# Patient Record
Sex: Female | Born: 1950
Health system: Southern US, Community
[De-identification: ages and names within clinical notes are randomized; demographics above are authoritative.]

## PROBLEM LIST (undated history)

## (undated) DIAGNOSIS — N760 Acute vaginitis: Secondary | ICD-10-CM

## (undated) DIAGNOSIS — J209 Acute bronchitis, unspecified: Secondary | ICD-10-CM

## (undated) DIAGNOSIS — K5900 Constipation, unspecified: Secondary | ICD-10-CM

## (undated) DIAGNOSIS — E785 Hyperlipidemia, unspecified: Secondary | ICD-10-CM

## (undated) DIAGNOSIS — R031 Nonspecific low blood-pressure reading: Secondary | ICD-10-CM

## (undated) DIAGNOSIS — G904 Autonomic dysreflexia: Secondary | ICD-10-CM

## (undated) DIAGNOSIS — D649 Anemia, unspecified: Secondary | ICD-10-CM

## (undated) DIAGNOSIS — E669 Obesity, unspecified: Secondary | ICD-10-CM

## (undated) DIAGNOSIS — K599 Functional intestinal disorder, unspecified: Secondary | ICD-10-CM

## (undated) DIAGNOSIS — R51 Headache: Secondary | ICD-10-CM

## (undated) DIAGNOSIS — G8250 Quadriplegia, unspecified: Secondary | ICD-10-CM

## (undated) DIAGNOSIS — Z973 Presence of spectacles and contact lenses: Secondary | ICD-10-CM

## (undated) DIAGNOSIS — H6120 Impacted cerumen, unspecified ear: Secondary | ICD-10-CM

## (undated) DIAGNOSIS — Z87442 Personal history of urinary calculi: Secondary | ICD-10-CM

## (undated) DIAGNOSIS — T8859XA Other complications of anesthesia, initial encounter: Secondary | ICD-10-CM

## (undated) DIAGNOSIS — G4733 Obstructive sleep apnea (adult) (pediatric): Secondary | ICD-10-CM

## (undated) DIAGNOSIS — N319 Neuromuscular dysfunction of bladder, unspecified: Secondary | ICD-10-CM

## (undated) DIAGNOSIS — N950 Postmenopausal bleeding: Secondary | ICD-10-CM

## (undated) DIAGNOSIS — L89159 Pressure ulcer of sacral region, unspecified stage: Secondary | ICD-10-CM

## (undated) DIAGNOSIS — M25519 Pain in unspecified shoulder: Secondary | ICD-10-CM

## (undated) DIAGNOSIS — Z8719 Personal history of other diseases of the digestive system: Secondary | ICD-10-CM

## (undated) DIAGNOSIS — I10 Essential (primary) hypertension: Secondary | ICD-10-CM

## (undated) DIAGNOSIS — M1990 Unspecified osteoarthritis, unspecified site: Secondary | ICD-10-CM

## (undated) DIAGNOSIS — Z0000 Encounter for general adult medical examination without abnormal findings: Secondary | ICD-10-CM

## (undated) DIAGNOSIS — Z8739 Personal history of other diseases of the musculoskeletal system and connective tissue: Secondary | ICD-10-CM

## (undated) DIAGNOSIS — Q288 Other specified congenital malformations of circulatory system: Secondary | ICD-10-CM

## (undated) DIAGNOSIS — T148XXA Other injury of unspecified body region, initial encounter: Secondary | ICD-10-CM

## (undated) DIAGNOSIS — C4490 Unspecified malignant neoplasm of skin, unspecified: Secondary | ICD-10-CM

## (undated) DIAGNOSIS — G8254 Quadriplegia, C5-C7 incomplete: Secondary | ICD-10-CM

## (undated) DIAGNOSIS — I341 Nonrheumatic mitral (valve) prolapse: Secondary | ICD-10-CM

## (undated) DIAGNOSIS — Z9989 Dependence on other enabling machines and devices: Secondary | ICD-10-CM

## (undated) DIAGNOSIS — F329 Major depressive disorder, single episode, unspecified: Secondary | ICD-10-CM

## (undated) DIAGNOSIS — R739 Hyperglycemia, unspecified: Secondary | ICD-10-CM

## (undated) DIAGNOSIS — T4145XA Adverse effect of unspecified anesthetic, initial encounter: Secondary | ICD-10-CM

## (undated) HISTORY — DX: Other injury of unspecified body region, initial encounter: T14.8XXA

## (undated) HISTORY — DX: Pressure ulcer of sacral region, unspecified stage: L89.159

## (undated) HISTORY — DX: Personal history of urinary calculi: Z87.442

## (undated) HISTORY — DX: Impacted cerumen, unspecified ear: H61.20

## (undated) HISTORY — DX: Functional intestinal disorder, unspecified: K59.9

## (undated) HISTORY — DX: Hyperlipidemia, unspecified: E78.5

## (undated) HISTORY — PX: TRANSFER TENDON HAND: SUR1379

## (undated) HISTORY — DX: Essential (primary) hypertension: I10

## (undated) HISTORY — DX: Pain in unspecified shoulder: M25.519

## (undated) HISTORY — PX: BLADDER STONE REMOVAL: SHX568

## (undated) HISTORY — DX: Neuromuscular dysfunction of bladder, unspecified: N31.9

## (undated) HISTORY — DX: Obesity, unspecified: E66.9

## (undated) HISTORY — DX: Encounter for general adult medical examination without abnormal findings: Z00.00

## (undated) HISTORY — DX: Major depressive disorder, single episode, unspecified: F32.9

## (undated) HISTORY — DX: Quadriplegia, C5-C7 incomplete: G82.54

## (undated) HISTORY — DX: Constipation, unspecified: K59.00

## (undated) HISTORY — DX: Acute bronchitis, unspecified: J20.9

## (undated) HISTORY — DX: Quadriplegia, unspecified: G82.50

## (undated) HISTORY — PX: ROTATOR CUFF REPAIR: SHX139

## (undated) HISTORY — DX: Acute vaginitis: N76.0

## (undated) HISTORY — DX: Headache: R51

## (undated) HISTORY — DX: Postmenopausal bleeding: N95.0

## (undated) HISTORY — DX: Unspecified malignant neoplasm of skin, unspecified: C44.90

## (undated) HISTORY — DX: Hyperglycemia, unspecified: R73.9

---

## 1979-06-14 DIAGNOSIS — Q288 Other specified congenital malformations of circulatory system: Secondary | ICD-10-CM

## 1979-06-14 DIAGNOSIS — G8250 Quadriplegia, unspecified: Secondary | ICD-10-CM

## 1979-06-14 HISTORY — DX: Quadriplegia, unspecified: G82.50

## 1979-06-14 HISTORY — PX: OTHER SURGICAL HISTORY: SHX169

## 1979-06-14 HISTORY — DX: Other specified congenital malformations of circulatory system: Q28.8

## 1979-10-23 HISTORY — PX: LAMINECTOMY: SHX219

## 1998-04-07 ENCOUNTER — Encounter: Admission: RE | Admit: 1998-04-07 | Discharge: 1998-07-06 | Payer: Self-pay

## 1998-12-22 ENCOUNTER — Encounter: Admission: RE | Admit: 1998-12-22 | Discharge: 1999-01-11 | Payer: Self-pay

## 1999-01-04 ENCOUNTER — Other Ambulatory Visit: Admission: RE | Admit: 1999-01-04 | Discharge: 1999-01-04 | Payer: Self-pay | Admitting: Urology

## 1999-01-12 ENCOUNTER — Encounter: Admission: RE | Admit: 1999-01-12 | Discharge: 1999-03-18 | Payer: Self-pay

## 1999-06-02 ENCOUNTER — Other Ambulatory Visit: Admission: RE | Admit: 1999-06-02 | Discharge: 1999-06-02 | Payer: Self-pay | Admitting: Urology

## 1999-06-14 HISTORY — PX: ROTATOR CUFF REPAIR: SHX139

## 1999-06-14 HISTORY — PX: TOTAL SHOULDER REPLACEMENT: SUR1217

## 1999-09-14 ENCOUNTER — Encounter: Admission: RE | Admit: 1999-09-14 | Discharge: 1999-10-14 | Payer: Self-pay | Admitting: Orthopedic Surgery

## 1999-12-01 ENCOUNTER — Other Ambulatory Visit: Admission: RE | Admit: 1999-12-01 | Discharge: 1999-12-01 | Payer: Self-pay | Admitting: Urology

## 2000-11-13 ENCOUNTER — Other Ambulatory Visit: Admission: RE | Admit: 2000-11-13 | Discharge: 2000-11-13 | Payer: Self-pay | Admitting: Urology

## 2001-11-16 ENCOUNTER — Other Ambulatory Visit: Admission: RE | Admit: 2001-11-16 | Discharge: 2001-11-16 | Payer: Self-pay | Admitting: Urology

## 2002-11-07 ENCOUNTER — Other Ambulatory Visit: Admission: RE | Admit: 2002-11-07 | Discharge: 2002-11-07 | Payer: Self-pay | Admitting: Urology

## 2003-10-29 ENCOUNTER — Ambulatory Visit (HOSPITAL_COMMUNITY): Admission: RE | Admit: 2003-10-29 | Discharge: 2003-10-29 | Payer: Self-pay | Admitting: Family Medicine

## 2003-11-17 ENCOUNTER — Other Ambulatory Visit: Admission: RE | Admit: 2003-11-17 | Discharge: 2003-11-17 | Payer: Self-pay | Admitting: Urology

## 2004-03-17 ENCOUNTER — Encounter: Admission: RE | Admit: 2004-03-17 | Discharge: 2004-03-17 | Payer: Self-pay | Admitting: Family Medicine

## 2004-12-15 ENCOUNTER — Other Ambulatory Visit: Admission: RE | Admit: 2004-12-15 | Discharge: 2004-12-15 | Payer: Self-pay | Admitting: Urology

## 2005-06-13 HISTORY — PX: SHOULDER ARTHROSCOPY: SHX128

## 2006-01-09 ENCOUNTER — Other Ambulatory Visit: Admission: RE | Admit: 2006-01-09 | Discharge: 2006-01-09 | Payer: Self-pay | Admitting: Urology

## 2007-01-11 ENCOUNTER — Other Ambulatory Visit: Admission: RE | Admit: 2007-01-11 | Discharge: 2007-01-11 | Payer: Self-pay | Admitting: Urology

## 2007-11-27 LAB — CONVERTED CEMR LAB: Pap Smear: NORMAL

## 2008-05-26 ENCOUNTER — Encounter: Payer: Self-pay | Admitting: Internal Medicine

## 2008-07-24 ENCOUNTER — Ambulatory Visit: Payer: Self-pay | Admitting: Internal Medicine

## 2008-07-24 DIAGNOSIS — G8254 Quadriplegia, C5-C7 incomplete: Secondary | ICD-10-CM

## 2008-07-24 HISTORY — DX: Quadriplegia, C5-C7 incomplete: G82.54

## 2008-07-27 DIAGNOSIS — E785 Hyperlipidemia, unspecified: Secondary | ICD-10-CM | POA: Insufficient documentation

## 2008-07-27 DIAGNOSIS — I10 Essential (primary) hypertension: Secondary | ICD-10-CM | POA: Insufficient documentation

## 2008-07-30 ENCOUNTER — Encounter (INDEPENDENT_AMBULATORY_CARE_PROVIDER_SITE_OTHER): Payer: Self-pay | Admitting: *Deleted

## 2008-08-14 ENCOUNTER — Ambulatory Visit: Payer: Self-pay | Admitting: Internal Medicine

## 2008-08-14 LAB — CONVERTED CEMR LAB
ALT: 38 units/L — ABNORMAL HIGH (ref 0–35)
AST: 23 units/L (ref 0–37)
Albumin: 3.6 g/dL (ref 3.5–5.2)
Alkaline Phosphatase: 94 units/L (ref 39–117)
BUN: 10 mg/dL (ref 6–23)
Basophils Absolute: 0 10*3/uL (ref 0.0–0.1)
Basophils Relative: 0.2 % (ref 0.0–3.0)
Bilirubin, Direct: 0.1 mg/dL (ref 0.0–0.3)
CO2: 31 meq/L (ref 19–32)
CRP, High Sensitivity: 17 — ABNORMAL HIGH (ref 0.00–5.00)
Calcium: 8.8 mg/dL (ref 8.4–10.5)
Chloride: 102 meq/L (ref 96–112)
Cholesterol: 186 mg/dL (ref 0–200)
Creatinine, Ser: 0.5 mg/dL (ref 0.4–1.2)
Eosinophils Absolute: 0.1 10*3/uL (ref 0.0–0.7)
Eosinophils Relative: 1.9 % (ref 0.0–5.0)
GFR calc Af Amer: 165 mL/min
GFR calc non Af Amer: 136 mL/min
Glucose, Bld: 100 mg/dL — ABNORMAL HIGH (ref 70–99)
HCT: 41.1 % (ref 36.0–46.0)
HDL: 27.4 mg/dL — ABNORMAL LOW (ref >39.0)
Hemoglobin: 14.2 g/dL (ref 12.0–15.0)
Hgb A1c MFr Bld: 5.8 % (ref 4.6–6.0)
LDL Cholesterol: 137 mg/dL — ABNORMAL HIGH (ref 0–99)
Lymphocytes Relative: 31.1 % (ref 12.0–46.0)
MCHC: 34.6 g/dL (ref 30.0–36.0)
MCV: 81.1 fL (ref 78.0–100.0)
Monocytes Absolute: 0.4 10*3/uL (ref 0.1–1.0)
Monocytes Relative: 5.8 % (ref 3.0–12.0)
Neutro Abs: 3.8 10*3/uL (ref 1.4–7.7)
Neutrophils Relative %: 61 % (ref 43.0–77.0)
Platelets: 211 10*3/uL (ref 150–400)
Potassium: 3.5 meq/L (ref 3.5–5.1)
RBC: 5.07 M/uL (ref 3.87–5.11)
RDW: 13.3 % (ref 11.5–14.6)
Sodium: 139 meq/L (ref 135–145)
TSH: 3.37 microintl units/mL (ref 0.35–5.50)
Total Bilirubin: 0.7 mg/dL (ref 0.3–1.2)
Total CHOL/HDL Ratio: 6.8
Total Protein: 7.5 g/dL (ref 6.0–8.3)
Triglycerides: 106 mg/dL (ref 0–149)
VLDL: 21 mg/dL (ref 0–40)
WBC: 6.2 10*3/uL (ref 4.5–10.5)

## 2008-08-28 ENCOUNTER — Ambulatory Visit: Payer: Self-pay | Admitting: Internal Medicine

## 2008-08-28 DIAGNOSIS — D649 Anemia, unspecified: Secondary | ICD-10-CM | POA: Insufficient documentation

## 2008-09-22 ENCOUNTER — Telehealth: Payer: Self-pay | Admitting: Internal Medicine

## 2008-09-29 ENCOUNTER — Encounter: Payer: Self-pay | Admitting: Internal Medicine

## 2008-10-13 ENCOUNTER — Telehealth: Payer: Self-pay | Admitting: Internal Medicine

## 2008-10-27 ENCOUNTER — Encounter: Admission: RE | Admit: 2008-10-27 | Discharge: 2008-12-09 | Payer: Self-pay | Admitting: Internal Medicine

## 2008-11-17 ENCOUNTER — Encounter: Payer: Self-pay | Admitting: Internal Medicine

## 2008-11-18 ENCOUNTER — Ambulatory Visit: Payer: Self-pay | Admitting: Internal Medicine

## 2008-11-18 LAB — CONVERTED CEMR LAB
ALT: 15 units/L (ref 0–35)
AST: 13 units/L (ref 0–37)
Cholesterol: 115 mg/dL (ref 0–200)
HDL: 32.6 mg/dL — ABNORMAL LOW (ref >39.00)
LDL Cholesterol: 66 mg/dL (ref 0–99)
Total CHOL/HDL Ratio: 4
Triglycerides: 81 mg/dL (ref 0.0–149.0)
VLDL: 16.2 mg/dL (ref 0.0–40.0)

## 2008-11-27 ENCOUNTER — Ambulatory Visit: Payer: Self-pay | Admitting: Internal Medicine

## 2008-12-08 ENCOUNTER — Encounter: Payer: Self-pay | Admitting: Internal Medicine

## 2008-12-08 ENCOUNTER — Telehealth: Payer: Self-pay | Admitting: Internal Medicine

## 2009-01-05 ENCOUNTER — Encounter: Payer: Self-pay | Admitting: Internal Medicine

## 2009-01-06 ENCOUNTER — Encounter: Payer: Self-pay | Admitting: Internal Medicine

## 2009-01-09 ENCOUNTER — Encounter: Payer: Self-pay | Admitting: Internal Medicine

## 2009-01-22 ENCOUNTER — Ambulatory Visit: Payer: Self-pay | Admitting: Internal Medicine

## 2009-01-29 ENCOUNTER — Telehealth: Payer: Self-pay | Admitting: Internal Medicine

## 2009-02-09 ENCOUNTER — Encounter: Payer: Self-pay | Admitting: Internal Medicine

## 2009-04-27 ENCOUNTER — Telehealth: Payer: Self-pay | Admitting: Internal Medicine

## 2009-05-22 ENCOUNTER — Ambulatory Visit: Payer: Self-pay | Admitting: Internal Medicine

## 2009-05-22 LAB — CONVERTED CEMR LAB
BUN: 8 mg/dL (ref 6–23)
CO2: 31 meq/L (ref 19–32)
Calcium: 9.3 mg/dL (ref 8.4–10.5)
Chloride: 103 meq/L (ref 96–112)
Creatinine, Ser: 0.5 mg/dL (ref 0.4–1.2)
GFR calc non Af Amer: 135.38 mL/min (ref >60)
Glucose, Bld: 98 mg/dL (ref 70–99)
Potassium: 4.1 meq/L (ref 3.5–5.1)
Sodium: 141 meq/L (ref 135–145)

## 2009-05-29 ENCOUNTER — Ambulatory Visit: Payer: Self-pay | Admitting: Diagnostic Radiology

## 2009-05-29 ENCOUNTER — Ambulatory Visit: Payer: Self-pay | Admitting: Internal Medicine

## 2009-05-29 ENCOUNTER — Ambulatory Visit (HOSPITAL_BASED_OUTPATIENT_CLINIC_OR_DEPARTMENT_OTHER): Admission: RE | Admit: 2009-05-29 | Discharge: 2009-05-29 | Payer: Self-pay | Admitting: Internal Medicine

## 2009-05-29 DIAGNOSIS — M25559 Pain in unspecified hip: Secondary | ICD-10-CM | POA: Insufficient documentation

## 2009-06-05 ENCOUNTER — Telehealth: Payer: Self-pay | Admitting: Internal Medicine

## 2009-06-10 ENCOUNTER — Telehealth: Payer: Self-pay | Admitting: *Deleted

## 2009-06-13 HISTORY — PX: OTHER SURGICAL HISTORY: SHX169

## 2009-06-17 ENCOUNTER — Telehealth: Payer: Self-pay | Admitting: Internal Medicine

## 2009-06-22 ENCOUNTER — Telehealth: Payer: Self-pay | Admitting: Internal Medicine

## 2009-06-25 ENCOUNTER — Telehealth: Payer: Self-pay | Admitting: Internal Medicine

## 2009-06-25 ENCOUNTER — Ambulatory Visit: Payer: Self-pay | Admitting: Internal Medicine

## 2009-06-25 LAB — CONVERTED CEMR LAB: Total CK: 28 units/L (ref 7–177)

## 2009-08-04 ENCOUNTER — Ambulatory Visit: Payer: Self-pay | Admitting: Internal Medicine

## 2009-08-04 ENCOUNTER — Telehealth: Payer: Self-pay | Admitting: Internal Medicine

## 2009-08-04 DIAGNOSIS — G4700 Insomnia, unspecified: Secondary | ICD-10-CM | POA: Insufficient documentation

## 2009-10-06 ENCOUNTER — Ambulatory Visit: Payer: Self-pay | Admitting: Internal Medicine

## 2009-10-06 DIAGNOSIS — L659 Nonscarring hair loss, unspecified: Secondary | ICD-10-CM | POA: Insufficient documentation

## 2009-10-07 ENCOUNTER — Telehealth (INDEPENDENT_AMBULATORY_CARE_PROVIDER_SITE_OTHER): Payer: Self-pay | Admitting: *Deleted

## 2009-10-13 ENCOUNTER — Telehealth: Payer: Self-pay | Admitting: Internal Medicine

## 2009-10-30 ENCOUNTER — Ambulatory Visit: Payer: Self-pay | Admitting: Internal Medicine

## 2009-10-30 DIAGNOSIS — L259 Unspecified contact dermatitis, unspecified cause: Secondary | ICD-10-CM | POA: Insufficient documentation

## 2009-10-30 DIAGNOSIS — L84 Corns and callosities: Secondary | ICD-10-CM | POA: Insufficient documentation

## 2009-11-12 ENCOUNTER — Encounter: Payer: Self-pay | Admitting: Internal Medicine

## 2009-11-16 ENCOUNTER — Telehealth: Payer: Self-pay | Admitting: Internal Medicine

## 2009-11-25 ENCOUNTER — Telehealth: Payer: Self-pay | Admitting: Internal Medicine

## 2009-11-27 ENCOUNTER — Encounter: Payer: Self-pay | Admitting: Internal Medicine

## 2010-01-11 ENCOUNTER — Encounter: Payer: Self-pay | Admitting: Internal Medicine

## 2010-01-11 LAB — HM MAMMOGRAPHY: HM Mammogram: NORMAL

## 2010-01-12 ENCOUNTER — Encounter: Payer: Self-pay | Admitting: Internal Medicine

## 2010-03-19 ENCOUNTER — Encounter: Payer: Self-pay | Admitting: Internal Medicine

## 2010-04-01 ENCOUNTER — Ambulatory Visit: Payer: Self-pay | Admitting: Internal Medicine

## 2010-04-29 ENCOUNTER — Telehealth: Payer: Self-pay | Admitting: Internal Medicine

## 2010-06-13 HISTORY — PX: HERNIA REPAIR: SHX51

## 2010-06-29 ENCOUNTER — Telehealth: Payer: Self-pay | Admitting: Internal Medicine

## 2010-07-09 ENCOUNTER — Encounter: Payer: Self-pay | Admitting: Internal Medicine

## 2010-07-13 NOTE — Assessment & Plan Note (Signed)
Summary: 2 month follow up/mhf   Vital Signs:  Patient profile:   60 year old [name] Temp:     Alexandra.1 degrees F oral Pulse rate:   70 / minute Resp:     18 per minute BP sitting:   139 / 87  (right arm) Cuff size:   large  Vitals Entered By: Glendell Docker CMA (October 06, 2009 2:23 PM) CC: Rm 3- 2 Month Follow up disease management Is Patient Diabetic? No   Primary Care Provider:  Dondra Spry DO  CC:  Rm 3- 2 Month Follow up disease management.  History of Present Illness: 60 y/o [name] with hx of quadriplegia for follow up. overall doing well. hip pain has improved some she occ uses higher dose of baclofen  int hx: seen by derm.  Dr. Yetta Barre  biopsy of scalp for alopecia done last week 09/22/2009 and she was diagnosed with lichen planopilaris.  follow up was 09/29/2009-treament is clobetasol.   she has been able to maintain wt loss  Allergies: 1)  ! 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  [employer] Sister is primary caregiver Single  Never Smoked Alcohol use-no         Physical Exam  General:  alert, well-developed, and well-nourished.   Lungs:  normal respiratory effort and normal breath sounds.   Heart:  normal rate, regular rhythm, and no gallop.   Extremities:  trace left pedal edema and trace right pedal edema.   Skin:  large patch of focal hair loss.  posterior scalp   Impression & Recommendations:  Problem # 1:  HIP PAIN, LEFT  (ICD-719.45) Assessment Improved still gets intermittent hip pain.  I suggested sister help with leg stretches.  take higher dose of baclofen as needed Her updated medication list for this problem includes:    Baclofen 20 Mg Tabs (Baclofen) .Marland Kitchen... 1/2 to 1  tablet by mouth three times a day    Hydrocodone-acetaminophen 5-500 Mg Tabs (Hydrocodone-acetaminophen) .Marland Kitchen... 1-2 every once daily as needed  Problem # 2:  ALOPECIA (ICD-704.00) Pt diagnosed with lichen planopilaris.  hair loss isolated to left side when her head contacts pillow.  I suggested switching pillows (hypoallergenic).  she has had same foam pillow x 20 yrs  Medications Added to Medication List This Visit: 1)  Clobetasol Propionate 0.05 % Soln (Clobetasol propionate) .... Apply to affected area  once daily  Complete Medication List: 1)  Peg 3350/electrolytes 240 Gm Solr (Peg 3350-kcl-nabcb-nacl-nasulf) .... 2/3 cup  every morning mix with 5 ounces of water take by mouth  once daily 2)  Furosemide 20 Mg Tabs (Furosemide) .... Take 1 tablet by mouth two times a day 3)  Baclofen 20 Mg Tabs (Baclofen) .... 1/2 to 1  tablet by mouth three times a day 4)  Potassium Chloride Crys Cr 20 Meq Cr-tabs (Potassium chloride crys cr) .... 2 tablets by mouth two times a day 5)  Oxybutynin Chloride  5 Mg Tabs (Oxybutynin chloride) .... Take 1 tablet by mouth every morning 6)  One-a-day Womens Formula Tabs (Multiple vitamins-calcium) .... Take 1 tablet by mouth once a day 7)  Fergon 240 (27 Fe) Mg Tabs (Ferrous gluconate) .... Take 1 tablet by mouth every morning 8)  Diazepam 5 Mg Tabs (Diazepam) .... 1/2 tablet by mouth at bedtime as needed 9)  Hydrocodone-acetaminophen 5-500 Mg Tabs (Hydrocodone-acetaminophen) .Marland Kitchen.. 1-2 every once daily as needed 10)  Pravastatin Sodium 40 Mg Tabs (Pravastatin sodium) .... One tablet by mouth every evening 11)  Clobetasol Propionate 0.05 % Soln (Clobetasol propionate) .... Apply to affected area  once  daily  Patient Instructions: 1)  Please schedule a follow-up appointment in 1 year. 2)  BMP prior to visit, ICD-9:  401.9 3)  Hepatic Panel prior to visit, ICD-9:  272.4 4)  Lipid Panel prior to visit, ICD-9:  272.4 5)  TSH prior to visit, ICD-9:  272.4 6)  Please return for lab work one (1) week before your next appointment.  Prescriptions: PRAVASTATIN SODIUM 40 MG TABS (PRAVASTATIN SODIUM) one tablet by mouth every evening  #90 x 3   Entered and Authorized by:   D. Thomos Lemons DO   Signed by:   D. Thomos Lemons DO on 10/06/2009   Method used:   Print then Give to Patient   RxID:   [id number] OXYBUTYNIN CHLORIDE 5 MG TABS (OXYBUTYNIN CHLORIDE) Take 1 tablet by mouth every morning  #90 x 3   Entered and Authorized by:   D. Thomos Lemons DO   Signed by:   D. Thomos Lemons DO on 10/06/2009   Method used:   Print then Give to Patient   RxID:   [id number] POTASSIUM CHLORIDE CRYS CR 20 MEQ CR-TABS (POTASSIUM CHLORIDE CRYS CR) 2 tablets by mouth two times a day  #120 x 3   Entered and Authorized by:   D. Thomos Lemons DO   Signed by:   D. Thomos Lemons DO on 10/06/2009   Method used:   Print then Give to Patient   RxID:   [id number] BACLOFEN 20 MG TABS (BACLOFEN) 1/2 to 1  tablet by mouth three times a day  #90 x 3   Entered and Authorized by:   D. Thomos Lemons DO   Signed by:   D. Thomos Lemons DO on 10/06/2009   Method used:   Print then Give to Patient   RxID:   [id number] FUROSEMIDE 20 MG TABS (FUROSEMIDE) Take 1 tablet by mouth two times a day  #180 x 3   Entered and Authorized by:   D. Thomos Lemons DO   Signed by:   D. Thomos Lemons DO on 10/06/2009   Method used:   Print then Give to Patient   RxID:   [id number]   Current Allergies (reviewed today): ! * BANDAGE ADHESIVE

## 2010-07-13 NOTE — Progress Notes (Signed)
Summary: Medication Refill  Phone Note Refill Request Message from:  Fax from Pharmacy on August 04, 2009 4:46 PM  Refills Requested: Medication #1:  FUROSEMIDE 20 MG TABS Take 1 tablet by mouth two times a day  Medication #2:  POTASSIUM CHLORIDE CRYS CR 20 MEQ CR-TABS 2 tablets by mouth two times a day  Medication #3:  DIAZEPAM 5 MG TABS 1/2 tablet by mouth at bedtime as needed please refill to [facility]   Initial call taken by: Michaelle Copas,  August 04, 2009 4:46 PM  Follow-up for Phone Call        ok to refill x 5 Follow-up by: D. Thomos Lemons DO,  August 04, 2009 4:50 PM  Additional Follow-up for Phone Call Additional follow up Details #1::        Rx called to pharmacy Additional Follow-up by: Glendell Docker CMA,  August 04, 2009 5:05 PM    Prescriptions: DIAZEPAM 5 MG TABS (DIAZEPAM) 1/2 tablet by mouth at bedtime as needed  #15 x 5   Entered by:   Glendell Docker CMA   Authorized by:   D. Thomos Lemons DO   Signed by:   Glendell Docker CMA on 08/04/2009   Method used:   Telephoned to ...       [facility]* (retail)       [address] [place], [place]  [address]       Ph: [phone] or [phone]       Fax: [phone]   RxID:   [id number] POTASSIUM CHLORIDE CRYS CR 20 MEQ CR-TABS (POTASSIUM CHLORIDE CRYS CR) 2 tablets by mouth two times a day  #120 x 5   Entered by:   Glendell Docker CMA   Authorized by:   D. Thomos Lemons DO   Signed by:   Glendell Docker CMA on 08/04/2009   Method used:   Telephoned to ...       [facility]* (retail)       [address] [place], [place]  [address]       Ph: [phone] or [phone]       Fax: [phone]   RxID:   [id number] FUROSEMIDE 20 MG TABS (FUROSEMIDE) Take 1 tablet by mouth two times a day  #180 x 3   Entered by:   Glendell Docker CMA   Authorized by:   D. Thomos Lemons DO   Signed by:   Glendell Docker CMA on 08/04/2009   Method used:   Telephoned to ...       [facility]* (retail)       [address] [place], [place]  [address]       Ph: [phone] or [phone]       Fax: [phone]   RxID:   [id number]

## 2010-07-13 NOTE — Assessment & Plan Note (Signed)
Summary: new to be est.   Vital Signs:  Patient Profile:   60 Years Old Female Temp:     97.8 degrees F oral Pulse rate:   64 / minute Pulse rhythm:   regular Resp:     16 per minute BP sitting:   140 / 100  (right arm) Cuff size:   large  Vitals Entered By: Glendell Docker CMA (July 24, 2008 3:02 PM)             Is Patient Diabetic? No     PCP:  Dondra Spry DO  Chief Complaint:  New Patient .  History of Present Illness: 60 y/o white female with hx of quadriplegia secondary to ruptured AVM in C spine (1981) to establish.   Pt previously followed by Dr. Duaine Dredge.   She has assoc neurogenic bladder and bowel dysfunction.   She was initially cared for by her mother but her sister took over care in 1985-1986.  She has done remarkably well.    She is concerned about stress her sister is under.   Pt's weight is making day to day more difficult.   She denies pressure sores.   She is concerned about progressive weight gain.   She does not follow specific diet.  She is at higher risk for CAD due to quadriplegia.   She was previously prescribed simvastatin but had to stop due to loose stools.   She denies hx of CAD or DM.  We reviewed her current diet.  She does not limit her calories.    Hypertension       This is a 60 year old woman who presents for Hypertensionmgt.  The patient denies lightheadedness and headaches.  The patient denies the following associated symptoms: chest pain.  Compliance with medications (by patient report) has been near 100%.  She does not monitor BP at home.  Medical records reviewed.    Current Allergies (reviewed today): ! 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    Neurogenic bladder    Bowel dysfuntion - constipation and fecal retention    Hx of bladder stones    Mulitple shoulder surgeries - limited ROM of both shoulders    Hyperlipidemia    Hypertension    Obesity  Past Surgical  History:    Laminectomy - AVM 1981    Tendon transfer to right hand 1982-1983    Left shoulder rotator cuff surgery 1996, 1998    Left shoulder replacement 2001    Right shoulder arthroscopy 2008   Family History:    Family History of CAD Female 1st degree relative  (Father had first MI in his early 58's)  He was smoker    Breast cancer - mother (radiation, no chemo)    Colon ca - no  Social History:    Occupation:- Disabled Editor, commissioning.  [employer]    Single    Never Smoked    Alcohol use-no       Risk Factors:  Tobacco use:  never Drug use:  no Caffeine use:  0 drinks per day Alcohol use:  no Exercise:  no  Mammogram History:     Date of Last Mammogram:  11/27/2007    Results:  normal   PAP Smear History:     Date of Last PAP Smear:  11/27/2007    Results:  normal    Review of Systems  The patient complains of weight gain.  The patient denies fever, chest pain, prolonged cough, headaches, abdominal pain, severe indigestion/heartburn, and depression.         All other systems negative.    Physical Exam  General:     alert and overweight-appearing.   Head:     normocephalic and no abnormalities observed.   Eyes:     vision grossly intact, pupils equal, pupils round, and pupils reactive to light.   Ears:     R ear normal and L ear normal.   Mouth:     Oral mucosa and oropharynx without lesions or exudates.  Teeth in good repair. Neck:     supple, no masses, and no carotid bruits.   Lungs:     normal respiratory effort and normal breath sounds.   Heart:     normal rate, regular rhythm, no murmur, and no gallop.   Abdomen:     soft, non-tender, no hepatomegaly, and no splenomegaly.   Msk:     limited ROM of bilateral shoulders Extremities:     trace left pedal edema and trace right pedal edema.   Neurologic:     cranial nerves II-XII intact.   quadriplegia -  she is able operate powered wheel chair    Impression &  Recommendations:  Problem # 1:  QUADRIPLEGIA (ICD-344.00) Hx of c spine ruptured AVM in 1981.   She has neurogenic bladder and bowel dysfunction.  Her sister is having increasing difficulty caring for pt due to increasing weight.   She has difficult daily bowel regimen.    Consider referral to tertiary center to see if sacral nerve stimulator can improve fecal retention.  Arrange home care eval.  Her sister having difficulty managing day to day needs.  Sister also appears to have care giver burn out.    Orders: Misc. Referral (Misc. Ref)   Problem # 2:  HYPERTENSION (ICD-401.9) BP suboptimal.  I advised pt monitor BP at home.   Consider add additional agent (ACE ). Her updated medication list for this problem includes:    Furosemide 40 Mg Tabs (Furosemide) .Marland Kitchen... Take 1 tablet by mouth two times a day  BP today: 140/100   Problem # 3:  HYPERLIPIDEMIA (ICD-272.4) Pt previously prescribed simvastatin but could not tolerate due to loose stools.    Pt is higher risk due to quadriplegia.   We discussed change to pravastatin.   Problem # 4:  OBESITY (ICD-278.00) We discussed dietary strategies.  Complete Medication List: 1)  Colyte 240 Gm Solr (Peg 3350-kcl-nabcb-nacl-nasulf) .... 2/3 cup for bowel program 2)  Furosemide 40 Mg Tabs (Furosemide) .... Take 1 tablet by mouth two times a day 3)  Baclofen 20 Mg Tabs (Baclofen) .... 1/2 tablet by mouth three times a day 4)  Potassium Chloride Crys Cr 20 Meq Cr-tabs (Potassium chloride crys cr) .... 2 tablets by mouth two times a day 5)  Oxybutynin Chloride 5 Mg Tabs (Oxybutynin chloride) .... Take 1 tablet by mouth every morning 6)  One-a-day Womens Formula Tabs (Multiple vitamins-calcium) .... Take 1 tablet by mouth once a day 7)  Fergon 240 (27 Fe) Mg Tabs (Ferrous gluconate) .... Take 1 tablet by mouth every morning 8)  Diazepam 5 Mg Tabs (Diazepam) .... 1/2 tablet by mouth at bedtime as needed 9)  Hydrocodone-acetaminophen 5-500 Mg Tabs  (Hydrocodone-acetaminophen) .Marland Kitchen.. 1-2 every 4-6 hours as needed pain  Other Orders: Influenza A (H1N1)  (U0454) Influenza virus vaccine- pandemic formulation (H1N1) (09811)  Patient Instructions: 1)  Please schedule a follow-up appointment in 1 month. 2)  BMP prior to visit, ICD-9:  272.4 3)  Hepatic Panel prior to visit, ICD-9: 272.4 4)  Lipid Panel prior to visit, ICD-9: 272.4 5)  TSH prior to visit, ICD-9: 272.4 6)  CBC w/ Diff prior to visit, ICD-9: 272.4 7)  HbgA1C prior to visit, ICD-9: 272.4 8)  Please return for lab work one (1) week before your next appointment.  9)  Limit your carbohydrate intake to 20-25 gm per meal.   Increase lean protein intake in AM. 10)  Avoid concentrated sweets. 11)  Lunch - use low sodium meats and use one slice of bread. 12)  Dinner - lean protein and 2 vegetables.    Current Allergies (reviewed today): ! * BANDAGE ADHESIVE   Preventive Care Screening  Last Flu Shot:    Date:  06/03/2008    Results:  given   Last Tetanus Booster:    Date:  06/03/2008    Results:  given   Mammogram:    Date:  11/27/2007    Results:  normal   Pap Smear:    Date:  11/27/2007    Results:  normal   Last Pneumovax:    Date:  12/07/2005    Results:  given   Bone Density:    Date:  10/17/2003    Results:  normal std dev    H1N1 # 1    Vaccine Type: Influenza virus vaccine- pandemic formulation (H1N1)    Site: right deltoid    Mfr: NOVARTIS    Dose: 0.5 ml    Route: IM    Given by: Glendell Docker CMA    Exp. Date: 10/10/2008    Lot #: [id number]    VIS given: 03/13/2009 given July 24, 2008.  Vaccine Consent Questions    Do you have a history of severe allergic reactions to this vaccine? no    Any prior history of allergic reactions to egg and/or gelatin? no    Do you currently have an acute febrile illness? no    Have you ever had a severe reaction to latex? no    Patient is moderately or severely ill? no    Vaccine information given  and explained to patient? yes    Are you currently pregnant? no

## 2010-07-13 NOTE — Progress Notes (Signed)
Summary: Iron Supplement  Phone Note Call from Patient Call back at Home Phone [phone]   Caller: Patient Call For: D. Thomos Lemons DO Summary of Call: patient called and left voice message requesting a return call regarding her Iron, she states her pharmacy informed her the manufacturer will no longer be carrying what she takes and would like to know what Dr Artist Pais advises. Initial call taken by: Glendell Docker CMA,  April 29, 2010 5:01 PM  Follow-up for Phone Call        stop taking iron supplement Follow-up by: D. Thomos Lemons DO,  April 30, 2010 4:30 PM  Additional Follow-up for Phone Call Additional follow up Details #1::        Pt notified and voices understanding. Nicki Guadalajara Fergerson CMA Duncan Dull)  April 30, 2010 4:56 PM

## 2010-07-13 NOTE — Assessment & Plan Note (Signed)
Summary: rash on top of foot/mhf   Vital Signs:  Patient profile:   60 year old female O2 Sat:      97 % on Room air Pulse rate:   67 / minute Pulse rhythm:   regular BP sitting:   130 / 80  (right arm) Cuff size:   large  Vitals Entered By: Glendell Docker CMA (Oct 30, 2009 2:06 PM)  O2 Flow:  Room air CC: Rm  3- Evaluation of Rash on top of feet  Does patient need assistance? Functional Status Social activities Ambulation Wheelchair   Primary Care Provider:  D. Thomos Lemons DO  CC:  Rm  3- Evaluation of Rash on top of feet.  History of Present Illness:  60 y/o white female with hx of quadriplegia c/o rash top of foot.  no fever or chills. similar rash in the past. Dr. Duaine Dredge thought it may be cellulitus.  she wears shoes that do not breathe well  Allergies: 1)  ! 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  [employer] Sister is primary caregiver Single  Never Smoked Alcohol use-no          Physical Exam  General:  alert, well-developed, and well-nourished.   Lungs:  normal respiratory effort and normal breath sounds.   Heart:  normal rate, regular rhythm, and no gallop.   Msk:  small callus right medial foot.  hyperesthesia Skin:  macular rash top of foot   Impression & Recommendations:  Problem # 1:  DERMATITIS (ICD-692.9) I suspect contact dermatitis.  trial of topical lotrisone.  we discussed signs of cellulitis.  rx for keflex  provided.  Problem # 2:  CALLUS, RIGHT FOOT (ICD-700) hx of quadriplegia.  she reports hyperesthesia over right foot.  right callus vs wart has caused irritation for years but too afraid of having surgery.  she would like at least consultation to review tx options.  Orders: Orthopedic Referral (Ortho)  Complete Medication List: 1)  Peg 3350/electrolytes 240 Gm Solr (Peg 3350-kcl-nabcb-nacl-nasulf) .... 5 ounces by mouth once daily as directed 2)  Furosemide 20 Mg Tabs (Furosemide) .... Take 1 tablet by mouth two times a day 3)  Baclofen 20 Mg Tabs (Baclofen) .... 1/2 to 1  tablet by mouth three times a day 4)  Potassium Chloride Crys Cr 20 Meq Cr-tabs (Potassium chloride crys cr) .... 2 tablets by mouth two times a day 5)  Oxybutynin Chloride 5 Mg Tabs (Oxybutynin chloride) .... Take 1 tablet by mouth every morning 6)  One-a-day Womens Formula Tabs (Multiple vitamins-calcium) .... Take 1 tablet by mouth once a day 7)  Fergon 240 (27 Fe) Mg Tabs (Ferrous gluconate) .... Take 1 tablet by mouth every morning 8)  Diazepam 5 Mg Tabs (Diazepam) .... 1/2 tablet by mouth at bedtime as needed 9)  Hydrocodone-acetaminophen 5-500 Mg Tabs (Hydrocodone-acetaminophen) .Marland Kitchen.. 1-2 every once daily as needed 10)  Pravastatin Sodium 40 Mg Tabs (Pravastatin  sodium) .... One tablet by mouth every evening 11)  Clotrimazole-betamethasone 1-0.05 % Crea (Clotrimazole-betamethasone) .... Apply two times a day x 2 weeks 12)  Cephalexin 500 Mg Caps (Cephalexin) .... One by mouth two times a day  Patient Instructions: 1)  Call our office if your symptoms do not  improve or gets worse. 2)  Our office will contact you re:  referral to Dr. Victorino Dike Prescriptions: CEPHALEXIN 500 MG CAPS (CEPHALEXIN) one by mouth two times a day  #14 x 0   Entered and Authorized by:   D. Thomos Lemons DO   Signed by:   D. Thomos Lemons DO on 10/30/2009   Method used:   Print then Give to Patient   RxID:    [id number] CLOTRIMAZOLE-BETAMETHASONE 1-0.05 % CREA (CLOTRIMAZOLE-BETAMETHASONE) apply two times a day x 2 weeks  #30 grams x 1   Entered and Authorized by:   D. Thomos Lemons DO   Signed by:   D. Thomos Lemons DO on 10/30/2009   Method used:   Electronically to        [facility] (retail)       [address]       [place], [place]  [address]       Ph: [phone]       Fax: [phone]   RxID:   [id number]   Current Allergies (reviewed today): ! * BANDAGE ADHESIVE

## 2010-07-13 NOTE — Progress Notes (Signed)
Summary: Leg pain  Phone Note Call from Patient Call back at Home Phone [phone]   Caller: Patient Summary of Call: patient called and left voice message stating she is still having unresolved leg pain. Her muscles in her left leg continues to hurt. She would like to know if is it could be related to her cholesterol medication. Initial call taken by: Glendell Docker CMA,  June 22, 2009 3:42 PM  Follow-up for Phone Call        unlikely but possible.  have pt come in for CPK - use hip pain.  she can hold cholesterol medication x 2 weeks to see if pain gets better Follow-up by: D. Thomos Lemons DO,  June 22, 2009 4:27 PM  Additional Follow-up for Phone Call Additional follow up Details #1::        patient advised per Dr Artist Pais instructions.She states she will go to the [place] location for the blood work Additional Follow-up by: Glendell Docker CMA,  June 22, 2009 4:47 PM

## 2010-07-13 NOTE — Letter (Signed)
Summary: Wheelchair Order/[facility]  Wheelchair Order/[facility]   Imported By: Lanelle Bal 01/14/2009 13:51:43  _____________________________________________________________________  External Attachment:    Type:   Image     Comment:   External Document

## 2010-07-13 NOTE — Progress Notes (Signed)
Summary: refill req  Phone Note Refill Request Message from:  Fax from Pharmacy on June 10, 2009 3:14 PM  Refills Requested: Medication #1:  colyte with flavor p sol ml 8000   Dosage confirmed as above?Dosage Confirmed   Brand Name Necessary? No   Supply Requested: 1 month  Method Requested: Electronic Next Appointment Scheduled: 08-04-09 215 Dr Artist Pais Initial call taken by: Roselle Locus,  June 10, 2009 3:15 PM    Prescriptions: COLYTE 240 GM SOLR (PEG 3350-KCL-NABCB-NACL-NASULF) 2/3 cup for bowel program  #8000 x 1   Entered by:   Romualdo Bolk, CMA (AAMA)   Authorized by:   D. Thomos Lemons DO   Signed by:   Romualdo Bolk, CMA (AAMA) on 06/11/2009   Method used:   Electronically to        [facility]* (retail)       [address] [place], [place]  [address]       Ph: [phone] or [phone]       Fax: [phone]   RxID:   [id number]

## 2010-07-13 NOTE — Miscellaneous (Signed)
Summary: Mammogram  Clinical Lists Changes  Observations: Added new observation of MAMMOGRAM: normal (01/05/2009 10:57)      Preventive Care Screening  Mammogram:    Date:  01/05/2009    Results:  normal

## 2010-07-13 NOTE — Assessment & Plan Note (Signed)
Summary: 2 MONTH FOLLOW UP/MHF   Vital Signs:  Patient profile:   60 year old female O2 Sat:      99 % on Room air Temp:     98.2 degrees F oral Pulse rate:   59 / minute Pulse rhythm:   regular Resp:     16 per minute BP sitting:   140 / 80  (left arm) Cuff size:   large  Vitals Entered By: Glendell Docker CMA (August 04, 2009 2:17 PM)  O2 Flow:  Room air CC: 2 Month Follow up  Is Patient Diabetic? No Comments 2 Month follow up , c/ o unresolvedleft leg cramping, Refill on all medications   Primary Care Provider:  Dondra Spry DO  CC:  2 Month Follow up .  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  she c/o less of hip pain but reports tight sensation of left leg.  symptoms still worse as night.  she also c/o insomnia over last few months.  no change in habits  denies mood changes  Preventive Screening-Counseling & Management  Alcohol-Tobacco     Smoking Status: never  Allergies: 1)  ! * Bandage Adhesive  Past History:  Past Surgical History: Laminectomy - AVM 1981 Tendon transfer to right hand 1982-1983 Left shoulder rotator cuff surgery 1996, 1998 Left shoulder replacement 2001   Right shoulder arthroscopy 2008   Family History: Family History of CAD Female 1st degree relative  (Father had first MI in his early 63's)  He was smoker Breast cancer - mother (radiation, no chemo) Colon ca - no         Social History: Occupation:- Disabled Editor, commissioning.  [employer] Sister is primary caregiver Single Never Smoked Alcohol use-no         Physical Exam  General:  alert, well-developed, and well-nourished.   Lungs:  normal respiratory effort and normal breath sounds.   Heart:  normal rate, regular rhythm, and no gallop.   Msk:  no leg or hip tenderness.  mild increase in muscle tone of left leg compared to right.   Impression &  Recommendations:  Problem # 1:  HIP PAIN, LEFT (ICD-719.45) Pt feels like left leg is tight.  prev x ray is normal.  she has some spacticity.  increase baclofen dose at bedtime. If no improvement, consider MRI of L Spine   Her updated medication list for this problem includes:    Baclofen 20 Mg Tabs (Baclofen) .Marland Kitchen... 1/2 to 1  tablet by mouth three times a day    Hydrocodone-acetaminophen 5-500 Mg Tabs (Hydrocodone-acetaminophen) .Marland Kitchen... 1-2 every once daily as needed  Problem # 2:  INSOMNIA (ICD-780.52) Pt with intermittent insomnia.  we discussed sleep hygiene changes.  I encourage exposure to direct sun light for 15-20 mins. higher dose of baclofen may also help with help with sleep  Complete Medication List: 1)  Peg 3350/electrolytes 240 Gm Solr (Peg 3350-kcl-nabcb-nacl-nasulf) .... 2/3 cup  every morning mix with 5 ounces of water take by mouth  once daily 2)  Furosemide 20 Mg Tabs (Furosemide) .... Take 1 tablet by mouth two times a day 3)  Baclofen 20 Mg Tabs (Baclofen) .... 1/2 to 1  tablet by mouth three times a day 4)  Potassium Chloride Crys Cr 20 Meq Cr-tabs (Potassium chloride crys cr) .... 2 tablets by mouth two times a day 5)  Oxybutynin Chloride 5 Mg Tabs (Oxybutynin chloride) .... Take 1 tablet by mouth every morning 6)  One-a-day Womens Formula Tabs (Multiple vitamins-calcium) .... Take 1 tablet by mouth once a day 7)  Fergon 240 (27 Fe) Mg Tabs (Ferrous gluconate) .... Take 1 tablet by mouth every morning 8)  Diazepam 5 Mg Tabs (Diazepam) .... 1/2 tablet by mouth at bedtime as needed 9)  Hydrocodone-acetaminophen 5-500 Mg Tabs (Hydrocodone-acetaminophen) .Marland Kitchen.. 1-2 every once daily as needed 10)  Pravastatin Sodium 40 Mg Tabs (Pravastatin sodium) .... One by mouth qpm 11)  Clobetasol Propionate 0.05 % Soln (Clobetasol propionate) .... Apply to affected area two times a day as needed  Patient Instructions: 1)  Please schedule a follow-up appointment in 2  months. Prescriptions: HYDROCODONE-ACETAMINOPHEN 5-500 MG TABS (HYDROCODONE-ACETAMINOPHEN) 1-2 every once daily as needed  #60 x 0   Entered and Authorized by:   D. Thomos Lemons DO   Signed by:   D. Thomos Lemons DO on 08/04/2009   Method used:   Print then Give to Patient   RxID:   [id number] BACLOFEN 20 MG TABS (BACLOFEN) 1/2 to 1  tablet by mouth three times a day  #90 x 3   Entered and Authorized by:   D. Thomos Lemons DO   Signed by:   D. Thomos Lemons DO on 08/04/2009   Method used:   Electronically to        [facility]* (retail)       [address] [place], [place]  [address]       Ph: [phone] or [phone]       Fax: [phone]   RxID:   [id number]

## 2010-07-13 NOTE — Progress Notes (Signed)
  Phone Note Other Incoming   Summary of Call: Received letter from pt re: need for new powered wheel chair.   Please set up PT referral Initial call taken by: D. Thomos Lemons DO,  September 22, 2008 12:55 PM  Follow-up for Phone Call        Request fax  to [facility]     Follow up    fax rec'd   Follow-up by: Darral Dash,  September 25, 2008 9:36 AM

## 2010-07-13 NOTE — Progress Notes (Signed)
Summary: 3350 PEG FYI  Phone Note Outgoing Call   Call placed by: Mervin Kung, CMA Call placed to: Patient Summary of Call: I called pt to clarify which bowel prep she has used in the past. Pt stated she has used Colyte but her insurance no longer covers it.  She states she picked up the 3350 PEG that we sent in. The pharmacy said this is a generic equivalent for colyte.  Pt. wanted to know why it costed more this time and it was due to the quantity we sent in. Pharmacy states they gave her 4 jugs this time and have been giving her 2.  Mervin Kung CMA  Oct 13, 2009 2:59 PM   Follow-up for Phone Call        ok to change to 2 jugs if pt wishes Follow-up by: D. Thomos Lemons DO,  Oct 13, 2009 5:35 PM  Additional Follow-up for Phone Call Additional follow up Details #1::        Called pharmacist to change quantity to 2 jugs per pt. as I was told this would be a cheaper copay for the pt.  The pharmacist today told me that it is actually cheaper for the pt. to get 4 jugs for $30 copay instead 2 jugs @ $40.  She states they had been undercharging the pt. in the past.  Advised pt. per pharmacy instructions and asked pt. to contact pharmacy if she has any questions.  Mervin Kung CMA  Oct 14, 2009 9:47 AM

## 2010-07-13 NOTE — Progress Notes (Signed)
Summary: Pre-Op  Phone Note Call from Patient Call back at [phone]   Caller: Patient Call For: D. Thomos Lemons DO Complaint: Urinary/GYN Problems Summary of Call: patient called and left voice message stating she was seen by Dr Artist Pais and at her last office visit she discussed a problem she was having with her foot. She states she has since seen Dr Victorino Dike about the bone on her foot and he advises surgery. Her message states she will need surgical clearance from Dr Artist Pais and would like to know if Dr Artist Pais could send clearance , or if  she will need to return for the surgical clearance Initial call taken by: Glendell Docker CMA,  November 16, 2009 11:22 AM  Follow-up for Phone Call        call was returned to patient, and she states that she has mitral valve prolapse. She mentioned that she does not remember discussing this with Dr Artist Pais, but she has not had any problems with it in the past. She also states that has been given antibiotics in the past for dental procedures, but has no longer had the need to have that done Follow-up by: Glendell Docker CMA,  November 16, 2009 11:27 AM  Additional Follow-up for Phone Call Additional follow up Details #1::        she can proceed with surgery. does dr. Victorino Dike want pre op form completed?  Additional Follow-up by: D. Thomos Lemons DO,  November 16, 2009 1:19 PM    Additional Follow-up for Phone Call Additional follow up Details #2::    patient advised per Dr Artist Pais, she state the does need completion and she will drop the form off to the office  Form was received and forwarded to Dr. Artist Pais for completion.  Mervin Kung CMA  November 16, 2009 4:25 PM  Follow-up by: Glendell Docker CMA,  November 16, 2009 2:07 PM

## 2010-07-13 NOTE — Progress Notes (Signed)
  Phone Note Outgoing Call   Summary of Call: call pt - muscle enzyme blood test is normal Initial call taken by: D. Thomos Lemons DO,  June 25, 2009 6:15 PM  Follow-up for Phone Call        informed pt. that her muscke enzyme test is normal Follow-up by: Michaelle Copas,  June 26, 2009 8:57 AM

## 2010-07-13 NOTE — Assessment & Plan Note (Signed)
Summary: 6 MONTHS ROV-CH   Vital Signs:  Patient profile:   60 year old female O2 Sat:      100 % on Room air Temp:     97.7 degrees F oral Pulse rate:   66 / minute Pulse rhythm:   regular Resp:     18 per minute BP sitting:   158 / 90  (right arm) Cuff size:   large  Vitals Entered By: Glendell Docker CMA (May 29, 2009 2:11 PM)  O2 Flow:  Room air  Primary Care Provider:  D. Thomos Lemons DO  CC:  6 Month Follow up.  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  she doesn't have normal sensation but feels like something is "not right" in her left hip.  she reports remote "injury" when her legs were positioned in stirrups and something went wrong.  no f/u because of quadriplegia.  it feels like a bone is sticking further out on left side.     Preventive Screening-Counseling & Management  Alcohol-Tobacco     Smoking Status: never  Allergies: 1)  ! 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   she has new motorized wheel chair  Physical Exam  General:  alert, well-developed, and well-nourished.   Lungs:  normal respiratory effort and normal breath  sounds.   Heart:  normal rate, regular rhythm, and no gallop.   Msk:  no obvious palpable abnormality Neurologic:  cranial nerves II-XII intact and gait normal.     Impression & Recommendations:  Problem # 1:  HIP PAIN, LEFT (ICD-719.45) Pt with left hip discomfort.  she describes bone sticking out.  She may have torn her intracapsular ligament in her remote past.  check x ray.   I suggested slight repositioning her seat cushion.  if persistent symptoms, refer to ortho. Her updated medication list for this problem includes:    Baclofen 20 Mg Tabs (Baclofen) .Marland Kitchen... 1/2 tablet by mouth three times a day    Hydrocodone-acetaminophen 5-500 Mg Tabs (Hydrocodone-acetaminophen) .Marland Kitchen... 1-2 every 4-6 hours as needed pain  Orders: T-Hip Comp Left Min 2-views (73510TC)  Complete Medication List: 1)  Colyte 240 Gm Solr (Peg 3350-kcl-nabcb-nacl-nasulf) .... 2/3 cup for bowel program 2)  Furosemide 40 Mg Tabs (Furosemide) .... Take 1/2  tablet by mouth two times a day 3)  Baclofen 20 Mg Tabs (Baclofen) .... 1/2 tablet by mouth three times a day 4)  Potassium Chloride Crys Cr 20 Meq Cr-tabs (Potassium chloride crys cr) .... 2 tablets by mouth two times a day 5)  Oxybutynin Chloride 5 Mg Tabs (Oxybutynin  chloride) .... Take 1 tablet by mouth every morning 6)  One-a-day Womens Formula Tabs (Multiple vitamins-calcium) .... Take 1 tablet by mouth once a day 7)  Fergon 240 (27 Fe) Mg Tabs (Ferrous gluconate) .... Take 1 tablet by mouth every morning 8)  Diazepam 5 Mg Tabs (Diazepam) .... 1/2 tablet by mouth at bedtime as needed 9)  Hydrocodone-acetaminophen 5-500 Mg Tabs (Hydrocodone-acetaminophen) .Marland Kitchen.. 1-2 every 4-6 hours as needed pain 10)  Pravastatin Sodium 40 Mg Tabs (Pravastatin sodium) .... One by mouth qpm  Patient Instructions: 1)  Please schedule a follow-up appointment in 2 months. 2)  Call our office if your symptoms do not  improve or gets worse. Prescriptions: HYDROCODONE-ACETAMINOPHEN 5-500  MG TABS (HYDROCODONE-ACETAMINOPHEN) 1-2 every 4-6 hours as needed pain  #60 x 0   Entered and Authorized by:   D. Thomos Lemons DO   Signed by:   D. Thomos Lemons DO on 05/29/2009   Method used:   Print then Give to Patient   RxID:   [id number]    Immunization History:  Influenza Immunization History:    Influenza:  historical (03/31/2009)   Current Allergies (reviewed today): ! * BANDAGE ADHESIVE

## 2010-07-13 NOTE — Progress Notes (Signed)
Summary: Colyte Update  Phone Note Call from Patient Call back at Home Phone [phone]   Caller: Patient Reason for Call: Talk to Nurse Summary of Call: patient called and left voice message requesting a return phone call regarding her Colyte.  Call was returned to patient. She states that her insurance will not cover the full rx of Colyte. They will cover one jar, but not 2. She states the Colyte is a big part of her bowel program and feels the need to continue with it. She wanted to give Dr Artist Pais a heads up because the insurance company will be faxing information over for the Dr to review. Patient was informed if the information is not received a call would be returned to her informing her. She verbalized understanding and is okay to await outcome Initial call taken by: Glendell Docker CMA,  June 17, 2009 6:21 PM  Follow-up for Phone Call        no information received from insurance company regarding patient rx for colyte Follow-up by: Glendell Docker CMA,  June 22, 2009 3:49 PM

## 2010-07-13 NOTE — Consult Note (Signed)
Summary: [facility]  [facility]   Imported By: Lanelle Bal 11/24/2009 11:03:33  _____________________________________________________________________  External Attachment:    Type:   Image     Comment:   External Document

## 2010-07-13 NOTE — Assessment & Plan Note (Signed)
Summary: 3 MONTH ROV-CH   wheel chair  eval.   Paperwork from [facility] ser...   Vital Signs:  Patient profile:   60 year old female Temp:     98.1 degrees F oral Pulse rate:   80 / minute Pulse rhythm:   regular Resp:     16 per minute BP sitting:   124 / 80  Vitals Entered By: Glendell Docker CMA (November 27, 2008 2:53 PM)  Primary Care Provider:  Dondra Spry DO  CC:  3 Month Follow up disease management.  History of Present Illness: 60 y/o white female with history of quadriplegia for power wheel chair evaluation.   She has been quadriplegic since 1981.   She suffered ruptured AVM of C spine.   She requires significant assistance to perform ADLs including dressing, toileting, and bathing.   She has trunk weakness and upper extremity weakness.   She not able to operate manual wheelchair.    She has hoist at home for pt transfers.     As a result of her ruptured c spine AVM,  she also has neurogenic bladder and bowel dysfuntion - constipation and fecal retention.  She also has history of mulitple shoulder surgeries which has limited ROM of both shoulders.  She has enough hand strength to operate joystick on wheel chair.  Neck strength is limited.  She need head rest for intermittent head support.  Allergies: 1)  ! 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   Head:  normocephalic and atraumatic.   Eyes:  vision grossly intact, pupils equal, pupils round, and pupils reactive to light.   Ears:  R ear normal and L ear normal.   Neck:  able to hold her head up w/o assistance Lungs:  normal respiratory effort and normal breath sounds.   Heart:  normal rate, regular rhythm, and no gallop.   Abdomen:  soft, non-tender, no hepatomegaly, and no splenomegaly.   Msk:  gross ROM shoulder 90 degrees Hip strength 0/5 Knee strength 0/5 Ankle strength /05 Shoulder and elbow strenght 3/5 Hand grip - weak L greater than right Extremities:  trace left pedal edema and trace right pedal edema.   Neurologic:  alert & oriented X3 and cranial nerves II-XII intact.  limited upper ext strenght.   She is able to adequately operated control on powered wheelchair.   LE paralyzed. Skin:  warm, dry Psych:  normally interactive, good eye contact, not anxious appearing, and not depressed appearing.     Impression & Recommendations:  Problem # 1:  QUADRIPLEGIA (ICD-344.00) 60 y/o white female with hx of quadriplegia from C spine AVM 1981.   She does not have enough upper extremity strength or trunk stability to operate manual wheelchair or scooter.   Physical therapist has also completed assessment.   These results were reviewed.  I agree with assessment.   Pt needs Power wheelchair with elevated leg rest and head support.    Complete Medication List: 1)  Colyte 240 Gm Solr (Peg 3350-kcl-nabcb-nacl-nasulf) .... 2/3 cup for bowel program 2)  Furosemide 40 Mg Tabs (Furosemide) .... Take 1/2  tablet by mouth two times a day 3)  Baclofen 20 Mg Tabs (Baclofen) .... 1/2 tablet by mouth three times a day 4)  Potassium Chloride Crys Cr 20 Meq Cr-tabs (Potassium chloride crys cr) .... 2 tablets by mouth two times a day 5)  Oxybutynin Chloride 5 Mg Tabs (Oxybutynin chloride) .... Take 1 tablet by mouth every morning 6)  One-a-day Womens Formula Tabs (Multiple  vitamins-calcium) .... Take 1 tablet by mouth once a day 7)  Fergon 240 (27 Fe) Mg Tabs (Ferrous gluconate) .... Take 1 tablet by mouth every morning 8)  Diazepam 5 Mg Tabs (Diazepam) .... 1/2 tablet by mouth at bedtime as needed 9)  Hydrocodone-acetaminophen 5-500 Mg Tabs (Hydrocodone-acetaminophen) .Marland Kitchen.. 1-2 every 4-6 hours as needed pain 10)  Pravastatin Sodium 40 Mg Tabs (Pravastatin sodium) .... One by mouth qpm  Patient Instructions: 1)  Please schedule a follow-up appointment in 6 months.    Current Allergies (reviewed today): ! * BANDAGE ADHESIVE

## 2010-07-13 NOTE — Progress Notes (Signed)
Summary: Peg Electrolyte Refill  Phone Note Call from Patient Call back at Home Phone [phone]   Caller: Patient Reason for Call: Refill Medication Summary of Call: patient called and left voice message stating she has received all of rxs except for te Peg electrolytes. She would like to know if that could be refilled and sent to [facility] Initial call taken by: Glendell Docker CMA,  October 07, 2009 2:32 PM  Follow-up for Phone Call        Pt aware Follow-up by: Payton Spark CMA,  October 08, 2009 8:53 AM    Prescriptions: PEG 3350/ELECTROLYTES 240 GM SOLR (PEG 3350-KCL-NABCB-NACL-NASULF) 2/3 cup  every morning mix with 5 ounces of water take by mouth  once daily  #3 month x 3   Entered and Authorized by:   D. Thomos Lemons DO   Signed by:   D. Thomos Lemons DO on 10/07/2009   Method used:   Electronically to        [facility]* (retail)       [address] [place], [place]  [address]       Ph: [phone] or [phone]       Fax: [phone]   RxID:   [id number] PEG 3350/ELECTROLYTES 240 GM SOLR (PEG 3350-KCL-NABCB-NACL-NASULF) 2/3 cup  every morning mix with 5 ounces of water take by mouth  once daily  #3 month x 3   Entered and Authorized by:   D. Thomos Lemons DO   Signed by:   D. Thomos Lemons DO on 10/07/2009   Method used:   [facility] to        [facility]       [address]       [place], [place]  [address]       Ph: [phone]       Fax: [phone]   RxID:   [id number]

## 2010-07-13 NOTE — Assessment & Plan Note (Signed)
Summary: flu injection  Nurse Visit   Allergies: 1)  ! * Bandage Adhesive  Immunizations Administered:  Influenza Vaccine # 1:    Vaccine Type: Fluvax MCR    Site: right deltoid    Mfr: [employer]oSmith[employer]    Dose: 0.5 ml    Route: IM    Given by: Glendell Docker CMA    Exp. Date: 12/11/2010    Lot #: [id number]    VIS given: 01/05/10 version given April 01, 2010.  Flu Vaccine Consent Questions:    Do you have a history of severe allergic reactions to this vaccine? no    Any prior history of allergic reactions to egg and/or gelatin? no    Do you have a sensitivity to the preservative Thimersol? no    Do you have a past history of Guillan-Barre Syndrome? no    Do you currently have an acute febrile illness? no    Have you ever had a severe reaction to latex? no    Vaccine information given and explained to patient? yes    Are you currently pregnant? no  Orders Added: 1)  Administration Flu vaccine - MCR [G0008] 2)  Influenza Vaccine MCR [00025]

## 2010-07-13 NOTE — Assessment & Plan Note (Signed)
Summary: INSUR PAPERS/CH   Vital Signs:  Patient profile:   60 year old female Temp:     98.1 degrees F oral Pulse rate:   64 / minute Pulse rhythm:   regular Resp:     18 per minute BP sitting:   130 / 80  (right arm) Cuff size:   large  Vitals Entered By: Glendell Docker CMA (January 22, 2009 3:54 PM)  Primary Care Provider:  Dondra Spry DO  CC:  Paperwork.  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 Patient has been doing well. Patient is in the process of getting recertified for disability.  Disability forms need to be completed.  Doing well with diet.  She is maintaining her weight loss.      Allergies: 1)  ! 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 Neurogenic bladder  Bowel dysfuntion - constipation and fecal retention  Hx of bladder stones Mulitple shoulder surgeries - limited ROM of both shoulders Hyperlipidemia  Hypertension  Obesity   Past Surgical History: Laminectomy - AVM 1981 Tendon transfer to right hand 1982-1983 Left shoulder rotator cuff surgery 1996, 1998 Left shoulder replacement 2001  Right shoulder arthroscopy 2008   Family History: Family History of CAD Female 1st degree relative  (Father had first MI in his early 71's)  He was smoker Breast cancer - mother (radiation, no chemo) Colon ca - no       Social History: Occupation:- Disabled Editor, 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   Lungs:  normal respiratory effort and normal breath sounds.   Heart:  normal rate, regular rhythm, and no gallop.   Extremities:  trace left pedal edema and trace right pedal edema.     Impression & Recommendations:  Problem # 1:  QUADRIPLEGIA (ICD-344.00)  Disability forms completed.  Complete Medication List: 1)  Colyte 240 Gm Solr (Peg 3350-kcl-nabcb-nacl-nasulf) .... 2/3 cup for bowel program 2)  Furosemide 40 Mg Tabs (Furosemide) .... Take 1/2  tablet by mouth two times a day 3)  Baclofen 20 Mg Tabs (Baclofen) .... 1/2 tablet by mouth three times a day 4)  Potassium Chloride Crys Cr 20 Meq Cr-tabs (Potassium chloride crys cr) .... 2 tablets by mouth two times a day 5)  Oxybutynin Chloride 5 Mg Tabs (Oxybutynin chloride) .... Take 1 tablet by mouth every morning 6)  One-a-day Womens Formula Tabs (Multiple vitamins-calcium) .... Take 1 tablet by mouth once a day 7)  Fergon 240 (27 Fe) Mg Tabs (Ferrous gluconate) .... Take 1 tablet by mouth every morning 8)  Diazepam 5 Mg Tabs (Diazepam) .... 1/2 tablet by mouth at bedtime as needed 9)  Hydrocodone-acetaminophen 5-500 Mg Tabs (Hydrocodone-acetaminophen) .Marland Kitchen.. 1-2 every 4-6 hours as needed pain 10)  Pravastatin Sodium 40 Mg Tabs (Pravastatin sodium) .... One by mouth qpm

## 2010-07-13 NOTE — Miscellaneous (Signed)
Summary: mammogram  Clinical Lists Changes  Observations: Added new observation of MAMMOGRAM: normal (01/11/2010 16:41)      Preventive Care Screening  Mammogram:    Date:  01/11/2010    Results:  normal

## 2010-07-13 NOTE — Progress Notes (Signed)
  Phone Note Outgoing Call   Summary of Call: call pt - hip x rays is  negative Initial call taken by: D. Thomos Lemons DO,  June 05, 2009 8:44 PM  Follow-up for Phone Call        informed pt.hip x-ray is negative. Follow-up by: Michaelle Copas,  June 08, 2009 4:59 PM

## 2010-07-13 NOTE — Letter (Signed)
Summary: [facility]  [facility]   Imported By: Maryln Gottron 03/29/2010 14:15:05  _____________________________________________________________________  External Attachment:    Type:   Image     Comment:   External Document

## 2010-07-13 NOTE — Progress Notes (Signed)
Summary: refill request  Phone Note Refill Request Message from:  Fax from Pharmacy on January 29, 2009 10:29 AM  Refills Requested: Medication #1:  DIAZEPAM 5 MG TABS 1/2 tablet by mouth at bedtime as needed  Method Requested: Fax to Local Pharmacy Initial call taken by: Eliezer Lofts,  January 29, 2009 10:30 AM  Follow-up for Phone Call        Can this be refilled Follow-up by: Ami Bullins CMA,  January 29, 2009 12:18 PM  Additional Follow-up for Phone Call Additional follow up Details #1::        ok to refill x3  rx was faxed 01/29/2009 Glendell Docker CMA  January 30, 2009 3:49 PM  Additional Follow-up by: D. Thomos Lemons DO,  January 29, 2009 2:42 PM    Additional Follow-up for Phone Call Additional follow up Details #2::    pharmacy called requesting refill on Furosemide, pharmacist, states patient is out and is in need of a refill.   Rx faxed to pharmacy Follow-up by: Glendell Docker CMA,  January 30, 2009 3:51 PM  Prescriptions: FUROSEMIDE 40 MG TABS (FUROSEMIDE) Take 1/2  tablet by mouth two times a day  #30 x 5   Entered by:   Glendell Docker CMA   Authorized by:   D. Thomos Lemons DO   Signed by:   Glendell Docker CMA on 01/30/2009   Method used:   Faxed to ...       [facility]* (retail)       [address] [place], [place]  [address]       Ph: [phone] or [phone]       Fax: [phone]   RxID:   [id number] DIAZEPAM 5 MG TABS (DIAZEPAM) 1/2 tablet by mouth at bedtime as needed  #15 x 3   Entered by:   Ami Bullins CMA   Authorized by:   D. Thomos Lemons DO   Signed by:   Bill Salinas CMA on 01/29/2009   Method used:   Printed then faxed to ...       [facility]* (retail)       [address] [place], [place]  [address]       Ph: [phone] or [phone]       Fax: [phone]   RxID:   [id number]

## 2010-07-13 NOTE — Letter (Signed)
Summary: Surgical Clearance/[facility]  Surgical Clearance/[facility]   Imported By: Lanelle Bal 12/02/2009 10:23:46  _____________________________________________________________________  External Attachment:    Type:   Image     Comment:   External Document

## 2010-07-13 NOTE — Progress Notes (Signed)
Summary: Pre-Op Status  Phone Note Call from Patient Call back at Home Phone [phone] Call back at [phone]   Caller: Patient Call For: D. Thomos Lemons DO Summary of Call: patient called and left voice message stating she was checking on the status of the surgical clearance form for Dr [facility] office, and if she could be called back regarding the status.  Call was returned to patient, she was advised the form has been recieved, and that I have checked with Dr Angelica Pou office and they have not recieved the information to date. Patient was informed Dr Artist Pais is out of the office today and I would check with the him to see if the information has been faxed to Dr Victorino Dike and return the call to her on Thursday. Patient verbalized understanding and agrees to wait for a return call Initial call taken by: Glendell Docker CMA,  November 25, 2009 4:25 PM  Follow-up for Phone Call        she is cleared for surgery.  I am not sure if form completed or not.  please have Dr. Laverta Baltimore office re fax form Follow-up by: D. Thomos Lemons DO,  November 25, 2009 7:31 PM  Additional Follow-up for Phone Call Additional follow up Details #1::        call placed to patient, she has been advised of surgical clearnace for Dr Victorino Dike Additional Follow-up by: Glendell Docker CMA,  November 26, 2009 9:05 AM

## 2010-07-15 NOTE — Progress Notes (Signed)
Summary: Potassium Refill  Phone Note Refill Request Message from:  Fax from Pharmacy on June 29, 2010 10:29 AM  Refills Requested: Medication #1:  POTASSIUM CHLORIDE CRYS CR 20 MEQ CR-TABS 2 tablets by mouth two times a day   Dosage confirmed as above?Dosage Confirmed   Brand Name Necessary? No   Supply Requested: 3 months   Last Refilled: 03/06/2010  Method Requested: Electronic Next Appointment Scheduled: 4/23/201@ 2:15p Dr Artist Pais Initial call taken by: Glendell Docker CMA,  June 29, 2010 10:30 AM  Follow-up for Phone Call        Rx completed in Dr. Tiajuana Amass Follow-up by: Glendell Docker CMA,  June 29, 2010 10:31 AM    Prescriptions: POTASSIUM CHLORIDE CRYS CR 20 MEQ CR-TABS (POTASSIUM CHLORIDE CRYS CR) 2 tablets by mouth two times a day  #120 x 3   Entered by:   Glendell Docker CMA   Authorized by:   D. Thomos Lemons DO   Signed by:   Glendell Docker CMA on 06/29/2010   Method used:   Electronically to        [facility]* (retail)       [address] [place], [place]  [address]       Ph: [phone] or [phone]       Fax: [phone]   RxID:   [id number]

## 2010-08-04 ENCOUNTER — Telehealth: Payer: Self-pay | Admitting: Internal Medicine

## 2010-08-09 ENCOUNTER — Telehealth: Payer: Self-pay | Admitting: Internal Medicine

## 2010-08-10 ENCOUNTER — Encounter: Payer: Self-pay | Admitting: Internal Medicine

## 2010-08-10 NOTE — Progress Notes (Signed)
Summary: refill-baclofen  Phone Note Refill Request Message from:  Fax from Pharmacy on August 04, 2010 11:16 AM  Refills Requested: Medication #1:  BACLOFEN 20 MG TABS 1/2 to 1  tablet by mouth three times a day   Brand Name Necessary? No   Supply Requested: 1 month   Last Refilled: 06/01/2010 [facility] [address] [address] FAX [phone]   Method Requested: Electronic Next Appointment Scheduled: 4.23.12 YOO Initial call taken by: Elba Barman,  August 04, 2010 11:19 AM  Follow-up for Phone Call        ok to refil x 3 Follow-up by: D. Thomos Lemons DO,  August 04, 2010 6:50 PM  Additional Follow-up for Phone Call Additional follow up Details #1::        Rx sent electronically to pharmacy Additional Follow-up by: Glendell Docker CMA,  August 05, 2010 8:36 AM    Prescriptions: BACLOFEN 20 MG TABS (BACLOFEN) 1/2 to 1  tablet by mouth three times a day  #90 x 3   Entered by:   Glendell Docker CMA   Authorized by:   D. Thomos Lemons DO   Signed by:   Glendell Docker CMA on 08/05/2010   Method used:   Electronically to        [facility] (retail)       [address]       [place], [place]  [address]       Ph: [phone]       Fax: [phone]   RxID:   [id number]

## 2010-08-10 NOTE — Letter (Signed)
Summary: [facility]   Imported By: Maryln Gottron 08/05/2010 16:00:36  _____________________________________________________________________  External Attachment:    Type:   Image     Comment:   External Document

## 2010-08-17 ENCOUNTER — Ambulatory Visit (HOSPITAL_COMMUNITY)
Admission: RE | Admit: 2010-08-17 | Discharge: 2010-08-19 | Disposition: A | Discharge status: Home / Self Care | Payer: Medicare Other | Source: Ambulatory Visit | Attending: General Surgery | Admitting: General Surgery

## 2010-08-17 DIAGNOSIS — G8250 Quadriplegia, unspecified: Secondary | ICD-10-CM | POA: Insufficient documentation

## 2010-08-17 DIAGNOSIS — Z79899 Other long term (current) drug therapy: Secondary | ICD-10-CM | POA: Insufficient documentation

## 2010-08-17 DIAGNOSIS — K439 Ventral hernia without obstruction or gangrene: Secondary | ICD-10-CM | POA: Insufficient documentation

## 2010-08-17 DIAGNOSIS — Z01812 Encounter for preprocedural laboratory examination: Secondary | ICD-10-CM | POA: Insufficient documentation

## 2010-08-17 DIAGNOSIS — Z01810 Encounter for preprocedural cardiovascular examination: Secondary | ICD-10-CM | POA: Insufficient documentation

## 2010-08-17 DIAGNOSIS — N319 Neuromuscular dysfunction of bladder, unspecified: Secondary | ICD-10-CM | POA: Insufficient documentation

## 2010-08-17 HISTORY — PX: VENTRAL HERNIA REPAIR: SHX424

## 2010-08-17 LAB — DIFFERENTIAL
Basophils Absolute: 0 10*3/uL (ref 0.0–0.1)
Basophils Relative: 1 % (ref 0–1)
Eosinophils Absolute: 0.1 10*3/uL (ref 0.0–0.7)
Eosinophils Relative: 2 % (ref 0–5)
Lymphocytes Relative: 30 % (ref 12–46)
Lymphs Abs: 2 10*3/uL (ref 0.7–4.0)
Monocytes Absolute: 0.5 10*3/uL (ref 0.1–1.0)
Monocytes Relative: 7 % (ref 3–12)
Neutro Abs: 4 10*3/uL (ref 1.7–7.7)
Neutrophils Relative %: 60 % (ref 43–77)

## 2010-08-17 LAB — CBC
HCT: 41.4 % (ref 36.0–46.0)
Hemoglobin: 13.8 g/dL (ref 12.0–15.0)
MCH: 27.9 pg (ref 26.0–34.0)
MCHC: 33.3 g/dL (ref 30.0–36.0)
MCV: 83.6 fL (ref 78.0–100.0)
Platelets: 184 10*3/uL (ref 150–400)
RBC: 4.95 MIL/uL (ref 3.87–5.11)
RDW: 13.1 % (ref 11.5–15.5)
WBC: 6.5 10*3/uL (ref 4.0–10.5)

## 2010-08-17 LAB — URINE MICROSCOPIC-ADD ON

## 2010-08-17 LAB — URINALYSIS, ROUTINE W REFLEX MICROSCOPIC
Bilirubin Urine: NEGATIVE
Glucose, UA: NEGATIVE mg/dL
Hgb urine dipstick: NEGATIVE
Ketones, ur: NEGATIVE mg/dL
Nitrite: POSITIVE — AB
Protein, ur: NEGATIVE mg/dL
Specific Gravity, Urine: 1.016 (ref 1.005–1.030)
Urobilinogen, UA: 0.2 mg/dL (ref 0.0–1.0)
pH: 8 (ref 5.0–8.0)

## 2010-08-17 LAB — SURGICAL PCR SCREEN
MRSA, PCR: NEGATIVE
Staphylococcus aureus: NEGATIVE

## 2010-08-17 LAB — BASIC METABOLIC PANEL
BUN: 10 mg/dL (ref 6–23)
CO2: 27 mEq/L (ref 19–32)
Calcium: 9.2 mg/dL (ref 8.4–10.5)
Chloride: 106 mEq/L (ref 96–112)
Creatinine, Ser: 0.44 mg/dL (ref 0.4–1.2)
GFR calc Af Amer: >60 mL/min (ref >60)
GFR calc non Af Amer: >60 mL/min (ref >60)
Glucose, Bld: 110 mg/dL — ABNORMAL HIGH (ref 70–99)
Potassium: 3.7 mEq/L (ref 3.5–5.1)
Sodium: 139 mEq/L (ref 135–145)

## 2010-08-19 LAB — URINE CULTURE
Colony Count: >=100000
Culture  Setup Time: 201203061110
Special Requests: NEGATIVE

## 2010-08-19 NOTE — Progress Notes (Signed)
Summary: Baclofen Refill  Phone Note Call from Patient Call back at Home Phone [phone]   Caller: Patient Call For: D. Thomos Lemons DO Summary of Call: patient call stating she was having trouble getting her Baclofen refill. Initial call taken by: Glendell Docker CMA,  August 09, 2010 4:42 PM  Follow-up for Phone Call        call was returned to patient at [phone], she was informed rx was sent to the wrong pharmacy and that it would be sent to [facility].  Call was placed to [facility] [phone], spoke with pharmacist, rx on file there for Baclofen has been cancelled . Follow-up by: Glendell Docker CMA,  August 09, 2010 4:46 PM    Prescriptions: BACLOFEN 20 MG TABS (BACLOFEN) 1/2 to 1  tablet by mouth three times a day  #90 x 3   Entered by:   Glendell Docker CMA   Authorized by:   D. Thomos Lemons DO   Signed by:   Glendell Docker CMA on 08/09/2010   Method used:   Electronically to        [facility]* (retail)       [address] [place], [place]  [address]       Ph: [phone] or [phone]       Fax: [phone]   RxID:   [id number]

## 2010-08-22 NOTE — Op Note (Signed)
[name], [name]                   ACCOUNT NO.:  [id number]  MEDICAL RECORD NO.:  [id number]           PATIENT TYPE:  O  LOCATION:  DAYL                         FACILITY:  [facility]  PHYSICIAN:  Caitlin [name]. Andrey Campanile, MD     DATE OF BIRTH:  27-Dec-1950  DATE OF PROCEDURE:  08/17/2010 DATE OF DISCHARGE:                              OPERATIVE REPORT   PREOPERATIVE DIAGNOSIS:  Ventral hernia.  POSTOPERATIVE DIAGNOSIS:  Ventral hernia.  PROCEDURE:  Laparoscopic repair of ventral hernia with mesh.  SURGEON:  Olyvia [name]. Andrey Campanile, MD  ASSISTANT SURGEON:  None.  ANESTHESIA:  General plus 30 cc of 0.25% Marcaine plain.  FINDINGS:  The patient had a linear fascial defect extending from her umbilicus up her midline.  The length of the defect was 8 cm by about nearly 4 cm wide.  I obtained a piece of Ethicon Physiomesh 15 x 20 cm and trimmed it to approximately 16 x 12 cm.  By that way, there was a 4- cm overlap circumferentially around the defect.  ESTIMATED BLOOD LOSS:  Minimal.  INDICATIONS FOR PROCEDURE:  The patient is a 60 year old very pleasant quadriplegic female who is essentially chair ridden.  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  She noticed some abdominal discomfort intermittently especially when she was leaning over or when she was trying to lift herself.  She underwent a CT scan at an outside facility which demonstrated a supraumbilical ventral hernia that was about 2 cm in length.  We discussed the risks and benefits of surgery including bleeding, infection, injury to surrounding structures, mesh complications, mesh infection, DVT occurrence, wound complications, general anesthesia complications given her underlying medical condition as well as postoperative pain as well as ileus.  She elected to proceed with surgery.  DESCRIPTION OF PROCEDURE:  The patient was given 5000 units of heparin within 2 hours surgery.  She was then taken to the operating room after informed consent was  obtained.  General endotracheal anesthesia was established.  She has an indwelling Foley catheter.  Her abdomen was prepped and draped in usual standard surgical fashion.  She received antibiotics prior to skin incision.  A time-out was performed.  I elected to gain entry to her abdomen using Optiview technique.  A 1-cm incision was made 2 fingerbreadths below the left subcostal margin out laterally.  Then using a 0-degree scope through a 5-mm trocar, I passed the trocar through all layers of the abdominal wall under direct visualization and gently entered her abdominal cavity.  Pneumoperitoneum was smoothly established up to a patient pressure of 15 mmHg.  The abdominal cavity was surveilled.  She did have what appeared to be some dilated gas filled colon.  Her fascial defect was above her umbilicus. It was about 4 cm wide x about 8 cm long.  I ended up taking down the falciform ligament with a harmonic scalpel.  Prior to doing this, I placed an 11-mm trocar in the left lower abdominal wall under direct visualization after local had been infiltrated.  Then using a spinal needle, I marked out the edges of the fascial  defect and marked that.  This area measured approximately 8 cm long x about 4 cm wide.  I then measured 4cm out from the defect circumferentially  This now measured 16 x 12.  I obtained a piece of Ethicon Physiomesh, trimmed it to 16cm x 12cm.  I then placed 8 sutures consisting of #1 Novafil through the mesh along the periphery of the mesh in a circumferential pattern around the mesh.  I then rolled the mesh up like a cigar, placed it in the abdominal cavity and rolled it with the proper orientation.  I then circumferentially brought up the transfascial sutures after making a small incision in the skin with a #11 blade and bringing up each of the sutures and placed a hemostat on the tails. This was done in a circumferential pattern to all 8 transfascial sutures brought out  through the abdominal wall.  I lifted up on the hemostats, thus bringing the mesh flushed against the abdominal wall.  There was literally no redundancy in the mesh.  I then circumferentially tied down the transfascial sutures.  Then using an Ethicon secure strap tacker, I placed 25 tacks around the periphery of the mesh so that there were no gaps.  The mesh was well secured and flushed against the abdominal wall. There was one area where there was a hematoma.  I injected some additional local.  Pneumoperitoneum was released and the trocars were removed.  The 2 skin incisions were closed with 4-0 Monocryl in subcuticular fashion.  All skin incisions were then closed with Dermabond on top.  We then placed an abdominal binder on the patient. She was extubated and taken to the recovery room in stable condition. All needle, instrument, and sponge counts were correct x2.  There were no immediate complications.  The patient tolerated the procedure well.     [name]. Andrey Campanile, MD     EMW/MEDQ  D:  08/17/2010  T:  08/17/2010  Job:  [id number]  cc:   Excell Seltzer. Annabell Howells, M.D. Fax: [phone]  Electronically Signed by Gaynelle Adu M.D. on 08/22/2010 09:48:10 AM

## 2010-08-25 ENCOUNTER — Emergency Department (HOSPITAL_COMMUNITY): Payer: Medicare Other

## 2010-08-25 ENCOUNTER — Inpatient Hospital Stay (HOSPITAL_COMMUNITY)
Admission: EM | Admit: 2010-08-25 | Discharge: 2010-09-03 | DRG: 388 | Disposition: A | Discharge status: Home / Self Care | Payer: Medicare Other | Attending: General Surgery | Admitting: General Surgery

## 2010-08-25 DIAGNOSIS — Z79899 Other long term (current) drug therapy: Secondary | ICD-10-CM

## 2010-08-25 DIAGNOSIS — R532 Functional quadriplegia: Secondary | ICD-10-CM | POA: Diagnosis present

## 2010-08-25 DIAGNOSIS — M545 Low back pain, unspecified: Secondary | ICD-10-CM | POA: Diagnosis present

## 2010-08-25 DIAGNOSIS — B964 Proteus (mirabilis) (morganii) as the cause of diseases classified elsewhere: Secondary | ICD-10-CM | POA: Diagnosis present

## 2010-08-25 DIAGNOSIS — M4120 Other idiopathic scoliosis, site unspecified: Secondary | ICD-10-CM | POA: Diagnosis present

## 2010-08-25 DIAGNOSIS — N319 Neuromuscular dysfunction of bladder, unspecified: Secondary | ICD-10-CM | POA: Diagnosis present

## 2010-08-25 DIAGNOSIS — G8918 Other acute postprocedural pain: Secondary | ICD-10-CM | POA: Diagnosis present

## 2010-08-25 DIAGNOSIS — N390 Urinary tract infection, site not specified: Secondary | ICD-10-CM | POA: Diagnosis present

## 2010-08-25 DIAGNOSIS — K560 Paralytic ileus: Principal | ICD-10-CM | POA: Diagnosis present

## 2010-08-25 LAB — CBC
HCT: 50.4 % — ABNORMAL HIGH (ref 36.0–46.0)
Hemoglobin: 16.9 g/dL — ABNORMAL HIGH (ref 12.0–15.0)
MCH: 28 pg (ref 26.0–34.0)
MCHC: 33.5 g/dL (ref 30.0–36.0)
MCV: 83.6 fL (ref 78.0–100.0)
Platelets: 267 10*3/uL (ref 150–400)
RBC: 6.03 MIL/uL — ABNORMAL HIGH (ref 3.87–5.11)
RDW: 13.2 % (ref 11.5–15.5)
WBC: 11.8 10*3/uL — ABNORMAL HIGH (ref 4.0–10.5)

## 2010-08-25 LAB — LACTIC ACID, PLASMA: Lactic Acid, Venous: 2 mmol/L (ref 0.5–2.2)

## 2010-08-25 LAB — COMPREHENSIVE METABOLIC PANEL
ALT: 15 U/L (ref 0–35)
AST: 16 U/L (ref 0–37)
Albumin: 3.1 g/dL — ABNORMAL LOW (ref 3.5–5.2)
Alkaline Phosphatase: 80 U/L (ref 39–117)
BUN: 10 mg/dL (ref 6–23)
CO2: 27 mEq/L (ref 19–32)
Calcium: 9 mg/dL (ref 8.4–10.5)
Chloride: 102 mEq/L (ref 96–112)
Creatinine, Ser: 0.58 mg/dL (ref 0.4–1.2)
GFR calc Af Amer: >60 mL/min (ref >60)
GFR calc non Af Amer: >60 mL/min (ref >60)
Glucose, Bld: 139 mg/dL — ABNORMAL HIGH (ref 70–99)
Potassium: 3.8 mEq/L (ref 3.5–5.1)
Sodium: 139 mEq/L (ref 135–145)
Total Bilirubin: 0.8 mg/dL (ref 0.3–1.2)
Total Protein: 6.9 g/dL (ref 6.0–8.3)

## 2010-08-25 LAB — URINALYSIS, ROUTINE W REFLEX MICROSCOPIC
Bilirubin Urine: NEGATIVE
Glucose, UA: NEGATIVE mg/dL
Hgb urine dipstick: NEGATIVE
Ketones, ur: >80 mg/dL — AB
Nitrite: NEGATIVE
Protein, ur: 30 mg/dL — AB
Specific Gravity, Urine: 1.024 (ref 1.005–1.030)
Urobilinogen, UA: 1 mg/dL (ref 0.0–1.0)
pH: 8.5 — ABNORMAL HIGH (ref 5.0–8.0)

## 2010-08-25 LAB — DIFFERENTIAL
Basophils Absolute: 0 10*3/uL (ref 0.0–0.1)
Basophils Relative: 0 % (ref 0–1)
Eosinophils Absolute: 0.1 10*3/uL (ref 0.0–0.7)
Eosinophils Relative: 1 % (ref 0–5)
Lymphocytes Relative: 10 % — ABNORMAL LOW (ref 12–46)
Lymphs Abs: 1.2 10*3/uL (ref 0.7–4.0)
Monocytes Absolute: 0.7 10*3/uL (ref 0.1–1.0)
Monocytes Relative: 6 % (ref 3–12)
Neutro Abs: 9.8 10*3/uL — ABNORMAL HIGH (ref 1.7–7.7)
Neutrophils Relative %: 83 % — ABNORMAL HIGH (ref 43–77)

## 2010-08-25 LAB — URINE MICROSCOPIC-ADD ON

## 2010-08-25 LAB — LIPASE, BLOOD: Lipase: 21 U/L (ref 11–59)

## 2010-08-25 MED ORDER — IOHEXOL 300 MG/ML  SOLN
100.0000 mL | Freq: Once | INTRAMUSCULAR | Status: AC | PRN
  Administered 2010-08-25: 100 mL via INTRAVENOUS

## 2010-08-26 LAB — BASIC METABOLIC PANEL
BUN: 9 mg/dL (ref 6–23)
CO2: 27 mEq/L (ref 19–32)
Calcium: 8.1 mg/dL — ABNORMAL LOW (ref 8.4–10.5)
Chloride: 104 mEq/L (ref 96–112)
Creatinine, Ser: 0.51 mg/dL (ref 0.4–1.2)
GFR calc Af Amer: >60 mL/min (ref >60)
GFR calc non Af Amer: >60 mL/min (ref >60)
Glucose, Bld: 161 mg/dL — ABNORMAL HIGH (ref 70–99)
Potassium: 4.3 mEq/L (ref 3.5–5.1)
Sodium: 135 mEq/L (ref 135–145)

## 2010-08-26 LAB — CBC
HCT: 41 % (ref 36.0–46.0)
Hemoglobin: 13.3 g/dL (ref 12.0–15.0)
MCH: 27.7 pg (ref 26.0–34.0)
MCHC: 32.4 g/dL (ref 30.0–36.0)
MCV: 85.4 fL (ref 78.0–100.0)
Platelets: 222 10*3/uL (ref 150–400)
RBC: 4.8 MIL/uL (ref 3.87–5.11)
RDW: 13.4 % (ref 11.5–15.5)
WBC: 10.9 10*3/uL — ABNORMAL HIGH (ref 4.0–10.5)

## 2010-08-27 ENCOUNTER — Inpatient Hospital Stay (HOSPITAL_COMMUNITY): Payer: Medicare Other

## 2010-08-27 LAB — BASIC METABOLIC PANEL
BUN: 6 mg/dL (ref 6–23)
CO2: 26 mEq/L (ref 19–32)
Calcium: 8 mg/dL — ABNORMAL LOW (ref 8.4–10.5)
Chloride: 106 mEq/L (ref 96–112)
Creatinine, Ser: 0.45 mg/dL (ref 0.4–1.2)
GFR calc Af Amer: >60 mL/min (ref >60)
GFR calc non Af Amer: >60 mL/min (ref >60)
Glucose, Bld: 129 mg/dL — ABNORMAL HIGH (ref 70–99)
Potassium: 3.8 mEq/L (ref 3.5–5.1)
Sodium: 137 mEq/L (ref 135–145)

## 2010-08-27 LAB — CBC
HCT: 38.2 % (ref 36.0–46.0)
Hemoglobin: 12.3 g/dL (ref 12.0–15.0)
MCH: 27.3 pg (ref 26.0–34.0)
MCHC: 32.2 g/dL (ref 30.0–36.0)
MCV: 84.9 fL (ref 78.0–100.0)
Platelets: 223 10*3/uL (ref 150–400)
RBC: 4.5 MIL/uL (ref 3.87–5.11)
RDW: 13.1 % (ref 11.5–15.5)
WBC: 7.8 10*3/uL (ref 4.0–10.5)

## 2010-08-28 ENCOUNTER — Inpatient Hospital Stay (HOSPITAL_COMMUNITY): Payer: Medicare Other

## 2010-08-29 LAB — CBC
HCT: 38.7 % (ref 36.0–46.0)
Hemoglobin: 12.4 g/dL (ref 12.0–15.0)
MCH: 27 pg (ref 26.0–34.0)
MCHC: 32 g/dL (ref 30.0–36.0)
MCV: 84.1 fL (ref 78.0–100.0)
Platelets: 235 10*3/uL (ref 150–400)
RBC: 4.6 MIL/uL (ref 3.87–5.11)
RDW: 13.2 % (ref 11.5–15.5)
WBC: 7.9 10*3/uL (ref 4.0–10.5)

## 2010-08-29 LAB — BASIC METABOLIC PANEL
BUN: 2 mg/dL — ABNORMAL LOW (ref 6–23)
CO2: 24 mEq/L (ref 19–32)
Calcium: 8 mg/dL — ABNORMAL LOW (ref 8.4–10.5)
Chloride: 110 mEq/L (ref 96–112)
Creatinine, Ser: 0.42 mg/dL (ref 0.4–1.2)
GFR calc Af Amer: >60 mL/min (ref >60)
GFR calc non Af Amer: >60 mL/min (ref >60)
Glucose, Bld: 118 mg/dL — ABNORMAL HIGH (ref 70–99)
Potassium: 3.5 mEq/L (ref 3.5–5.1)
Sodium: 138 mEq/L (ref 135–145)

## 2010-08-30 ENCOUNTER — Inpatient Hospital Stay (HOSPITAL_COMMUNITY): Payer: Medicare Other

## 2010-08-31 ENCOUNTER — Inpatient Hospital Stay (HOSPITAL_COMMUNITY): Payer: Medicare Other

## 2010-08-31 NOTE — H&P (Signed)
[name], [name]                   ACCOUNT NO.:  [id number]  MEDICAL RECORD NO.:  [id number]           PATIENT TYPE:  I  LOCATION:  1607                         FACILITY:  [facility]  PHYSICIAN:  Lennie Muckle, MD      DATE OF BIRTH:  Dec 12, 1950  DATE OF ADMISSION:  08/25/2010 DATE OF DISCHARGE:                             HISTORY & PHYSICAL   CHIEF COMPLAINT:  Abdominal pain and nausea.  [name] is a 60 year old female who apparently underwent laparoscopic ventral hernia repair by Dr. Gaynelle Adu on August 17, 2010.  She had a 2- day hospital stay and was doing fine at home until earlier today. She states that she had increasing abdominal discomfort.  She attempted to wean off her Vicodin.  She has also denied .  She has noticed some abdominal distention.  She had mild nausea today.  She has no evidence of fever, but has complained of sweating.  She also has some complaints of low back pain and diminished urine output.  CT scan obtained revealed dilated loops of small intestine.  She had some fluid within the abdomen.  There was a mild thickening of the small bowel .  She said she has had normal bowel movements.  She has a previous history of urinary infection with proteus.  She said she had a urine culture done prior to her surgery and was told that  antibiotics.  PAST MEDICAL HISTORY: 1. Scoliosis. 2. Quadriplegic.  SURGICAL HISTORY:  Laparoscopic hernia repair.  FAMILY HISTORY:  Noncontributory.  SOCIAL HISTORY:  She lives with her caregiver.  No tobacco or alcohol use.  REVIEW OF SYSTEMS:  Negative as in the HPI, previous urinary tract infections.  MEDICATIONS:  Multiple including; 1. Lasix, baclofen, potassium, oxybutynin, multivitamin, pravastatin,     diazepam, Vicodin.  ALLERGIES:  None.  PHYSICAL EXAMINATION:  GENERAL:  She is lying in a stretcher, with no acute distress, but appears uncomfortable. VITAL SIGNS:  Temperature is 98.7, heart rate 71, blood pressure  132/81, oxygen saturation is 96% on room air. HEENT:  Head is normocephalic.  Extraocular muscles are intact.  Pupils equal and round.  Sclerae conjunctivae clear.  Nares are clear.  She does use nasal cannula.  Oral mucosa has no lesions.  Slightly moist, good coloration. NECK:  No tenderness.  Trachea is midline.  Tongue without abnormality. CHEST:  Clear to auscultation bilaterally.  Normal expansion to percussion. CARDIOVASCULAR:  Regular rate and rhythm.  No murmurs, gallops, or rubs. No edema in lower extremities.  Pulses palpated in upper extremities. ABDOMEN:  Tympanic and distended.  Bowel sounds are present but somewhat sluggish.  No hernias.  There are multiple incisions on her abdomen, but none appear with drainage or erythema.  Appropriately tender to palpation.  No masses  are appreciated.  No peritoneal signs elicited.  . MUSCULOSKELETAL:  She has no deformities, but plantar  flexors bilateral feet. EXTREMITIES:  Cold to the touch, but color is slightly pale without lesions or edema signs. NEUROLOGIC:  Diminished sensation in the hips  with neurologic deficit in lower extremity.  White  count 11.8, hemoglobin 16, hematocrit 50, BUN and creatinine 10 and 0.5.  CT scan is reviewed, there is some fluid within the pelvis, there is a distended small bowel.  Urine culture from last week did grew out Morganella and proteus.  Those were sensitive to Cipro.  IMPRESSION: 1. Postoperative ileus as well as postoperative pain. 2. Urinary tract infection.  I have reassured with [name] that this is more than likely a postoperative ileus.  I think given enough time  she could follow up without problems.  laparoscopic repair.  urinary tract infection.  We will make Dr. Andrey Campanile aware of admission.     Lennie Muckle, MD     ALA/MEDQ  D:  08/25/2010  T:  08/26/2010  Job:  [id number]  Electronically Signed by Bertram Savin MD on 08/31/2010 12:09:07 PM

## 2010-09-01 LAB — BASIC METABOLIC PANEL
BUN: 2 mg/dL — ABNORMAL LOW (ref 6–23)
CO2: 24 mEq/L (ref 19–32)
Calcium: 8.4 mg/dL (ref 8.4–10.5)
Chloride: 107 mEq/L (ref 96–112)
Creatinine, Ser: 0.46 mg/dL (ref 0.4–1.2)
GFR calc Af Amer: >60 mL/min (ref >60)
GFR calc non Af Amer: >60 mL/min (ref >60)
Glucose, Bld: 117 mg/dL — ABNORMAL HIGH (ref 70–99)
Potassium: 3.9 mEq/L (ref 3.5–5.1)
Sodium: 138 mEq/L (ref 135–145)

## 2010-09-02 LAB — BASIC METABOLIC PANEL
BUN: 1 mg/dL — ABNORMAL LOW (ref 6–23)
CO2: 26 mEq/L (ref 19–32)
Calcium: 8.5 mg/dL (ref 8.4–10.5)
Chloride: 107 mEq/L (ref 96–112)
Creatinine, Ser: 0.32 mg/dL — ABNORMAL LOW (ref 0.4–1.2)
GFR calc Af Amer: >60 mL/min (ref >60)
GFR calc non Af Amer: >60 mL/min (ref >60)
Glucose, Bld: 98 mg/dL (ref 70–99)
Potassium: 4.1 mEq/L (ref 3.5–5.1)
Sodium: 139 mEq/L (ref 135–145)

## 2010-09-02 LAB — MAGNESIUM: Magnesium: 1.9 mg/dL (ref 1.5–2.5)

## 2010-09-03 LAB — BASIC METABOLIC PANEL
BUN: 5 mg/dL — ABNORMAL LOW (ref 6–23)
CO2: 26 mEq/L (ref 19–32)
Calcium: 8.5 mg/dL (ref 8.4–10.5)
Chloride: 100 mEq/L (ref 96–112)
Creatinine, Ser: 0.41 mg/dL (ref 0.4–1.2)
GFR calc Af Amer: >60 mL/min (ref >60)
GFR calc non Af Amer: >60 mL/min (ref >60)
Glucose, Bld: 112 mg/dL — ABNORMAL HIGH (ref 70–99)
Potassium: 4.3 mEq/L (ref 3.5–5.1)
Sodium: 133 mEq/L — ABNORMAL LOW (ref 135–145)

## 2010-09-03 LAB — MAGNESIUM: Magnesium: 2 mg/dL (ref 1.5–2.5)

## 2010-09-06 NOTE — Consult Note (Signed)
[name], [name]                   ACCOUNT NO.:  [id number]  MEDICAL RECORD NO.:  [id number]           PATIENT TYPE:  I  LOCATION:  1607                         FACILITY:  [facility]  PHYSICIAN:  Mazi Brailsford L. Malon [name]., M.D.DATE OF BIRTH:  [birth year]  DATE OF CONSULTATION:  09/01/2010 DATE OF DISCHARGE:                                CONSULTATION   REQUESTING PHYSICIAN:  Leroy Sella. Andrey Campanile, M.D.  PRIMARY CARE PHYSICIAN:  Barbette Hair. Artist Pais, D.O.  GASTROENTEROLOGIST:  Bernette Redbird, M.D.  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  She underwent recent ventral hernia repair by Dr. Gaynelle Adu, this was done laparoscopically and went home and was doing reasonably well but came back in because of abdominal discomfort.  She had been on some Vicodin. She has had problems with constipation, loose stools, and bowel control for a number of years and has most recently approximately 10-15 years ago seen Dr. Matthias Hughs and has been doing reasonably well with daily MiraLax and Dulcolax suppository and digital stimulation by her caregiver which precipitates a bowel movement.  Since being in the hospital, she has had a CT scan that showed dilated bowel with no gross obstruction and abdominal film showing an ileus.  With some MiraLax and stimulation, she has had bowel movements that were liquid or pasty but has continued to have an ileus demonstrated by a KUB and with a distended abdomen.  She has only been able to tolerate clear liquids. Her labs in early March revealed a potassium of 3.7, normal liver test, and a normal calcium.  Her potassium has remained pretty much from 3.5- 3.9.  She has not really had any abdominal pain but has had some slight nausea.  She has not had any fever.  She is currently only on clear liquids.  CURRENT MEDICATIONS: 1. Baclofen. 2. Dulcolax suppository. 3. Colace. 4. Subcutaneous  heparin. 5. Clear liquids. 6. Ditropan. 7. Protonix. 8. MiraLax. 9. Pravachol. 10.Valium. 11.Zofran. 12.Morphine. 13.Afrin. 14.Senokot.  ALLERGIES:  She is allergic to ADHESIVE TAPE which causes rashes.  PAST MEDICAL HISTORY:  High-level quadriplegia as mentioned.  Surgeries include recent laparoscopic ventral hernia repair.  She has no other significant medical history.  PHYSICAL EXAMINATION:  VITAL SIGNS:  Temperature 98.5, blood pressure 137/78, and O2 sats 96%. GENERAL:  Very pleasant woman who is in a wheelchair.  She is able to move her upper extremities. EYES:  Sclerae nonicteric. HEART:  Regular rate and rhythm without murmurs or gallops. LUNGS:  Clear. ABDOMEN:  Distended, nontender with few bowel sounds.  ASSESSMENT AND PLAN:  Prolonged postoperative ileus of unclear etiology. The patient is currently having bowel movements after MiraLax and Dulcolax suppositories, so I do not feel that she is obstructed.  Her potassium has been running less than 4 for some time and I think that her small bowel with likely function much better with a potassium of 4.5- 5 range.  We will give her additional potassium and follow her labs along in hope that this will improve  her symptoms.          ______________________________ Llana Aliment Malon [name]., M.D.     Waldron Session  D:  09/01/2010  T:  09/01/2010  Job:  [id number]  cc:   Zaliyah Sella. Andrey Campanile, MD [address] [place] [address]  Barbette Hair. [place], DO [address] [place], [place] [address]  Electronically Signed by Carman Ching M.D. on 09/06/2010 07:12:02 AM

## 2010-09-16 NOTE — Discharge Summary (Signed)
[name], [name]                   ACCOUNT NO.:  [id number]  MEDICAL RECORD NO.:  [id number]           PATIENT TYPE:  I  LOCATION:  1607                         FACILITY:  [facility]  PHYSICIAN:  Bren Sella. Andrey Campanile, MD     DATE OF BIRTH:  Feb 15, 1951  DATE OF ADMISSION:  08/25/2010 DATE OF DISCHARGE:  09/03/2010                              DISCHARGE SUMMARY   ADMITTING PHYSICIAN:  Lennie Muckle, MD  DISCHARGING PHYSICIAN:  Saharah Sella. Andrey Campanile, MD.  PRIMARY CARE PHYSICIAN:  Barbette Hair. Artist Pais, DO  GASTROENTEROLOGIST:  Bernette Redbird, M.D.  CONSULTING GASTROENTEROLOGIST:  Llana Aliment. Randa Evens, M.D.  ADMITTING DIAGNOSES: 1. Ileus. 2. Urinary tract infection. 3. 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 4. Neurogenic bladder. 5. Scoliosis.  DISCHARGE DIAGNOSES: 1. Ileus - improved. 2. Urinary tract infection - resolved. 3. Quadriplegia secondary to spinal cord arteriovenous malformation 20     years ago. 4. Neurogenic bladder. 5. Scoliosis.  BRIEF HOSPITAL COURSE:  [name] is a very pleasant 60 year old Caucasian female who has quadriplegia secondary to AVM from 20 years ago who underwent a laparoscopic ventral hernia repair with mesh by me on August 17, 2010.  She was discharged to home on postoperative day #2.  On August 27, 2010, she started having issues with increasing abdominal discomfort.  She also noticed that her abdomen was distended.  She also had decreased urine output.  She came to the emergency room for evaluation.  A CT scan demonstrated dilated loops of small intestine as well as some fluid within the abdomen.  She was also found to have a persistent urinary tract infection.  She had had a urinalysis on the day of her surgery and was found to have urinary tract infection with more than 100,000 colonies of Proteus and Morganella.  She was started on ciprofloxacin and sent home on that.  She was admitted to the  hospital, initially made n.p.o. with bowel rest.  Because her small bowel was grossly distended, a nasogastric tube was placed the following morning with 2 L of bilious output.  Her NG tube really did not drain anything after that.  We clamped it on the hospital day #3 and she tolerated clamping for over 48 hours.  She started on clear liquid diet on Sunday. The NG tube was removed on Sunday.  She was maintained on chemical DVT prophylaxis.  She received IV antibiotics consisting of ciprofloxacin for her urinary tract infection for 5 days.  She was maintained on essentially her home medications with the exception of her Lasix.  We resumed her bowel regimen.  The patient has a neurogenic bladder and has an indwelling Foley.  She also uses an aggressive bowel regimen consisting of drinking 10 g of PEG solution mixed in water every morning followed by suppository along with digital stimulation of her rectum. On Monday this past week, we resumed low dose of MiraLax followed by Dulcolax suppository and she started having bowel movements.  She also started having flatus.  Throughout this past week, she has been tolerating a  full liquid diet and eating approximately 75% to 100% of her diet as well as having large daily bowel movements as well as large amounts of flatus.  She occasionally had some burping at night mainly in the early week.  Because she still continued to have abdominal distention, GI Medicine was consulted.  They recommended running her potassium closer to 5 to help with her ileus.  Followup serial x-rays demonstrated decrease stool burden in her colon but she still had dilated loops of small bowel consistent with an ileus.  There was no air fluid levels.  On Friday on September 03, 2010, she was deemed stable for discharge.  She did still have some abdominal distention that she was tolerating full liquid as well as some small amounts of soft mechanical diet.  She was drinking 2 to 3  Boost shakes a day.  She was having daily large bowel movement with the aid of drinking MiraLax followed by suppository.  She was also having decreased belching episodes.  Her abdomen was soft and nontender.  She was deemed stable for discharge.  We did discuss the possibility that her ileus could worsen at home prompting her to have to be readmitted.  However, I felt that she was taking enough liquids as well as food to meet her daily caloric intake.  DISCHARGE MEDICATIONS:  She is going to continue her home medications of: 1. Tylenol 500 mg p.r.n. 2. Afrin. 3. Alaway eyedrops as needed. 4. Baclofen 10 mg t.i.d. 5. Clobetasol propionate topical solution to her scalp 3 times a week. 6. Valium 5 mg p.r.n. as needed. 7. Multivitamin 1 tablet p.o. daily 8. Oxybutynin 5 mg q.a.m. 9. Polyethylene glycol 3350 10 g every morning. 10.Dulcolax 10 mg suppository daily 11.Pravastatin 40 mg q.h.s. 12.Potassium chloride 10 mg b.i.d.  We are going to have her stop the following medications for now which include Lasix, potassium chloride 40 mEq b.i.d., hydrocodone, Cipro, and oral bisacodyl.  DISCHARGE/PLAN:  She is going to go home on a full liquid to soft mechanical diet.  I have encouraged to eat small meals throughout the day as well as supplemented with Boost shakes as tolerated.  She is going to follow with me in my office late next week either Thursday or Friday.  We are going to check labs on the day prior to monitor her potassium level as well as to monitor for any signs of dehydration.  She is going to continue her bowel regimen at home.  DISCHARGE INSTRUCTIONS:  She is going to call us should she have worsening abdominal pain, decrease urine output, fever, persistent nausea and vomiting or any questions or concerns.  I have made it explicitly clear that it is possible her ileus may worsen at home finding her that she might never warrant readmission to the hospital.  She is  requested that if she should need readmission that she be admitted to the sixth floor at [facility] since she is able to get into a shower a lot easier due to her quadriplegia.     Glen Sella. Andrey Campanile, MD     EMW/MEDQ  D:  09/03/2010  T:  09/03/2010  Job:  [id number]  cc:   Fayrene Fearing L. Malon Kindle., M.D. Fax: [phone]  Bernette Redbird, M.D. Fax: [phone]  Barbette Hair. [place], DO [address] [place], [place] [address]  Electronically Signed by Gaynelle Adu M.D. on 09/16/2010 07:40:55 AM

## 2010-09-27 ENCOUNTER — Other Ambulatory Visit: Payer: Self-pay | Admitting: Internal Medicine

## 2010-09-27 ENCOUNTER — Other Ambulatory Visit (INDEPENDENT_AMBULATORY_CARE_PROVIDER_SITE_OTHER): Payer: Medicare Other

## 2010-09-27 DIAGNOSIS — E785 Hyperlipidemia, unspecified: Secondary | ICD-10-CM

## 2010-09-27 DIAGNOSIS — I10 Essential (primary) hypertension: Secondary | ICD-10-CM

## 2010-09-27 LAB — HEPATIC FUNCTION PANEL
ALT: 12 U/L (ref 0–35)
AST: 13 U/L (ref 0–37)
Albumin: 3.6 g/dL (ref 3.5–5.2)
Alkaline Phosphatase: 85 U/L (ref 39–117)
Bilirubin, Direct: 0.1 mg/dL (ref 0.0–0.3)
Total Bilirubin: 0.7 mg/dL (ref 0.3–1.2)
Total Protein: 7.1 g/dL (ref 6.0–8.3)

## 2010-09-27 LAB — LIPID PANEL
Cholesterol: 152 mg/dL (ref 0–200)
HDL: 41.4 mg/dL (ref >39.00)
LDL Cholesterol: 90 mg/dL (ref 0–99)
Total CHOL/HDL Ratio: 4
Triglycerides: 105 mg/dL (ref 0.0–149.0)
VLDL: 21 mg/dL (ref 0.0–40.0)

## 2010-09-27 LAB — BASIC METABOLIC PANEL
BUN: 9 mg/dL (ref 6–23)
CO2: 29 mEq/L (ref 19–32)
Calcium: 9.1 mg/dL (ref 8.4–10.5)
Chloride: 104 mEq/L (ref 96–112)
Creatinine, Ser: 0.3 mg/dL — ABNORMAL LOW (ref 0.4–1.2)
GFR: 233.91 mL/min (ref >60.00)
Glucose, Bld: 85 mg/dL (ref 70–99)
Potassium: 4.3 mEq/L (ref 3.5–5.1)
Sodium: 140 mEq/L (ref 135–145)

## 2010-09-27 LAB — TSH: TSH: 2.32 u[IU]/mL (ref 0.35–5.50)

## 2010-10-04 ENCOUNTER — Ambulatory Visit (INDEPENDENT_AMBULATORY_CARE_PROVIDER_SITE_OTHER): Payer: Medicare Other | Admitting: Internal Medicine

## 2010-10-04 ENCOUNTER — Encounter: Payer: Self-pay | Admitting: Internal Medicine

## 2010-10-04 DIAGNOSIS — I10 Essential (primary) hypertension: Secondary | ICD-10-CM

## 2010-10-04 DIAGNOSIS — E785 Hyperlipidemia, unspecified: Secondary | ICD-10-CM

## 2010-10-04 MED ORDER — BACLOFEN 20 MG PO TABS: ORAL_TABLET | ORAL | Status: DC

## 2010-10-04 MED ORDER — POTASSIUM CHLORIDE CRYS ER 20 MEQ PO TBCR: EXTENDED_RELEASE_TABLET | ORAL | Status: DC

## 2010-10-04 MED ORDER — PRAVASTATIN SODIUM 40 MG PO TABS: 40.0000 mg | ORAL_TABLET | Freq: Every evening | ORAL | Status: DC

## 2010-10-04 MED ORDER — DIAZEPAM 5 MG PO TABS: 5.0000 mg | ORAL_TABLET | Freq: Every day | ORAL | Status: DC | PRN

## 2010-10-04 MED ORDER — FUROSEMIDE 20 MG PO TABS: 20.0000 mg | ORAL_TABLET | Freq: Two times a day (BID) | ORAL | Status: DC

## 2010-10-04 MED ORDER — OXYBUTYNIN CHLORIDE 5 MG PO TABS: 5.0000 mg | ORAL_TABLET | ORAL | Status: DC

## 2010-10-04 NOTE — Patient Instructions (Signed)
Please complete the following lab tests before your next follow up appointment: BMET - 401.9 

## 2010-10-04 NOTE — Assessment & Plan Note (Addendum)
Stable.  Continue current medication regimen. Lab Results  Component Value Date   CREATININE 0.3* 09/27/2010     Chemistry      Component Value Date/Time   NA 140 09/27/2010 1014   K 4.3 09/27/2010 1014   CL 104 09/27/2010 1014   CO2 29 09/27/2010 1014   BUN 9 09/27/2010 1014   CREATININE 0.3* 09/27/2010 1014      Component Value Date/Time   CALCIUM 9.1 09/27/2010 1014   ALKPHOS 85 09/27/2010 1014   AST 13 09/27/2010 1014   ALT 12 09/27/2010 1014   BILITOT 0.7 09/27/2010 1014       BP: 138/88 mmHg

## 2010-10-04 NOTE — Progress Notes (Signed)
Subjective:    Patient ID: [name], female    DOB: 02-Jan-1951, 60 y.o.   MRN: [id number]  Hyperlipidemia This is a chronic problem. The current episode started more than 1 year ago. The problem is controlled. Recent lipid tests were reviewed and are normal. Factors aggravating her hyperlipidemia include no known factors. Pertinent negatives include no chest pain. Current antihyperlipidemic treatment includes statins. The current treatment provides moderate improvement of lipids. There are no compliance problems.    Interval history-patient admitted for ventral hernia repair. Her hospital course was complicated by postop ileus.  Her symptoms gradually improved/resolved. Patient has resumed her normal diet. There were some issues with skin breakdown during hospitalization however her sister has resumed her normal routine.  She receives excellent care at home.   Review of Systems  Cardiovascular: Negative for chest pain.  no weight change  Past Medical History  Diagnosis Date  . Hyperlipidemia   . Hypertension   . 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 Neurogenic bladder   . Bowel dysfunction     constipation and fecal retention  . History of kidney stones   . Obesity     History   Social History  . Marital Status: Single    Spouse Name: N/A    Number of Children: N/A  . Years of Education: N/A   Occupational History  . Disabled     math teacher   Social History Main Topics  . Smoking status: Never Smoker   . Smokeless tobacco: Not on file  . Alcohol Use: No  . Drug Use: Not on file  . Sexually Active: Not on file   Other Topics Concern  . Not on file   Social History Narrative   Sister is primary caregiver    Past Surgical History  Procedure Date  . Laminectomy 1981    AVM  . Transfer tendon hand 1982, 1983    to right hand  . Rotator cuff repair 1996, 1998    left shoulder  . Total shoulder replacement 2001    left  . Shoulder  arthroscopy 2008    right    Family History  Problem Relation Age of Onset  . Cancer Mother     breast  . Heart disease Father     CAD, MI    No Known Allergies  Current Outpatient Prescriptions on File Prior to Visit  Medication Sig Dispense Refill  . clotrimazole-betamethasone (LOTRISONE) cream Apply topically. Apply twice a day for 2 weeks as needed.       Marland Kitchen HYDROcodone-acetaminophen (VICODIN) 5-500 MG per tablet Take by mouth. Take 1-2 tablets daily as needed.       . Multiple Vitamins-Calcium (ONE-A-DAY WOMENS FORMULA PO) Take 1 tablet by mouth daily.        Marland Kitchen PEG 3350-KCl-NaBcb-NaCl-NaSulf (PEG 3350/ELECTROLYTES) 240 G SOLR Take 5 oz by mouth daily.        Marland Kitchen DISCONTD: baclofen (LIORESAL) 20 MG tablet Take by mouth. Take 1/2 to 1 tablet by mouth three times a day.       Marland Kitchen DISCONTD: diazepam (VALIUM) 5 MG tablet Take by mouth. 1/2 tablet by mouth at bedtime as needed.       Marland Kitchen DISCONTD: furosemide (LASIX) 20 MG tablet Take 20 mg by mouth 2 (two) times daily.        Marland Kitchen DISCONTD: oxybutynin (DITROPAN) 5 MG tablet Take 5 mg by mouth every morning.        Marland Kitchen  DISCONTD: potassium chloride SA (K-DUR,KLOR-CON) 20 MEQ tablet Take by mouth. Take 2 tablets by mouth two times a day.       Marland Kitchen DISCONTD: pravastatin (PRAVACHOL) 40 MG tablet Take 40 mg by mouth every evening.        . ferrous gluconate (FERGON) 240 (27 FE) MG tablet Take 240 mg by mouth every morning.          BP 138/88  Pulse 62  Temp(Src) 98.2 F (36.8 C) (Oral)  Resp 18  SpO2 99%       Objective:   Physical Exam  Constitutional: She is oriented to person, place, and time. She appears well-developed and well-nourished.  Cardiovascular: Normal rate and normal heart sounds.   Pulmonary/Chest: Effort normal and breath sounds normal. She has no wheezes. She has no rales.  Abdominal: Soft. Bowel sounds are normal. She exhibits no mass. There is no tenderness. There is no rebound and no guarding.  Neurological: She is  oriented to person, place, and time.  Skin: Skin is warm and dry.  Psychiatric: She has a normal mood and affect. Her behavior is normal.          Assessment & Plan:

## 2010-10-04 NOTE — Assessment & Plan Note (Signed)
Stable.  No change in medication  Lab Results  Component Value Date   CHOL 152 09/27/2010   CHOL 115 11/18/2008   CHOL 186 08/14/2008   Lab Results  Component Value Date   HDL 41.40 09/27/2010   HDL 04.54* 11/18/2008   HDL 09.8* 08/14/2008   Lab Results  Component Value Date   LDLCALC 90 09/27/2010   LDLCALC 66 11/18/2008   LDLCALC 137* 08/14/2008   Lab Results  Component Value Date   TRIG 105.0 09/27/2010   TRIG 81.0 11/18/2008   TRIG 106 08/14/2008   Lab Results  Component Value Date   CHOLHDL 4 09/27/2010   CHOLHDL 4 11/18/2008   CHOLHDL 6.8 CALC 08/14/2008

## 2010-10-07 ENCOUNTER — Other Ambulatory Visit: Payer: Self-pay | Admitting: *Deleted

## 2010-10-07 MED ORDER — PEG 3350/ELECTROLYTES 240 G PO SOLR: 5.0000 [oz_av] | Freq: Every day | ORAL | Status: DC

## 2010-10-07 NOTE — Telephone Encounter (Signed)
Ok to refill x 5 

## 2010-10-07 NOTE — Telephone Encounter (Signed)
Peg refill sent pharmacy

## 2010-10-07 NOTE — Telephone Encounter (Signed)
Patient called and left voice message stating she was seen in the office , and thought that her peg medication was refilled. She is wanting to know if Dr Artist Pais would refill medication to [facility]

## 2010-11-04 ENCOUNTER — Encounter: Payer: Self-pay | Admitting: Internal Medicine

## 2011-03-22 ENCOUNTER — Other Ambulatory Visit: Payer: Self-pay | Admitting: Dermatology

## 2011-03-23 ENCOUNTER — Other Ambulatory Visit: Payer: Self-pay | Admitting: Internal Medicine

## 2011-03-23 DIAGNOSIS — I10 Essential (primary) hypertension: Secondary | ICD-10-CM

## 2011-03-29 ENCOUNTER — Other Ambulatory Visit: Payer: Medicare Other

## 2011-03-29 LAB — BASIC METABOLIC PANEL
BUN: 10 mg/dL (ref 6–23)
CO2: 23 mEq/L (ref 19–32)
Calcium: 9.1 mg/dL (ref 8.4–10.5)
Chloride: 104 mEq/L (ref 96–112)
Creat: 0.45 mg/dL — ABNORMAL LOW (ref 0.50–1.10)
Glucose, Bld: 99 mg/dL (ref 70–99)
Potassium: 4.2 mEq/L (ref 3.5–5.3)
Sodium: 140 mEq/L (ref 135–145)

## 2011-04-05 ENCOUNTER — Telehealth: Payer: Self-pay | Admitting: Internal Medicine

## 2011-04-05 ENCOUNTER — Ambulatory Visit: Payer: Medicare Other | Admitting: Internal Medicine

## 2011-04-05 ENCOUNTER — Encounter: Payer: Self-pay | Admitting: Internal Medicine

## 2011-04-05 ENCOUNTER — Ambulatory Visit (INDEPENDENT_AMBULATORY_CARE_PROVIDER_SITE_OTHER): Payer: Medicare Other | Admitting: Internal Medicine

## 2011-04-05 DIAGNOSIS — E785 Hyperlipidemia, unspecified: Secondary | ICD-10-CM

## 2011-04-05 DIAGNOSIS — Z1211 Encounter for screening for malignant neoplasm of colon: Secondary | ICD-10-CM

## 2011-04-05 DIAGNOSIS — I10 Essential (primary) hypertension: Secondary | ICD-10-CM

## 2011-04-05 DIAGNOSIS — Z23 Encounter for immunization: Secondary | ICD-10-CM

## 2011-04-05 DIAGNOSIS — Z79899 Other long term (current) drug therapy: Secondary | ICD-10-CM

## 2011-04-05 MED ORDER — BACLOFEN 20 MG PO TABS: ORAL_TABLET | ORAL | Status: DC

## 2011-04-05 MED ORDER — POTASSIUM CHLORIDE CRYS ER 20 MEQ PO TBCR: EXTENDED_RELEASE_TABLET | ORAL | Status: DC

## 2011-04-05 MED ORDER — FUROSEMIDE 20 MG PO TABS: 20.0000 mg | ORAL_TABLET | Freq: Two times a day (BID) | ORAL | Status: DC

## 2011-04-05 MED ORDER — PRAVASTATIN SODIUM 40 MG PO TABS: 40.0000 mg | ORAL_TABLET | Freq: Every evening | ORAL | Status: DC

## 2011-04-05 MED ORDER — DIAZEPAM 5 MG PO TABS: 5.0000 mg | ORAL_TABLET | Freq: Every day | ORAL | Status: DC | PRN

## 2011-04-05 MED ORDER — PEG 3350/ELECTROLYTES 240 G PO SOLR: 5.0000 [oz_av] | Freq: Every day | ORAL | Status: DC

## 2011-04-05 NOTE — Patient Instructions (Signed)
Please schedule cbc, chem7 (v58.69) and lipid(272.4) prior to next visit

## 2011-04-06 LAB — HEPATIC FUNCTION PANEL
ALT: 13 U/L (ref 0–35)
AST: 13 U/L (ref 0–37)
Albumin: 4.1 g/dL (ref 3.5–5.2)
Alkaline Phosphatase: 80 U/L (ref 39–117)
Bilirubin, Direct: 0.1 mg/dL (ref 0.0–0.3)
Indirect Bilirubin: 0.3 mg/dL (ref 0.0–0.9)
Total Bilirubin: 0.4 mg/dL (ref 0.3–1.2)
Total Protein: 7.2 g/dL (ref 6.0–8.3)

## 2011-04-06 NOTE — Telephone Encounter (Signed)
Lab orders entered for [place] for April 2013. 

## 2011-04-10 ENCOUNTER — Encounter: Payer: Self-pay | Admitting: Internal Medicine

## 2011-04-10 DIAGNOSIS — Z1211 Encounter for screening for malignant neoplasm of colon: Secondary | ICD-10-CM | POA: Insufficient documentation

## 2011-04-10 NOTE — Progress Notes (Signed)
  Subjective:    Patient ID: [name], female    DOB: May 21, 1951, 60 y.o.   MRN: [id number]  HPI Pt presents to clinic for followup of multiple medical problems. BP reviewed as normotensive. Has not undergone colonoscopy for screening but is quadriplegic and has questions about the prep. Mamm utd. Tolerates statin therapy without abn lfts. No new complaints.  Past Medical History  Diagnosis Date  . Hyperlipidemia   . Hypertension   . 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 Neurogenic bladder   . Bowel dysfunction     constipation and fecal retention  . History of kidney stones   . Obesity    Past Surgical History  Procedure Date  . Laminectomy 1981    AVM  . Transfer tendon hand 1982, 1983    to right hand  . Rotator cuff repair 1996, 1998    left shoulder  . Total shoulder replacement 2001    left  . Shoulder arthroscopy 2008    right  . Ventral hernia repair 08/17/2010    Postop ileus (Dr. Andrey Campanile)    reports that she has never smoked. She has never used smokeless tobacco. She reports that she does not drink alcohol. Her drug history not on file. family history includes Cancer in her mother and Heart disease in her father. No Known Allergies   Review of Systems see hpi     Objective:   Physical Exam  Physical Exam  Nursing note and vitals reviewed. Constitutional: Appears well-developed and well-nourished. No distress.  HENT:  Head: Normocephalic and atraumatic.  Right Ear: External ear normal.  Left Ear: External ear normal.  Eyes: Conjunctivae are normal. No scleral icterus.  Neck: Neck supple. Carotid bruit is not present.  Cardiovascular: Normal rate, regular rhythm and normal heart sounds.  Exam reveals no gallop and no friction rub.   No murmur heard. Pulmonary/Chest: Effort normal and breath sounds normal. No respiratory distress. He has no wheezes. no rales.  Lymphadenopathy:    He has no cervical adenopathy.  Neurological:Alert.    Skin: Skin is warm and dry. Not diaphoretic.  Psychiatric: Has a normal mood and affect.         Assessment & Plan:

## 2011-04-10 NOTE — Assessment & Plan Note (Signed)
Stable. Cont statin tx. Obtain lipid/lft prior to next visit.

## 2011-04-10 NOTE — Assessment & Plan Note (Signed)
GI consult

## 2011-09-27 ENCOUNTER — Other Ambulatory Visit: Payer: Self-pay | Admitting: *Deleted

## 2011-09-27 DIAGNOSIS — E785 Hyperlipidemia, unspecified: Secondary | ICD-10-CM

## 2011-09-27 DIAGNOSIS — Z79899 Other long term (current) drug therapy: Secondary | ICD-10-CM

## 2011-09-27 LAB — CBC
HCT: 42.8 % (ref 36.0–46.0)
Hemoglobin: 13.8 g/dL (ref 12.0–15.0)
MCH: 27.5 pg (ref 26.0–34.0)
MCHC: 32.2 g/dL (ref 30.0–36.0)
MCV: 85.3 fL (ref 78.0–100.0)
Platelets: 225 10*3/uL (ref 150–400)
RBC: 5.02 MIL/uL (ref 3.87–5.11)
RDW: 13.8 % (ref 11.5–15.5)
WBC: 5.6 10*3/uL (ref 4.0–10.5)

## 2011-09-27 LAB — LIPID PANEL
Cholesterol: 146 mg/dL (ref 0–200)
HDL: 38 mg/dL — ABNORMAL LOW (ref >39)
LDL Cholesterol: 83 mg/dL (ref 0–99)
Total CHOL/HDL Ratio: 3.8 Ratio
Triglycerides: 125 mg/dL (ref <150)
VLDL: 25 mg/dL (ref 0–40)

## 2011-09-27 LAB — BASIC METABOLIC PANEL
BUN: 9 mg/dL (ref 6–23)
CO2: 28 mEq/L (ref 19–32)
Calcium: 8.9 mg/dL (ref 8.4–10.5)
Chloride: 106 mEq/L (ref 96–112)
Creat: 0.43 mg/dL — ABNORMAL LOW (ref 0.50–1.10)
Glucose, Bld: 95 mg/dL (ref 70–99)
Potassium: 4.8 mEq/L (ref 3.5–5.3)
Sodium: 142 mEq/L (ref 135–145)

## 2011-10-04 ENCOUNTER — Ambulatory Visit (INDEPENDENT_AMBULATORY_CARE_PROVIDER_SITE_OTHER): Payer: Medicare Other | Admitting: Internal Medicine

## 2011-10-04 ENCOUNTER — Encounter: Payer: Self-pay | Admitting: Internal Medicine

## 2011-10-04 ENCOUNTER — Telehealth: Payer: Self-pay | Admitting: Internal Medicine

## 2011-10-04 VITALS — BP 118/78 | HR 70 | Temp 98.0°F

## 2011-10-04 DIAGNOSIS — E785 Hyperlipidemia, unspecified: Secondary | ICD-10-CM

## 2011-10-04 DIAGNOSIS — I10 Essential (primary) hypertension: Secondary | ICD-10-CM

## 2011-10-04 MED ORDER — BACLOFEN 20 MG PO TABS: ORAL_TABLET | ORAL | Status: DC

## 2011-10-04 MED ORDER — POTASSIUM CHLORIDE CRYS ER 20 MEQ PO TBCR: EXTENDED_RELEASE_TABLET | ORAL | Status: DC

## 2011-10-04 MED ORDER — PRAVASTATIN SODIUM 40 MG PO TABS: 40.0000 mg | ORAL_TABLET | Freq: Every evening | ORAL | Status: DC

## 2011-10-04 MED ORDER — FUROSEMIDE 20 MG PO TABS: 20.0000 mg | ORAL_TABLET | Freq: Two times a day (BID) | ORAL | Status: DC

## 2011-10-04 NOTE — Patient Instructions (Signed)
Please schedule fasting lipid 272.4 prior to next visit 

## 2011-10-04 NOTE — Telephone Encounter (Signed)
Lab order entered for July 2013. 

## 2011-10-04 NOTE — Progress Notes (Signed)
  Subjective:    Patient ID: [name], female    DOB: [birth year], 61 y.o.   MRN: [id number]  HPI Pt presents to clinic for followup of multiple medical problems. S/p gi evaluation without recommendation for colonoscopy. Had concerns over skin breakdown with prep and sedation. Is on bowel regimen and is stable. There was discussion over possible colostomy in future if necessary. Reviewed lipid panel under good control with exception of mildly low hdl. BP reviewed normotensive.   Past Medical History  Diagnosis Date  . Hyperlipidemia   . Hypertension   . 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 Neurogenic bladder   . Bowel dysfunction     constipation and fecal retention  . History of kidney stones   . Obesity    Past Surgical History  Procedure Date  . Laminectomy 1981    AVM  . Transfer tendon hand 1982, 1983    to right hand  . Rotator cuff repair 1996, 1998    left shoulder  . Total shoulder replacement 2001    left  . Shoulder arthroscopy 2008    right  . Ventral hernia repair 08/17/2010    Postop ileus (Dr. Andrey Campanile)    reports that she has never smoked. She has never used smokeless tobacco. She reports that she does not drink alcohol. Her drug history not on file. family history includes Cancer in her mother and Heart disease in her father. No Known Allergies    Review of Systems see hpi     Objective:   Physical Exam  Physical Exam  Nursing note and vitals reviewed. Constitutional: Appears well-developed and well-nourished. No distress. presents in [facility] HENT:  Head: Normocephalic and atraumatic.  Right Ear: External ear normal.  Left Ear: External ear normal.  Eyes: Conjunctivae are normal. No scleral icterus.  Neck: Neck supple. Carotid bruit is not present.  Cardiovascular: Normal rate, regular rhythm and normal heart sounds.  Exam reveals no gallop and no friction rub.   No murmur heard. Pulmonary/Chest: Effort normal and breath sounds  normal. No respiratory distress. He has no wheezes. no rales.  Lymphadenopathy:    He has no cervical adenopathy.  Neurological:Alert.  Skin: Skin is warm and dry. Not diaphoretic.  Psychiatric: Has a normal mood and affect.        Assessment & Plan:

## 2011-10-04 NOTE — Assessment & Plan Note (Signed)
Normotensive and stable. Continue current regimen. Monitor bp as outpt and followup in clinic as scheduled.  

## 2011-10-04 NOTE — Assessment & Plan Note (Signed)
Decrease pravachol 40mg  1/2 tablet daily. Obtain lipid prior to next visit

## 2011-10-31 ENCOUNTER — Other Ambulatory Visit: Payer: Self-pay | Admitting: Dermatology

## 2011-12-27 LAB — LIPID PANEL
Cholesterol: 163 mg/dL (ref 0–200)
HDL: 42 mg/dL (ref >39)
LDL Cholesterol: 99 mg/dL (ref 0–99)
Total CHOL/HDL Ratio: 3.9 Ratio
Triglycerides: 108 mg/dL (ref <150)
VLDL: 22 mg/dL (ref 0–40)

## 2011-12-27 NOTE — Telephone Encounter (Signed)
Pt presented to the lab, future order released. 

## 2011-12-27 NOTE — Addendum Note (Signed)
Addended by: Mervin Kung A on: 12/27/2011 12:00 PM   Modules accepted: Orders

## 2012-01-03 ENCOUNTER — Telehealth: Payer: Self-pay | Admitting: Internal Medicine

## 2012-01-03 ENCOUNTER — Encounter: Payer: Self-pay | Admitting: Internal Medicine

## 2012-01-03 ENCOUNTER — Ambulatory Visit (INDEPENDENT_AMBULATORY_CARE_PROVIDER_SITE_OTHER): Payer: Medicare Other | Admitting: Internal Medicine

## 2012-01-03 VITALS — BP 122/78 | HR 98 | Temp 98.3°F | Resp 14

## 2012-01-03 DIAGNOSIS — I10 Essential (primary) hypertension: Secondary | ICD-10-CM

## 2012-01-03 DIAGNOSIS — J310 Chronic rhinitis: Secondary | ICD-10-CM

## 2012-01-03 DIAGNOSIS — E785 Hyperlipidemia, unspecified: Secondary | ICD-10-CM

## 2012-01-03 DIAGNOSIS — Z79899 Other long term (current) drug therapy: Secondary | ICD-10-CM

## 2012-01-03 MED ORDER — FLUTICASONE PROPIONATE 50 MCG/ACT NA SUSP: 2.0000 | Freq: Every day | NASAL | Status: DC

## 2012-01-03 MED ORDER — PRAVASTATIN SODIUM 20 MG PO TABS: 20.0000 mg | ORAL_TABLET | Freq: Every evening | ORAL | Status: DC

## 2012-01-03 NOTE — Patient Instructions (Signed)
Please schedule fasting labs prior to next visit  Cbc, tsh-401.9, chem7-v58.69, lipid/lft-272.4

## 2012-01-03 NOTE — Assessment & Plan Note (Signed)
Good control. Obtain cbc, chem7 prior to next visit

## 2012-01-03 NOTE — Assessment & Plan Note (Signed)
Adequate control. Decrease statin dose to minimum and recheck lipid/lft prior to next visit

## 2012-01-03 NOTE — Assessment & Plan Note (Signed)
Attempt flonase. Followup if no improvement or worsening.  

## 2012-01-03 NOTE — Progress Notes (Signed)
  Subjective:    Patient ID: [name], female    DOB: [birth year], 61 y.o.   MRN: [id number]  HPI Pt presents to clinic for followup of multiple medical problems. Cholesterol reviewed nl despite statin dose reduction. Notes intermittent nighttime right nasal congestion without drainage. Taking no medication for the problem currently though has used otc nasal decongestant in the past.   Past Medical History  Diagnosis Date  . Hyperlipidemia   . Hypertension   . 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 Neurogenic bladder   . Bowel dysfunction     constipation and fecal retention  . History of kidney stones   . Obesity    Past Surgical History  Procedure Date  . Laminectomy 1981    AVM  . Transfer tendon hand 1982, 1983    to right hand  . Rotator cuff repair 1996, 1998    left shoulder  . Total shoulder replacement 2001    left  . Shoulder arthroscopy 2008    right  . Ventral hernia repair 08/17/2010    Postop ileus (Dr. Andrey Campanile)    reports that she has never smoked. She has never used smokeless tobacco. She reports that she does not drink alcohol. Her drug history not on file. family history includes Cancer in her mother and Heart disease in her father. Allergies  Allergen Reactions  . Adhesive (Tape) Other (See Comments)    Skin Irritant.      Review of Systems see hpi     Objective:   Physical Exam  Nursing note and vitals reviewed. Constitutional: She appears well-developed and well-nourished. No distress.  HENT:  Head: Normocephalic and atraumatic.  Nose: Mucosal edema present. No nasal deformity.  Neurological: She is alert.  Skin: Skin is warm and dry. She is not diaphoretic.  Psychiatric: She has a normal mood and affect.          Assessment & Plan:

## 2012-01-28 ENCOUNTER — Encounter: Payer: Self-pay | Admitting: Internal Medicine

## 2012-02-16 NOTE — Telephone Encounter (Signed)
Future lab orders entered per below and given to the lab:  Please schedule fasting labs prior to next visit  Cbc, tsh-401.9, chem7-v58.69, lipid/lft-272.4

## 2012-03-05 ENCOUNTER — Telehealth: Payer: Self-pay | Admitting: *Deleted

## 2012-03-05 NOTE — Telephone Encounter (Signed)
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

## 2012-03-07 NOTE — Telephone Encounter (Signed)
Physician Orders completed & faxed to requested facilities/[facility]

## 2012-04-18 ENCOUNTER — Ambulatory Visit (INDEPENDENT_AMBULATORY_CARE_PROVIDER_SITE_OTHER): Payer: Medicare Other

## 2012-04-18 DIAGNOSIS — Z23 Encounter for immunization: Secondary | ICD-10-CM

## 2012-05-21 ENCOUNTER — Telehealth: Payer: Self-pay | Admitting: *Deleted

## 2012-05-21 DIAGNOSIS — E785 Hyperlipidemia, unspecified: Secondary | ICD-10-CM

## 2012-05-21 DIAGNOSIS — Z79899 Other long term (current) drug therapy: Secondary | ICD-10-CM

## 2012-05-21 LAB — CBC WITH DIFFERENTIAL/PLATELET
Basophils Absolute: 0.1 10*3/uL (ref 0.0–0.1)
Basophils Relative: 1 % (ref 0–1)
Eosinophils Absolute: 0.1 10*3/uL (ref 0.0–0.7)
Eosinophils Relative: 2 % (ref 0–5)
HCT: 42.2 % (ref 36.0–46.0)
Hemoglobin: 13.7 g/dL (ref 12.0–15.0)
Lymphocytes Relative: 36 % (ref 12–46)
Lymphs Abs: 2.2 10*3/uL (ref 0.7–4.0)
MCH: 27.5 pg (ref 26.0–34.0)
MCHC: 32.5 g/dL (ref 30.0–36.0)
MCV: 84.6 fL (ref 78.0–100.0)
Monocytes Absolute: 0.4 10*3/uL (ref 0.1–1.0)
Monocytes Relative: 7 % (ref 3–12)
Neutro Abs: 3.5 10*3/uL (ref 1.7–7.7)
Neutrophils Relative %: 54 % (ref 43–77)
Platelets: 223 10*3/uL (ref 150–400)
RBC: 4.99 MIL/uL (ref 3.87–5.11)
RDW: 14.1 % (ref 11.5–15.5)
WBC: 6.3 10*3/uL (ref 4.0–10.5)

## 2012-05-21 LAB — BASIC METABOLIC PANEL
BUN: 7 mg/dL (ref 6–23)
CO2: 28 mEq/L (ref 19–32)
Calcium: 9.3 mg/dL (ref 8.4–10.5)
Chloride: 104 mEq/L (ref 96–112)
Creat: 0.39 mg/dL — ABNORMAL LOW (ref 0.50–1.10)
Glucose, Bld: 84 mg/dL (ref 70–99)
Potassium: 4.1 mEq/L (ref 3.5–5.3)
Sodium: 139 mEq/L (ref 135–145)

## 2012-05-21 LAB — HEPATIC FUNCTION PANEL
ALT: 10 U/L (ref 0–35)
AST: 10 U/L (ref 0–37)
Albumin: 3.8 g/dL (ref 3.5–5.2)
Alkaline Phosphatase: 76 U/L (ref 39–117)
Bilirubin, Direct: 0.1 mg/dL (ref 0.0–0.3)
Indirect Bilirubin: 0.2 mg/dL (ref 0.0–0.9)
Total Bilirubin: 0.3 mg/dL (ref 0.3–1.2)
Total Protein: 6.8 g/dL (ref 6.0–8.3)

## 2012-05-21 LAB — LIPID PANEL
Cholesterol: 168 mg/dL (ref 0–200)
HDL: 44 mg/dL (ref >39)
LDL Cholesterol: 91 mg/dL (ref 0–99)
Total CHOL/HDL Ratio: 3.8 Ratio
Triglycerides: 165 mg/dL — ABNORMAL HIGH (ref <150)
VLDL: 33 mg/dL (ref 0–40)

## 2012-05-21 LAB — TSH: TSH: 2.932 u[IU]/mL (ref 0.350–4.500)

## 2012-05-21 NOTE — Telephone Encounter (Signed)
Pt presented to the lab, future orders released. 

## 2012-05-28 ENCOUNTER — Ambulatory Visit (INDEPENDENT_AMBULATORY_CARE_PROVIDER_SITE_OTHER): Payer: Medicare Other | Admitting: Internal Medicine

## 2012-05-28 ENCOUNTER — Encounter: Payer: Self-pay | Admitting: Internal Medicine

## 2012-05-28 VITALS — BP 124/72 | HR 75 | Temp 97.5°F | Resp 16

## 2012-05-28 DIAGNOSIS — E785 Hyperlipidemia, unspecified: Secondary | ICD-10-CM

## 2012-05-28 NOTE — Patient Instructions (Signed)
Please return to lab fasting in approximately 8-10 weeks for lipid 272.4

## 2012-06-13 HISTORY — PX: OTHER SURGICAL HISTORY: SHX169

## 2012-06-13 NOTE — Progress Notes (Signed)
  Subjective:    Patient ID: [name], female    DOB: 03-26-1951, 62 y.o.   MRN: [id number]  HPI Pt presents to clinic for followup of multiple medical problems. Chol under good control with low dose statin. Tolerating without side effect.   Past Medical History  Diagnosis Date  . Hyperlipidemia   . Hypertension   . 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 Neurogenic bladder   . Bowel dysfunction     constipation and fecal retention  . History of kidney stones   . Obesity    Past Surgical History  Procedure Date  . Laminectomy 1981    AVM  . Transfer tendon hand 1982, 1983    to right hand  . Rotator cuff repair 1996, 1998    left shoulder  . Total shoulder replacement 2001    left  . Shoulder arthroscopy 2008    right  . Ventral hernia repair 08/17/2010    Postop ileus (Dr. Andrey Campanile)    reports that she has never smoked. She has never used smokeless tobacco. She reports that she does not drink alcohol. Her drug history not on file. family history includes Cancer in her mother and Heart disease in her father. Allergies  Allergen Reactions  . Adhesive (Tape) Other (See Comments)    Skin Irritant.      Review of Systems see hpi     Objective:   Physical Exam  Nursing note and vitals reviewed. Constitutional: She appears well-developed and well-nourished. No distress.  HENT:  Head: Normocephalic and atraumatic.  Neurological: She is alert.  Skin: She is not diaphoretic.  Psychiatric: She has a normal mood and affect.          Assessment & Plan:

## 2012-06-13 NOTE — Assessment & Plan Note (Signed)
Stop statin. Recheck lipid in 8-10 wks

## 2012-06-29 ENCOUNTER — Telehealth: Payer: Self-pay | Admitting: Internal Medicine

## 2012-06-29 MED ORDER — BACLOFEN 20 MG PO TABS: ORAL_TABLET | ORAL | Status: DC

## 2012-06-29 NOTE — Telephone Encounter (Signed)
ok 

## 2012-06-29 NOTE — Telephone Encounter (Signed)
Refill- baclofen 20mg  tab. Take 1/2-1 tablet by mouth three times daily. Qty 90 last fill 10.26.13

## 2012-06-29 NOTE — Telephone Encounter (Signed)
Rx to pharmacy/[facility] 

## 2012-07-24 ENCOUNTER — Telehealth: Payer: Self-pay | Admitting: Internal Medicine

## 2012-07-24 MED ORDER — FUROSEMIDE 20 MG PO TABS: 20.0000 mg | ORAL_TABLET | Freq: Two times a day (BID) | ORAL | Status: DC

## 2012-07-24 NOTE — Telephone Encounter (Signed)
RX sent

## 2012-07-24 NOTE — Telephone Encounter (Signed)
Refill- furosemide 20mg  tab. Take one tablet by mouth twice daily. Qty 180 last fill 11.13.13

## 2012-07-31 ENCOUNTER — Telehealth: Payer: Self-pay | Admitting: *Deleted

## 2012-07-31 DIAGNOSIS — E785 Hyperlipidemia, unspecified: Secondary | ICD-10-CM

## 2012-07-31 NOTE — Telephone Encounter (Signed)
Pt presented to the lab and order entered per 05/28/13 office note as below:  Please return to lab fasting in approximately 8-10 weeks for lipid 272.4

## 2012-08-01 LAB — LIPID PANEL
Cholesterol: 206 mg/dL — ABNORMAL HIGH (ref 0–200)
HDL: 40 mg/dL (ref >39)
LDL Cholesterol: 134 mg/dL — ABNORMAL HIGH (ref 0–99)
Total CHOL/HDL Ratio: 5.2 Ratio
Triglycerides: 159 mg/dL — ABNORMAL HIGH (ref <150)
VLDL: 32 mg/dL (ref 0–40)

## 2012-08-06 ENCOUNTER — Telehealth: Payer: Self-pay | Admitting: *Deleted

## 2012-08-06 NOTE — Telephone Encounter (Signed)
Pt left message on voicemail requesting recent lab results.  Please advise.

## 2012-08-06 NOTE — Telephone Encounter (Signed)
Notify cholesterol is up slighlty, OK to send patient a copy of labs. Avoid trans fats and come in if want to discuss

## 2012-08-07 NOTE — Telephone Encounter (Signed)
Pt informed and voiced understanding

## 2012-08-29 ENCOUNTER — Telehealth: Payer: Self-pay | Admitting: Internal Medicine

## 2012-08-29 MED ORDER — POTASSIUM CHLORIDE CRYS ER 20 MEQ PO TBCR: EXTENDED_RELEASE_TABLET | ORAL | Status: DC

## 2012-08-29 NOTE — Telephone Encounter (Signed)
Potassium chloride 20 meq ter #360 take 2 tablets by mouth twice daily

## 2012-10-26 ENCOUNTER — Telehealth: Payer: Self-pay | Admitting: Family Medicine

## 2012-10-26 MED ORDER — FUROSEMIDE 20 MG PO TABS: 20.0000 mg | ORAL_TABLET | Freq: Two times a day (BID) | ORAL | Status: DC

## 2012-10-26 NOTE — Telephone Encounter (Signed)
Refill- furosemide 20mg  tab. Take one tablet by mouth twice daily. Qty 180 last fill 2.11.14

## 2012-10-26 NOTE — Telephone Encounter (Signed)
Refill sent.

## 2012-11-21 ENCOUNTER — Encounter: Payer: Self-pay | Admitting: Family Medicine

## 2012-11-21 ENCOUNTER — Telehealth: Payer: Self-pay

## 2012-11-21 DIAGNOSIS — I10 Essential (primary) hypertension: Secondary | ICD-10-CM

## 2012-11-21 DIAGNOSIS — E785 Hyperlipidemia, unspecified: Secondary | ICD-10-CM

## 2012-11-21 NOTE — Telephone Encounter (Signed)
Lipid, renal, cbc, tsh, hepatic for hyperlipidemia and HTN

## 2012-11-21 NOTE — Telephone Encounter (Signed)
Pt came in today and gave blood.  I see on 05-28-12 Dr Rodena Medin asked pt to come back in 8-10 weeks for a lipid.  Pt did Lipid labs on 07-31-12.  Please advise if there are any other labs?

## 2012-11-22 LAB — RENAL FUNCTION PANEL
Albumin: 3.8 g/dL (ref 3.5–5.2)
BUN: 7 mg/dL (ref 6–23)
CO2: 27 mEq/L (ref 19–32)
Calcium: 9.2 mg/dL (ref 8.4–10.5)
Chloride: 104 mEq/L (ref 96–112)
Creat: 0.43 mg/dL — ABNORMAL LOW (ref 0.50–1.10)
Glucose, Bld: 98 mg/dL (ref 70–99)
Phosphorus: 3.5 mg/dL (ref 2.3–4.6)
Potassium: 4.1 mEq/L (ref 3.5–5.3)
Sodium: 138 mEq/L (ref 135–145)

## 2012-11-22 LAB — LIPID PANEL
Cholesterol: 210 mg/dL — ABNORMAL HIGH (ref 0–200)
HDL: 40 mg/dL (ref >39)
LDL Cholesterol: 141 mg/dL — ABNORMAL HIGH (ref 0–99)
Total CHOL/HDL Ratio: 5.3 Ratio
Triglycerides: 147 mg/dL (ref <150)
VLDL: 29 mg/dL (ref 0–40)

## 2012-11-22 LAB — TSH: TSH: 2.307 u[IU]/mL (ref 0.350–4.500)

## 2012-11-22 LAB — HEPATIC FUNCTION PANEL
ALT: 10 U/L (ref 0–35)
AST: 11 U/L (ref 0–37)
Albumin: 3.8 g/dL (ref 3.5–5.2)
Alkaline Phosphatase: 94 U/L (ref 39–117)
Bilirubin, Direct: 0.1 mg/dL (ref 0.0–0.3)
Indirect Bilirubin: 0.2 mg/dL (ref 0.0–0.9)
Total Bilirubin: 0.3 mg/dL (ref 0.3–1.2)
Total Protein: 6.6 g/dL (ref 6.0–8.3)

## 2012-11-22 LAB — CBC
HCT: 39.5 % (ref 36.0–46.0)
Hemoglobin: 13.9 g/dL (ref 12.0–15.0)
MCH: 27.3 pg (ref 26.0–34.0)
MCHC: 35.2 g/dL (ref 30.0–36.0)
MCV: 77.6 fL — ABNORMAL LOW (ref 78.0–100.0)
Platelets: 240 10*3/uL (ref 150–400)
RBC: 5.09 MIL/uL (ref 3.87–5.11)
RDW: 14.3 % (ref 11.5–15.5)
WBC: 5.6 10*3/uL (ref 4.0–10.5)

## 2012-11-26 ENCOUNTER — Ambulatory Visit (INDEPENDENT_AMBULATORY_CARE_PROVIDER_SITE_OTHER): Payer: Medicare FFS | Admitting: Family Medicine

## 2012-11-26 ENCOUNTER — Encounter: Payer: Self-pay | Admitting: Family Medicine

## 2012-11-26 VITALS — BP 130/80 | HR 69 | Temp 98.1°F

## 2012-11-26 DIAGNOSIS — G8250 Quadriplegia, unspecified: Secondary | ICD-10-CM

## 2012-11-26 DIAGNOSIS — D649 Anemia, unspecified: Secondary | ICD-10-CM

## 2012-11-26 DIAGNOSIS — Z23 Encounter for immunization: Secondary | ICD-10-CM

## 2012-11-26 DIAGNOSIS — M25519 Pain in unspecified shoulder: Secondary | ICD-10-CM

## 2012-11-26 DIAGNOSIS — Z2911 Encounter for prophylactic immunotherapy for respiratory syncytial virus (RSV): Secondary | ICD-10-CM

## 2012-11-26 DIAGNOSIS — I059 Rheumatic mitral valve disease, unspecified: Secondary | ICD-10-CM

## 2012-11-26 DIAGNOSIS — L8990 Pressure ulcer of unspecified site, unspecified stage: Secondary | ICD-10-CM

## 2012-11-26 DIAGNOSIS — L89159 Pressure ulcer of sacral region, unspecified stage: Secondary | ICD-10-CM

## 2012-11-26 DIAGNOSIS — L89109 Pressure ulcer of unspecified part of back, unspecified stage: Secondary | ICD-10-CM

## 2012-11-26 DIAGNOSIS — N319 Neuromuscular dysfunction of bladder, unspecified: Secondary | ICD-10-CM

## 2012-11-26 DIAGNOSIS — E785 Hyperlipidemia, unspecified: Secondary | ICD-10-CM

## 2012-11-26 DIAGNOSIS — I10 Essential (primary) hypertension: Secondary | ICD-10-CM

## 2012-11-26 MED ORDER — DIAZEPAM 5 MG PO TABS: 5.0000 mg | ORAL_TABLET | Freq: Every day | ORAL | Status: DC | PRN

## 2012-11-26 MED ORDER — POTASSIUM CHLORIDE CRYS ER 20 MEQ PO TBCR: EXTENDED_RELEASE_TABLET | ORAL | Status: DC

## 2012-11-26 MED ORDER — HYDROCODONE-ACETAMINOPHEN 5-325 MG PO TABS: 1.0000 | ORAL_TABLET | Freq: Four times a day (QID) | ORAL | Status: DC | PRN

## 2012-11-26 MED ORDER — FUROSEMIDE 20 MG PO TABS: 20.0000 mg | ORAL_TABLET | Freq: Two times a day (BID) | ORAL | Status: DC

## 2012-11-26 MED ORDER — BACLOFEN 20 MG PO TABS: ORAL_TABLET | ORAL | Status: DC

## 2012-11-26 NOTE — Patient Instructions (Addendum)
Consider a probiotic daily such as Digestive Ad[employer]age by [employer]  No trans fats/partially hydrogenated oils in breads, crackers, frozen foods, peanut butter   Labs prior to visit, renal, cbc, tsh, hepatic,  Ghrelin, Leptin hormones that cause  GMO  Cholesterol Cholesterol is a white, waxy, fat-like protein needed by your body in small amounts. The liver makes all the cholesterol you need. It is carried from the liver by the blood through the blood vessels. Deposits (plaque) may build up on blood vessel walls. This makes the arteries narrower and stiffer. Plaque increases the risk for heart attack and stroke. You cannot feel your cholesterol level even if it is very high. The only way to know is by a blood test to check your lipid (fats) levels. Once you know your cholesterol levels, you should keep a record of the test results. Work with your caregiver to to keep your levels in the desired range. WHAT THE RESULTS MEAN:  Total cholesterol is a rough measure of all the cholesterol in your blood.  LDL is the so-called bad cholesterol. This is the type that deposits cholesterol in the walls of the arteries. You want this level to be low.  HDL is the good cholesterol because it cleans the arteries and carries the LDL away. You want this level to be high.  Triglycerides are fat that the body can either burn for energy or store. High levels are closely linked to heart disease. DESIRED LEVELS:  Total cholesterol below 200.  LDL below 100 for people at risk, below 70 for very high risk.  HDL above 50 is good, above 60 is best.  Triglycerides below 150. HOW TO LOWER YOUR CHOLESTEROL:  Diet.  Choose fish or white meat chicken and [place], roasted or baked. Limit fatty cuts of red meat, fried foods, and processed meats, such as sausage and lunch meat.  Eat lots of fresh fruits and vegetables. Choose whole grains, beans, pasta, potatoes and cereals.  Use only small amounts of olive, corn or  canola oils. Avoid butter, mayonnaise, shortening or palm kernel oils. Avoid foods with trans-fats.  Use skim/nonfat milk and low-fat/nonfat yogurt and cheeses. Avoid whole milk, cream, ice cream, egg yolks and cheeses. Healthy desserts include angel food cake, ginger snaps, animal crackers, hard candy, popsicles, and low-fat/nonfat frozen yogurt. Avoid pastries, cakes, pies and cookies.  Exercise.  A regular program helps decrease LDL and raises HDL.  Helps with weight control.  Do things that increase your activity level like gardening, walking, or taking the stairs.  Medication.  May be prescribed by your caregiver to help lowering cholesterol and the risk for heart disease.  You may need medicine even if your levels are normal if you have several risk factors. HOME CARE INSTRUCTIONS   Follow your diet and exercise programs as suggested by your caregiver.  Take medications as directed.  Have blood work done when your caregiver feels it is necessary. MAKE SURE YOU:   Understand these instructions.  Will watch your condition.  Will get help right away if you are not doing well or get worse. Document Released: 02/22/2001 Document Revised: 08/22/2011 Document Reviewed: 08/15/2007 [facility] Patient Information 2014 [place], [place].

## 2012-11-27 ENCOUNTER — Other Ambulatory Visit: Payer: Self-pay | Admitting: Family Medicine

## 2012-11-27 DIAGNOSIS — G8250 Quadriplegia, unspecified: Secondary | ICD-10-CM

## 2012-11-28 ENCOUNTER — Encounter: Payer: Self-pay | Admitting: Family Medicine

## 2012-11-28 NOTE — Progress Notes (Signed)
Patient ID: [name], female   DOB: 1950-07-26, 62 y.o.   MRN: [id number] [name] [id number] 12-26-1950 11/28/2012      Progress Note-Follow Up  Subjective  Chief Complaint  Chief Complaint  Patient presents with  . Follow-up    6 month    HPI  Patient is a 62 year old Caucasian female who is in today for ongoing care. She suffered an AVM in her spine back in her 49s, in 78. Is now wheelchair-bound and suffered with neurogenic bladder and bowel concerns. She falls with numerous specialists. Maintain meticulous self care. Has not had any recent illness. No recent illness. No fevers, chills, skin breakdown, palpitations, shortness of breath, GI or GU complaints. Has suffered with some intermittent yeast infections but responds Monistat  Past Medical History  Diagnosis Date  . Hyperlipidemia   . Hypertension   . 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 Neurogenic bladder   . Bowel dysfunction     constipation and fecal retention  . History of kidney stones   . Obesity     Past Surgical History  Procedure Laterality Date  . Laminectomy  1981    AVM  . Transfer tendon hand  1982, 1983    to right hand  . Rotator cuff repair  1996, 1998    left shoulder  . Total shoulder replacement  2001    left  . Shoulder arthroscopy  2008    right  . Ventral hernia repair  08/17/2010    Postop ileus (Dr. Andrey Campanile)  . Hernia repair  2012    umbilical hernia repair    Family History  Problem Relation Age of Onset  . Cancer Mother     breast  . Heart disease Father     CAD, MI    History   Social History  . Marital Status: Single    Spouse Name: N/A    Number of Children: N/A  . Years of Education: N/A   Occupational History  . Disabled     math teacher   Social History Main Topics  . Smoking status: Never Smoker   . Smokeless tobacco: Never Used  . Alcohol Use: No  . Drug Use: Not on file  . Sexually Active: Not on file   Other Topics Concern   . Not on file   Social History Narrative   Sister is primary caregiver    Current Outpatient Prescriptions on File Prior to Visit  Medication Sig Dispense Refill  . HYDROcodone-acetaminophen (VICODIN) 5-500 MG per tablet Take by mouth. Take 1-2 tablets daily as needed.      . Multiple Vitamins-Calcium (ONE-A-DAY WOMENS FORMULA PO) Take 1 tablet by mouth daily.        Marland Kitchen oxybutynin (DITROPAN) 5 MG tablet Take 5 mg by mouth every morning.        . pravastatin (PRAVACHOL) 20 MG tablet Take 10 mg by mouth every evening.       No current facility-administered medications on file prior to visit.    Allergies  Allergen Reactions  . Adhesive (Tape) Other (See Comments)    Skin Irritant.    Review of Systems  Review of Systems  Constitutional: Negative for fever and malaise/fatigue.  HENT: Negative for congestion.   Eyes: Negative for discharge.  Respiratory: Negative for shortness of breath.   Cardiovascular: Negative for chest pain, palpitations and leg swelling.  Gastrointestinal: Positive for constipation. Negative for nausea, abdominal pain and diarrhea.  Genitourinary: Negative for dysuria.  Musculoskeletal: Negative for falls.  Skin: Negative for rash.  Neurological: Positive for sensory change and focal weakness. Negative for loss of consciousness and headaches.  Endo/Heme/Allergies: Negative for polydipsia.  Psychiatric/Behavioral: Negative for depression and suicidal ideas. The patient is not nervous/anxious and does not have insomnia.     Objective  BP 130/80  Pulse 69  Temp(Src) 98.1 F (36.7 C) (Oral)  SpO2 97%  Physical Exam  Physical Exam  Constitutional: She is oriented to person, place, and time and well-developed, well-nourished, and in no distress. No distress.  HENT:  Head: Normocephalic and atraumatic.  Eyes: Conjunctivae are normal.  Neck: Neck supple. No thyromegaly present.  Cardiovascular: Normal rate, regular rhythm and normal heart sounds.    No murmur heard. Pulmonary/Chest: Effort normal and breath sounds normal. She has no wheezes.  Abdominal: She exhibits no distension and no mass.  Musculoskeletal: She exhibits no edema.  Quadriplegia, in wheelchair  Lymphadenopathy:    She has no cervical adenopathy.  Neurological: She is alert and oriented to person, place, and time.  Skin: Skin is warm and dry. No rash noted. She is not diaphoretic.  Psychiatric: Memory, affect and judgment normal.    Lab Results  Component Value Date   TSH 2.307 11/22/2012   Lab Results  Component Value Date   WBC 5.6 11/22/2012   HGB 13.9 11/22/2012   HCT 39.5 11/22/2012   MCV 77.6* 11/22/2012   PLT 240 11/22/2012   Lab Results  Component Value Date   CREATININE 0.43* 11/22/2012   BUN 7 11/22/2012   NA 138 11/22/2012   K 4.1 11/22/2012   CL 104 11/22/2012   CO2 27 11/22/2012   Lab Results  Component Value Date   ALT 10 11/22/2012   AST 11 11/22/2012   ALKPHOS 94 11/22/2012   BILITOT 0.3 11/22/2012   Lab Results  Component Value Date   CHOL 210* 11/22/2012   Lab Results  Component Value Date   HDL 40 11/22/2012   Lab Results  Component Value Date   LDLCALC 141* 11/22/2012   Lab Results  Component Value Date   TRIG 147 11/22/2012   Lab Results  Component Value Date   CHOLHDL 5.3 11/22/2012     Assessment & Plan  HYPERTENSION Well controlled no changes  HYPERLIPIDEMIA Mild, avoid trans fats, consider krill oil caps daily  QUADRIPLEGIA Does very well with self care. Is in need of new neurosurgical consult for ongoing care. Will arrange with Dr Franky Macho at patient request  Sacral pressure ulcer No concerns at present, good self care, has an established relationship with dermatology  Pain in joint, shoulder region Recurrent pain and dysfunction in shoulders has limited her ability to exercise.   ANEMIA Resolved with recent labs

## 2012-12-01 ENCOUNTER — Encounter: Payer: Self-pay | Admitting: Family Medicine

## 2012-12-01 DIAGNOSIS — I341 Nonrheumatic mitral (valve) prolapse: Secondary | ICD-10-CM

## 2012-12-01 DIAGNOSIS — M25519 Pain in unspecified shoulder: Secondary | ICD-10-CM

## 2012-12-01 DIAGNOSIS — L89159 Pressure ulcer of sacral region, unspecified stage: Secondary | ICD-10-CM

## 2012-12-01 DIAGNOSIS — I059 Rheumatic mitral valve disease, unspecified: Secondary | ICD-10-CM | POA: Insufficient documentation

## 2012-12-01 DIAGNOSIS — N319 Neuromuscular dysfunction of bladder, unspecified: Secondary | ICD-10-CM | POA: Insufficient documentation

## 2012-12-01 DIAGNOSIS — M25511 Pain in right shoulder: Secondary | ICD-10-CM | POA: Insufficient documentation

## 2012-12-01 HISTORY — DX: Pressure ulcer of sacral region, unspecified stage: L89.159

## 2012-12-01 HISTORY — DX: Pain in unspecified shoulder: M25.519

## 2012-12-01 HISTORY — DX: Nonrheumatic mitral (valve) prolapse: I34.1

## 2012-12-01 NOTE — Assessment & Plan Note (Signed)
Mild, avoid trans fats, consider krill oil caps daily

## 2012-12-01 NOTE — Assessment & Plan Note (Signed)
No concerns at present, good self care, has an established relationship with dermatology

## 2012-12-01 NOTE — Assessment & Plan Note (Signed)
Well controlled no changes 

## 2012-12-01 NOTE — Assessment & Plan Note (Signed)
Resolved with recent labs. 

## 2012-12-01 NOTE — Assessment & Plan Note (Signed)
Does very well with self care. Is in need of new neurosurgical consult for ongoing care. Will arrange with Dr Franky Macho at patient request

## 2012-12-01 NOTE — Assessment & Plan Note (Signed)
Recurrent pain and dysfunction in shoulders has limited her ability to exercise.

## 2012-12-17 ENCOUNTER — Encounter: Payer: Self-pay | Admitting: Family Medicine

## 2012-12-17 ENCOUNTER — Ambulatory Visit (INDEPENDENT_AMBULATORY_CARE_PROVIDER_SITE_OTHER): Payer: Medicare FFS | Admitting: Family Medicine

## 2012-12-17 VITALS — BP 130/78 | HR 75 | Temp 98.6°F

## 2012-12-17 DIAGNOSIS — I10 Essential (primary) hypertension: Secondary | ICD-10-CM

## 2012-12-17 DIAGNOSIS — N939 Abnormal uterine and vaginal bleeding, unspecified: Secondary | ICD-10-CM

## 2012-12-17 DIAGNOSIS — B379 Candidiasis, unspecified: Secondary | ICD-10-CM

## 2012-12-17 DIAGNOSIS — N898 Other specified noninflammatory disorders of vagina: Secondary | ICD-10-CM

## 2012-12-17 DIAGNOSIS — N760 Acute vaginitis: Secondary | ICD-10-CM

## 2012-12-17 DIAGNOSIS — N950 Postmenopausal bleeding: Secondary | ICD-10-CM

## 2012-12-17 HISTORY — DX: Acute vaginitis: N76.0

## 2012-12-17 HISTORY — DX: Postmenopausal bleeding: N95.0

## 2012-12-17 MED ORDER — FLUCONAZOLE 150 MG PO TABS: 150.0000 mg | ORAL_TABLET | ORAL | Status: DC

## 2012-12-17 NOTE — Patient Instructions (Addendum)
Rel of rec Dr Wilson Singer, Urology  Rel of rec Comp physicians, (Rehab across from [facility])    Postmenopausal Bleeding Menopause is commonly referred to as the "change in life." It is a time when the fertile years, the time of ovulating and having menstrual periods, has come to an end. It is also determined by not having menstrual periods for 12 months.  Postmenopausal bleeding is any bleeding a woman has after she has entered into menopause. Any type of postmenopausal bleeding, even if it appears to be a typical menstrual period, is concerning. This should be evaluated by your caregiver.  CAUSES   Hormone therapy.  Cancer of the cervix or cancer of the lining of the uterus (endometrial cancer).  Thinning of the uterine lining (uterine atrophy).  Thyroid diseases.  Certain medicines.  Infection of the uterus or cervix.  Inflammation or irritation of the uterine lining (endometritis).  Estrogen-secreting tumors.  Growths (polyps) on the cervix, uterine lining, or uterus.  Uterine tumors (fibroids).  Being very overweight (obese). DIAGNOSIS  Your caregiver will take a medical history and ask questions. A physical exam will also be performed. Further tests may include:   A transvaginal ultrasound. An ultrasound wand or probe is inserted into your vagina to view the pelvic organs.  A biopsy of the lining of the uterus (endometrium). A sample of the endometrium is removed and examined.  A hysteroscopy. Your caregiver may use an instrument with a light and a camera attached to it (hysteroscope). The hysteroscope is used to look inside the uterus for problems.  A dilation and curettage (D&C). Tissue is removed from the uterine lining to be examined for problems. TREATMENT  Treatment depends on the cause of the bleeding. Some treatments include:   Surgery.  Medicines.  Hormones.  A hysteroscopy or D&C to remove polyps or fibroids.  Changing or stopping a current medicine you  are taking. Talk to your caregiver about your specific treatment. HOME CARE INSTRUCTIONS   Maintain a healthy weight.  Keep regular pelvic exams and Pap tests. SEEK MEDICAL CARE IF:   You have bleeding, even if it is light in comparison to your previous periods.  Your bleeding lasts more than 1 week.  You have abdominal pain.  You develop bleeding with sexual intercourse. SEEK IMMEDIATE MEDICAL CARE IF:   You have a fever, chills, headache, dizziness, muscle aches, and bleeding.  You have severe pain with bleeding.  You are passing blood clots.  You have bleeding and need more than 1 pad an hour.  You feel faint. MAKE SURE YOU:  Understand these instructions.  Will watch your condition.  Will get help right away if you are not doing well or get worse. Document Released: 09/07/2005 Document Revised: 08/22/2011 Document Reviewed: 02/03/2011 [facility] Patient Information 2014 [place], [place].

## 2012-12-17 NOTE — Assessment & Plan Note (Signed)
Well controlled no changes 

## 2012-12-17 NOTE — Assessment & Plan Note (Signed)
Given prescriptions for Terazol cream and Diflucan to use prn.

## 2012-12-17 NOTE — Progress Notes (Signed)
Patient ID: [name], female   DOB: March 19, 1951, 62 y.o.   MRN: [id number] [name] [id number] [birth year] 12/17/2012      Progress Note-Follow Up  Subjective  Chief Complaint  Chief Complaint  Patient presents with  . Vaginal Bleeding    X 4 times since March    HPI  This is a 62 year old Caucasian female who is in today to discuss 4 discrete episodes of vaginal bleeding. She is quadriplegic and has a very complicated daily bowel regimen. She gets small enemas each day and 4 different times since March she's had episodes of vaginal bleeding with) blood dripping. It was not persistently heard throughout the day denies any other acute complaints. Does have an indwelling Foley catheter but has not noted any blood in her urine. Did have some recent vaginitis with discharge but that is improved with recentTerazol treatment  Past Medical History  Diagnosis Date  . Hyperlipidemia   . Hypertension   . 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 Neurogenic bladder   . Bowel dysfunction     constipation and fecal retention  . History of kidney stones   . Obesity   . Sacral pressure ulcer 12/01/2012    H/o Follows with Dr Duane Lope of Dermatology  . Pain in joint, shoulder region 12/01/2012    Long history B/l Follows with Dr Daphine Deutscher at [facility]  . Mitral valve disorder 12/01/2012  . Postmenopausal bleeding 12/17/2012  . Vaginitis and vulvovaginitis 12/17/2012    Past Surgical History  Procedure Laterality Date  . Laminectomy  1981    AVM  . Transfer tendon hand  1982, 1983    to right hand  . Rotator cuff repair  1996, 1998    left shoulder  . Total shoulder replacement  2001    left  . Shoulder arthroscopy  2008    right  . Ventral hernia repair  08/17/2010    Postop ileus (Dr. Andrey Campanile)  . Hernia repair  2012    umbilical hernia repair    Family History  Problem Relation Age of Onset  . Cancer Mother     breast  . Heart disease Father     CAD, MI     History   Social History  . Marital Status: Single    Spouse Name: N/A    Number of Children: N/A  . Years of Education: N/A   Occupational History  . Disabled     math teacher   Social History Main Topics  . Smoking status: Never Smoker   . Smokeless tobacco: Never Used  . Alcohol Use: No  . Drug Use: Not on file  . Sexually Active: Not on file   Other Topics Concern  . Not on file   Social History Narrative   Sister is primary caregiver    Current Outpatient Prescriptions on File Prior to Visit  Medication Sig Dispense Refill  . baclofen (LIORESAL) 20 MG tablet Take 1/2 to 1 tablet by mouth three times a day.  90 each  3  . diazepam (VALIUM) 5 MG tablet Take 1 tablet (5 mg total) by mouth daily as needed for anxiety. 1/2 tablet by mouth at bedtime as needed.  30 tablet  2  . furosemide (LASIX) 20 MG tablet Take 1 tablet (20 mg total) by mouth 2 (two) times daily.  180 tablet  0  . HYDROcodone-acetaminophen (NORCO) 5-325 MG per tablet Take 1 tablet by  mouth every 6 (six) hours as needed for pain.  40 tablet  1  . HYDROcodone-acetaminophen (VICODIN) 5-500 MG per tablet Take by mouth. Take 1-2 tablets daily as needed.      . Multiple Vitamins-Calcium (ONE-A-DAY WOMENS FORMULA PO) Take 1 tablet by mouth daily.        Marland Kitchen oxybutynin (DITROPAN) 5 MG tablet Take 5 mg by mouth every morning.        . potassium chloride SA (K-DUR,KLOR-CON) 20 MEQ tablet Take 2 tablets by mouth two times a day.  360 tablet  2  . pravastatin (PRAVACHOL) 20 MG tablet Take 10 mg by mouth every evening.       No current facility-administered medications on file prior to visit.    Allergies  Allergen Reactions  . Adhesive (Tape) Other (See Comments)    Skin Irritant.    Review of Systems  Review of Systems  Constitutional: Negative for fever and malaise/fatigue.  HENT: Negative for congestion.   Eyes: Negative for pain and discharge.  Respiratory: Negative for shortness of breath.    Cardiovascular: Negative for chest pain, palpitations and leg swelling.  Gastrointestinal: Negative for nausea, abdominal pain and diarrhea.  Genitourinary: Negative for dysuria.  Musculoskeletal: Negative for falls.  Skin: Negative for rash.  Neurological: Negative for loss of consciousness and headaches.  Endo/Heme/Allergies: Negative for polydipsia.  Psychiatric/Behavioral: Negative for depression and suicidal ideas. The patient is not nervous/anxious and does not have insomnia.     Objective  BP 130/78  Pulse 75  Temp(Src) 98.6 F (37 C) (Oral)  SpO2 97%  Physical Exam  Physical Exam  Constitutional: She is oriented to person, place, and time and well-developed, well-nourished, and in no distress. No distress.  HENT:  Head: Normocephalic and atraumatic.  Eyes: Conjunctivae are normal.  Neck: Neck supple. No thyromegaly present.  Cardiovascular: Normal rate, regular rhythm and normal heart sounds.   No murmur heard. Pulmonary/Chest: Effort normal and breath sounds normal. She has no wheezes.  Abdominal: She exhibits no distension and no mass.  Musculoskeletal: She exhibits no edema.  Lymphadenopathy:    She has no cervical adenopathy.  Neurological: She is alert and oriented to person, place, and time.  Skin: Skin is warm and dry. No rash noted. She is not diaphoretic.  Psychiatric: Memory, affect and judgment normal.    Lab Results  Component Value Date   TSH 2.307 11/22/2012   Lab Results  Component Value Date   WBC 5.6 11/22/2012   HGB 13.9 11/22/2012   HCT 39.5 11/22/2012   MCV 77.6* 11/22/2012   PLT 240 11/22/2012   Lab Results  Component Value Date   CREATININE 0.43* 11/22/2012   BUN 7 11/22/2012   NA 138 11/22/2012   K 4.1 11/22/2012   CL 104 11/22/2012   CO2 27 11/22/2012   Lab Results  Component Value Date   ALT 10 11/22/2012   AST 11 11/22/2012   ALKPHOS 94 11/22/2012   BILITOT 0.3 11/22/2012   Lab Results  Component Value Date   CHOL 210* 11/22/2012    Lab Results  Component Value Date   HDL 40 11/22/2012   Lab Results  Component Value Date   LDLCALC 141* 11/22/2012   Lab Results  Component Value Date   TRIG 147 11/22/2012   Lab Results  Component Value Date   CHOLHDL 5.3 11/22/2012     Assessment & Plan  Postmenopausal bleeding 4 discreet episodes of frank red blood from the vagina after  her bowel regimen since march, will need referral to gynecology for further consideration. Is also given an IFob test and a urine culture to rule out other causes of bleeding.  Vaginitis and vulvovaginitis Given prescriptions for Terazol cream and Diflucan to use prn.  HYPERTENSION Well controlled no changes.

## 2012-12-17 NOTE — Assessment & Plan Note (Signed)
4 discreet episodes of frank red blood from the vagina after her bowel regimen since march, will need referral to gynecology for further consideration. Is also given an IFob test and a urine culture to rule out other causes of bleeding.

## 2012-12-18 ENCOUNTER — Telehealth: Payer: Self-pay | Admitting: Family Medicine

## 2012-12-18 ENCOUNTER — Other Ambulatory Visit: Payer: Self-pay | Admitting: *Deleted

## 2012-12-18 DIAGNOSIS — D649 Anemia, unspecified: Secondary | ICD-10-CM

## 2012-12-18 LAB — URINALYSIS
Bilirubin Urine: NEGATIVE
Glucose, UA: NEGATIVE mg/dL
Hgb urine dipstick: NEGATIVE
Ketones, ur: NEGATIVE mg/dL
Leukocytes, UA: NEGATIVE
Nitrite: NEGATIVE
Protein, ur: NEGATIVE mg/dL
Specific Gravity, Urine: 1.008 (ref 1.005–1.030)
Urobilinogen, UA: 0.2 mg/dL (ref 0.0–1.0)
pH: 7 (ref 5.0–8.0)

## 2012-12-18 NOTE — Progress Notes (Signed)
IFOB order entered; given to patient 07.07.14/[facility]S

## 2012-12-18 NOTE — Telephone Encounter (Signed)
Received medical records from [facility]-Dr. Claretta Fraise: [phone] F: [phone]

## 2012-12-19 ENCOUNTER — Other Ambulatory Visit: Payer: Medicare FFS

## 2012-12-19 DIAGNOSIS — D649 Anemia, unspecified: Secondary | ICD-10-CM

## 2012-12-19 LAB — FECAL OCCULT BLOOD, IMMUNOCHEMICAL: Fecal Occult Bld: NEGATIVE

## 2012-12-20 LAB — URINE CULTURE: Colony Count: >=100000

## 2012-12-24 MED ORDER — SULFAMETHOXAZOLE-TMP DS 800-160 MG PO TABS: 1.0000 | ORAL_TABLET | Freq: Two times a day (BID) | ORAL | Status: DC

## 2012-12-24 NOTE — Addendum Note (Signed)
Addended by: Court Joy on: 12/24/2012 05:34 PM   Modules accepted: Orders

## 2012-12-25 NOTE — Progress Notes (Signed)
Quick Note:  Patient Informed and voiced understanding ______ 

## 2013-01-09 ENCOUNTER — Encounter: Payer: Self-pay | Admitting: Family Medicine

## 2013-01-09 NOTE — Telephone Encounter (Signed)
Please advise 

## 2013-01-09 NOTE — Telephone Encounter (Signed)
Please see other message.

## 2013-01-10 NOTE — Telephone Encounter (Signed)
I informed pt that letter was done and she asked that I give the letter to her sister Okey Regal today during her appt

## 2013-01-25 ENCOUNTER — Encounter: Payer: Self-pay | Admitting: Family Medicine

## 2013-02-14 ENCOUNTER — Encounter: Payer: Medicare FFS | Admitting: Obstetrics & Gynecology

## 2013-02-14 ENCOUNTER — Other Ambulatory Visit: Payer: Self-pay | Admitting: *Deleted

## 2013-02-14 DIAGNOSIS — N950 Postmenopausal bleeding: Secondary | ICD-10-CM

## 2013-02-21 ENCOUNTER — Other Ambulatory Visit: Payer: Self-pay | Admitting: Obstetrics & Gynecology

## 2013-02-21 ENCOUNTER — Ambulatory Visit (HOSPITAL_COMMUNITY)
Admission: RE | Admit: 2013-02-21 | Discharge: 2013-02-21 | Disposition: A | Discharge status: Home / Self Care | Payer: Medicare PPO | Source: Ambulatory Visit | Attending: Obstetrics & Gynecology | Admitting: Obstetrics & Gynecology

## 2013-02-21 DIAGNOSIS — N950 Postmenopausal bleeding: Secondary | ICD-10-CM | POA: Insufficient documentation

## 2013-02-21 DIAGNOSIS — N854 Malposition of uterus: Secondary | ICD-10-CM | POA: Insufficient documentation

## 2013-02-22 ENCOUNTER — Encounter: Payer: Self-pay | Admitting: *Deleted

## 2013-02-25 ENCOUNTER — Ambulatory Visit (INDEPENDENT_AMBULATORY_CARE_PROVIDER_SITE_OTHER): Payer: Medicare PPO | Admitting: Obstetrics & Gynecology

## 2013-02-25 ENCOUNTER — Other Ambulatory Visit (HOSPITAL_COMMUNITY)
Admission: RE | Admit: 2013-02-25 | Discharge: 2013-02-25 | Disposition: A | Discharge status: Home / Self Care | Payer: Medicare PPO | Source: Ambulatory Visit | Attending: Obstetrics & Gynecology | Admitting: Obstetrics & Gynecology

## 2013-02-25 ENCOUNTER — Encounter: Payer: Self-pay | Admitting: Obstetrics & Gynecology

## 2013-02-25 VITALS — BP 156/80 | HR 58 | Temp 97.8°F | Ht 71.5 in

## 2013-02-25 DIAGNOSIS — N950 Postmenopausal bleeding: Secondary | ICD-10-CM

## 2013-02-25 NOTE — Patient Instructions (Signed)
 Endometrial Biopsy This is a test in which a tissue sample (a biopsy) is taken from inside the uterus (womb). It is then looked at by a specialist under a microscope to see if the tissue is normal or abnormal. The endometrium is the lining of the uterus. This test helps determine where you are in your menstrual cycle and how hormone levels are affecting the lining of the uterus. Another use for this test is to diagnose endometrial cancer, tuberculosis, polyps, or inflammatory conditions and to evaluate uterine bleeding. PREPARATION FOR TEST No preparation or fasting is necessary. NORMAL FINDINGS No pathologic conditions. Presence of "secretory-type" endometrium 3 to 5 days before to normal menstruation. Ranges for normal findings may vary among different laboratories and hospitals. You should always check with your doctor after having lab work or other tests done to discuss the meaning of your test results and whether your values are considered within normal limits. MEANING OF TEST  Your caregiver will go over the test results with you and discuss the importance and meaning of your results, as well as treatment options and the need for additional tests if necessary. OBTAINING THE TEST RESULTS It is your responsibility to obtain your test results. Ask the lab or department performing the test when and how you will get your results. Document Released: 09/30/2004 Document Revised: 08/22/2011 Document Reviewed: 05/09/2008 ExitCare Patient Information 2014 [employer]. Postmenopausal Bleeding Menopause is commonly referred to as the "change in life." It is a time when the fertile years, the time of ovulating and having menstrual periods, has come to an end. It is also determined by not having menstrual periods for 12 months.  Postmenopausal bleeding is any bleeding a woman has after she has entered into menopause. Any type of postmenopausal bleeding, even if it appears to be a typical menstrual  period, is concerning. This should be evaluated by your caregiver.  CAUSES   Hormone therapy.  Cancer of the cervix or cancer of the lining of the uterus (endometrial cancer).  Thinning of the uterine lining (uterine atrophy).  Thyroid diseases.  Certain medicines.  Infection of the uterus or cervix.  Inflammation or irritation of the uterine lining (endometritis).  Estrogen-secreting tumors.  Growths (polyps) on the cervix, uterine lining, or uterus.  Uterine tumors (fibroids).  Being very overweight (obese). DIAGNOSIS  Your caregiver will take a medical history and ask questions. A physical exam will also be performed. Further tests may include:   A transvaginal ultrasound. An ultrasound wand or probe is inserted into your vagina to view the pelvic organs.  A biopsy of the lining of the uterus (endometrium). A sample of the endometrium is removed and examined.  A hysteroscopy. Your caregiver may use an instrument with a light and a camera attached to it (hysteroscope). The hysteroscope is used to look inside the uterus for problems.  A dilation and curettage (D&C). Tissue is removed from the uterine lining to be examined for problems. TREATMENT  Treatment depends on the cause of the bleeding. Some treatments include:   Surgery.  Medicines.  Hormones.  A hysteroscopy or D&C to remove polyps or fibroids.  Changing or stopping a current medicine you are taking. Talk to your caregiver about your specific treatment. HOME CARE INSTRUCTIONS   Maintain a healthy weight.  Keep regular pelvic exams and Pap tests. SEEK MEDICAL CARE IF:   You have bleeding, even if it is light in comparison to your previous periods.  Your bleeding lasts more than 1   week.  You have abdominal pain.  You develop bleeding with sexual intercourse. SEEK IMMEDIATE MEDICAL CARE IF:   You have a fever, chills, headache, dizziness, muscle aches, and bleeding.  You have severe pain with  bleeding.  You are passing blood clots.  You have bleeding and need more than 1 pad an hour.  You feel faint. MAKE SURE YOU:  Understand these instructions.  Will watch your condition.  Will get help right away if you are not doing well or get worse. Document Released: 09/07/2005 Document Revised: 08/22/2011 Document Reviewed: 02/03/2011 ExitCare Patient Information 2014 [employer].  

## 2013-02-25 NOTE — Progress Notes (Signed)
GYNECOLOGY CLINIC ENCOUNTER NOTE  History:  62 y.o. G0 postmenopausal woman here today for evaluation of postmenopausal bleeding (PMB).  She noticed this on four occasions after a bowel movement; she does have hemorrhoids and does not think it is from her hemorrhoids.  She reports that she had a fecal occult blood test that was negative; also one of her caregivers noticed a clot near her vagina after one of the episodes.  Patient is a paraplegic, not sexually active. No other GYN concerns. Patient has a female friend who accompanied her today.  The following portions of the patient's history were reviewed and updated as appropriate: allergies, current medications, past family history, past medical history, past social history, past surgical history and problem list. Normal pap smear on 03/21/2013 and normal mammogram on 01/21/13 ; these studies were done at outside health institutions.  Review of Systems:  Pertinent items are noted in HPI.  Objective:  Physical Exam BP 156/80  Pulse 58  Temp(Src) 97.8 F (36.6 C) (Oral)  Ht 5' 11.5" (1.816 m) Gen: NAD Abd: Soft, nontender and nondistended Pelvic: Normal appearing external genitalia; atrophic mucosa and cervix.  Scant clear discharge, no blood.  Urethral foley catheter in place.  ENDOMETRIAL BIOPSY     The indications for endometrial biopsy were reviewed.   Risks of the biopsy including cramping, bleeding, infection, uterine perforation, inadequate specimen and need for additional procedures  were discussed. The patient states she understands and agrees to undergo procedure today. Consent was signed. Time out was performed. Urine HCG was negative. During the pelvic exam, the cervix was prepped with Betadine. A single-toothed tenaculum was placed on the anterior lip of the cervix to stabilize it. The 3 mm pipelle was introduced into the endometrial cavity without difficulty to a depth of 5.5 cm, and a small amount of tissue was obtained and sent  to pathology. The instruments were removed from the patient's vagina. Minimal bleeding from the cervix was noted. The patient tolerated the procedure well. Routine post-procedure instructions were given to the patient.    Labs and Imaging 02/21/2013  TRANSABDOMINAL AND TRANSVAGINAL ULTRASOUND OF PELVIS CLINICAL DATA:  Postmenopausal bleeding. TECHNIQUE: Both transabdominal and transvaginal ultrasound examinations of the pelvis were performed. Transabdominal technique was performed for global imaging of the pelvis including uterus, ovaries, adnexal regions, and pelvic cul-de-sac. It was necessary to proceed with endovaginal exam following the transabdominal exam to visualize the endometrium and ovaries.  COMPARISON:  None  FINDINGS: Uterus  Measurements: 5.6 x 2.6 x 4.1 cm. Retroverted. No fibroids or other mass visualized.  Endometrium  Thickness: 3 mm  No focal abnormality visualized.  Right ovary  Measurements: 2.6 x 1.2 x 1.2 cm. Normal appearance/no adnexal mass.  Left ovary  Measurements: 2.3 x 1.1 x 1.7 cm. Normal appearance/no adnexal mass.  Other findings  No free fluid.  IMPRESSION: No pelvic mass or other significant abnormality identified.  Endometrial thickness measures 3 mm. In the setting of post-menopausal bleeding, this is consistent with a benign etiology such as endometrial atrophy. If bleeding remains unresponsive to hormonal or medical therapy, sonohysterogram should be considered for focal lesion work-up. (Ref: Radiological Reasoning: Algorithmic Workup of Abnormal Vaginal Bleeding with Endovaginal Sonography and Sonohysterography. AJR 2008; 409:W11-91)   Electronically Signed   By: Myles Rosenthal   On: 02/21/2013 14:14   Assessment & Plan:  Patient had abnormal bleeding noted after bowel movements; concerning for possible PMB.  Could also be of GI etiology.  Ultrasound shows atrophic endometrium  which is reassuring, results discussed with patient and her friend.  Biopsy will likely show  atrophic endometrium.  If bleeding continues, she may benefit from GI evaluation.  Bleeding precautions reviewed.

## 2013-02-26 ENCOUNTER — Encounter: Payer: Self-pay | Admitting: Obstetrics & Gynecology

## 2013-03-20 ENCOUNTER — Ambulatory Visit (INDEPENDENT_AMBULATORY_CARE_PROVIDER_SITE_OTHER): Payer: Medicare PPO

## 2013-03-20 DIAGNOSIS — Z23 Encounter for immunization: Secondary | ICD-10-CM

## 2013-04-09 ENCOUNTER — Telehealth (INDEPENDENT_AMBULATORY_CARE_PROVIDER_SITE_OTHER): Payer: Self-pay

## 2013-04-09 NOTE — Telephone Encounter (Signed)
Dr. Matthias Hughs / Velna Hatchet called  Asking if Dr. Maisie Fus could see patient for polyps or hems . Patient is quadplegic she will bring hoyer lift and 2 personal care asst . Appt made for 04/17/13@2p  patient aware . FYI Patient does see DR. Annabell Howells

## 2013-04-10 ENCOUNTER — Encounter: Payer: Self-pay | Admitting: *Deleted

## 2013-04-17 ENCOUNTER — Encounter (INDEPENDENT_AMBULATORY_CARE_PROVIDER_SITE_OTHER): Payer: Self-pay | Admitting: General Surgery

## 2013-04-17 ENCOUNTER — Ambulatory Visit (INDEPENDENT_AMBULATORY_CARE_PROVIDER_SITE_OTHER): Payer: Medicare PPO | Admitting: General Surgery

## 2013-04-17 VITALS — BP 138/80 | HR 64 | Temp 98.3°F | Resp 14

## 2013-04-17 DIAGNOSIS — K620 Anal polyp: Secondary | ICD-10-CM

## 2013-04-17 DIAGNOSIS — K644 Residual hemorrhoidal skin tags: Secondary | ICD-10-CM

## 2013-04-17 NOTE — Progress Notes (Signed)
Chief Complaint  Patient presents with  . New Evaluation    eval hems / polyps    HISTORY: [name] is a 62 y.o. female who presents to the office with an anal mass.  This was found by Dr Matthias Hughs on his most recent exam.  Pt denies any pain or bleeding.  She has no FH of colorectal or anal cancers.   She is quadriplegic and uses a bowel stimulatory program to defecate each morning.  Her sister performs this daily and has noticed it for several months.    Past Medical History  Diagnosis Date  . Hyperlipidemia   . Hypertension   . 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 Neurogenic bladder   . Bowel dysfunction     constipation and fecal retention  . History of kidney stones   . Obesity   . Sacral pressure ulcer 12/01/2012    H/o Follows with Dr Duane Lope of Dermatology  . Pain in joint, shoulder region 12/01/2012    Long history B/l Follows with [facility] Daphine Deutscher [facility]  . Mitral valve disorder 12/01/2012  . Postmenopausal bleeding 12/17/2012  . Vaginitis and vulvovaginitis 12/17/2012  . Arthritis       Past Surgical History  Procedure Laterality Date  . Laminectomy  1981    AVM  . Transfer tendon hand  1982, 1983    to right hand  . Rotator cuff repair  1996, 1998    left shoulder  . Total shoulder replacement  2001    left  . Shoulder arthroscopy  2008    right  . Ventral hernia repair  08/17/2010    Postop ileus (Dr. Andrey Campanile)  . Hernia repair  2012    umbilical hernia repair      Current Outpatient Prescriptions  Medication Sig Dispense Refill  . baclofen (LIORESAL) 20 MG tablet Take 1/2 to 1 tablet by mouth three times a day.  90 each  3  . clobetasol (TEMOVATE) 0.05 % external solution       . diazepam (VALIUM) 5 MG tablet Take 1 tablet (5 mg total) by mouth daily as needed for anxiety. 1/2 tablet by mouth at bedtime as needed.  30 tablet  2  . fluconazole (DIFLUCAN) 150 MG tablet Take 1 tablet (150 mg total) by mouth once a week.  2  tablet  1  . furosemide (LASIX) 20 MG tablet Take 1 tablet (20 mg total) by mouth 2 (two) times daily.  180 tablet  0  . HYDROcodone-acetaminophen (NORCO) 5-325 MG per tablet Take 1 tablet by mouth every 6 (six) hours as needed for pain.  40 tablet  1  . METRONIDAZOLE, TOPICAL, 0.75 % LOTN       . Multiple Vitamins-Calcium (ONE-A-DAY WOMENS FORMULA PO) Take 1 tablet by mouth daily.        Marland Kitchen oxybutynin (DITROPAN) 5 MG tablet Take 5 mg by mouth every morning.        . potassium chloride SA (K-DUR,KLOR-CON) 20 MEQ tablet Take 2 tablets by mouth two times a day.  360 tablet  2   No current facility-administered medications for this visit.      Allergies  Allergen Reactions  . Adhesive [Tape] Other (See Comments)    Skin Irritant.      Family History  Problem Relation Age of Onset  . Cancer Mother     breast & melanoma  . Heart disease Father  CAD, MI  . Cancer Sister     melanoma  . Cancer Maternal Aunt     breast  . Cancer Paternal Aunt     breast  . Cancer Paternal Uncle     breast  . Cancer Paternal Aunt     breast      History   Social History  . Marital Status: Single    Spouse Name: N/A    Number of Children: N/A  . Years of Education: N/A   Occupational History  . Disabled     math teacher   Social History Main Topics  . Smoking status: Never Smoker   . Smokeless tobacco: Never Used  . Alcohol Use: No  . Drug Use: None  . Sexual Activity: None   Other Topics Concern  . None   Social History Narrative   Sister is primary caregiver       REVIEW OF SYSTEMS - PERTINENT POSITIVES ONLY: Review of Systems - General ROS: negative for - chills or fever Respiratory ROS: no cough, shortness of breath, or wheezing Cardiovascular ROS: no chest pain or dyspnea on exertion Gastrointestinal ROS: no abdominal pain, change in bowel habits, or black or bloody stools Genito-Urinary ROS: no dysuria, trouble voiding, or hematuria  EXAM: Filed Vitals:   04/17/13  1407  BP: 138/80  Pulse: 64  Temp: 98.3 F (36.8 C)  Resp: 14     Gen:  No acute distress.  Well nourished and well groomed.   Neurological: Alert and oriented to person, place, and time.  Head: Normocephalic and atraumatic.  Eyes: Conjunctivae are normal. Pupils are equal, round, and reactive to light. No scleral icterus.  Neck: Normal range of motion.  Cardiovascular: Normal rate, regular rhythm, normal heart sounds and intact distal pulses.  Respiratory: Effort normal.  No respiratory distress.  Breath sounds normal.  GI: Soft. Bowel sounds are normal. The abdomen is soft and nontender.  There is no rebound and no guarding.  Psychiatric: Normal mood and affect. Behavior is normal. Judgment and thought content normal.   Procedure: Anoscopy Surgeon: Maisie Fus Assistant: McPhearson After the risks and benefits were explained, verbal consent was obtained for above procedure  Anesthesia: local Diagnosis: anal mass Findings: posterior anal skin tag, moderate external hemorrhoids, minimal internal hemorrhoid disease. Area infused with lidocaine subcutaneously. Skin tag removed with scissors. Hemostasis achieved using silver nitrate. Patient tolerated procedure well.       ASSESSMENT AND PLAN:  [name] is a 62 y.o. F with an anal nodule that was noted during digital rectal stimulation for bowel movements.   On exam she had a small but long posterior anal skin tag that appeared completely benign.  This was most likely due to a previous anal fissure. This was removed during my exam.  She may followup with me as needed. Instructions were given regarding bowel function. They will call the office if she develops any significant bleeding with bowel movements.   Vanita Panda, MD Colon and Rectal Surgery / General Surgery [facility]      Visit Diagnoses: 1. Anal skin tag     Primary Care Physician: Danise Edge, MD

## 2013-04-17 NOTE — Patient Instructions (Signed)
Continue bowel prep starting Friday.  Call the office if you experience significant bleeding or pain.

## 2013-04-24 ENCOUNTER — Telehealth: Payer: Self-pay | Admitting: Family Medicine

## 2013-04-24 NOTE — Telephone Encounter (Signed)
Refill- furosemide 20mg  tab. Take one tablet by mouth twice daily. Qty 180 last fill 8.13.14

## 2013-04-25 MED ORDER — FUROSEMIDE 20 MG PO TABS: 20.0000 mg | ORAL_TABLET | Freq: Two times a day (BID) | ORAL | Status: DC

## 2013-04-25 NOTE — Telephone Encounter (Signed)
Rx request to pharmacy/SLS  

## 2013-05-27 ENCOUNTER — Ambulatory Visit: Payer: Medicare FFS | Admitting: Family Medicine

## 2013-06-17 ENCOUNTER — Telehealth: Payer: Self-pay | Admitting: *Deleted

## 2013-06-17 DIAGNOSIS — Z79899 Other long term (current) drug therapy: Secondary | ICD-10-CM

## 2013-06-17 DIAGNOSIS — G8250 Quadriplegia, unspecified: Secondary | ICD-10-CM

## 2013-06-17 DIAGNOSIS — E785 Hyperlipidemia, unspecified: Secondary | ICD-10-CM

## 2013-06-17 DIAGNOSIS — I10 Essential (primary) hypertension: Secondary | ICD-10-CM

## 2013-06-17 LAB — HEPATIC FUNCTION PANEL
ALT: 29 U/L (ref 0–35)
AST: 19 U/L (ref 0–37)
Albumin: 4.1 g/dL (ref 3.5–5.2)
Alkaline Phosphatase: 81 U/L (ref 39–117)
Bilirubin, Direct: 0.1 mg/dL (ref 0.0–0.3)
Indirect Bilirubin: 0.3 mg/dL (ref 0.0–0.9)
Total Bilirubin: 0.4 mg/dL (ref 0.3–1.2)
Total Protein: 7 g/dL (ref 6.0–8.3)

## 2013-06-17 LAB — CBC WITH DIFFERENTIAL/PLATELET
Basophils Absolute: 0.1 10*3/uL (ref 0.0–0.1)
Basophils Relative: 1 % (ref 0–1)
Eosinophils Absolute: 0.1 10*3/uL (ref 0.0–0.7)
Eosinophils Relative: 2 % (ref 0–5)
HCT: 40.9 % (ref 36.0–46.0)
Hemoglobin: 13.8 g/dL (ref 12.0–15.0)
Lymphocytes Relative: 28 % (ref 12–46)
Lymphs Abs: 1.9 10*3/uL (ref 0.7–4.0)
MCH: 27.4 pg (ref 26.0–34.0)
MCHC: 33.7 g/dL (ref 30.0–36.0)
MCV: 81.2 fL (ref 78.0–100.0)
Monocytes Absolute: 0.4 10*3/uL (ref 0.1–1.0)
Monocytes Relative: 7 % (ref 3–12)
Neutro Abs: 4.2 10*3/uL (ref 1.7–7.7)
Neutrophils Relative %: 62 % (ref 43–77)
Platelets: 228 10*3/uL (ref 150–400)
RBC: 5.04 MIL/uL (ref 3.87–5.11)
RDW: 14.9 % (ref 11.5–15.5)
WBC: 6.8 10*3/uL (ref 4.0–10.5)

## 2013-06-17 LAB — BASIC METABOLIC PANEL
BUN: 6 mg/dL (ref 6–23)
CO2: 29 mEq/L (ref 19–32)
Calcium: 9.2 mg/dL (ref 8.4–10.5)
Chloride: 102 mEq/L (ref 96–112)
Creat: 0.43 mg/dL — ABNORMAL LOW (ref 0.50–1.10)
Glucose, Bld: 92 mg/dL (ref 70–99)
Potassium: 4.2 mEq/L (ref 3.5–5.3)
Sodium: 137 mEq/L (ref 135–145)

## 2013-06-17 LAB — LIPID PANEL
Cholesterol: 237 mg/dL — ABNORMAL HIGH (ref 0–200)
HDL: 45 mg/dL (ref >39)
LDL Cholesterol: 168 mg/dL — ABNORMAL HIGH (ref 0–99)
Total CHOL/HDL Ratio: 5.3 Ratio
Triglycerides: 119 mg/dL (ref <150)
VLDL: 24 mg/dL (ref 0–40)

## 2013-06-17 NOTE — Telephone Encounter (Signed)
Patient presented to [facility] lab; orders placed from prior draws/SLS

## 2013-06-18 LAB — TSH: TSH: 2.463 u[IU]/mL (ref 0.350–4.500)

## 2013-06-24 ENCOUNTER — Ambulatory Visit (INDEPENDENT_AMBULATORY_CARE_PROVIDER_SITE_OTHER): Payer: Medicare PPO | Admitting: Family Medicine

## 2013-06-24 ENCOUNTER — Encounter: Payer: Self-pay | Admitting: Family Medicine

## 2013-06-24 VITALS — BP 160/82 | HR 98 | Temp 98.3°F | Ht 71.5 in

## 2013-06-24 DIAGNOSIS — N319 Neuromuscular dysfunction of bladder, unspecified: Secondary | ICD-10-CM

## 2013-06-24 DIAGNOSIS — D649 Anemia, unspecified: Secondary | ICD-10-CM

## 2013-06-24 DIAGNOSIS — H109 Unspecified conjunctivitis: Secondary | ICD-10-CM

## 2013-06-24 DIAGNOSIS — E785 Hyperlipidemia, unspecified: Secondary | ICD-10-CM

## 2013-06-24 DIAGNOSIS — H6123 Impacted cerumen, bilateral: Secondary | ICD-10-CM

## 2013-06-24 DIAGNOSIS — Z23 Encounter for immunization: Secondary | ICD-10-CM

## 2013-06-24 DIAGNOSIS — H6120 Impacted cerumen, unspecified ear: Secondary | ICD-10-CM

## 2013-06-24 DIAGNOSIS — G8250 Quadriplegia, unspecified: Secondary | ICD-10-CM

## 2013-06-24 DIAGNOSIS — Z0000 Encounter for general adult medical examination without abnormal findings: Secondary | ICD-10-CM

## 2013-06-24 DIAGNOSIS — I10 Essential (primary) hypertension: Secondary | ICD-10-CM

## 2013-06-24 MED ORDER — PNEUMOCOCCAL 13-VAL CONJ VACC IM SUSP: 0.5000 mL | Freq: Once | INTRAMUSCULAR | Status: DC

## 2013-06-24 MED ORDER — BACLOFEN 20 MG PO TABS: ORAL_TABLET | ORAL | Status: DC

## 2013-06-24 MED ORDER — POLYMYXIN B-TRIMETHOPRIM 10000-0.1 UNIT/ML-% OP SOLN: 1.0000 [drp] | Freq: Four times a day (QID) | OPHTHALMIC | Status: DC

## 2013-06-24 MED ORDER — NEOMYCIN-POLYMYXIN-HC 3.5-10000-1 OT SOLN: 3.0000 [drp] | Freq: Every evening | OTIC | Status: DC | PRN

## 2013-06-24 NOTE — Progress Notes (Signed)
Patient ID: [name], female   DOB: 08-Sep-1950, 63 y.o.   MRN: [id number] [name] [id number] 11-04-1950 06/24/2013      Progress Note-Follow Up  Subjective  Chief Complaint  Chief Complaint  Patient presents with  . Follow-up    6 month  . Injections    prevnar    HPI  Patient is a 63 yo female, a quadraplegic in today with her sister. She is here for routine followup. She feels well. She's not had any trouble with constipation her bowel regimen is working well. Urinary cath  well. No fevers or chills. No pain. No shortness of breath or palpitations noted at this time. Taking medications as prescribed  Past Medical History  Diagnosis Date  . Hyperlipidemia   . Hypertension   . 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 Neurogenic bladder   . Bowel dysfunction     constipation and fecal retention  . History of kidney stones   . Obesity   . Sacral pressure ulcer 12/01/2012    H/o Follows with Dr Tomi Likens of Dermatology  . Pain in joint, shoulder region 12/01/2012    Long history B/l [facility] Hassell [facility]  . Mitral valve disorder 12/01/2012  . Postmenopausal bleeding 12/17/2012  . Vaginitis and vulvovaginitis 12/17/2012  . Arthritis     Past Surgical History  Procedure Laterality Date  . Laminectomy  1981    AVM  . Transfer tendon hand  1982, 1983    to right hand  . Rotator cuff repair  1996, 1998    left shoulder  . Total shoulder replacement  2001    left  . Shoulder arthroscopy  2008    right  . Ventral hernia repair  08/17/2010    Postop ileus (Dr. Redmond Pulling)  . Hernia repair  3220    umbilical hernia repair    Family History  Problem Relation Age of Onset  . Cancer Mother     breast & melanoma  . Heart disease Father     CAD, MI  . Cancer Sister     melanoma  . Cancer Maternal Aunt     breast  . Cancer Paternal Aunt     breast  . Cancer Paternal Uncle     breast  . Cancer Paternal Aunt     breast     History   Social History  . Marital Status: Single    Spouse Name: N/A    Number of Children: N/A  . Years of Education: N/A   Occupational History  . Disabled     math teacher   Social History Main Topics  . Smoking status: Never Smoker   . Smokeless tobacco: Never Used  . Alcohol Use: No  . Drug Use: Not on file  . Sexual Activity: Not on file   Other Topics Concern  . Not on file   Social History Narrative   Sister is primary caregiver    Current Outpatient Prescriptions on File Prior to Visit  Medication Sig Dispense Refill  . clobetasol (TEMOVATE) 0.05 % external solution       . diazepam (VALIUM) 5 MG tablet Take 1 tablet (5 mg total) by mouth daily as needed for anxiety. 1/2 tablet by mouth at bedtime as needed.  30 tablet  2  . furosemide (LASIX) 20 MG tablet Take 1 tablet (20 mg total) by mouth 2 (two) times daily.  [place]  tablet  0  . HYDROcodone-acetaminophen (NORCO) 5-325 MG per tablet Take 1 tablet by mouth every 6 (six) hours as needed for pain.  40 tablet  1  . METRONIDAZOLE, TOPICAL, 0.75 % LOTN       . Multiple Vitamins-Calcium (ONE-A-DAY WOMENS FORMULA PO) Take 1 tablet by mouth daily.        Marland Kitchen oxybutynin (DITROPAN) 5 MG tablet Take 5 mg by mouth every morning.        . potassium chloride SA (K-DUR,KLOR-CON) 20 MEQ tablet Take 2 tablets by mouth two times a day.  360 tablet  2   No current facility-administered medications on file prior to visit.    Allergies  Allergen Reactions  . Adhesive [Tape] Other (See Comments)    Skin Irritant.    Review of Systems  ROS   Objective  BP 160/82  Pulse 98  Temp(Src) 98.3 F (36.8 C) (Oral)  Ht 5' 11.5" (1.816 m)  SpO2 99%  Physical Exam  Physical Exam   Lab Results  Component Value Date   TSH 2.463 06/17/2013   Lab Results  Component Value Date   WBC 6.8 06/17/2013   HGB 13.8 06/17/2013   HCT 40.9 06/17/2013   MCV 81.2 06/17/2013   PLT 228 06/17/2013   Lab Results  Component Value Date    CREATININE 0.43* 06/17/2013   BUN 6 06/17/2013   NA 137 06/17/2013   K 4.2 06/17/2013   CL 102 06/17/2013   CO2 29 06/17/2013   Lab Results  Component Value Date   ALT 29 06/17/2013   AST 19 06/17/2013   ALKPHOS 81 06/17/2013   BILITOT 0.4 06/17/2013   Lab Results  Component Value Date   CHOL 237* 06/17/2013   Lab Results  Component Value Date   HDL 45 06/17/2013   Lab Results  Component Value Date   LDLCALC 168* 06/17/2013   Lab Results  Component Value Date   TRIG 119 06/17/2013   Lab Results  Component Value Date   CHOLHDL 5.3 06/17/2013     Assessment & Plan  HYPERTENSION Mild elevation but has been running well. Encouraged DASH diet and modest weight loss  QUADRIPLEGIA Bowel regimen is going well, no new concerns today  ANEMIA resolved  HYPERLIPIDEMIA Mild, avoid trans fats, add krill oil caps daily.   Preventative health care prevnar given today

## 2013-06-24 NOTE — Patient Instructions (Signed)
Cerumen Impaction A cerumen impaction is when the wax in your ear forms a plug. This plug usually causes reduced hearing. Sometimes it also causes an earache or dizziness. Removing a cerumen impaction can be difficult and painful. The wax sticks to the ear canal. The canal is sensitive and bleeds easily. If you try to remove a heavy wax buildup with a cotton tipped swab, you may push it in further. Irrigation with water, suction, and small ear curettes may be used to clear out the wax. If the impaction is fixed to the skin in the ear canal, ear drops may be needed for a few days to loosen the wax. People who build up a lot of wax frequently can use ear wax removal products available in your local drugstore. SEEK MEDICAL CARE IF:  You develop an earache, increased hearing loss, or marked dizziness. Document Released: 07/07/2004 Document Revised: 08/22/2011 Document Reviewed: 08/27/2009 ExitCare Patient Information 2014 [employer].  

## 2013-06-24 NOTE — Progress Notes (Signed)
Pre visit review using our clinic review tool, if applicable. No additional management support is needed unless otherwise documented below in the visit note. 

## 2013-06-26 ENCOUNTER — Encounter: Payer: Self-pay | Admitting: Family Medicine

## 2013-06-26 DIAGNOSIS — Z0000 Encounter for general adult medical examination without abnormal findings: Secondary | ICD-10-CM | POA: Insufficient documentation

## 2013-06-26 HISTORY — DX: Encounter for general adult medical examination without abnormal findings: Z00.00

## 2013-06-26 NOTE — Assessment & Plan Note (Signed)
Mild, avoid trans fats, add krill oil caps daily.

## 2013-06-26 NOTE — Assessment & Plan Note (Signed)
resolved 

## 2013-06-26 NOTE — Assessment & Plan Note (Signed)
Bowel regimen is going well, no new concerns today

## 2013-06-26 NOTE — Assessment & Plan Note (Signed)
prevnar given today 

## 2013-06-26 NOTE — Assessment & Plan Note (Signed)
Mild elevation but has been running well. Encouraged DASH diet and modest weight loss

## 2013-07-24 ENCOUNTER — Telehealth: Payer: Self-pay | Admitting: Family Medicine

## 2013-07-24 MED ORDER — FUROSEMIDE 20 MG PO TABS: 20.0000 mg | ORAL_TABLET | Freq: Two times a day (BID) | ORAL | Status: DC

## 2013-07-24 NOTE — Telephone Encounter (Signed)
Refill-furosemide

## 2013-08-02 ENCOUNTER — Other Ambulatory Visit (HOSPITAL_COMMUNITY): Payer: Self-pay | Admitting: Neurosurgery

## 2013-08-02 DIAGNOSIS — Q288 Other specified congenital malformations of circulatory system: Secondary | ICD-10-CM

## 2013-08-09 ENCOUNTER — Ambulatory Visit: Payer: Medicare PPO | Admitting: Physician Assistant

## 2013-08-20 ENCOUNTER — Ambulatory Visit (HOSPITAL_COMMUNITY)
Admission: RE | Admit: 2013-08-20 | Discharge: 2013-08-20 | Disposition: A | Discharge status: Home / Self Care | Payer: Medicare PPO | Source: Ambulatory Visit | Attending: Neurosurgery | Admitting: Neurosurgery

## 2013-08-20 DIAGNOSIS — Q288 Other specified congenital malformations of circulatory system: Secondary | ICD-10-CM

## 2013-08-20 DIAGNOSIS — G9589 Other specified diseases of spinal cord: Secondary | ICD-10-CM | POA: Insufficient documentation

## 2013-08-20 DIAGNOSIS — M5030 Other cervical disc degeneration, unspecified cervical region: Secondary | ICD-10-CM | POA: Insufficient documentation

## 2013-08-20 DIAGNOSIS — Q283 Other malformations of cerebral vessels: Secondary | ICD-10-CM | POA: Insufficient documentation

## 2013-08-20 LAB — CREATININE, SERUM
Creatinine, Ser: 0.35 mg/dL — ABNORMAL LOW (ref 0.50–1.10)
GFR calc Af Amer: >90 mL/min (ref >90)
GFR calc non Af Amer: >90 mL/min (ref >90)

## 2013-08-20 MED ORDER — GADOBENATE DIMEGLUMINE 529 MG/ML IV SOLN
20.0000 mL | Freq: Once | INTRAVENOUS | Status: AC
  Administered 2013-08-20: 20 mL via INTRAVENOUS

## 2013-08-23 ENCOUNTER — Telehealth: Payer: Self-pay

## 2013-08-23 NOTE — Telephone Encounter (Signed)
Patient left a message stating that she needs an RX stating for wheelchair repairs.   Pt is dropping her wheelchair off Tuesday to [facility] in [place].  Please advise?

## 2013-08-25 NOTE — Telephone Encounter (Signed)
It is fine with me if we order this but I hope it is OK for [place] to write a generic rx since we do not know what is wrong. Please write her a misc rx saying she needs repairs

## 2013-08-26 ENCOUNTER — Telehealth: Payer: Self-pay | Admitting: Family Medicine

## 2013-08-26 NOTE — Telephone Encounter (Signed)
PLEASE FAX AN RX TO [facility] IN [address] NEEDS TO SPECIFY [place]

## 2013-08-26 NOTE — Telephone Encounter (Signed)
RX faxed to [phone]

## 2013-08-26 NOTE — Telephone Encounter (Signed)
Please look at previous note

## 2013-09-03 ENCOUNTER — Telehealth: Payer: Self-pay | Admitting: Family Medicine

## 2013-09-03 MED ORDER — POTASSIUM CHLORIDE CRYS ER 20 MEQ PO TBCR: EXTENDED_RELEASE_TABLET | ORAL | Status: DC

## 2013-09-03 NOTE — Telephone Encounter (Signed)
Refill potassium

## 2013-09-16 ENCOUNTER — Telehealth: Payer: Self-pay | Admitting: Family Medicine

## 2013-09-16 DIAGNOSIS — D649 Anemia, unspecified: Secondary | ICD-10-CM

## 2013-09-16 DIAGNOSIS — I10 Essential (primary) hypertension: Secondary | ICD-10-CM

## 2013-09-16 DIAGNOSIS — E785 Hyperlipidemia, unspecified: Secondary | ICD-10-CM

## 2013-09-16 NOTE — Telephone Encounter (Signed)
Orders entered

## 2013-09-16 NOTE — Telephone Encounter (Signed)
Patient will be coming in soon for labs prior to her next appointment  Labs prior to visit lipid, renal, cbc, tsh, hepatic

## 2013-09-18 LAB — CBC
HCT: 41.2 % (ref 36.0–46.0)
Hemoglobin: 13.7 g/dL (ref 12.0–15.0)
MCH: 26.6 pg (ref 26.0–34.0)
MCHC: 33.3 g/dL (ref 30.0–36.0)
MCV: 80 fL (ref 78.0–100.0)
Platelets: 243 10*3/uL (ref 150–400)
RBC: 5.15 MIL/uL — ABNORMAL HIGH (ref 3.87–5.11)
RDW: 14.9 % (ref 11.5–15.5)
WBC: 6 10*3/uL (ref 4.0–10.5)

## 2013-09-18 LAB — HEPATIC FUNCTION PANEL
ALT: 15 U/L (ref 0–35)
AST: 14 U/L (ref 0–37)
Albumin: 3.8 g/dL (ref 3.5–5.2)
Alkaline Phosphatase: 78 U/L (ref 39–117)
Bilirubin, Direct: 0.1 mg/dL (ref 0.0–0.3)
Indirect Bilirubin: 0.3 mg/dL (ref 0.2–1.2)
Total Bilirubin: 0.4 mg/dL (ref 0.2–1.2)
Total Protein: 6.5 g/dL (ref 6.0–8.3)

## 2013-09-18 LAB — RENAL FUNCTION PANEL
Albumin: 3.8 g/dL (ref 3.5–5.2)
BUN: 5 mg/dL — ABNORMAL LOW (ref 6–23)
CO2: 31 mEq/L (ref 19–32)
Calcium: 9 mg/dL (ref 8.4–10.5)
Chloride: 103 mEq/L (ref 96–112)
Creat: 0.4 mg/dL — ABNORMAL LOW (ref 0.50–1.10)
Glucose, Bld: 87 mg/dL (ref 70–99)
Phosphorus: 3.5 mg/dL (ref 2.3–4.6)
Potassium: 4.1 mEq/L (ref 3.5–5.3)
Sodium: 139 mEq/L (ref 135–145)

## 2013-09-18 LAB — LIPID PANEL
Cholesterol: 199 mg/dL (ref 0–200)
HDL: 47 mg/dL (ref >39)
LDL Cholesterol: 127 mg/dL — ABNORMAL HIGH (ref 0–99)
Total CHOL/HDL Ratio: 4.2 Ratio
Triglycerides: 127 mg/dL (ref <150)
VLDL: 25 mg/dL (ref 0–40)

## 2013-09-19 LAB — TSH: TSH: 2.507 u[IU]/mL (ref 0.350–4.500)

## 2013-09-23 ENCOUNTER — Ambulatory Visit (INDEPENDENT_AMBULATORY_CARE_PROVIDER_SITE_OTHER): Payer: Medicare PPO | Admitting: Family Medicine

## 2013-09-23 ENCOUNTER — Encounter: Payer: Self-pay | Admitting: Family Medicine

## 2013-09-23 VITALS — BP 128/82 | HR 76 | Temp 98.0°F | Ht 71.5 in

## 2013-09-23 DIAGNOSIS — I10 Essential (primary) hypertension: Secondary | ICD-10-CM

## 2013-09-23 DIAGNOSIS — N319 Neuromuscular dysfunction of bladder, unspecified: Secondary | ICD-10-CM

## 2013-09-23 DIAGNOSIS — G8250 Quadriplegia, unspecified: Secondary | ICD-10-CM

## 2013-09-23 DIAGNOSIS — D649 Anemia, unspecified: Secondary | ICD-10-CM

## 2013-09-23 DIAGNOSIS — E785 Hyperlipidemia, unspecified: Secondary | ICD-10-CM

## 2013-09-23 DIAGNOSIS — Z0000 Encounter for general adult medical examination without abnormal findings: Secondary | ICD-10-CM

## 2013-09-23 LAB — LIPID PANEL
Cholesterol: 201 mg/dL — ABNORMAL HIGH (ref 0–200)
HDL: 40 mg/dL (ref >39)
LDL Cholesterol: 113 mg/dL — ABNORMAL HIGH (ref 0–99)
Total CHOL/HDL Ratio: 5 Ratio
Triglycerides: 240 mg/dL — ABNORMAL HIGH (ref <150)
VLDL: 48 mg/dL — ABNORMAL HIGH (ref 0–40)

## 2013-09-23 NOTE — Progress Notes (Signed)
Pre visit review using our clinic review tool, if applicable. No additional management support is needed unless otherwise documented below in the visit note. 

## 2013-09-23 NOTE — Patient Instructions (Signed)

## 2013-09-25 ENCOUNTER — Encounter: Payer: Self-pay | Admitting: Family Medicine

## 2013-09-25 NOTE — Assessment & Plan Note (Signed)
Well controlled, no changes to meds. Encouraged heart healthy diet such as the DASH diet and exercise as tolerated.  °

## 2013-09-25 NOTE — Progress Notes (Signed)
Patient ID: [name], female   DOB: 11/14/1950, 63 y.o.   MRN: [id number] [name] [id number] 17-Jul-1950 09/25/2013      Progress Note-Follow Up  Subjective  Chief Complaint  Chief Complaint  Patient presents with  . Follow-up    3 month    HPI  Patient is a 62 year old female in today for routine medical care. She is doing well. She's had no recent illness. She offers no acute concerns. She does acknowledge they've been eating out more lately secondary to her sister's worsening health and inability to cook. She also clarified his her history of sacral skin breakdown. She notes while in the hospital for procedure when they transferred her because a skin tear on her sacrum which eventually led to skin breakdown while she was in rehabilitation. Otherwise she's never had any trouble with sacral ulcers. Denies CP/palp/SOB/HA/congestion/fevers/GI or GU c/o. Taking meds as prescribed  Past Medical History  Diagnosis Date  . Hyperlipidemia   . Hypertension   . 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 Neurogenic bladder   . Bowel dysfunction     constipation and fecal retention  . History of kidney stones   . Obesity   . Sacral pressure ulcer 12/01/2012    H/o Follows with Dr Tomi Likens of Dermatology  . Pain in joint, shoulder region 12/01/2012    Long history B/l Follows with Dr Hassell Done at [facility]  . Mitral valve disorder 12/01/2012  . Postmenopausal bleeding 12/17/2012  . Vaginitis and vulvovaginitis 12/17/2012  . Arthritis   . Preventative health care 06/26/2013    Past Surgical History  Procedure Laterality Date  . Laminectomy  1981    AVM  . Transfer tendon hand  1982, 1983    to right hand  . Rotator cuff repair  1996, 1998    left shoulder  . Total shoulder replacement  2001    left  . Shoulder arthroscopy  2008    right  . Ventral hernia repair  08/17/2010    Postop ileus (Dr. Redmond Pulling)  . Hernia repair  3419    umbilical hernia repair     Family History  Problem Relation Age of Onset  . Cancer Mother     breast & melanoma  . Heart disease Father     CAD, MI  . Cancer Sister     melanoma  . Cancer Maternal Aunt     breast  . Cancer Paternal Aunt     breast  . Cancer Paternal Uncle     breast  . Cancer Paternal Aunt     breast    History   Social History  . Marital Status: Single    Spouse Name: N/A    Number of Children: N/A  . Years of Education: N/A   Occupational History  . Disabled     math teacher   Social History Main Topics  . Smoking status: Never Smoker   . Smokeless tobacco: Never Used  . Alcohol Use: No  . Drug Use: Not on file  . Sexual Activity: Not on file   Other Topics Concern  . Not on file   Social History Narrative   Sister is primary caregiver    Current Outpatient Prescriptions on File Prior to Visit  Medication Sig Dispense Refill  . baclofen (LIORESAL) 20 MG tablet Take 1/2 to 1 tablet by mouth three times a day.  90 each  3  .  clobetasol (TEMOVATE) 0.05 % external solution       . diazepam (VALIUM) 5 MG tablet Take 1 tablet (5 mg total) by mouth daily as needed for anxiety. 1/2 tablet by mouth at bedtime as needed.  30 tablet  2  . furosemide (LASIX) 20 MG tablet Take 1 tablet (20 mg total) by mouth 2 (two) times daily.  180 tablet  1  . HYDROcodone-acetaminophen (NORCO) 5-325 MG per tablet Take 1 tablet by mouth every 6 (six) hours as needed for pain.  40 tablet  1  . METRONIDAZOLE, TOPICAL, 0.75 % LOTN       . Multiple Vitamins-Calcium (ONE-A-DAY WOMENS FORMULA PO) Take 1 tablet by mouth daily.        Marland Kitchen oxybutynin (DITROPAN) 5 MG tablet Take 5 mg by mouth every morning.        . potassium chloride SA (K-DUR,KLOR-CON) 20 MEQ tablet Take 2 tablets by mouth two times a day.  360 tablet  0   No current facility-administered medications on file prior to visit.    Allergies  Allergen Reactions  . Adhesive [Tape] Other (See Comments)    Skin Irritant.    Review  of Systems  Review of Systems  Constitutional: Negative for fever and malaise/fatigue.  HENT: Negative for congestion.   Eyes: Negative for discharge.  Respiratory: Negative for shortness of breath.   Cardiovascular: Negative for chest pain, palpitations and leg swelling.  Gastrointestinal: Negative for nausea, abdominal pain and diarrhea.  Genitourinary: Negative for dysuria.  Musculoskeletal: Negative for falls.  Skin: Negative for rash.  Neurological: Negative for loss of consciousness and headaches.  Endo/Heme/Allergies: Negative for polydipsia.  Psychiatric/Behavioral: Negative for depression and suicidal ideas. The patient is not nervous/anxious and does not have insomnia.     Objective  BP 128/82  Pulse 76  Temp(Src) 98 F (36.7 C) (Oral)  Ht 5' 11.5" (1.816 m)  SpO2 99%  Physical Exam  Physical Exam  Constitutional: She is oriented to person, place, and time and well-developed, well-nourished, and in no distress. No distress.  HENT:  Head: Normocephalic and atraumatic.  Eyes: Conjunctivae are normal.  Neck: Neck supple. No thyromegaly present.  Cardiovascular: Normal rate, regular rhythm and normal heart sounds.   No murmur heard. Pulmonary/Chest: Effort normal and breath sounds normal. She has no wheezes.  Abdominal: She exhibits no distension and no mass.  Musculoskeletal: She exhibits no edema.  Lymphadenopathy:    She has no cervical adenopathy.  Neurological: She is alert and oriented to person, place, and time.  Skin: Skin is warm and dry. No rash noted. She is not diaphoretic.  Psychiatric: Memory, affect and judgment normal.    Lab Results  Component Value Date   TSH 2.507 09/16/2013   Lab Results  Component Value Date   WBC 6.0 09/16/2013   HGB 13.7 09/16/2013   HCT 41.2 09/16/2013   MCV 80.0 09/16/2013   PLT 243 09/16/2013   Lab Results  Component Value Date   CREATININE 0.40* 09/16/2013   BUN 5* 09/16/2013   NA 139 09/16/2013   K 4.1 09/16/2013   CL 103  09/16/2013   CO2 31 09/16/2013   Lab Results  Component Value Date   ALT 15 09/16/2013   AST 14 09/16/2013   ALKPHOS 78 09/16/2013   BILITOT 0.4 09/16/2013   Lab Results  Component Value Date   CHOL 201* 09/23/2013   Lab Results  Component Value Date   HDL 40 09/23/2013   Lab  Results  Component Value Date   LDLCALC 113* 09/23/2013   Lab Results  Component Value Date   TRIG 240* 09/23/2013   Lab Results  Component Value Date   CHOLHDL 5.0 09/23/2013     Assessment & Plan  HYPERTENSION Well controlled, no changes to meds. Encouraged heart healthy diet such as the DASH diet and exercise as tolerated.   HYPERLIPIDEMIA Encouraged heart healthy diet, increase exercise, avoid trans fats, consider a krill oil cap daily  ANEMIA resolved  Neurogenic bladder Manages catheter well

## 2013-09-25 NOTE — Assessment & Plan Note (Signed)
Manages catheter well

## 2013-09-25 NOTE — Assessment & Plan Note (Signed)
Encouraged heart healthy diet, increase exercise, avoid trans fats, consider a krill oil cap daily 

## 2013-09-25 NOTE — Assessment & Plan Note (Signed)
resolved 

## 2013-10-25 ENCOUNTER — Telehealth: Payer: Self-pay

## 2013-10-25 NOTE — Telephone Encounter (Signed)
Patient informed and states she has a company in [place] that will give them to her without an rx if its 20-30 or below.

## 2013-10-25 NOTE — Telephone Encounter (Signed)
Pt left a message stating that she used to wear compression stockings (30-40?)? She stated that her top of her feet have been swelling and she wandered what md thought about her wearing these again?  Please advise?

## 2013-10-25 NOTE — Telephone Encounter (Signed)
I would say that compression hose are a good idea but if she has to buy new ones would say she could start with the lighter weight ones at 10-20 mmhg, on in am off in pm. I like the company [employer]. Can get them local at a medical supply store or can order online. Elevate feet above heart for 15 minutes twice a day also

## 2013-12-05 ENCOUNTER — Telehealth: Payer: Self-pay | Admitting: Family Medicine

## 2013-12-05 MED ORDER — POTASSIUM CHLORIDE CRYS ER 20 MEQ PO TBCR: EXTENDED_RELEASE_TABLET | ORAL | Status: DC

## 2013-12-05 NOTE — Telephone Encounter (Signed)
Potassium chloride 20 mg tab qty 360 send to [facility]

## 2013-12-05 NOTE — Telephone Encounter (Signed)
Rx sent 

## 2013-12-21 ENCOUNTER — Emergency Department (HOSPITAL_COMMUNITY)
Admission: EM | Admit: 2013-12-21 | Discharge: 2013-12-21 | Disposition: A | Discharge status: Home / Self Care | Payer: Medicare PPO | Source: Home / Self Care | Attending: Emergency Medicine | Admitting: Emergency Medicine

## 2013-12-21 ENCOUNTER — Encounter (HOSPITAL_COMMUNITY): Payer: Self-pay | Admitting: Emergency Medicine

## 2013-12-21 DIAGNOSIS — L259 Unspecified contact dermatitis, unspecified cause: Secondary | ICD-10-CM

## 2013-12-21 MED ORDER — TRIAMCINOLONE ACETONIDE 0.1 % EX CREA: 1.0000 "application " | TOPICAL_CREAM | Freq: Three times a day (TID) | CUTANEOUS | Status: DC

## 2013-12-21 MED ORDER — CEPHALEXIN 500 MG PO CAPS: 500.0000 mg | ORAL_CAPSULE | Freq: Three times a day (TID) | ORAL | Status: DC

## 2013-12-21 NOTE — Discharge Instructions (Signed)

## 2013-12-21 NOTE — ED Notes (Signed)
Patient complain of rash under her right arm and right side Of back that she noticed this past wed No really itchy , just burns

## 2013-12-21 NOTE — ED Provider Notes (Signed)
  Chief Complaint    Chief Complaint  Patient presents with  . Rash    History of Present Illness      [name] is a 63 year old female who has had a four-day history of a mildly pruritic rash in her right axilla and right scapular area. The patient cannot think of anything she come in contact with him these areas. She has no other skin rash. She denies any difficulty breathing or wheezing. No swelling of the lips, tongue, or throat.  Review of Systems   Other than as noted above, the patient denies any of the following symptoms: Systemic:  No fever or chills. ENT:  No nasal congestion, rhinorrhea, sore throat, swelling of lips, tongue or throat. Resp:  No cough, wheezing, or shortness of breath.  Arabi    Past medical history, family history, social history, meds, and allergies were reviewed.   Physical Exam     Vital signs:  BP 142/79  Pulse 65  Temp(Src) 98.1 F (36.7 C) (Oral)  Resp 16  SpO2 97% Gen:  Alert, oriented, in no distress. ENT:  Pharynx clear, no intraoral lesions, moist mucous membranes. Lungs:  Clear to auscultation. Skin:  There is a patchy erythematous maculopapules on the posterior axillary fold on the right and over the scapula. None of these are vesicular. She also has some erythema on the dorsum of her right foot that has been present for years. The patient states she's got some relief with antibiotics in the past.  Assessment    The encounter diagnosis was Contact dermatitis.  This has the appearance of a contact dermatitis. Does not look like shingles. The dermatitis on the foot appears to be stasis dermatitis, although the patient states that she has gotten relief from this in the past with antibiotics and would like to try something just in case it is an infection.  Plan     1.  Meds:  The following meds were prescribed:   Discharge Medication List as of 12/21/2013  4:01 PM    START taking these medications   Details  cephALEXin (KEFLEX)  500 MG capsule Take 1 capsule (500 mg total) by mouth 3 (three) times daily., Starting 12/21/2013, Until Discontinued, Print    triamcinolone cream (KENALOG) 0.1 % Apply 1 application topically 3 (three) times daily., Starting 12/21/2013, Until Discontinued, Normal        2.  Patient Education/Counseling:  The patient was given appropriate handouts, self care instructions, and instructed in symptomatic relief.    3.  Follow up:  The patient was told to follow up here if no better in 3 to 4 days, or sooner if becoming worse in any way, and given some red flag symptoms such as worsening rash, fever, or difficulty breathing which would prompt immediate return.  Follow up here if necessary.      Harden Mo, MD 12/21/13 [phone]

## 2014-01-22 ENCOUNTER — Other Ambulatory Visit: Payer: Self-pay

## 2014-01-22 MED ORDER — FUROSEMIDE 20 MG PO TABS: 20.0000 mg | ORAL_TABLET | Freq: Two times a day (BID) | ORAL | Status: DC

## 2014-01-27 ENCOUNTER — Telehealth: Payer: Self-pay

## 2014-01-27 DIAGNOSIS — G8254 Quadriplegia, C5-C7 incomplete: Secondary | ICD-10-CM

## 2014-01-27 NOTE — Telephone Encounter (Signed)
We received an email from pt stating she needs a prescription and letter of medical necessity to [place] at [phone]  Attn: Haze Boyden  RX and letter faxed

## 2014-02-03 LAB — HM MAMMOGRAPHY

## 2014-02-04 ENCOUNTER — Encounter: Payer: Self-pay | Admitting: Family Medicine

## 2014-03-03 ENCOUNTER — Ambulatory Visit (INDEPENDENT_AMBULATORY_CARE_PROVIDER_SITE_OTHER): Payer: Medicare PPO

## 2014-03-03 DIAGNOSIS — Z23 Encounter for immunization: Secondary | ICD-10-CM

## 2014-03-06 ENCOUNTER — Other Ambulatory Visit: Payer: Self-pay

## 2014-03-06 MED ORDER — POTASSIUM CHLORIDE CRYS ER 20 MEQ PO TBCR: EXTENDED_RELEASE_TABLET | ORAL | Status: DC

## 2014-03-24 ENCOUNTER — Ambulatory Visit (INDEPENDENT_AMBULATORY_CARE_PROVIDER_SITE_OTHER): Payer: Medicare PPO | Admitting: Family Medicine

## 2014-03-24 ENCOUNTER — Encounter: Payer: Self-pay | Admitting: Family Medicine

## 2014-03-24 VITALS — BP 125/59 | HR 69 | Temp 98.0°F | Ht 71.5 in

## 2014-03-24 DIAGNOSIS — N319 Neuromuscular dysfunction of bladder, unspecified: Secondary | ICD-10-CM

## 2014-03-24 DIAGNOSIS — G8250 Quadriplegia, unspecified: Secondary | ICD-10-CM

## 2014-03-24 DIAGNOSIS — I10 Essential (primary) hypertension: Secondary | ICD-10-CM

## 2014-03-24 DIAGNOSIS — E785 Hyperlipidemia, unspecified: Secondary | ICD-10-CM

## 2014-03-24 LAB — HEPATIC FUNCTION PANEL
ALT: 15 U/L (ref 0–35)
AST: 16 U/L (ref 0–37)
Albumin: 3.1 g/dL — ABNORMAL LOW (ref 3.5–5.2)
Alkaline Phosphatase: 89 U/L (ref 39–117)
Bilirubin, Direct: 0.1 mg/dL (ref 0.0–0.3)
Total Bilirubin: 0.3 mg/dL (ref 0.2–1.2)
Total Protein: 7.3 g/dL (ref 6.0–8.3)

## 2014-03-24 LAB — RENAL FUNCTION PANEL
Albumin: 3.1 g/dL — ABNORMAL LOW (ref 3.5–5.2)
BUN: 10 mg/dL (ref 6–23)
CO2: 26 mEq/L (ref 19–32)
Calcium: 8.9 mg/dL (ref 8.4–10.5)
Chloride: 101 mEq/L (ref 96–112)
Creatinine, Ser: 0.5 mg/dL (ref 0.4–1.2)
GFR: 133.14 mL/min (ref >60.00)
Glucose, Bld: 162 mg/dL — ABNORMAL HIGH (ref 70–99)
Phosphorus: 3.5 mg/dL (ref 2.3–4.6)
Potassium: 3.6 mEq/L (ref 3.5–5.1)
Sodium: 135 mEq/L (ref 135–145)

## 2014-03-24 LAB — LIPID PANEL
Cholesterol: 204 mg/dL — ABNORMAL HIGH (ref 0–200)
HDL: 30.4 mg/dL — ABNORMAL LOW (ref >39.00)
NonHDL: 173.6
Total CHOL/HDL Ratio: 7
Triglycerides: 236 mg/dL — ABNORMAL HIGH (ref 0.0–149.0)
VLDL: 47.2 mg/dL — ABNORMAL HIGH (ref 0.0–40.0)

## 2014-03-24 LAB — CBC
HCT: 41.2 % (ref 36.0–46.0)
Hemoglobin: 13.3 g/dL (ref 12.0–15.0)
MCHC: 32.3 g/dL (ref 30.0–36.0)
MCV: 81.3 fl (ref 78.0–100.0)
Platelets: 235 10*3/uL (ref 150.0–400.0)
RBC: 5.07 Mil/uL (ref 3.87–5.11)
RDW: 14.7 % (ref 11.5–15.5)
WBC: 7.2 10*3/uL (ref 4.0–10.5)

## 2014-03-24 LAB — LDL CHOLESTEROL, DIRECT: Direct LDL: 139.2 mg/dL

## 2014-03-24 LAB — TSH: TSH: 1.07 u[IU]/mL (ref 0.35–4.50)

## 2014-03-24 NOTE — Patient Instructions (Signed)
Encouraged increased hydration and fiber in diet. Daily probiotics. If bowels not moving can use MOM 2 tbls po in 4 oz of warm prune juice by mouth every 2-3 days. If no results then repeat in 4 hours with  Dulcolax suppository pr, may repeat again in 4 more hours as needed. Seek care if symptoms worsen. Consider daily Miralax and/or Dulcolax if symptoms persist.    Constipation Constipation is when a person has fewer than three bowel movements a week, has difficulty having a bowel movement, or has stools that are dry, hard, or larger than normal. As people grow older, constipation is more common. If you try to fix constipation with medicines that make you have a bowel movement (laxatives), the problem may get worse. Long-term laxative use may cause the muscles of the colon to become weak. A low-fiber diet, not taking in enough fluids, and taking certain medicines may make constipation worse.  CAUSES   Certain medicines, such as antidepressants, pain medicine, iron supplements, antacids, and water pills.   Certain diseases, such as diabetes, irritable bowel syndrome (IBS), thyroid disease, or depression.   Not drinking enough water.   Not eating enough fiber-rich foods.   Stress or travel.   Lack of physical activity or exercise.   Ignoring the urge to have a bowel movement.   Using laxatives too much.  SIGNS AND SYMPTOMS   Having fewer than three bowel movements a week.   Straining to have a bowel movement.   Having stools that are hard, dry, or larger than normal.   Feeling full or bloated.   Pain in the lower abdomen.   Not feeling relief after having a bowel movement.  DIAGNOSIS  Your health care provider will take a medical history and perform a physical exam. Further testing may be done for severe constipation. Some tests may include:  A barium enema X-ray to examine your rectum, colon, and, sometimes, your small intestine.   A sigmoidoscopy to examine  your lower colon.   A colonoscopy to examine your entire colon. TREATMENT  Treatment will depend on the severity of your constipation and what is causing it. Some dietary treatments include drinking more fluids and eating more fiber-rich foods. Lifestyle treatments may include regular exercise. If these diet and lifestyle recommendations do not help, your health care provider may recommend taking over-the-counter laxative medicines to help you have bowel movements. Prescription medicines may be prescribed if over-the-counter medicines do not work.  HOME CARE INSTRUCTIONS   Eat foods that have a lot of fiber, such as fruits, vegetables, whole grains, and beans.  Limit foods high in fat and processed sugars, such as french fries, hamburgers, cookies, candies, and soda.   A fiber supplement may be added to your diet if you cannot get enough fiber from foods.   Drink enough fluids to keep your urine clear or pale yellow.   Exercise regularly or as directed by your health care provider.   Go to the restroom when you have the urge to go. Do not hold it.   Only take over-the-counter or prescription medicines as directed by your health care provider. Do not take other medicines for constipation without talking to your health care provider first.  SEEK IMMEDIATE MEDICAL CARE IF:   You have bright red blood in your stool.   Your constipation lasts for more than 4 days or gets worse.   You have abdominal or rectal pain.   You have thin, pencil-like stools.   You   have unexplained weight loss. MAKE SURE YOU:   Understand these instructions.  Will watch your condition.  Will get help right away if you are not doing well or get worse. Document Released: 02/26/2004 Document Revised: 06/04/2013 Document Reviewed: 03/11/2013 Exit[employer] Patient Information 2015 [employer]. This information is not intended to replace advice given to you by your health care provider. Make sure you  discuss any questions you have with your health care provider.  

## 2014-03-24 NOTE — Progress Notes (Signed)
Pre visit review using our clinic review tool, if applicable. No additional management support is needed unless otherwise documented below in the visit note. 

## 2014-03-30 NOTE — Assessment & Plan Note (Signed)
Has done very well with her voiding regiment lately, follows with [facility]

## 2014-03-30 NOTE — Assessment & Plan Note (Signed)
Receives excellent care from family and has not had any skin breakdown. Her bowel regimen is working but frustrates her because it is very time consuming. Continue same but consider warm prune juice and MOM every couple of days as needed

## 2014-03-30 NOTE — Assessment & Plan Note (Signed)
Encouraged heart healthy diet, increase exercise, avoid trans fats, consider a krill oil cap daily 

## 2014-03-30 NOTE — Progress Notes (Signed)
Patient ID: [name], female   DOB: 08/22/1950, 63 y.o.   MRN: [id number] [name] [id number] 04/15/1951 03/30/2014      Progress Note-Follow Up  Subjective  Chief Complaint  Chief Complaint  Patient presents with  . Follow-up    6 month    HPI  Patient is a 63 year old female in today for routine medical care. Patient is in today for followup. Feeling very well. He struggled somewhat to her bowel regimen but is able to evacuate most days. No blood or pain. Her complaint is he is very time consuming. No recent illness. Does have an occasional mild headache no neurologic symptoms associated. Denies CP/palp/SOB/HA/congestion/fevers/GI or GU c/o. Taking meds as prescribed  Past Medical History  Diagnosis Date  . Hyperlipidemia   . Hypertension   . 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 Neurogenic bladder   . Bowel dysfunction     constipation and fecal retention  . History of kidney stones   . Obesity   . Sacral pressure ulcer 12/01/2012    H/o Follows with Dr Tomi Likens of Dermatology  . Pain in joint, shoulder region 12/01/2012    Long history B/l Follows with [facility] Hassell [facility] at [facility]  . Mitral valve disorder 12/01/2012  . Postmenopausal bleeding 12/17/2012  . Vaginitis and vulvovaginitis 12/17/2012  . Arthritis   . Preventative health care 06/26/2013    Past Surgical History  Procedure Laterality Date  . Laminectomy  1981    AVM  . Transfer tendon hand  1982, 1983    to right hand  . Rotator cuff repair  1996, 1998    left shoulder  . Total shoulder replacement  2001    left  . Shoulder arthroscopy  2008    right  . Ventral hernia repair  08/17/2010    Postop ileus (Dr. Redmond Pulling)  . Hernia repair  6295    umbilical hernia repair    Family History  Problem Relation Age of Onset  . Cancer Mother     breast & melanoma  . Heart disease Father     CAD, MI  . Cancer Sister     melanoma  . Cancer Maternal Aunt     breast  . Cancer  Paternal Aunt     breast  . Cancer Paternal Uncle     breast  . Cancer Paternal Aunt     breast    History   Social History  . Marital Status: Single    Spouse Name: N/A    Number of Children: N/A  . Years of Education: N/A   Occupational History  . Disabled     math teacher   Social History Main Topics  . Smoking status: Never Smoker   . Smokeless tobacco: Never Used  . Alcohol Use: No  . Drug Use: Not on file  . Sexual Activity: Not on file   Other Topics Concern  . Not on file   Social History Narrative   Sister is primary caregiver    Current Outpatient Prescriptions on File Prior to Visit  Medication Sig Dispense Refill  . baclofen (LIORESAL) 20 MG tablet Take 1/2 to 1 tablet by mouth three times a day.  90 each  3  . clobetasol (TEMOVATE) 0.05 % external solution       . diazepam (VALIUM) 5 MG tablet Take 1 tablet (5 mg total) by mouth daily as needed for anxiety. 1/2  tablet by mouth at bedtime as needed.  30 tablet  2  . furosemide (LASIX) 20 MG tablet Take 1 tablet (20 mg total) by mouth 2 (two) times daily.  180 tablet  1  . HYDROcodone-acetaminophen (NORCO) 5-325 MG per tablet Take 1 tablet by mouth every 6 (six) hours as needed for pain.  40 tablet  1  . METRONIDAZOLE, TOPICAL, 0.75 % LOTN       . Multiple Vitamins-Calcium (ONE-A-DAY WOMENS FORMULA PO) Take 1 tablet by mouth daily.        Marland Kitchen oxybutynin (DITROPAN) 5 MG tablet Take 5 mg by mouth every morning.        . potassium chloride SA (K-DUR,KLOR-CON) 20 MEQ tablet Take 2 tablets by mouth two times a day.  360 tablet  0  . triamcinolone cream (KENALOG) 0.1 % Apply 1 application topically 3 (three) times daily.  60 g  2   No current facility-administered medications on file prior to visit.    Allergies  Allergen Reactions  . Adhesive [Tape] Other (See Comments)    Skin Irritant.    Review of Systems  Review of Systems  Constitutional: Negative for fever and malaise/fatigue.  HENT: Negative for  congestion.   Eyes: Negative for discharge.  Respiratory: Negative for shortness of breath.   Cardiovascular: Negative for chest pain, palpitations and leg swelling.  Gastrointestinal: Negative for nausea, abdominal pain and diarrhea.  Genitourinary: Negative for dysuria.  Musculoskeletal: Negative for falls.  Skin: Negative for rash.  Neurological: Negative for loss of consciousness and headaches.  Endo/Heme/Allergies: Negative for polydipsia.  Psychiatric/Behavioral: Negative for depression and suicidal ideas. The patient is not nervous/anxious and does not have insomnia.     Objective  BP 125/59  Pulse 69  Temp(Src) 98 F (36.7 C) (Oral)  Ht 5' 11.5" (1.816 m)  SpO2 97%  Physical Exam  Physical Exam  Constitutional: She is oriented to person, place, and time and well-developed, well-nourished, and in no distress. No distress.  HENT:  Head: Normocephalic and atraumatic.  Eyes: Conjunctivae are normal.  Neck: Neck supple. No thyromegaly present.  Cardiovascular: Normal rate, regular rhythm and normal heart sounds.   No murmur heard. Pulmonary/Chest: Effort normal and breath sounds normal. She has no wheezes.  Abdominal: She exhibits no distension and no mass.  Musculoskeletal: She exhibits no edema.  Lymphadenopathy:    She has no cervical adenopathy.  Neurological: She is alert and oriented to person, place, and time.  In wheelchair   Skin: Skin is warm and dry. No rash noted. She is not diaphoretic.  Psychiatric: Memory, affect and judgment normal.    Lab Results  Component Value Date   TSH 1.07 03/24/2014   Lab Results  Component Value Date   WBC 7.2 03/24/2014   HGB 13.3 03/24/2014   HCT 41.2 03/24/2014   MCV 81.3 03/24/2014   PLT 235.0 03/24/2014   Lab Results  Component Value Date   CREATININE 0.5 03/24/2014   BUN 10 03/24/2014   NA 135 03/24/2014   K 3.6 03/24/2014   CL 101 03/24/2014   CO2 26 03/24/2014   Lab Results  Component Value Date    ALT 15 03/24/2014   AST 16 03/24/2014   ALKPHOS 89 03/24/2014   BILITOT 0.3 03/24/2014   Lab Results  Component Value Date   CHOL 204* 03/24/2014   Lab Results  Component Value Date   HDL 30.40* 03/24/2014   Lab Results  Component Value Date   LDLCALC  113* 09/23/2013   Lab Results  Component Value Date   TRIG 236.0* 03/24/2014   Lab Results  Component Value Date   CHOLHDL 7 03/24/2014     Assessment & Plan  Essential hypertension Well controlled, no changes to meds. Encouraged heart healthy diet such as the DASH diet and exercise as tolerated.   Hyperlipidemia Encouraged heart healthy diet, increase exercise, avoid trans fats, consider a krill oil cap daily  Neurogenic bladder Has done very well with her voiding regiment lately, follows with [facility] excellent care from family and has not had any skin breakdown. Her bowel regimen is working but frustrates her because it is very time consuming. Continue same but consider warm prune juice and MOM every couple of days as needed

## 2014-03-30 NOTE — Assessment & Plan Note (Signed)
Well controlled, no changes to meds. Encouraged heart healthy diet such as the DASH diet and exercise as tolerated.  °

## 2014-05-12 ENCOUNTER — Ambulatory Visit (INDEPENDENT_AMBULATORY_CARE_PROVIDER_SITE_OTHER): Payer: Medicare PPO | Admitting: Family Medicine

## 2014-05-12 ENCOUNTER — Encounter: Payer: Self-pay | Admitting: Family Medicine

## 2014-05-12 VITALS — BP 100/76 | HR 68 | Temp 98.1°F | Ht 71.5 in

## 2014-05-12 DIAGNOSIS — T148XXA Other injury of unspecified body region, initial encounter: Secondary | ICD-10-CM

## 2014-05-12 DIAGNOSIS — L03119 Cellulitis of unspecified part of limb: Secondary | ICD-10-CM

## 2014-05-12 DIAGNOSIS — I10 Essential (primary) hypertension: Secondary | ICD-10-CM

## 2014-05-12 DIAGNOSIS — T148 Other injury of unspecified body region: Secondary | ICD-10-CM

## 2014-05-12 MED ORDER — CEPHALEXIN 500 MG PO CAPS: 500.0000 mg | ORAL_CAPSULE | Freq: Three times a day (TID) | ORAL | Status: DC

## 2014-05-12 MED ORDER — MUPIROCIN 2 % EX OINT: 1.0000 "application " | TOPICAL_OINTMENT | Freq: Two times a day (BID) | CUTANEOUS | Status: DC

## 2014-05-12 NOTE — Patient Instructions (Signed)
Witch [place]el astringent and bactroban to foot once or twice daily only take antibiotic pills if worsens or systemic symptoms occurCellulitis Cellulitis is an infection of the skin and the tissue beneath it. The infected area is usually red and tender. Cellulitis occurs most often in the arms and lower legs.  CAUSES  Cellulitis is caused by bacteria that enter the skin through cracks or c   uts in the skin. The most common types of bacteria that cause cellulitis are staphylococci and streptococci. SIGNS AND SYMPTOMS   Redness and warmth.  Swelling.  Tenderness or pain.  Fever. DIAGNOSIS  Your health care provider can usually determine what is wrong based on a physical exam. Blood tests may also be done. TREATMENT  Treatment usually involves taking an antibiotic medicine. HOME CARE INSTRUCTIONS   Take your antibiotic medicine as directed by your health care provider. Finish the antibiotic even if you start to feel better.  Keep the infected arm or leg elevated to reduce swelling.  Apply a warm cloth to the affected area up to 4 times per day to relieve pain.  Take medicines only as directed by your health care provider.  Keep all follow-up visits as directed by your health care provider. SEEK MEDICAL CARE IF:   You notice red streaks coming from the infected area.  Your red area gets larger or turns dark in color.  Your bone or joint underneath the infected area becomes painful after the skin has healed.  Your infection returns in the same area or another area.  You notice a swollen bump in the infected area.  You develop new symptoms.  You have a fever. SEEK IMMEDIATE MEDICAL CARE IF:   You feel very sleepy.  You develop vomiting or diarrhea.  You have a general ill feeling (malaise) with muscle aches and pains. MAKE SURE YOU:   Understand these instructions.  Will watch your condition.  Will get help right away if you are not doing well or get  worse. Document Released: 03/09/2005 Document Revised: 10/14/2013 Document Reviewed: 08/15/2011 [facility] Patient Information 2015 [place], [place]. This information is not intended to replace advice given to you by your health care provider. Make sure you discuss any questions you have with your health care provider.

## 2014-05-12 NOTE — Progress Notes (Signed)
Pre visit review using our clinic review tool, if applicable. No additional management support is needed unless otherwise documented below in the visit note. 

## 2014-05-18 ENCOUNTER — Encounter: Payer: Self-pay | Admitting: Family Medicine

## 2014-05-18 DIAGNOSIS — T148XXA Other injury of unspecified body region, initial encounter: Secondary | ICD-10-CM

## 2014-05-18 HISTORY — DX: Other injury of unspecified body region, initial encounter: T14.8XXA

## 2014-05-18 NOTE — Assessment & Plan Note (Signed)
Dropped a jar on her foot. Very mild swelling and ecchymosis. Encouraged to monitor and ice, report any worsening symptoms

## 2014-05-18 NOTE — Assessment & Plan Note (Signed)
Well controlled, no changes to meds. Encouraged heart healthy diet such as the DASH diet and exercise as tolerated.  °

## 2014-05-18 NOTE — Progress Notes (Signed)
[name]  [id number] [birth year] 05/18/2014      Progress Note-Follow Up  Subjective  Chief Complaint  Chief Complaint  Patient presents with  . dropped a jar on foot    dropped a jar of jelly on foot- right foot X 7 days    HPI  Patient is a 63 y.o. female in today for routine medical care. She is here today for evaluation of a contusion on the top of her foot. She is a quadriplegic and dropped a js\ar on the top of her foot. No pain but some swelling and ecchymosis have occurred. No other acute c/o. Denies CP/palp/SOB/HA/congestion/fevers/GI or GU c/o. Taking meds as prescribed  Past Medical History  Diagnosis Date  . Hyperlipidemia   . Hypertension   . 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 Neurogenic bladder   . Bowel dysfunction     constipation and fecal retention  . History of kidney stones   . Obesity   . Sacral pressure ulcer 12/01/2012    H/o Follows with Dr Tomi Likens of Dermatology  . Pain in joint, shoulder region 12/01/2012    Long history B/l Follow[facility] with [facility]ell [facility]  . Mitral valve disorder 12/01/2012  . Postmenopausal bleeding 12/17/2012  . Vaginitis and vulvovaginitis 12/17/2012  . Arthritis   . Preventative health care 06/26/2013  . Contusion 05/18/2014    Of foot    Past Surgical History  Procedure Laterality Date  . Laminectomy  1981    AVM  . Transfer tendon hand  1982, 1983    to right hand  . Rotator cuff repair  1996, 1998    left shoulder  . Total shoulder replacement  2001    left  . Shoulder arthroscopy  2008    right  . Ventral hernia repair  08/17/2010    Postop ileus (Dr. Redmond Pulling)  . Hernia repair  4196    umbilical hernia repair    Family History  Problem Relation Age of Onset  . Cancer Mother     breast & melanoma  . Heart disease Father     CAD, MI  . Cancer Sister     melanoma  . Cancer Maternal Aunt     breast  . Cancer Paternal Aunt     breast  . Cancer Paternal Uncle    breast  . Cancer Paternal Aunt     breast    History   Social History  . Marital Status: Single    Spouse Name: N/A    Number of Children: N/A  . Years of Education: N/A   Occupational History  . Disabled     math teacher   Social History Main Topics  . Smoking status: Never Smoker   . Smokeless tobacco: Never Used  . Alcohol Use: No  . Drug Use: Not on file  . Sexual Activity: Not on file   Other Topics Concern  . Not on file   Social History Narrative   Sister is primary caregiver    Current Outpatient Prescriptions on File Prior to Visit  Medication Sig Dispense Refill  . baclofen (LIORESAL) 20 MG tablet Take 1/2 to 1 tablet by mouth three times a day. 90 each 3  . clobetasol (TEMOVATE) 0.05 % external solution     . diazepam (VALIUM) 5 MG tablet Take 1 tablet (5 mg total) by mouth daily as needed for anxiety. 1/2 tablet by mouth at  bedtime as needed. 30 tablet 2  . furosemide (LASIX) 20 MG tablet Take 1 tablet (20 mg total) by mouth 2 (two) times daily. 180 tablet 1  . HYDROcodone-acetaminophen (NORCO) 5-325 MG per tablet Take 1 tablet by mouth every 6 (six) hours as needed for pain. 40 tablet 1  . METRONIDAZOLE, TOPICAL, 0.75 % LOTN     . Multiple Vitamins-Calcium (ONE-A-DAY WOMENS FORMULA PO) Take 1 tablet by mouth daily.      Marland Kitchen oxybutynin (DITROPAN) 5 MG tablet Take 5 mg by mouth every morning.      . potassium chloride SA (K-DUR,KLOR-CON) 20 MEQ tablet Take 2 tablets by mouth two times a day. 360 tablet 0  . triamcinolone cream (KENALOG) 0.1 % Apply 1 application topically 3 (three) times daily. 60 g 2   No current facility-administered medications on file prior to visit.    Allergies  Allergen Reactions  . Adhesive [Tape] Other (See Comments)    Skin Irritant.    Review of Systems  Review of Systems  Constitutional: Negative for fever and malaise/fatigue.  HENT: Negative for congestion.   Eyes: Negative for discharge.  Respiratory: Negative for  shortness of breath.   Cardiovascular: Negative for chest pain, palpitations and leg swelling.  Gastrointestinal: Negative for nausea, abdominal pain and diarrhea.  Genitourinary: Negative for dysuria.  Musculoskeletal: Negative for falls.       Ecchymosis top of foot. slightl  Skin: Negative for rash.  Neurological: Negative for loss of consciousness and headaches.       Quadriplegia.   Endo/Heme/Allergies: Negative for polydipsia.  Psychiatric/Behavioral: Negative for depression and suicidal ideas. The patient is not nervous/anxious and does not have insomnia.     Objective  BP 100/76 mmHg  Pulse 68  Temp(Src) 98.1 F (36.7 C) (Oral)  Ht 5' 11.5" (1.816 m)  Wt   SpO2 100%  Physical Exam  Physical Exam  Constitutional: She is oriented to person, place, and time and well-developed, well-nourished, and in no distress. No distress.  HENT:  Head: Normocephalic and atraumatic.  Eyes: Conjunctivae are normal.  Neck: Neck supple. No thyromegaly present.  Cardiovascular: Normal rate, regular rhythm and normal heart sounds.   No murmur heard. Pulmonary/Chest: Effort normal and breath sounds normal. She has no wheezes.  Abdominal: She exhibits no distension and no mass.  Musculoskeletal: She exhibits no edema.  Lymphadenopathy:    She has no cervical adenopathy.  Neurological: She is alert and oriented to person, place, and time.  Quadriplegia.  Skin: Skin is warm and dry. No rash noted. She is not diaphoretic.  Ecchymosis top of foot.   Psychiatric: Memory, affect and judgment normal.    Lab Results  Component Value Date   TSH 1.07 03/24/2014   Lab Results  Component Value Date   WBC 7.2 03/24/2014   HGB 13.3 03/24/2014   HCT 41.2 03/24/2014   MCV 81.3 03/24/2014   PLT 235.0 03/24/2014   Lab Results  Component Value Date   CREATININE 0.5 03/24/2014   BUN 10 03/24/2014   NA 135 03/24/2014   K 3.6 03/24/2014   CL 101 03/24/2014   CO2 26 03/24/2014   Lab Results   Component Value Date   ALT 15 03/24/2014   AST 16 03/24/2014   ALKPHOS 89 03/24/2014   BILITOT 0.3 03/24/2014   Lab Results  Component Value Date   CHOL 204* 03/24/2014   Lab Results  Component Value Date   HDL 30.40* 03/24/2014   Lab Results  Component Value Date   LDLCALC 113* 09/23/2013   Lab Results  Component Value Date   TRIG 236.0* 03/24/2014   Lab Results  Component Value Date   CHOLHDL 7 03/24/2014     Assessment & Plan  Essential hypertension Well controlled, no changes to meds. Encouraged heart healthy diet such as the DASH diet and exercise as tolerated.   Contusion Dropped a jar on her foot. Very mild swelling and ecchymosis. Encouraged to monitor and ice, report any worsening symptoms

## 2014-05-22 ENCOUNTER — Telehealth: Payer: Self-pay

## 2014-05-22 NOTE — Telephone Encounter (Signed)
[name] [id number]  [name] would like for you to call her, she stated it was personal.

## 2014-05-22 NOTE — Telephone Encounter (Signed)
I called and spoke to patient and she was calling to tell me goodbye on my last day

## 2014-06-13 HISTORY — PX: BASAL CELL CARCINOMA EXCISION: SHX1214

## 2014-06-16 ENCOUNTER — Other Ambulatory Visit: Payer: Self-pay | Admitting: *Deleted

## 2014-06-16 MED ORDER — POTASSIUM CHLORIDE CRYS ER 20 MEQ PO TBCR: EXTENDED_RELEASE_TABLET | ORAL | Status: DC

## 2014-06-16 NOTE — Telephone Encounter (Signed)
Rx sent to the pharmacy by e-script.//AB/CMA 

## 2014-07-14 ENCOUNTER — Other Ambulatory Visit (HOSPITAL_BASED_OUTPATIENT_CLINIC_OR_DEPARTMENT_OTHER): Payer: Self-pay | Admitting: Orthopedic Surgery

## 2014-07-14 DIAGNOSIS — M25511 Pain in right shoulder: Secondary | ICD-10-CM

## 2014-07-19 ENCOUNTER — Ambulatory Visit (HOSPITAL_BASED_OUTPATIENT_CLINIC_OR_DEPARTMENT_OTHER): Payer: Medicare PPO

## 2014-07-29 ENCOUNTER — Other Ambulatory Visit: Payer: Self-pay | Admitting: Family Medicine

## 2014-07-29 MED ORDER — BACLOFEN 20 MG PO TABS: ORAL_TABLET | ORAL | Status: DC

## 2014-07-30 ENCOUNTER — Telehealth: Payer: Self-pay | Admitting: Family Medicine

## 2014-07-30 NOTE — Telephone Encounter (Signed)
Caller name: [name] Relation to pt: self Call back number: [phone] Pharmacy: [facility]  Reason for call:   Requesting furosemide refill

## 2014-07-31 MED ORDER — FUROSEMIDE 20 MG PO TABS: 20.0000 mg | ORAL_TABLET | Freq: Two times a day (BID) | ORAL | Status: DC

## 2014-07-31 NOTE — Telephone Encounter (Signed)
Refill done and patient informed 

## 2014-08-04 ENCOUNTER — Ambulatory Visit (HOSPITAL_COMMUNITY)
Admission: RE | Admit: 2014-08-04 | Discharge: 2014-08-04 | Disposition: A | Discharge status: Home / Self Care | Payer: Medicare PPO | Source: Ambulatory Visit | Attending: Orthopedic Surgery | Admitting: Orthopedic Surgery

## 2014-08-04 DIAGNOSIS — M62511 Muscle wasting and atrophy, not elsewhere classified, right shoulder: Secondary | ICD-10-CM | POA: Insufficient documentation

## 2014-08-04 DIAGNOSIS — X58XXXA Exposure to other specified factors, initial encounter: Secondary | ICD-10-CM | POA: Insufficient documentation

## 2014-08-04 DIAGNOSIS — M7551 Bursitis of right shoulder: Secondary | ICD-10-CM | POA: Insufficient documentation

## 2014-08-04 DIAGNOSIS — S46811A Strain of other muscles, fascia and tendons at shoulder and upper arm level, right arm, initial encounter: Secondary | ICD-10-CM | POA: Diagnosis not present

## 2014-08-04 DIAGNOSIS — M25511 Pain in right shoulder: Secondary | ICD-10-CM | POA: Diagnosis present

## 2014-08-04 DIAGNOSIS — M19011 Primary osteoarthritis, right shoulder: Secondary | ICD-10-CM | POA: Insufficient documentation

## 2014-08-04 DIAGNOSIS — M75121 Complete rotator cuff tear or rupture of right shoulder, not specified as traumatic: Secondary | ICD-10-CM | POA: Insufficient documentation

## 2014-08-27 ENCOUNTER — Encounter: Payer: Self-pay | Admitting: Rehabilitation

## 2014-08-27 ENCOUNTER — Ambulatory Visit: Payer: Medicare PPO | Attending: Orthopedic Surgery | Admitting: Rehabilitation

## 2014-08-27 DIAGNOSIS — M25511 Pain in right shoulder: Secondary | ICD-10-CM | POA: Diagnosis present

## 2014-08-28 NOTE — Therapy (Signed)
[facility] [address]  [place], [place], [address] Phone: [phone]   Fax:  [phone]  Physical Therapy Evaluation  Patient Details  Name: [name] MRN: [id number] Date of Birth: [birth year] Referring Provider:  Laurance Flatten, MD  Encounter Date: 08/27/2014      PT End of Session - 08/28/14 1015    Visit Number 1   Number of Visits 10   Date for PT Re-Evaluation 10/01/14   PT Start Time 7989   PT Stop Time 1530   PT Time Calculation (min) 45 min      Past Medical History  Diagnosis Date  . Hyperlipidemia   . Hypertension   . 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 Neurogenic bladder   . Bowel dysfunction     constipation and fecal retention  . History of kidney stones   . Obesity   . Sacral pressure ulcer 12/01/2012    H/o Follows with Dr Tomi Likens of Dermatology  . Pain in joint, shoulder region 12/01/2012    Long history B/l Follows with Dr Hassell Done at [facility]  . Mitral valve disorder 12/01/2012  . Postmenopausal bleeding 12/17/2012  . Vaginitis and vulvovaginitis 12/17/2012  . Arthritis   . Preventative health care 06/26/2013  . Contusion 05/18/2014    Of foot    Past Surgical History  Procedure Laterality Date  . Laminectomy  1981    AVM  . Transfer tendon hand  1982, 1983    to right hand  . Rotator cuff repair  1996, 1998    left shoulder  . Total shoulder replacement  2001    left  . Shoulder arthroscopy  2008    right  . Ventral hernia repair  08/17/2010    Postop ileus (Dr. Redmond Pulling)  . Hernia repair  2119    umbilical hernia repair    There were no vitals filed for this visit.  Visit Diagnosis:  Pain in joint, shoulder region, right - Plan: PT plan of care cert/re-cert      Subjective Assessment - 08/27/14 1447    Symptoms Pt presents with the need for a R shoulder replacement which is just coming at the wrong time.  Caring for elderly mother.  The shoulder is pretty painfree except with bad movement. The hardest thing right now is turning the steering wheel. Reports that the shoulders are not really affected by the quadriplegia. Does report that the R shoulder function  has not really diminshed except for lifting the vaccum. everything is just a little painful.     Pertinent History L shoulder replacement.  Thinks this started after pushing herself up off of her shower chair when she noticed a pop and collapse 1/22.  RC repair in 2001, SAD in 2007.    Patient Stated Goals decrease inflammation, decrease pain   Currently in Pain? Yes   Pain Score 3   at rest up to 3/10 also on aleve   Pain Location Shoulder  anterior deltoid, proximal bicep   Pain Orientation Right   Aggravating Factors  incorrect movements   Pain Relieving Factors rest, aleve, massage            [facility] PT Assessment - 08/28/14 0001    Assessment   Medical Diagnosis R shoulder OA   Onset Date 07/04/14   Next MD Visit not scheduled   Prior Therapy no   Precautions  Precautions None;Other (comment)   Precaution Comments has an adhesive allergy but reports that she will still try ionto if it will help.   low hot/cold tolerance at the shoulder   Restrictions   Weight Bearing Restrictions No   Home Environment   Living Enviornment Private residence   Living Arrangements Other relatives   Type of Independence Two level   Home Equipment Wheelchair - power;Shower seat;Hospital bed;Other (comment)  hoyer   Prior Function   Level of Independence Needs assistance with ADLs;Needs assistance with transfers   Observation/Other Assessments   Focus on Therapeutic Outcomes (FOTO)  52% limitation   Sensation   Additional Comments not tested but per pt it is variable all over her body   AROM   Right Shoulder Flexion 155 Degrees  pain   Right Shoulder ABduction 130 Degrees  pn   Right Shoulder Internal Rotation --  not  tested   Right Shoulder External Rotation --  behind head 80% with pain   Right Shoulder Horizontal ABduction --  fullpain   Right Shoulder Horizontal  ADduction --  full   Strength   Right Shoulder Flexion 4-/5  with shoulder hike and slight pain   Right Shoulder ABduction 3+/5  with pain   Right Shoulder Internal Rotation 4/5   Right Shoulder External Rotation 4-/5   Palpation   Palpation +1-2 ttp anterior shoulder near bicep tendon insertion      Eval performed US 20% to R anterior shoulder 1MHz, x 38min 1.2w/cm2 To to consider use of ionto patch due to adhesive allergy                     PT Education - 08/27/14 1400    Education provided Yes   Education Details Plan for PT   Person(s) Educated Patient   Methods Explanation   Comprehension Verbalized understanding          PT Short Term Goals - 08/28/14 1020    PT SHORT TERM GOAL #1   Title independent with HEP   Time 1   Period Weeks   Status New           PT Long Term Goals - 08/28/14 1021    PT LONG TERM GOAL #1   Title decrease pain to intermittent only in the L shoulder 4/20   Time 5   Period Weeks   Status New   PT LONG TERM GOAL #2   Title report AROM WFL to carry out all ADLs  4/20   Time 5   Period Weeks   Status New   PT LONG TERM GOAL #3   Title independent with HEP to prevent shoulder replacement  4/20   Time 5   Period Weeks   Status New               Plan - 08/27/14 1430    Clinical Impression Statement Pt presents with R shoulder pain and decreased AROM after injury while transferring out of her wheelchair.  According to MRI the rotator cuff displays 2 full tears with retraction and one almost full thickness tear as well as severe OA.  PT will attempt to decrease pain in the R shoulder and improve AROM.    Pt will benefit from skilled therapeutic intervention in order to improve on the following deficits Pain;Decreased range of motion   Rehab Potential Good    Clinical Impairments Affecting Rehab  Potential status of shoulder   PT Frequency 2x / week   PT Duration Other (comment)  up to 5 weeks   PT Treatment/Interventions Electrical Stimulation;Moist Heat;Ultrasound;Therapeutic exercise;Neuromuscular re-education;Manual techniques;Passive range of motion  taping, iontophoresis with dexamethasone   PT Next Visit Plan [place], ionto?, ROM, isometrics, STM, heat and estim.  give HEP          G-Codes - 09-19-2014 1023    Functional Assessment Tool Used FOTO 52%   Functional Limitation Carrying, moving and handling objects   Carrying, Moving and Handling Objects Current Status [phone]) At least 40 percent but less than 60 percent impaired, limited or restricted   Carrying, Moving and Handling Objects Goal Status (U0370) At least 20 percent but less than 40 percent impaired, limited or restricted       Problem List Patient Active Problem List   Diagnosis Date Noted  . Contusion 05/18/2014  . Preventative health care 06/26/2013  . Postmenopausal bleeding 12/17/2012  . Neurogenic bladder 12/01/2012  . Pain in joint, shoulder region 12/01/2012  . Mitral valve disorder 12/01/2012  . Rhinitis 01/03/2012  . CALLUS, RIGHT FOOT 10/30/2009  . ALOPECIA 10/06/2009  . INSOMNIA 08/04/2009  . ANEMIA 08/28/2008  . Hyperlipidemia 07/27/2008  . Essential hypertension 07/27/2008  . Quadriplegia 07/24/2008    Stark Bray, DPT, CMP 2014-09-19, 10:28 AM  [facility] [address]  [address] [place], [place], [address] Phone: [phone]   Fax:  [phone]

## 2014-09-01 ENCOUNTER — Ambulatory Visit: Payer: Medicare PPO | Admitting: Rehabilitation

## 2014-09-01 DIAGNOSIS — M25511 Pain in right shoulder: Secondary | ICD-10-CM

## 2014-09-01 NOTE — Therapy (Signed)
[facility] [address]  [place], [place], [address] Phone: [phone]   Fax:  [phone]  Physical Therapy Treatment  Patient Details  Name: [name] MRN: [id number] Date of Birth: 09/10/1950 Referring Provider:  Laurance Flatten, MD  Encounter Date: 09/01/2014      PT End of Session - 09/01/14 1323    Visit Number 2   Number of Visits 10   Date for PT Re-Evaluation 10/01/14   Authorization - Number of Visits 6   PT Start Time 1317   PT Stop Time 1400   PT Time Calculation (min) 43 min   Activity Tolerance Patient tolerated treatment well   Behavior During Therapy [facility] for tasks assessed/performed      Past Medical History  Diagnosis Date  . Hyperlipidemia   . Hypertension   . 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 Neurogenic bladder   . Bowel dysfunction     constipation and fecal retention  . History of kidney stones   . Obesity   . Sacral pressure ulcer 12/01/2012    H/o Follows with Dr Tomi Likens of Dermatology  . Pain in joint, shoulder region 12/01/2012    Long history B/l Follows with Dr Hassell Done at [facility]  . Mitral valve disorder 12/01/2012  . Postmenopausal bleeding 12/17/2012  . Vaginitis and vulvovaginitis 12/17/2012  . Arthritis   . Preventative health care 06/26/2013  . Contusion 05/18/2014    Of foot    Past Surgical History  Procedure Laterality Date  . Laminectomy  1981    AVM  . Transfer tendon hand  1982, 1983    to right hand  . Rotator cuff repair  1996, 1998    left shoulder  . Total shoulder replacement  2001    left  . Shoulder arthroscopy  2008    right  . Ventral hernia repair  08/17/2010    Postop ileus (Dr. Redmond Pulling)  . Hernia repair  6314    umbilical hernia repair    There were no vitals filed for this visit.  Visit Diagnosis:  Pain in joint, shoulder region, right      Subjective Assessment - 09/01/14 1320    Symptoms No pain  at the moment just soreness.    Currently in Pain? No/denies      Today's Treatment: [place] 20% to R anterior shoulder 1MHz, x 12min 1.2w/cm2 Seated Rt shoulder isometrics 5" holds x6 for flexion, abduction, IR, ER Seated Rt scapular squeeze 3" hold x10 with assist  Seated Rt serratus punches x10 Seated Rt bicep and tricep isometrics 5" holds x6  Iontophoresis (1/6) with dexamethasone to Rt anterior shoulder 80 mAmp/min. Pt educated on side-effects and possible reaction to adhesives. Pt to check patch every 30 minutes and only wear for 2 hours.         PT Education - 09/01/14 1404    Education provided Yes   Education Details Ionto patch wear time, side effects, possible skin reaction, checking patch every 30 minutes and only wearing 2 hours as a trail.    Person(s) Educated Patient   Methods Explanation;Handout   Comprehension Verbalized understanding          PT Short Term Goals - 08/28/14 1020    PT SHORT TERM GOAL #1   Title independent with HEP   Time 1   Period Weeks   Status New  PT Long Term Goals - 08/28/14 1021    PT LONG TERM GOAL #1   Title decrease pain to intermittent only in the L shoulder 4/20   Time 5   Period Weeks   Status New   PT LONG TERM GOAL #2   Title report AROM WFL to carry out all ADLs  4/20   Time 5   Period Weeks   Status New   PT LONG TERM GOAL #3   Title independent with HEP to prevent shoulder replacement  4/20   Time 5   Period Weeks   Status New               Plan - 09/01/14 1403    Clinical Impression Statement Attemped Ionto patch #1 today and are aware of pt's adhesive allergy. Pt still wanted to try so we decided on 2 hour wear time as a trial and checking the area every 30 minutes, pt agreed. Tolerated the rest of the treatment well without problems.  Pt only gets 6 visits per insurance.   PT Next Visit Plan Continue with modalities, STM, isometrics, give HEP        Problem List Patient Active  Problem List   Diagnosis Date Noted  . Contusion 05/18/2014  . Preventative health care 06/26/2013  . Postmenopausal bleeding 12/17/2012  . Neurogenic bladder 12/01/2012  . Pain in joint, shoulder region 12/01/2012  . Mitral valve disorder 12/01/2012  . Rhinitis 01/03/2012  . CALLUS, RIGHT FOOT 10/30/2009  . ALOPECIA 10/06/2009  . INSOMNIA 08/04/2009  . ANEMIA 08/28/2008  . Hyperlipidemia 07/27/2008  . Essential hypertension 07/27/2008  . Quadriplegia 07/24/2008    Barbette Hair, PTA 09/01/2014, 2:08 PM  [facility] [address]  [address] [place], [place], [address] Phone: [phone]   Fax:  [phone]

## 2014-09-03 ENCOUNTER — Ambulatory Visit: Payer: Medicare PPO | Admitting: Rehabilitation

## 2014-09-03 ENCOUNTER — Telehealth: Payer: Self-pay | Admitting: Family Medicine

## 2014-09-03 ENCOUNTER — Encounter: Payer: Self-pay | Admitting: Rehabilitation

## 2014-09-03 DIAGNOSIS — M25511 Pain in right shoulder: Secondary | ICD-10-CM

## 2014-09-03 NOTE — Telephone Encounter (Signed)
pre visit letter sent °

## 2014-09-03 NOTE — Therapy (Signed)
[facility] [address]  [place], [place], [address] Phone: [phone]   Fax:  [phone]  Physical Therapy Treatment  Patient Details  Name: [name] MRN: [id number] Date of Birth: 06/14/1950 Referring Provider:  Laurance Flatten, MD  Encounter Date: 09/03/2014      PT End of Session - 09/03/14 1529    Visit Number 3   Number of Visits 6   Date for PT Re-Evaluation 10/01/14   PT Start Time 1937   PT Stop Time 1528   PT Time Calculation (min) 43 min   Activity Tolerance Patient tolerated treatment well      Past Medical History  Diagnosis Date  . Hyperlipidemia   . Hypertension   . 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 Neurogenic bladder   . Bowel dysfunction     constipation and fecal retention  . History of kidney stones   . Obesity   . Sacral pressure ulcer 12/01/2012    H/o Follows with Dr Tomi Likens of Dermatology  . Pain in joint, shoulder region 12/01/2012    Long history B/l Follows with Dr Hassell Done at [facility]  . Mitral valve disorder 12/01/2012  . Postmenopausal bleeding 12/17/2012  . Vaginitis and vulvovaginitis 12/17/2012  . Arthritis   . Preventative health care 06/26/2013  . Contusion 05/18/2014    Of foot    Past Surgical History  Procedure Laterality Date  . Laminectomy  1981    AVM  . Transfer tendon hand  1982, 1983    to right hand  . Rotator cuff repair  1996, 1998    left shoulder  . Total shoulder replacement  2001    left  . Shoulder arthroscopy  2008    right  . Ventral hernia repair  08/17/2010    Postop ileus (Dr. Redmond Pulling)  . Hernia repair  9024    umbilical hernia repair    There were no vitals filed for this visit.  Visit Diagnosis:  Pain in joint, shoulder region, right      Subjective Assessment - 09/03/14 1440    Symptoms A little sore after last time.  liked the patch and had no reaction to the adhesive.    Currently in Pain?  No/denies   Pain Score --  still up to 3/10 with elevation   Aggravating Factors  elevation   Pain Relieving Factors rest, medication, massage            Today's Treatment: [place] 20% to R anterior shoulder 1MHz, x 8min 1.2w/cm2 Seated Rt shoulder isometrics 5" holds x6 for flexion, abduction, IR, ER Seated Rt scapular squeeze 5" hold x10 with assist in sitting away from the backrest Looped red band around wrist IR, ER, tricep extension, bicep curl x 10 each "Row" with manual resistance at forearm x 10  Reclined chair PROM to tolerance all directions  Iontophoresis (2/6) with dexamethasone to Rt anterior shoulder 80 mAmp/min. Pt educated on side-effects and possible reaction to adhesives. Pt to check patch every 30 minutes and only wear for 3 hours.                                    PT Short Term Goals - 08/28/14 1020    PT SHORT TERM GOAL #1   Title independent with HEP   Time 1  Period Weeks   Status New           PT Long Term Goals - 08/28/14 1021    PT LONG TERM GOAL #1   Title decrease pain to intermittent only in the L shoulder 4/20   Time 5   Period Weeks   Status New   PT LONG TERM GOAL #2   Title report AROM WFL to carry out all ADLs  4/20   Time 5   Period Weeks   Status New   PT LONG TERM GOAL #3   Title independent with HEP to prevent shoulder replacement  4/20   Time 5   Period Weeks   Status New               Plan - 09/03/14 1530    Clinical Impression Statement Increased pain with ER with theraband but otherwise tolerated treatment well. no adverse side effects from ionto x 2 hours. increased to 3 hours this time still with occasional checks.    PT Next Visit Plan Continue with modalities, STM, isometrics, give HEP        Problem List Patient Active Problem List   Diagnosis Date Noted  . Contusion 05/18/2014  . Preventative health care 06/26/2013  . Postmenopausal bleeding 12/17/2012  . Neurogenic  bladder 12/01/2012  . Pain in joint, shoulder region 12/01/2012  . Mitral valve disorder 12/01/2012  . Rhinitis 01/03/2012  . CALLUS, RIGHT FOOT 10/30/2009  . ALOPECIA 10/06/2009  . INSOMNIA 08/04/2009  . ANEMIA 08/28/2008  . Hyperlipidemia 07/27/2008  . Essential hypertension 07/27/2008  . Quadriplegia 07/24/2008    Stark Bray, DPT, CMP 09/03/2014, 3:32 PM  [facility] [address]  [address] [place], [place], [address] Phone: [phone]   Fax:  [phone]

## 2014-09-08 ENCOUNTER — Ambulatory Visit: Payer: Medicare PPO | Admitting: Rehabilitation

## 2014-09-10 ENCOUNTER — Ambulatory Visit: Payer: Medicare PPO | Admitting: Rehabilitation

## 2014-09-10 DIAGNOSIS — M25511 Pain in right shoulder: Secondary | ICD-10-CM

## 2014-09-10 NOTE — Therapy (Addendum)
[facility] [address]  [place], [place], [address] Phone: [phone]   Fax:  [phone]  Physical Therapy Treatment  Patient Details  Name: [name] MRN: [id number] Date of Birth: 1951-05-13 Referring Provider:  Laurance Flatten, MD  Encounter Date: 09/10/2014      PT End of Session - 09/10/14 1448    Visit Number 4   Number of Visits 6   Date for PT Re-Evaluation 10/01/14   PT Start Time 1450   PT Stop Time 1530   PT Time Calculation (min) 40 min   Activity Tolerance Patient tolerated treatment well   Behavior During Therapy [facility] for tasks assessed/performed      Past Medical History  Diagnosis Date  . Hyperlipidemia   . Hypertension   . 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 Neurogenic bladder   . Bowel dysfunction     constipation and fecal retention  . History of kidney stones   . Obesity   . Sacral pressure ulcer 12/01/2012    H/o Follows with Dr Tomi Likens of Dermatology  . Pain in joint, shoulder region 12/01/2012    Long history B/l Follows with Dr Hassell Done at [facility]  . Mitral valve disorder 12/01/2012  . Postmenopausal bleeding 12/17/2012  . Vaginitis and vulvovaginitis 12/17/2012  . Arthritis   . Preventative health care 06/26/2013  . Contusion 05/18/2014    Of foot    Past Surgical History  Procedure Laterality Date  . Laminectomy  1981    AVM  . Transfer tendon hand  1982, 1983    to right hand  . Rotator cuff repair  1996, 1998    left shoulder  . Total shoulder replacement  2001    left  . Shoulder arthroscopy  2008    right  . Ventral hernia repair  08/17/2010    Postop ileus (Dr. Redmond Pulling)  . Hernia repair  3149    umbilical hernia repair    There were no vitals filed for this visit.  Visit Diagnosis:  Pain in joint, shoulder region, right      Subjective Assessment - 09/10/14 1452    Symptoms Felt fine after last time. Notes some increased  soreness/tenderness today, could be from dusting. No reaction to the patch.         Today's Treatment: [place] 20% to R anterior shoulder 1MHz, x 110mn 1.2w/cm2 Seated Rt shoulder isometrics 5" holds x8 IR and ER Seated Rt scapular squeeze 5" hold x10 with assist in sitting away from the backrest Looped red band around wrist IR, ER, tricep extension, bicep curl, row  2x 10 each Seated Rt serratus punches x15 Seated Rt rhythmic stab x30" Seated circles at 90 degrees flexion x10  Iontophoresis (3/6) with dexamethasone to Rt anterior shoulder 80 mAmp/min. Pt educated on side-effects and possible reaction to adhesives. Pt to check patch every 30 minutes and wear 6 hours.            PT Short Term Goals - 08/28/14 1020    PT SHORT TERM GOAL #1   Title independent with HEP   Time 1   Period Weeks   Status New           PT Long Term Goals - 08/28/14 1021    PT LONG TERM GOAL #1   Title decrease pain to intermittent only in the L shoulder 4/20   Time 5  Period Weeks   Status New   PT LONG TERM GOAL #2   Title report AROM WFL to carry out all ADLs  4/20   Time 5   Period Weeks   Status New   PT LONG TERM GOAL #3   Title independent with HEP to prevent shoulder replacement  4/20   Time 5   Period Weeks   Status New      Late Entry G-code: Functional Assessment Tool Used FOTO 52%  Functional Limitation Carrying, moving and handling objects  Carrying, Moving and Handling Objects Goal Status (B6184) At least 20 percent but less than 40 percent impaired, limited or restricted  CJ  Carrying, Moving and Handling Objects Discharge Status [phone]) At least 40 percent but less than 60 percent impaired, limited or restricted CK          Plan - 09/10/14 1538    Clinical Impression Statement Good tolerance to new exercises and able to tolerate more reps today. Difficulty with rows with the band mainly due to the chair. Will try wearing the patch 6 hours this time with pt  to keep checking every 30 minutes.    PT Next Visit Plan Give HEP        Problem List Patient Active Problem List   Diagnosis Date Noted  . Contusion 05/18/2014  . Preventative health care 06/26/2013  . Postmenopausal bleeding 12/17/2012  . Neurogenic bladder 12/01/2012  . Pain in joint, shoulder region 12/01/2012  . Mitral valve disorder 12/01/2012  . Rhinitis 01/03/2012  . CALLUS, RIGHT FOOT 10/30/2009  . ALOPECIA 10/06/2009  . INSOMNIA 08/04/2009  . ANEMIA 08/28/2008  . Hyperlipidemia 07/27/2008  . Essential hypertension 07/27/2008  . Quadriplegia 07/24/2008    Barbette Hair, PTA 09/10/2014, 3:39 PM  [facility] [address]  [place], [place], [address] Phone: [phone]   Fax:  [phone]     PHYSICAL THERAPY DISCHARGE SUMMARY  Visits from Start of Care: 4  Current functional level related to goals / functional outcomes: See above; no goals met as pt requested d/c due to family circumstances   Remaining deficits: See above   Education / Equipment: HEP  Plan: Patient agrees to discharge.  Patient goals were not met. Patient is being discharged due to the patient's request.  ?????   Laureen Abrahams, PT, DPT 09/26/2014 9:11 AM  [facility] [place], [place] [address]  [phone] (office) [phone] (fax)

## 2014-09-15 ENCOUNTER — Ambulatory Visit: Payer: Medicare PPO | Admitting: Rehabilitation

## 2014-09-16 ENCOUNTER — Encounter: Payer: Medicare PPO | Admitting: Family Medicine

## 2014-09-17 ENCOUNTER — Ambulatory Visit: Payer: Medicare PPO | Admitting: Rehabilitation

## 2014-09-23 ENCOUNTER — Encounter: Payer: Medicare PPO | Admitting: Family Medicine

## 2014-09-30 ENCOUNTER — Telehealth: Payer: Self-pay | Admitting: Family Medicine

## 2014-09-30 NOTE — Telephone Encounter (Signed)
Pre Visit letter sent  °

## 2014-10-06 ENCOUNTER — Other Ambulatory Visit: Payer: Self-pay | Admitting: Dermatology

## 2014-10-16 ENCOUNTER — Other Ambulatory Visit: Payer: Self-pay | Admitting: Family Medicine

## 2014-10-16 ENCOUNTER — Telehealth: Payer: Self-pay | Admitting: Family Medicine

## 2014-10-16 MED ORDER — POTASSIUM CHLORIDE CRYS ER 20 MEQ PO TBCR: EXTENDED_RELEASE_TABLET | ORAL | Status: DC

## 2014-10-16 NOTE — Telephone Encounter (Signed)
Caller name: Izabel Relation to pt: self Call back number:[phone] Pharmacy: [facility]  Reason for call:   Requesting potassium refill

## 2014-10-16 NOTE — Telephone Encounter (Signed)
Called the patient informed just completed her refill.

## 2014-10-20 ENCOUNTER — Encounter: Payer: Self-pay | Admitting: *Deleted

## 2014-10-20 ENCOUNTER — Telehealth: Payer: Self-pay | Admitting: *Deleted

## 2014-10-20 NOTE — Telephone Encounter (Signed)
Pre-Visit Call completed with patient and chart updated.   Pre-Visit Info documented in Specialty Comments under [employer].    

## 2014-10-21 ENCOUNTER — Encounter: Payer: Self-pay | Admitting: Family Medicine

## 2014-10-21 ENCOUNTER — Ambulatory Visit (INDEPENDENT_AMBULATORY_CARE_PROVIDER_SITE_OTHER): Payer: Medicare PPO | Admitting: Family Medicine

## 2014-10-21 VITALS — BP 120/80 | HR 80 | Temp 98.6°F | Wt 220.0 lb

## 2014-10-21 DIAGNOSIS — E782 Mixed hyperlipidemia: Secondary | ICD-10-CM

## 2014-10-21 DIAGNOSIS — D649 Anemia, unspecified: Secondary | ICD-10-CM

## 2014-10-21 DIAGNOSIS — I10 Essential (primary) hypertension: Secondary | ICD-10-CM | POA: Diagnosis not present

## 2014-10-21 DIAGNOSIS — N319 Neuromuscular dysfunction of bladder, unspecified: Secondary | ICD-10-CM

## 2014-10-21 DIAGNOSIS — Z0000 Encounter for general adult medical examination without abnormal findings: Secondary | ICD-10-CM

## 2014-10-21 DIAGNOSIS — R739 Hyperglycemia, unspecified: Secondary | ICD-10-CM | POA: Diagnosis not present

## 2014-10-21 DIAGNOSIS — E785 Hyperlipidemia, unspecified: Secondary | ICD-10-CM

## 2014-10-21 DIAGNOSIS — G8250 Quadriplegia, unspecified: Secondary | ICD-10-CM

## 2014-10-21 MED ORDER — DIAZEPAM 5 MG PO TABS: 5.0000 mg | ORAL_TABLET | Freq: Every day | ORAL | Status: DC | PRN

## 2014-10-21 MED ORDER — BACLOFEN 20 MG PO TABS: ORAL_TABLET | ORAL | Status: DC

## 2014-10-21 MED ORDER — HYDROCODONE-ACETAMINOPHEN 5-325 MG PO TABS: 1.0000 | ORAL_TABLET | Freq: Four times a day (QID) | ORAL | Status: DC | PRN

## 2014-10-21 MED ORDER — FUROSEMIDE 20 MG PO TABS: 20.0000 mg | ORAL_TABLET | Freq: Two times a day (BID) | ORAL | Status: DC

## 2014-10-21 NOTE — Patient Instructions (Signed)
[employer] products on [email or web address] preventative  Red Yeast Rice    Preventive Care for Adults A healthy lifestyle and preventive care can promote health and wellness. Preventive health guidelines for women include the following key practices.  A routine yearly physical is a good way to check with your health care provider about your health and preventive screening. It is a chance to share any concerns and updates on your health and to receive a thorough exam.  Visit your dentist for a routine exam and preventive care every 6 months. Brush your teeth twice a day and floss once a day. Good oral hygiene prevents tooth decay and gum disease.  The frequency of eye exams is based on your age, health, family medical history, use of contact lenses, and other factors. Follow your health care provider's recommendations for frequency of eye exams.  Eat a healthy diet. Foods like vegetables, fruits, whole grains, low-fat dairy products, and lean protein foods contain the nutrients you need without too many calories. Decrease your intake of foods high in solid fats, added sugars, and salt. Eat the right amount of calories for you.Get information about a proper diet from your health care provider, if necessary.  Regular physical exercise is one of the most important things you can do for your health. Most adults should get at least 150 minutes of moderate-intensity exercise (any activity that increases your heart rate and causes you to sweat) each week. In addition, most adults need muscle-strengthening exercises on 2 or more days a week.  Maintain a healthy weight. The body mass index (BMI) is a screening tool to identify possible weight problems. It provides an estimate of body fat based on height and weight. Your health care provider can find your BMI and can help you achieve or maintain a healthy weight.For adults 20 years and older:  A BMI below 18.5 is considered underweight.  A BMI of 18.5  to 24.9 is normal.  A BMI of 25 to 29.9 is considered overweight.  A BMI of 30 and above is considered obese.  Maintain normal blood lipids and cholesterol levels by exercising and minimizing your intake of saturated fat. Eat a balanced diet with plenty of fruit and vegetables. Blood tests for lipids and cholesterol should begin at age 15 and be repeated every 5 years. If your lipid or cholesterol levels are high, you are over 50, or you are at high risk for heart disease, you may need your cholesterol levels checked more frequently.Ongoing high lipid and cholesterol levels should be treated with medicines if diet and exercise are not working.  If you smoke, find out from your health care provider how to quit. If you do not use tobacco, do not start.  Lung cancer screening is recommended for adults aged 19-80 years who are at high risk for developing lung cancer because of a history of smoking. A yearly low-dose CT scan of the lungs is recommended for people who have at least a 30-pack-year history of smoking and are a current smoker or have quit within the past 15 years. A pack year of smoking is smoking an average of 1 pack of cigarettes a day for 1 year (for example: 1 pack a day for 30 years or 2 packs a day for 15 years). Yearly screening should continue until the smoker has stopped smoking for at least 15 years. Yearly screening should be stopped for people who develop a health problem that would prevent them from having lung cancer treatment.  If you are pregnant, do not drink alcohol. If you are breastfeeding, be very cautious about drinking alcohol. If you are not pregnant and choose to drink alcohol, do not have more than 1 drink per day. One drink is considered to be 12 ounces (355 mL) of beer, 5 ounces (148 mL) of wine, or 1.5 ounces (44 mL) of liquor.  Avoid use of street drugs. Do not share needles with anyone. Ask for help if you need support or instructions about stopping the use of  drugs.  High blood pressure causes heart disease and increases the risk of stroke. Your blood pressure should be checked at least every 1 to 2 years. Ongoing high blood pressure should be treated with medicines if weight loss and exercise do not work.  If you are 55-79 years old, ask your health care provider if you should take aspirin to prevent strokes.  Diabetes screening involves taking a blood sample to check your fasting blood sugar level. This should be done once every 3 years, after age 45, if you are within normal weight and without risk factors for diabetes. Testing should be considered at a younger age or be carried out more frequently if you are overweight and have at least 1 risk factor for diabetes.  Breast cancer screening is essential preventive care for women. You should practice "breast self-awareness." This means understanding the normal appearance and feel of your breasts and may include breast self-examination. Any changes detected, no matter how small, should be reported to a health care provider. Women in their 20s and 30s should have a clinical breast exam (CBE) by a health care provider as part of a regular health exam every 1 to 3 years. After age 40, women should have a CBE every year. Starting at age 40, women should consider having a mammogram (breast X-ray test) every year. Women who have a family history of breast cancer should talk to their health care provider about genetic screening. Women at a high risk of breast cancer should talk to their health care providers about having an MRI and a mammogram every year.  Breast cancer gene (BRCA)-related cancer risk assessment is recommended for women who have family members with BRCA-related cancers. BRCA-related cancers include breast, ovarian, tubal, and peritoneal cancers. Having family members with these cancers may be associated with an increased risk for harmful changes (mutations) in the breast cancer genes BRCA1 and BRCA2.  Results of the assessment will determine the need for genetic counseling and BRCA1 and BRCA2 testing.  Routine pelvic exams to screen for cancer are no longer recommended for nonpregnant women who are considered low risk for cancer of the pelvic organs (ovaries, uterus, and vagina) and who do not have symptoms. Ask your health care provider if a screening pelvic exam is right for you.  If you have had past treatment for cervical cancer or a condition that could lead to cancer, you need Pap tests and screening for cancer for at least 20 years after your treatment. If Pap tests have been discontinued, your risk factors (such as having a new sexual partner) need to be reassessed to determine if screening should be resumed. Some women have medical problems that increase the chance of getting cervical cancer. In these cases, your health care provider may recommend more frequent screening and Pap tests.  The HPV test is an additional test that may be used for cervical cancer screening. The HPV test looks for the virus that can cause the cell changes   on the cervix. The cells collected during the Pap test can be tested for HPV. The HPV test could be used to screen women aged 30 years and older, and should be used in women of any age who have unclear Pap test results. After the age of 30, women should have HPV testing at the same frequency as a Pap test.  Colorectal cancer can be detected and often prevented. Most routine colorectal cancer screening begins at the age of 50 years and continues through age 75 years. However, your health care provider may recommend screening at an earlier age if you have risk factors for colon cancer. On a yearly basis, your health care provider may provide home test kits to check for hidden blood in the stool. Use of a small camera at the end of a tube, to directly examine the colon (sigmoidoscopy or colonoscopy), can detect the earliest forms of colorectal cancer. Talk to your health  care provider about this at age 50, when routine screening begins. Direct exam of the colon should be repeated every 5-10 years through age 75 years, unless early forms of pre-cancerous polyps or small growths are found.  People who are at an increased risk for hepatitis B should be screened for this virus. You are considered at high risk for hepatitis B if:  You were born in a country where hepatitis B occurs often. Talk with your health care provider about which countries are considered high risk.  Your parents were born in a high-risk country and you have not received a shot to protect against hepatitis B (hepatitis B vaccine).  You have HIV or AIDS.  You use needles to inject street drugs.  You live with, or have sex with, someone who has hepatitis B.  You get hemodialysis treatment.  You take certain medicines for conditions like cancer, organ transplantation, and autoimmune conditions.  Hepatitis C blood testing is recommended for all people born from 1945 through 1965 and any individual with known risks for hepatitis C.  Practice safe sex. Use condoms and avoid high-risk sexual practices to reduce the spread of sexually transmitted infections (STIs). STIs include gonorrhea, chlamydia, syphilis, trichomonas, herpes, HPV, and human immunodeficiency virus (HIV). Herpes, HIV, and HPV are viral illnesses that have no cure. They can result in disability, cancer, and death.  You should be screened for sexually transmitted illnesses (STIs) including gonorrhea and chlamydia if:  You are sexually active and are younger than 24 years.  You are older than 24 years and your health care provider tells you that you are at risk for this type of infection.  Your sexual activity has changed since you were last screened and you are at an increased risk for chlamydia or gonorrhea. Ask your health care provider if you are at risk.  If you are at risk of being infected with HIV, it is recommended  that you take a prescription medicine daily to prevent HIV infection. This is called preexposure prophylaxis (PrEP). You are considered at risk if:  You are a heterosexual woman, are sexually active, and are at increased risk for HIV infection.  You take drugs by injection.  You are sexually active with a partner who has HIV.  Talk with your health care provider about whether you are at high risk of being infected with HIV. If you choose to begin PrEP, you should first be tested for HIV. You should then be tested every 3 months for as long as you are taking PrEP.    Osteoporosis is a disease in which the bones lose minerals and strength with aging. This can result in serious bone fractures or breaks. The risk of osteoporosis can be identified using a bone density scan. Women ages 23 years and over and women at risk for fractures or osteoporosis should discuss screening with their health care providers. Ask your health care provider whether you should take a calcium supplement or vitamin D to reduce the rate of osteoporosis.  Menopause can be associated with physical symptoms and risks. Hormone replacement therapy is available to decrease symptoms and risks. You should talk to your health care provider about whether hormone replacement therapy is right for you.  Use sunscreen. Apply sunscreen liberally and repeatedly throughout the day. You should seek shade when your shadow is shorter than you. Protect yourself by wearing long sleeves, pants, a wide-brimmed hat, and sunglasses year round, whenever you are outdoors.  Once a month, do a whole body skin exam, using a mirror to look at the skin on your back. Tell your health care provider of new moles, moles that have irregular borders, moles that are larger than a pencil eraser, or moles that have changed in shape or color.  Stay current with required vaccines (immunizations).  Influenza vaccine. All adults should be immunized every year.  Tetanus,  diphtheria, and acellular pertussis (Td, Tdap) vaccine. Pregnant women should receive 1 dose of Tdap vaccine during each pregnancy. The dose should be obtained regardless of the length of time since the last dose. Immunization is preferred during the 27th-36th week of gestation. An adult who has not previously received Tdap or who does not know her vaccine status should receive 1 dose of Tdap. This initial dose should be followed by tetanus and diphtheria toxoids (Td) booster doses every 10 years. Adults with an unknown or incomplete history of completing a 3-dose immunization series with Td-containing vaccines should begin or complete a primary immunization series including a Tdap dose. Adults should receive a Td booster every 10 years.  Varicella vaccine. An adult without evidence of immunity to varicella should receive 2 doses or a second dose if she has previously received 1 dose. Pregnant females who do not have evidence of immunity should receive the first dose after pregnancy. This first dose should be obtained before leaving the health care facility. The second dose should be obtained 4-8 weeks after the first dose.  Human papillomavirus (HPV) vaccine. Females aged 13-26 years who have not received the vaccine previously should obtain the 3-dose series. The vaccine is not recommended for use in pregnant females. However, pregnancy testing is not needed before receiving a dose. If a female is found to be pregnant after receiving a dose, no treatment is needed. In that case, the remaining doses should be delayed until after the pregnancy. Immunization is recommended for any person with an immunocompromised condition through the age of 34 years if she did not get any or all doses earlier. During the 3-dose series, the second dose should be obtained 4-8 weeks after the first dose. The third dose should be obtained 24 weeks after the first dose and 16 weeks after the second dose.  Zoster vaccine. One dose  is recommended for adults aged 49 years or older unless certain conditions are present.  Measles, mumps, and rubella (MMR) vaccine. Adults born before [birth year] generally are considered immune to measles and mumps. Adults born in [birth year] or later should have 1 or more doses of MMR vaccine unless there is a  contraindication to the vaccine or there is laboratory evidence of immunity to each of the three diseases. A routine second dose of MMR vaccine should be obtained at least 28 days after the first dose for students attending postsecondary schools, health care workers, or international travelers. People who received inactivated measles vaccine or an unknown type of measles vaccine during 1963-1967 should receive 2 doses of MMR vaccine. People who received inactivated mumps vaccine or an unknown type of mumps vaccine before 1979 and are at high risk for mumps infection should consider immunization with 2 doses of MMR vaccine. For females of childbearing age, rubella immunity should be determined. If there is no evidence of immunity, females who are not pregnant should be vaccinated. If there is no evidence of immunity, females who are pregnant should delay immunization until after pregnancy. Unvaccinated health care workers born before [birth year] who lack laboratory evidence of measles, mumps, or rubella immunity or laboratory confirmation of disease should consider measles and mumps immunization with 2 doses of MMR vaccine or rubella immunization with 1 dose of MMR vaccine.  Pneumococcal 13-valent conjugate (PCV13) vaccine. When indicated, a person who is uncertain of her immunization history and has no record of immunization should receive the PCV13 vaccine. An adult aged 72 years or older who has certain medical conditions and has not been previously immunized should receive 1 dose of PCV13 vaccine. This PCV13 should be followed with a dose of pneumococcal polysaccharide (PPSV23) vaccine. The PPSV23 vaccine dose should be  obtained at least 8 weeks after the dose of PCV13 vaccine. An adult aged 33 years or older who has certain medical conditions and previously received 1 or more doses of PPSV23 vaccine should receive 1 dose of PCV13. The PCV13 vaccine dose should be obtained 1 or more years after the last PPSV23 vaccine dose.  Pneumococcal polysaccharide (PPSV23) vaccine. When PCV13 is also indicated, PCV13 should be obtained first. All adults aged 50 years and older should be immunized. An adult younger than age 14 years who has certain medical conditions should be immunized. Any person who resides in a nursing home or long-term care facility should be immunized. An adult smoker should be immunized. People with an immunocompromised condition and certain other conditions should receive both PCV13 and PPSV23 vaccines. People with human immunodeficiency virus (HIV) infection should be immunized as soon as possible after diagnosis. Immunization during chemotherapy or radiation therapy should be avoided. Routine use of PPSV23 vaccine is not recommended for American Indians, [place]atives, or people younger than 65 years unless there are medical conditions that require PPSV23 vaccine. When indicated, people who have unknown immunization and have no record of immunization should receive PPSV23 vaccine. One-time revaccination 5 years after the first dose of PPSV23 is recommended for people aged 19-64 years who have chronic kidney failure, nephrotic syndrome, asplenia, or immunocompromised conditions. People who received 1-2 doses of PPSV23 before age 39 years should receive another dose of PPSV23 vaccine at age 43 years or later if at least 5 years have passed since the previous dose. Doses of PPSV23 are not needed for people immunized with PPSV23 at or after age 74 years.  Meningococcal vaccine. Adults with asplenia or persistent complement component deficiencies should receive 2 doses of quadrivalent meningococcal conjugate  (MenACWY-D) vaccine. The doses should be obtained at least 2 months apart. Microbiologists working with certain meningococcal bacteria, [employer] recruits, people at risk during an outbreak, and people who travel to or live in countries with a high rate  of meningitis should be immunized. A first-year college student up through age 21 years who is living in a residence hall should receive a dose if she did not receive a dose on or after her 16th birthday. Adults who have certain high-risk conditions should receive one or more doses of vaccine.  Hepatitis A vaccine. Adults who wish to be protected from this disease, have certain high-risk conditions, work with hepatitis A-infected animals, work in hepatitis A research labs, or travel to or work in countries with a high rate of hepatitis A should be immunized. Adults who were previously unvaccinated and who anticipate close contact with an international adoptee during the first 60 days after arrival in the [place] from a country with a high rate of hepatitis A should be immunized.  Hepatitis B vaccine. Adults who wish to be protected from this disease, have certain high-risk conditions, may be exposed to blood or other infectious body fluids, are household contacts or sex partners of hepatitis B positive people, are clients or workers in certain care facilities, or travel to or work in countries with a high rate of hepatitis B should be immunized.  Haemophilus influenzae type b (Hib) vaccine. A previously unvaccinated person with asplenia or sickle cell disease or having a scheduled splenectomy should receive 1 dose of Hib vaccine. Regardless of previous immunization, a recipient of a hematopoietic stem cell transplant should receive a 3-dose series 6-12 months after her successful transplant. Hib vaccine is not recommended for adults with HIV infection. Preventive Services / Frequency Ages 19 to 39 years  Blood pressure check.** / Every 1 to 2  years.  Lipid and cholesterol check.** / Every 5 years beginning at age 20.  Clinical breast exam.** / Every 3 years for women in their 20s and 30s.  BRCA-related cancer risk assessment.** / For women who have family members with a BRCA-related cancer (breast, ovarian, tubal, or peritoneal cancers).  Pap test.** / Every 2 years from ages 21 through 29. Every 3 years starting at age 30 through age 65 or 70 with a history of 3 consecutive normal Pap tests.  HPV screening.** / Every 3 years from ages 30 through ages 65 to 70 with a history of 3 consecutive normal Pap tests.  Hepatitis C blood test.** / For any individual with known risks for hepatitis C.  Skin self-exam. / Monthly.  Influenza vaccine. / Every year.  Tetanus, diphtheria, and acellular pertussis (Tdap, Td) vaccine.** / Consult your health care provider. Pregnant women should receive 1 dose of Tdap vaccine during each pregnancy. 1 dose of Td every 10 years.  Varicella vaccine.** / Consult your health care provider. Pregnant females who do not have evidence of immunity should receive the first dose after pregnancy.  HPV vaccine. / 3 doses over 6 months, if 26 and younger. The vaccine is not recommended for use in pregnant females. However, pregnancy testing is not needed before receiving a dose.  Measles, mumps, rubella (MMR) vaccine.** / You need at least 1 dose of MMR if you were born in 1957 or later. You may also need a 2nd dose. For females of childbearing age, rubella immunity should be determined. If there is no evidence of immunity, females who are not pregnant should be vaccinated. If there is no evidence of immunity, females who are pregnant should delay immunization until after pregnancy.  Pneumococcal 13-valent conjugate (PCV13) vaccine.** / Consult your health care provider.  Pneumococcal polysaccharide (PPSV23) vaccine.** / 1 to 2 doses if   you smoke cigarettes or if you have certain conditions.  Meningococcal  vaccine.** / 1 dose if you are age 19 to 21 years and a first-year college student living in a residence hall, or have one of several medical conditions, you need to get vaccinated against meningococcal disease. You may also need additional booster doses.  Hepatitis A vaccine.** / Consult your health care provider.  Hepatitis B vaccine.** / Consult your health care provider.  Haemophilus influenzae type b (Hib) vaccine.** / Consult your health care provider. Ages 40 to 64 years  Blood pressure check.** / Every 1 to 2 years.  Lipid and cholesterol check.** / Every 5 years beginning at age 20 years.  Lung cancer screening. / Every year if you are aged 55-80 years and have a 30-pack-year history of smoking and currently smoke or have quit within the past 15 years. Yearly screening is stopped once you have quit smoking for at least 15 years or develop a health problem that would prevent you from having lung cancer treatment.  Clinical breast exam.** / Every year after age 40 years.  BRCA-related cancer risk assessment.** / For women who have family members with a BRCA-related cancer (breast, ovarian, tubal, or peritoneal cancers).  Mammogram.** / Every year beginning at age 40 years and continuing for as long as you are in good health. Consult with your health care provider.  Pap test.** / Every 3 years starting at age 30 years through age 65 or 70 years with a history of 3 consecutive normal Pap tests.  HPV screening.** / Every 3 years from ages 30 years through ages 65 to 70 years with a history of 3 consecutive normal Pap tests.  Fecal occult blood test (FOBT) of stool. / Every year beginning at age 50 years and continuing until age 75 years. You may not need to do this test if you get a colonoscopy every 10 years.  Flexible sigmoidoscopy or colonoscopy.** / Every 5 years for a flexible sigmoidoscopy or every 10 years for a colonoscopy beginning at age 50 years and continuing until age 75  years.  Hepatitis C blood test.** / For all people born from 1945 through 1965 and any individual with known risks for hepatitis C.  Skin self-exam. / Monthly.  Influenza vaccine. / Every year.  Tetanus, diphtheria, and acellular pertussis (Tdap/Td) vaccine.** / Consult your health care provider. Pregnant women should receive 1 dose of Tdap vaccine during each pregnancy. 1 dose of Td every 10 years.  Varicella vaccine.** / Consult your health care provider. Pregnant females who do not have evidence of immunity should receive the first dose after pregnancy.  Zoster vaccine.** / 1 dose for adults aged 60 years or older.  Measles, mumps, rubella (MMR) vaccine.** / You need at least 1 dose of MMR if you were born in 1957 or later. You may also need a 2nd dose. For females of childbearing age, rubella immunity should be determined. If there is no evidence of immunity, females who are not pregnant should be vaccinated. If there is no evidence of immunity, females who are pregnant should delay immunization until after pregnancy.  Pneumococcal 13-valent conjugate (PCV13) vaccine.** / Consult your health care provider.  Pneumococcal polysaccharide (PPSV23) vaccine.** / 1 to 2 doses if you smoke cigarettes or if you have certain conditions.  Meningococcal vaccine.** / Consult your health care provider.  Hepatitis A vaccine.** / Consult your health care provider.  Hepatitis B vaccine.** / Consult your health care provider.  Haemophilus   influenzae type b (Hib) vaccine.** / Consult your health care provider. Ages 65 years and over  Blood pressure check.** / Every 1 to 2 years.  Lipid and cholesterol check.** / Every 5 years beginning at age 20 years.  Lung cancer screening. / Every year if you are aged 55-80 years and have a 30-pack-year history of smoking and currently smoke or have quit within the past 15 years. Yearly screening is stopped once you have quit smoking for at least 15 years or  develop a health problem that would prevent you from having lung cancer treatment.  Clinical breast exam.** / Every year after age 40 years.  BRCA-related cancer risk assessment.** / For women who have family members with a BRCA-related cancer (breast, ovarian, tubal, or peritoneal cancers).  Mammogram.** / Every year beginning at age 40 years and continuing for as long as you are in good health. Consult with your health care provider.  Pap test.** / Every 3 years starting at age 30 years through age 65 or 70 years with 3 consecutive normal Pap tests. Testing can be stopped between 65 and 70 years with 3 consecutive normal Pap tests and no abnormal Pap or HPV tests in the past 10 years.  HPV screening.** / Every 3 years from ages 30 years through ages 65 or 70 years with a history of 3 consecutive normal Pap tests. Testing can be stopped between 65 and 70 years with 3 consecutive normal Pap tests and no abnormal Pap or HPV tests in the past 10 years.  Fecal occult blood test (FOBT) of stool. / Every year beginning at age 50 years and continuing until age 75 years. You may not need to do this test if you get a colonoscopy every 10 years.  Flexible sigmoidoscopy or colonoscopy.** / Every 5 years for a flexible sigmoidoscopy or every 10 years for a colonoscopy beginning at age 50 years and continuing until age 75 years.  Hepatitis C blood test.** / For all people born from 1945 through 1965 and any individual with known risks for hepatitis C.  Osteoporosis screening.** / A one-time screening for women ages 65 years and over and women at risk for fractures or osteoporosis.  Skin self-exam. / Monthly.  Influenza vaccine. / Every year.  Tetanus, diphtheria, and acellular pertussis (Tdap/Td) vaccine.** / 1 dose of Td every 10 years.  Varicella vaccine.** / Consult your health care provider.  Zoster vaccine.** / 1 dose for adults aged 60 years or older.  Pneumococcal 13-valent conjugate  (PCV13) vaccine.** / Consult your health care provider.  Pneumococcal polysaccharide (PPSV23) vaccine.** / 1 dose for all adults aged 65 years and older.  Meningococcal vaccine.** / Consult your health care provider.  Hepatitis A vaccine.** / Consult your health care provider.  Hepatitis B vaccine.** / Consult your health care provider.  Haemophilus influenzae type b (Hib) vaccine.** / Consult your health care provider. ** Family history and personal history of risk and conditions may change your health care provider's recommendations. Document Released: 07/26/2001 Document Revised: 10/14/2013 Document Reviewed: 10/25/2010 ExitCare Patient Information 2015 [employer]. This information is not intended to replace advice given to you by your health care provider. Make sure you discuss any questions you have with your health care provider.  

## 2014-10-21 NOTE — Progress Notes (Signed)
Pre visit review using our clinic review tool, if applicable. No additional management support is needed unless otherwise documented below in the visit note. 

## 2014-10-21 NOTE — Assessment & Plan Note (Signed)
Well controlled, no changes to meds. Encouraged heart healthy diet such as the DASH diet and exercise as tolerated.  °

## 2014-10-22 LAB — COMPREHENSIVE METABOLIC PANEL
ALT: 17 U/L (ref 0–35)
AST: 15 U/L (ref 0–37)
Albumin: 3.6 g/dL (ref 3.5–5.2)
Alkaline Phosphatase: 92 U/L (ref 39–117)
BUN: 9 mg/dL (ref 6–23)
CO2: 29 mEq/L (ref 19–32)
Calcium: 9 mg/dL (ref 8.4–10.5)
Chloride: 101 mEq/L (ref 96–112)
Creatinine, Ser: 0.43 mg/dL (ref 0.40–1.20)
GFR: 158.15 mL/min (ref >60.00)
Glucose, Bld: 82 mg/dL (ref 70–99)
Potassium: 4.2 mEq/L (ref 3.5–5.1)
Sodium: 135 mEq/L (ref 135–145)
Total Bilirubin: 0.2 mg/dL (ref 0.2–1.2)
Total Protein: 7.1 g/dL (ref 6.0–8.3)

## 2014-10-22 LAB — LDL CHOLESTEROL, DIRECT: Direct LDL: 142 mg/dL

## 2014-10-22 LAB — LIPID PANEL
Cholesterol: 207 mg/dL — ABNORMAL HIGH (ref 0–200)
HDL: 38.6 mg/dL — ABNORMAL LOW (ref >39.00)
NonHDL: 168.4
Total CHOL/HDL Ratio: 5
Triglycerides: 229 mg/dL — ABNORMAL HIGH (ref 0.0–149.0)
VLDL: 45.8 mg/dL — ABNORMAL HIGH (ref 0.0–40.0)

## 2014-10-22 LAB — HEMOGLOBIN A1C: Hgb A1c MFr Bld: 5.7 % (ref 4.6–6.5)

## 2014-10-22 LAB — CBC
HCT: 39.9 % (ref 36.0–46.0)
Hemoglobin: 13.5 g/dL (ref 12.0–15.0)
MCHC: 33.7 g/dL (ref 30.0–36.0)
MCV: 79.8 fl (ref 78.0–100.0)
Platelets: 232 10*3/uL (ref 150.0–400.0)
RBC: 5 Mil/uL (ref 3.87–5.11)
RDW: 14.6 % (ref 11.5–15.5)
WBC: 7.3 10*3/uL (ref 4.0–10.5)

## 2014-10-22 LAB — TSH: TSH: 2.98 u[IU]/mL (ref 0.35–4.50)

## 2014-10-27 ENCOUNTER — Encounter: Payer: Medicare PPO | Admitting: Family Medicine

## 2014-11-02 ENCOUNTER — Encounter: Payer: Self-pay | Admitting: Family Medicine

## 2014-11-02 DIAGNOSIS — Z0000 Encounter for general adult medical examination without abnormal findings: Secondary | ICD-10-CM

## 2014-11-02 HISTORY — DX: Encounter for general adult medical examination without abnormal findings: Z00.00

## 2014-11-02 NOTE — Assessment & Plan Note (Signed)
resolved 

## 2014-11-02 NOTE — Progress Notes (Signed)
[name] [id number] 1951/01/11 11/02/2014      Progress Note New Patient  Subjective  Chief Complaint  Chief Complaint  Patient presents with  . Annual Exam    HPI  Patient is a 64 year old female in today for routine medical care. She is in today accompanied by her sister who is her primary care provider. She is wheelchair-bound from quadriplegia secondary to an accident. He maintained very good home care. They manage an indwelling Foley catheter and her self-care very well. She first no acute complaints. No recent illness or hospitalization. Does continue to struggle with bilateral shoulder pain and uses hydrocodone and Valium for that. Struggles with some insomnia and uses diazepam for that as well. Denies CP/palp/SOB/HA/congestion/fevers/GI or GU c/o. Taking meds as prescribed  Past Medical History  Diagnosis Date  . Hyperlipidemia   . Hypertension   . 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 Neurogenic bladder   . Bowel dysfunction     constipation and fecal retention  . History of kidney stones   . Obesity   . Sacral pressure ulcer 12/01/2012    H/o Follows with Dr Tomi Likens of Dermatology  . Pain in joint, shoulder region 12/01/2012    Long history B/l Follows with Dr Hassell Done at [facility]  . Mitral valve disorder 12/01/2012  . Postmenopausal bleeding 12/17/2012  . Vaginitis and vulvovaginitis 12/17/2012  . Arthritis   . Preventative health care 06/26/2013  . Contusion 05/18/2014    Of foot    Past Surgical History  Procedure Laterality Date  . Laminectomy  1981    AVM  . Transfer tendon hand  1982, 1983    to right hand  . Rotator cuff repair  1996, 1998    left shoulder  . Total shoulder replacement  2001    left  . Shoulder arthroscopy  2008    right  . Ventral hernia repair  08/17/2010    Postop ileus (Dr. Redmond Pulling)  . Hernia repair  6160    umbilical hernia repair    Family History  Problem Relation Age of Onset  . Cancer  Mother     breast & melanoma  . Hypertension Mother   . Heart disease Father     CAD, MI, AFib  . Cancer Sister     melanoma  . Cancer Maternal Aunt     breast  . Cancer Paternal Aunt     breast  . Cancer Paternal Uncle     breast  . Cancer Paternal Aunt     breast  . Cancer Maternal Grandfather     melanoma  . Stroke Paternal Grandmother   . Heart disease Paternal Grandfather     History   Social History  . Marital Status: Single    Spouse Name: N/A  . Number of Children: N/A  . Years of Education: N/A   Occupational History  . Disabled     math teacher   Social History Main Topics  . Smoking status: Never Smoker   . Smokeless tobacco: Never Used  . Alcohol Use: No  . Drug Use: Not on file  . Sexual Activity: Not on file     Comment: lives with sister and mother, no major dietary restrictions   Other Topics Concern  . Not on file   Social History Narrative   Sister is primary caregiver    Current Outpatient Prescriptions on File Prior to Visit  Medication  Sig Dispense Refill  . Multiple Vitamins-Calcium (ONE-A-DAY WOMENS FORMULA PO) Take 1 tablet by mouth daily.      Marland Kitchen oxybutynin (DITROPAN) 5 MG tablet Take 5 mg by mouth every morning.      . potassium chloride SA (K-DUR,KLOR-CON) 20 MEQ tablet Take 2 tablets by mouth two times a day. 360 tablet 0  . triamcinolone cream (KENALOG) 0.1 % Apply 1 application topically 3 (three) times daily. 60 g 2   No current facility-administered medications on file prior to visit.    Allergies  Allergen Reactions  . Adhesive [Tape] Other (See Comments)    Skin Irritant.    Review of Systems  Review of Systems  Constitutional: Negative for fever, chills and malaise/fatigue.  HENT: Negative for congestion, hearing loss and nosebleeds.   Eyes: Negative for discharge.  Respiratory: Negative for cough, sputum production, shortness of breath and wheezing.   Cardiovascular: Negative for chest pain, palpitations and leg  swelling.  Gastrointestinal: Negative for heartburn, nausea, vomiting, abdominal pain, diarrhea, constipation and blood in stool.  Genitourinary: Negative for dysuria, urgency, frequency and hematuria.  Musculoskeletal: Negative for myalgias, back pain and falls.  Skin: Negative for rash.  Neurological: Negative for dizziness, tremors, sensory change, focal weakness, loss of consciousness, weakness and headaches.  Endo/Heme/Allergies: Negative for polydipsia. Does not bruise/bleed easily.  Psychiatric/Behavioral: Negative for depression and suicidal ideas. The patient is not nervous/anxious and does not have insomnia.     Objective  BP 120/80 mmHg  Pulse 80  Temp(Src) 98.6 F (37 C) (Oral)  Wt 220 lb (99.791 kg)  SpO2 97%  Physical Exam  Physical Exam  Constitutional: She is oriented to person, place, and time and well-developed, well-nourished, and in no distress. No distress.  HENT:  Head: Normocephalic and atraumatic.  Right Ear: External ear normal.  Left Ear: External ear normal.  Nose: Nose normal.  Mouth/Throat: Oropharynx is clear and moist. No oropharyngeal exudate.  Eyes: Conjunctivae are normal. Pupils are equal, round, and reactive to light. Right eye exhibits no discharge. Left eye exhibits no discharge. No scleral icterus.  Neck: Normal range of motion. Neck supple. No thyromegaly present.  Cardiovascular: Normal rate, regular rhythm, normal heart sounds and intact distal pulses.   No murmur heard. Pulmonary/Chest: Effort normal and breath sounds normal. No respiratory distress. She has no wheezes. She has no rales.  Abdominal: Soft. Bowel sounds are normal. She exhibits no distension and no mass. There is no tenderness.  Musculoskeletal: Normal range of motion. She exhibits no edema or tenderness.  Lymphadenopathy:    She has no cervical adenopathy.  Neurological: She is alert and oriented to person, place, and time. She displays abnormal reflex. No cranial nerve  deficit. She exhibits abnormal muscle tone. Coordination abnormal.  quadriplegia  Skin: Skin is warm and dry. No rash noted. She is not diaphoretic.  Psychiatric: Mood, memory and affect normal.       Assessment & Plan  Essential hypertension Well controlled, no changes to meds. Encouraged heart healthy diet such as the DASH diet and exercise as tolerated.

## 2014-11-02 NOTE — Assessment & Plan Note (Signed)
Lives with her sister and mother. Her home care is well managed, they are encouraged to investigate which home supplies medicare might cover.

## 2014-11-02 NOTE — Assessment & Plan Note (Signed)
Encouraged heart healthy diet, increase exercise, avoid trans fats, consider a krill oil cap daily 

## 2014-11-02 NOTE — Assessment & Plan Note (Signed)
Patient denies any difficulties at home. No trouble with ADLs, depression or falls. No recent changes to vision or hearing. Is UTD with immunizations. Is UTD with screening. Discussed Advanced Directives, patient agrees to bring [place] copies of documents if can. Encouraged heart healthy diet, exercise as tolerated and adequate sleep. Follows with [facility], declines colonoscopy and paps Labs ordered and reviewed See problem list for risk factors and AVS for preventative health recommendations

## 2014-11-02 NOTE — Assessment & Plan Note (Signed)
Patient encouraged to maintain heart healthy diet, regular exercise, adequate sleep. Consider daily probiotics. Take medications as prescribed 

## 2014-11-17 ENCOUNTER — Encounter: Payer: Self-pay | Admitting: Family Medicine

## 2015-01-12 ENCOUNTER — Other Ambulatory Visit: Payer: Self-pay | Admitting: Family Medicine

## 2015-01-12 MED ORDER — POTASSIUM CHLORIDE CRYS ER 20 MEQ PO TBCR: EXTENDED_RELEASE_TABLET | ORAL | Status: DC

## 2015-02-02 ENCOUNTER — Encounter: Payer: Self-pay | Admitting: Family Medicine

## 2015-02-05 ENCOUNTER — Other Ambulatory Visit: Payer: Self-pay | Admitting: Family Medicine

## 2015-02-05 DIAGNOSIS — G8250 Quadriplegia, unspecified: Secondary | ICD-10-CM

## 2015-03-18 ENCOUNTER — Encounter: Payer: Self-pay | Admitting: Family Medicine

## 2015-03-18 ENCOUNTER — Other Ambulatory Visit: Payer: Self-pay | Admitting: Family Medicine

## 2015-03-18 DIAGNOSIS — G8250 Quadriplegia, unspecified: Secondary | ICD-10-CM

## 2015-03-26 DIAGNOSIS — Z1231 Encounter for screening mammogram for malignant neoplasm of breast: Secondary | ICD-10-CM | POA: Diagnosis not present

## 2015-03-26 DIAGNOSIS — Z803 Family history of malignant neoplasm of breast: Secondary | ICD-10-CM | POA: Diagnosis not present

## 2015-03-26 LAB — HM MAMMOGRAPHY

## 2015-03-30 ENCOUNTER — Encounter: Payer: Self-pay | Admitting: Family Medicine

## 2015-03-31 DIAGNOSIS — Z85828 Personal history of other malignant neoplasm of skin: Secondary | ICD-10-CM | POA: Diagnosis not present

## 2015-03-31 DIAGNOSIS — L821 Other seborrheic keratosis: Secondary | ICD-10-CM | POA: Diagnosis not present

## 2015-03-31 DIAGNOSIS — D485 Neoplasm of uncertain behavior of skin: Secondary | ICD-10-CM | POA: Diagnosis not present

## 2015-03-31 DIAGNOSIS — L820 Inflamed seborrheic keratosis: Secondary | ICD-10-CM | POA: Diagnosis not present

## 2015-04-14 ENCOUNTER — Other Ambulatory Visit: Payer: Self-pay | Admitting: Family Medicine

## 2015-04-14 MED ORDER — POTASSIUM CHLORIDE CRYS ER 20 MEQ PO TBCR: EXTENDED_RELEASE_TABLET | ORAL | Status: DC

## 2015-04-15 DIAGNOSIS — N3020 Other chronic cystitis without hematuria: Secondary | ICD-10-CM | POA: Diagnosis not present

## 2015-04-15 DIAGNOSIS — Z01419 Encounter for gynecological examination (general) (routine) without abnormal findings: Secondary | ICD-10-CM | POA: Diagnosis not present

## 2015-04-15 DIAGNOSIS — N200 Calculus of kidney: Secondary | ICD-10-CM | POA: Diagnosis not present

## 2015-04-16 ENCOUNTER — Ambulatory Visit: Payer: Medicare PPO | Admitting: Physical Therapy

## 2015-04-23 ENCOUNTER — Ambulatory Visit: Payer: Medicare PPO | Admitting: Family Medicine

## 2015-04-27 ENCOUNTER — Ambulatory Visit: Payer: Medicare PPO | Admitting: Family Medicine

## 2015-04-29 ENCOUNTER — Encounter: Payer: Self-pay | Admitting: Family Medicine

## 2015-05-11 ENCOUNTER — Ambulatory Visit: Payer: Medicare PPO | Admitting: Internal Medicine

## 2015-05-11 ENCOUNTER — Telehealth: Payer: Self-pay

## 2015-05-11 ENCOUNTER — Encounter: Payer: Self-pay | Admitting: Family Medicine

## 2015-05-11 ENCOUNTER — Ambulatory Visit (INDEPENDENT_AMBULATORY_CARE_PROVIDER_SITE_OTHER): Payer: Medicare PPO | Admitting: Family Medicine

## 2015-05-11 VITALS — BP 142/88 | HR 62 | Temp 98.9°F

## 2015-05-11 DIAGNOSIS — J209 Acute bronchitis, unspecified: Secondary | ICD-10-CM

## 2015-05-11 DIAGNOSIS — I10 Essential (primary) hypertension: Secondary | ICD-10-CM

## 2015-05-11 MED ORDER — AMOXICILLIN 500 MG PO CAPS: 500.0000 mg | ORAL_CAPSULE | Freq: Three times a day (TID) | ORAL | Status: DC

## 2015-05-11 NOTE — Progress Notes (Signed)
Pre visit review using our clinic review tool, if applicable. No additional management support is needed unless otherwise documented below in the visit note. 

## 2015-05-11 NOTE — Telephone Encounter (Signed)
I am willing to try and squeeze her in this afternoon it is not too busy if she can get here. Tomorrow is too crazy

## 2015-05-11 NOTE — Telephone Encounter (Signed)
Patient called wanting an appointment but informed her that you did not have any availability and offered her an appointment with your PA and patient declined. She asked that I send you a message to see if you could work her in some where. She has been congested for over a week and it has moved into her chest she is concerned because she is a quadriplegic. Please advise

## 2015-05-11 NOTE — Patient Instructions (Addendum)
Zinc 50 mg daily or Coldeeze daily, Elderberry liquid or caps, Vitamin C 500 to 1000 mg daily all when ill Probiotic daily such as Digestive Advantage or Felsenthal  Mucinex 2 x daily x 10 days  Acute Bronchitis Bronchitis is inflammation of the airways that extend from the windpipe into the lungs (bronchi). The inflammation often causes mucus to develop. This leads to a cough, which is the most common symptom of bronchitis.  In acute bronchitis, the condition usually develops suddenly and goes away over time, usually in a couple weeks. Smoking, allergies, and asthma can make bronchitis worse. Repeated episodes of bronchitis may cause further lung problems.  CAUSES Acute bronchitis is most often caused by the same virus that causes a cold. The virus can spread from person to person (contagious) through coughing, sneezing, and touching contaminated objects. SIGNS AND SYMPTOMS   Cough.   Fever.   Coughing up mucus.   Body aches.   Chest congestion.   Chills.   Shortness of breath.   Sore throat.  DIAGNOSIS  Acute bronchitis is usually diagnosed through a physical exam. Your health care provider will also ask you questions about your medical history. Tests, such as chest X-rays, are sometimes done to rule out other conditions.  TREATMENT  Acute bronchitis usually goes away in a couple weeks. Oftentimes, no medical treatment is necessary. Medicines are sometimes given for relief of fever or cough. Antibiotic medicines are usually not needed but may be prescribed in certain situations. In some cases, an inhaler may be recommended to help reduce shortness of breath and control the cough. A cool mist vaporizer may also be used to help thin bronchial secretions and make it easier to clear the chest.  HOME CARE INSTRUCTIONS  Get plenty of rest.   Drink enough fluids to keep your urine clear or pale yellow (unless you have a medical condition that requires fluid  restriction). Increasing fluids may help thin your respiratory secretions (sputum) and reduce chest congestion, and it will prevent dehydration.   Take medicines only as directed by your health care provider.  If you were prescribed an antibiotic medicine, finish it all even if you start to feel better.  Avoid smoking and secondhand smoke. Exposure to cigarette smoke or irritating chemicals will make bronchitis worse. If you are a smoker, consider using nicotine gum or skin patches to help control withdrawal symptoms. Quitting smoking will help your lungs heal faster.   Reduce the chances of another bout of acute bronchitis by washing your hands frequently, avoiding people with cold symptoms, and trying not to touch your hands to your mouth, nose, or eyes.   Keep all follow-up visits as directed by your health care provider.  SEEK MEDICAL CARE IF: Your symptoms do not improve after 1 week of treatment.  SEEK IMMEDIATE MEDICAL CARE IF:  You develop an increased fever or chills.   You have chest pain.   You have severe shortness of breath.  You have bloody sputum.   You develop dehydration.  You faint or repeatedly feel like you are going to pass out.  You develop repeated vomiting.  You develop a severe headache. MAKE SURE YOU:   Understand these instructions.  Will watch your condition.  Will get help right away if you are not doing well or get worse.   This information is not intended to replace advice given to you by your health care provider. Make sure you discuss any questions you have with your  health care provider.   Document Released: 07/07/2004 Document Revised: 06/20/2014 Document Reviewed: 11/20/2012 Elsevier Interactive Patient Education 2016 Elsevier Inc.  

## 2015-05-11 NOTE — Progress Notes (Signed)
VASSILIKI FRAGER [id number] Nov 01, 1950 05/11/2015      Patient Progress Note   Subjective  Chief Complaint  Chief Complaint  Patient presents with  . Cough    HPI  64 year old female presents with head and chest congestion for 7-10 days. It started with a sore throat and progressed to congestion. She has difficulty coughing and also endorses sinus congestion. Cough has been sporadic but she does produce yellow mucous for 3 days. Has been taking sudafed and mucinex since the weekend. Diarrhea x2 days. Afebrile, denies myalgias, pleuritic chest pain, and hemoptysis. Overall has felt tired and worn down.   Patient denies shortness of breath, chest pain,changes in urination, GI issues, recent fevers or illnesses    Past Medical History  Diagnosis Date  . Hyperlipidemia   . Hypertension   . Quadriplegia ([place]) 1981    Secondary to ruptured AVM C5-C6  . Neurogenic bladder   . Bowel dysfunction     constipation and fecal retention  . History of kidney stones   . Obesity   . Sacral pressure ulcer 12/01/2012    H/o Follows with Dr Tomi Likens of Dermatology  . Pain in joint, shoulder region 12/01/2012    Long history B/l Follows with Dr Hassell Done at [facility]  . Mitral valve disorder 12/01/2012  . Postmenopausal bleeding 12/17/2012  . Vaginitis and vulvovaginitis 12/17/2012  . Arthritis   . Preventative health care 06/26/2013  . Contusion 05/18/2014    Of foot  . Medicare annual wellness visit, subsequent 11/02/2014    Follows with [facility] Declines colonoscopy  No h/o abnormal pap, declines further paps    Past Surgical History  Procedure Laterality Date  . Laminectomy  1981    AVM  . Transfer tendon hand  1982, 1983    to right hand  . Rotator cuff repair  1996, 1998    left shoulder  . Total shoulder replacement  2001    left  . Shoulder arthroscopy  2008    right  . Ventral hernia repair  08/17/2010    Postop ileus (Dr. Redmond Pulling)  . Hernia repair  0000000   umbilical hernia repair    Family History  Problem Relation Age of Onset  . Cancer Mother     breast & melanoma  . Hypertension Mother   . Heart disease Father     CAD, MI, AFib  . Cancer Sister     melanoma  . Cancer Maternal Aunt     breast  . Cancer Paternal Aunt     breast  . Cancer Paternal Uncle     breast  . Cancer Paternal Aunt     breast  . Cancer Maternal Grandfather     melanoma  . Stroke Paternal Grandmother   . Heart disease Paternal Grandfather     Social History   Social History  . Marital Status: Single    Spouse Name: N/A  . Number of Children: N/A  . Years of Education: N/A   Occupational History  . Disabled     math teacher   Social History Main Topics  . Smoking status: Never Smoker   . Smokeless tobacco: Never Used  . Alcohol Use: No  . Drug Use: Not on file  . Sexual Activity: Not on file     Comment: lives with sister and mother, no major dietary restrictions   Other Topics Concern  . Not on file   Social History Narrative  Sister is primary caregiver    Current Outpatient Prescriptions on File Prior to Visit  Medication Sig Dispense Refill  . baclofen (LIORESAL) 20 MG tablet Take 1/2 to 1 tablet by mouth three times a day. 90 each 6  . diazepam (VALIUM) 5 MG tablet Take 1 tablet (5 mg total) by mouth daily as needed for anxiety. 1/2 tablet by mouth at bedtime as needed. 30 tablet 2  . furosemide (LASIX) 20 MG tablet Take 1 tablet (20 mg total) by mouth 2 (two) times daily. 180 tablet 2  . HYDROcodone-acetaminophen (NORCO) 5-325 MG per tablet Take 1 tablet by mouth every 6 (six) hours as needed. 40 tablet 0  . Multiple Vitamins-Calcium (ONE-A-DAY WOMENS FORMULA PO) Take 1 tablet by mouth daily.      Marland Kitchen oxybutynin (DITROPAN) 5 MG tablet Take 5 mg by mouth every morning.      . potassium chloride SA (K-DUR,KLOR-CON) 20 MEQ tablet Take 2 tablets by mouth two times a day. 360 tablet 0  . triamcinolone cream (KENALOG) 0.1 % Apply 1  application topically 3 (three) times daily. 60 g 2   No current facility-administered medications on file prior to visit.    Allergies  Allergen Reactions  . Adhesive [Tape] Other (See Comments)    Skin Irritant.    Review of Systems   Constitutional: Negative for fever. Positive for malaise/fatigue.  HENT: Positive for congestion.  Eyes: Negative for discharge.  Respiratory: Negative for shortness of breath.  Cardiovascular: Negative for chest pain, palpitations and leg swelling.  Gastrointestinal: Negative for nausea, abdominal pain. Positive for diarrhea.  Genitourinary: Negative for dysuria and urgency, hematuria and flank pain.  Musculoskeletal: Negative for myalgias and falls.  Skin: Negative for rash.  Neurological: Negative for loss of consciousness and headaches.  Endo/Heme/Allergies: Negative for polydipsia.  Psychiatric/Behavioral: Negative for depression and suicidal ideas. The patient is not nervous/anxious and does not have insomnia.   Objective  BP 132/98 mmHg  Pulse 92  Temp(Src) 98.9 F (37.2 C) (Oral)  SpO2 98%  Physical Exam   Constitutional: Oriented to person, place, and time. Appears well-nourished. No distress.  Eyes: EOM are normal. Pupils are equal, round, and reactive to light.  Cardiovascular: Normal rate and regular rhythm.  Pulmonary/Chest: Breath sounds normal.  Abdominal: Soft. Bowel sounds are normal.  Lymphadenopathy:   No cervical adenopathy.  Neurological: Alert and oriented to person, place, and time. Normal reflexes. No cranial nerve deficit.    Assessment & Plan  Acute Bronchitis -Recommend elder berry, zinc, and tea with honey to minimize duration of symptoms -Stay hydrated -Amoxicllin 500mg  BID x7days -Promethazine/codeine   Patient seen and examined with student agree with documentation See separate note for documentation

## 2015-05-14 ENCOUNTER — Ambulatory Visit: Payer: Medicare PPO | Attending: Family Medicine | Admitting: Physical Therapy

## 2015-05-14 DIAGNOSIS — G8250 Quadriplegia, unspecified: Secondary | ICD-10-CM | POA: Diagnosis not present

## 2015-05-16 ENCOUNTER — Encounter: Payer: Self-pay | Admitting: Family Medicine

## 2015-05-16 DIAGNOSIS — J209 Acute bronchitis, unspecified: Secondary | ICD-10-CM

## 2015-05-16 HISTORY — DX: Acute bronchitis, unspecified: J20.9

## 2015-05-16 NOTE — Assessment & Plan Note (Signed)
Well controlled, no changes to meds. Encouraged heart healthy diet such as the DASH diet and exercise as tolerated.  °

## 2015-05-16 NOTE — Assessment & Plan Note (Signed)
Started on Amoxicillin and cough syrup. Encouraged increased rest and hydration, add probiotics, zinc such as Coldeze or Xicam. Treat fevers as needed, elderberry and vitamin C prn

## 2015-05-16 NOTE — Progress Notes (Signed)
Subjective:    Patient ID: [name], female    DOB: [birth year], 64 y.o.   MRN: [id number]  Chief Complaint  Patient presents with  . Cough    HPI Patient is in today for evaluation of worsening respiratory concerns. She started having symptoms about a week ago. With head congestion. She is noting cough and malaise. Over past few days she is coughing more and having trouble sleeping secondary to cough. No GI complaints. No GU complaints  Past Medical History  Diagnosis Date  . Hyperlipidemia   . Hypertension   . Quadriplegia ([place]) 1981    Secondary to ruptured AVM C5-C6  . Neurogenic bladder   . Bowel dysfunction     constipation and fecal retention  . History of kidney stones   . Obesity   . Sacral pressure ulcer 12/01/2012    H/o Follows with Dr Tomi Likens of Dermatology  . Pain in joint, shoulder region 12/01/2012    Long history B/l Follows with Dr Hassell Done at [facility]  . Mitral valve disorder 12/01/2012  . Postmenopausal bleeding 12/17/2012  . Vaginitis and vulvovaginitis 12/17/2012  . Arthritis   . Preventative health care 06/26/2013  . Contusion 05/18/2014    Of foot  . Medicare annual wellness visit, subsequent 11/02/2014    Follows with [facility] Declines colonoscopy  No h/o abnormal pap, declines further paps  . Acute bronchitis 05/16/2015    Past Surgical History  Procedure Laterality Date  . Laminectomy  1981    AVM  . Transfer tendon hand  1982, 1983    to right hand  . Rotator cuff repair  1996, 1998    left shoulder  . Total shoulder replacement  2001    left  . Shoulder arthroscopy  2008    right  . Ventral hernia repair  08/17/2010    Postop ileus (Dr. Redmond Pulling)  . Hernia repair  0000000    umbilical hernia repair    Family History  Problem Relation Age of Onset  . Cancer Mother     breast & melanoma  . Hypertension Mother   . Heart disease Father     CAD, MI, AFib  . Cancer Sister     melanoma  . Cancer Maternal Aunt    breast  . Cancer Paternal Aunt     breast  . Cancer Paternal Uncle     breast  . Cancer Paternal Aunt     breast  . Cancer Maternal Grandfather     melanoma  . Stroke Paternal Grandmother   . Heart disease Paternal Grandfather     Social History   Social History  . Marital Status: Single    Spouse Name: N/A  . Number of Children: N/A  . Years of Education: N/A   Occupational History  . Disabled     math teacher   Social History Main Topics  . Smoking status: Never Smoker   . Smokeless tobacco: Never Used  . Alcohol Use: No  . Drug Use: Not on file  . Sexual Activity: Not on file     Comment: lives with sister and mother, no major dietary restrictions   Other Topics Concern  . Not on file   Social History Narrative   Sister is primary caregiver    Outpatient Prescriptions Prior to Visit  Medication Sig Dispense Refill  . baclofen (LIORESAL) 20 MG tablet Take 1/2 to 1 tablet by mouth three times a day. 90 each  6  . diazepam (VALIUM) 5 MG tablet Take 1 tablet (5 mg total) by mouth daily as needed for anxiety. 1/2 tablet by mouth at bedtime as needed. 30 tablet 2  . furosemide (LASIX) 20 MG tablet Take 1 tablet (20 mg total) by mouth 2 (two) times daily. 180 tablet 2  . HYDROcodone-acetaminophen (NORCO) 5-325 MG per tablet Take 1 tablet by mouth every 6 (six) hours as needed. 40 tablet 0  . Multiple Vitamins-Calcium (ONE-A-DAY WOMENS FORMULA PO) Take 1 tablet by mouth daily.      Marland Kitchen oxybutynin (DITROPAN) 5 MG tablet Take 5 mg by mouth every morning.      . potassium chloride SA (K-DUR,KLOR-CON) 20 MEQ tablet Take 2 tablets by mouth two times a day. 360 tablet 0  . triamcinolone cream (KENALOG) 0.1 % Apply 1 application topically 3 (three) times daily. 60 g 2   No facility-administered medications prior to visit.    Allergies  Allergen Reactions  . Adhesive [Tape] Other (See Comments)    Skin Irritant.    Review of Systems  Constitutional: Positive for  malaise/fatigue. Negative for fever.  HENT: Positive for congestion.   Eyes: Negative for discharge.  Respiratory: Positive for cough. Negative for shortness of breath.   Cardiovascular: Negative for chest pain, palpitations and leg swelling.  Gastrointestinal: Negative for nausea and abdominal pain.  Genitourinary: Negative for dysuria.  Musculoskeletal: Positive for myalgias. Negative for falls.  Skin: Negative for rash.  Neurological: Negative for loss of consciousness and headaches.  Endo/Heme/Allergies: Negative for environmental allergies.  Psychiatric/Behavioral: Negative for depression. The patient is not nervous/anxious.        Objective:    Physical Exam  Constitutional: She is oriented to person, place, and time. She appears well-developed and well-nourished. No distress.  HENT:  Head: Normocephalic and atraumatic.  Nose: Nose normal.  Eyes: Right eye exhibits no discharge. Left eye exhibits no discharge.  Neck: Normal range of motion. Neck supple.  Cardiovascular: Normal rate and regular rhythm.   No murmur heard. Pulmonary/Chest: Effort normal and breath sounds normal.  Abdominal: Soft. Bowel sounds are normal. There is no tenderness.  Musculoskeletal: She exhibits no edema.  Neurological: She is alert and oriented to person, place, and time.  Skin: Skin is warm and dry.  Psychiatric: She has a normal mood and affect.  Nursing note and vitals reviewed.   BP 142/88 mmHg  Pulse 62  Temp(Src) 98.9 F (37.2 C) (Oral)  SpO2 98% Wt Readings from Last 3 Encounters:  10/21/14 220 lb (99.791 kg)     Lab Results  Component Value Date   WBC 7.3 10/21/2014   HGB 13.5 10/21/2014   HCT 39.9 10/21/2014   PLT 232.0 10/21/2014   GLUCOSE 82 10/21/2014   CHOL 207* 10/21/2014   TRIG 229.0* 10/21/2014   HDL 38.60* 10/21/2014   LDLDIRECT 142.0 10/21/2014   LDLCALC 113* 09/23/2013   ALT 17 10/21/2014   AST 15 10/21/2014   NA 135 10/21/2014   K 4.2 10/21/2014   CL  101 10/21/2014   CREATININE 0.43 10/21/2014   BUN 9 10/21/2014   CO2 29 10/21/2014   TSH 2.98 10/21/2014   HGBA1C 5.7 10/21/2014    Lab Results  Component Value Date   TSH 2.98 10/21/2014   Lab Results  Component Value Date   WBC 7.3 10/21/2014   HGB 13.5 10/21/2014   HCT 39.9 10/21/2014   MCV 79.8 10/21/2014   PLT 232.0 10/21/2014   Lab Results  Component Value Date   NA 135 10/21/2014   K 4.2 10/21/2014   CO2 29 10/21/2014   GLUCOSE 82 10/21/2014   BUN 9 10/21/2014   CREATININE 0.43 10/21/2014   BILITOT 0.2 10/21/2014   ALKPHOS 92 10/21/2014   AST 15 10/21/2014   ALT 17 10/21/2014   PROT 7.1 10/21/2014   ALBUMIN 3.6 10/21/2014   CALCIUM 9.0 10/21/2014   GFR 158.15 10/21/2014   Lab Results  Component Value Date   CHOL 207* 10/21/2014   Lab Results  Component Value Date   HDL 38.60* 10/21/2014   Lab Results  Component Value Date   LDLCALC 113* 09/23/2013   Lab Results  Component Value Date   TRIG 229.0* 10/21/2014   Lab Results  Component Value Date   CHOLHDL 5 10/21/2014   Lab Results  Component Value Date   HGBA1C 5.7 10/21/2014       Assessment & Plan:   Problem List Items Addressed This Visit    Acute bronchitis    Started on Amoxicillin and cough syrup. Encouraged increased rest and hydration, add probiotics, zinc such as Coldeze or Xicam. Treat fevers as needed, elderberry and vitamin C prn      Essential hypertension - Primary    Well controlled, no changes to meds. Encouraged heart healthy diet such as the DASH diet and exercise as tolerated.          I am having Ms. Granier start on amoxicillin. I am also having her maintain her Multiple Vitamins-Calcium (ONE-A-DAY WOMENS FORMULA PO), oxybutynin, triamcinolone cream, baclofen, furosemide, diazepam, HYDROcodone-acetaminophen, and potassium chloride SA.  Meds ordered this encounter  Medications  . amoxicillin (AMOXIL) 500 MG capsule    Sig: Take 1 capsule (500 mg total) by mouth 3  (three) times daily.    Dispense:  21 capsule    Refill:  0     Penni Homans, MD

## 2015-05-17 ENCOUNTER — Encounter: Payer: Self-pay | Admitting: Physical Therapy

## 2015-05-17 NOTE — Therapy (Signed)
[facility] [address] [place], [place], [address] Phone: [phone]   Fax:  [phone]  Physical Therapy Evaluation  Patient Details  Name: [name] MRN: [id number] Date of Birth: 04-Aug-1950 No Data Recorded  Encounter Date: 05/14/2015      PT End of Session - 05/17/15 1817    Visit Number 1   Number of Visits 1   Authorization Type [employer]   PT Start Time 1316   PT Stop Time 1435   PT Time Calculation (min) 79 min      Past Medical History  Diagnosis Date  . Hyperlipidemia   . Hypertension   . Quadriplegia ([facility]) 1981    Secondary to ruptured AVM C5-C6  . Neurogenic bladder   . Bowel dysfunction     constipation and fecal retention  . History of kidney stones   . Obesity   . Sacral pressure ulcer 12/01/2012    H/o Follows with Dr Duane Lope of Dermatology  . Pain in joint, shoulder region 12/01/2012    Long history B/l Follows with Dr Daphine Deutscher at [facility]  . Mitral valve disorder 12/01/2012  . Postmenopausal bleeding 12/17/2012  . Vaginitis and vulvovaginitis 12/17/2012  . Arthritis   . Preventative health care 06/26/2013  . Contusion 05/18/2014    Of foot  . Medicare annual wellness visit, subsequent 11/02/2014    Follows with [facility] Declines colonoscopy  No h/o abnormal pap, declines further paps  . Acute bronchitis 05/16/2015    Past Surgical History  Procedure Laterality Date  . Laminectomy  1981    AVM  . Transfer tendon hand  1982, 1983    to right hand  . Rotator cuff repair  1996, 1998    left shoulder  . Total shoulder replacement  2001    left  . Shoulder arthroscopy  2008    right  . Ventral hernia repair  08/17/2010    Postop ileus (Dr. Andrey Campanile)  . Hernia repair  2012    umbilical hernia repair    There were no vitals filed for this visit.  Visit Diagnosis:  Quadriplegia [facility]) - Plan: PT plan of care cert/re-cert    LMN to be completed when  specs received from vendor - wheelchair eval completed with Harrie Jeans, [employer] from [employer]   Power wheelchair with power tilt and recline to be requested and submitted                                Plan - 05/17/15 1818    Clinical Impression Statement pt qualifies for new power tilt/recline wheelchair   PT Frequency 1x / week   PT Treatment/Interventions Wheelchair mobility training         Problem List Patient Active Problem List   Diagnosis Date Noted  . Acute bronchitis 05/16/2015  . Medicare annual wellness visit, subsequent 11/02/2014  . Preventative health care 06/26/2013  . Postmenopausal bleeding 12/17/2012  . Neurogenic bladder 12/01/2012  . Pain in joint, shoulder region 12/01/2012  . Mitral valve disorder 12/01/2012  . Rhinitis 01/03/2012  . CALLUS, RIGHT FOOT 10/30/2009  . ALOPECIA 10/06/2009  . INSOMNIA 08/04/2009  . Anemia 08/28/2008  . Hyperlipidemia 07/27/2008  . Essential hypertension 07/27/2008  . Quadriplegia ([facility]) 07/24/2008    Philisha Weinel, Donavan Burnet, PT 05/17/2015, 6:33 PM  [facility] [address] [place], [place],  [address] Phone: [phone]   Fax:  [phone]  Name: KAYLI MILESKI MRN: [id number] Date of Birth: 1951/01/27

## 2015-05-18 ENCOUNTER — Ambulatory Visit (INDEPENDENT_AMBULATORY_CARE_PROVIDER_SITE_OTHER): Payer: Medicare PPO | Admitting: Family Medicine

## 2015-05-18 ENCOUNTER — Encounter: Payer: Self-pay | Admitting: Family Medicine

## 2015-05-18 VITALS — BP 128/86 | HR 81 | Temp 98.7°F | Wt 220.0 lb

## 2015-05-18 DIAGNOSIS — I10 Essential (primary) hypertension: Secondary | ICD-10-CM | POA: Diagnosis not present

## 2015-05-18 DIAGNOSIS — R739 Hyperglycemia, unspecified: Secondary | ICD-10-CM | POA: Diagnosis not present

## 2015-05-18 DIAGNOSIS — Z1159 Encounter for screening for other viral diseases: Secondary | ICD-10-CM

## 2015-05-18 DIAGNOSIS — E785 Hyperlipidemia, unspecified: Secondary | ICD-10-CM | POA: Diagnosis not present

## 2015-05-18 DIAGNOSIS — J209 Acute bronchitis, unspecified: Secondary | ICD-10-CM

## 2015-05-18 DIAGNOSIS — G8254 Quadriplegia, C5-C7 incomplete: Secondary | ICD-10-CM

## 2015-05-18 DIAGNOSIS — D649 Anemia, unspecified: Secondary | ICD-10-CM | POA: Diagnosis not present

## 2015-05-18 DIAGNOSIS — R7309 Other abnormal glucose: Secondary | ICD-10-CM | POA: Diagnosis not present

## 2015-05-18 DIAGNOSIS — Z23 Encounter for immunization: Secondary | ICD-10-CM | POA: Diagnosis not present

## 2015-05-18 HISTORY — DX: Hyperglycemia, unspecified: R73.9

## 2015-05-18 MED ORDER — POTASSIUM CHLORIDE CRYS ER 20 MEQ PO TBCR: EXTENDED_RELEASE_TABLET | ORAL | Status: DC

## 2015-05-18 MED ORDER — AMOXICILLIN 500 MG PO CAPS: 500.0000 mg | ORAL_CAPSULE | Freq: Three times a day (TID) | ORAL | Status: DC

## 2015-05-18 MED ORDER — FUROSEMIDE 20 MG PO TABS: 20.0000 mg | ORAL_TABLET | Freq: Two times a day (BID) | ORAL | Status: DC

## 2015-05-18 NOTE — Patient Instructions (Signed)

## 2015-05-18 NOTE — Progress Notes (Signed)
Pre visit review using our clinic review tool, if applicable. No additional management support is needed unless otherwise documented below in the visit note. 

## 2015-05-19 LAB — CBC
HCT: 41.1 % (ref 36.0–46.0)
Hemoglobin: 13.4 g/dL (ref 12.0–15.0)
MCHC: 32.7 g/dL (ref 30.0–36.0)
MCV: 81.9 fl (ref 78.0–100.0)
Platelets: 284 10*3/uL (ref 150.0–400.0)
RBC: 5.02 Mil/uL (ref 3.87–5.11)
RDW: 14.8 % (ref 11.5–15.5)
WBC: 9 10*3/uL (ref 4.0–10.5)

## 2015-05-19 LAB — TSH: TSH: 2.69 u[IU]/mL (ref 0.35–4.50)

## 2015-05-19 LAB — HEPATITIS C ANTIBODY: HCV Ab: NEGATIVE

## 2015-05-19 LAB — COMPREHENSIVE METABOLIC PANEL
ALT: 15 U/L (ref 0–35)
AST: 14 U/L (ref 0–37)
Albumin: 3.6 g/dL (ref 3.5–5.2)
Alkaline Phosphatase: 98 U/L (ref 39–117)
BUN: 12 mg/dL (ref 6–23)
CO2: 28 mEq/L (ref 19–32)
Calcium: 9.3 mg/dL (ref 8.4–10.5)
Chloride: 102 mEq/L (ref 96–112)
Creatinine, Ser: 0.51 mg/dL (ref 0.40–1.20)
GFR: 129.64 mL/min (ref >60.00)
Glucose, Bld: 91 mg/dL (ref 70–99)
Potassium: 4.2 mEq/L (ref 3.5–5.1)
Sodium: 138 mEq/L (ref 135–145)
Total Bilirubin: 0.2 mg/dL (ref 0.2–1.2)
Total Protein: 7.1 g/dL (ref 6.0–8.3)

## 2015-05-19 LAB — LIPID PANEL
Cholesterol: 198 mg/dL (ref 0–200)
HDL: 33.6 mg/dL — ABNORMAL LOW (ref >39.00)
NonHDL: 164.09
Total CHOL/HDL Ratio: 6
Triglycerides: 247 mg/dL — ABNORMAL HIGH (ref 0.0–149.0)
VLDL: 49.4 mg/dL — ABNORMAL HIGH (ref 0.0–40.0)

## 2015-05-19 LAB — LDL CHOLESTEROL, DIRECT: Direct LDL: 137 mg/dL

## 2015-05-19 LAB — HEMOGLOBIN A1C: Hgb A1c MFr Bld: 5.8 % (ref 4.6–6.5)

## 2015-05-23 NOTE — Assessment & Plan Note (Signed)
minimize simple carbs. Increase exercise as tolerated.  

## 2015-05-23 NOTE — Assessment & Plan Note (Signed)
Is in need of a new Power wheelchair. She has had and operated one for years. She will require a power wheelchair with a head rest and a chair that reclines. She will also require elevated leg rests and pressure relieving and positioning cushion.  She is both physically and mentally able to operate the wheel chair independently. She spends roughly 12 hours a day in the chair and without this chair would essentially be bedbound. She needs this device helps her to complete her assisted in-home activities of daily living. She is not able to utilize any other mobility device such as a manual wheelchair or scooter due to her incomplete quadriplegia and multiple shoulder surgeries with upper extremity weakness. She struggles with decreased neck strength so she will need the head rest. She will need to elevate her feet frequently due to edema as well.  Finally she will need desk length armrests in order to operate

## 2015-05-23 NOTE — Assessment & Plan Note (Signed)
Encouraged heart healthy diet, increase exercise, avoid trans fats, consider a krill oil cap daily 

## 2015-05-23 NOTE — Progress Notes (Signed)
Subjective:    Patient ID: [name], female    DOB: 1951/03/08, 64 y.o.   MRN: [id number]  Chief Complaint  Patient presents with  . Follow-up    HPI Patient is in today for follow-up. Her bronchitis is improving. She has some congestion but nothing significant at this time. No fevers or chills. She is in need of a new power wheelchair. She spends roughly 12 hours a day in her care and operates it well. Without it she would be unable to perform her activities of daily even with assistance. No other recent illness. No acute complaints. Denies CP/palp/SOB/HA/congestion/fevers/GI or GU c/o. Taking meds as prescribed  Past Medical History  Diagnosis Date  . Hyperlipidemia   . Hypertension   . Quadriplegia ([place]) 1981    Secondary to ruptured AVM C5-C6  . Neurogenic bladder   . Bowel dysfunction     constipation and fecal retention  . History of kidney stones   . Obesity   . Sacral pressure ulcer 12/01/2012    H/o Follows with Dr Tomi Likens of Dermatology  . Pain in joint, shoulder region 12/01/2012    Long history B/l Follows with Dr Hassell Done at [facility]  . Mitral valve disorder 12/01/2012  . Postmenopausal bleeding 12/17/2012  . Vaginitis and vulvovaginitis 12/17/2012  . Arthritis   . Preventative health care 06/26/2013  . Contusion 05/18/2014    Of foot  . Medicare annual wellness visit, subsequent 11/02/2014    Follows with [facility] Declines colonoscopy  No h/o abnormal pap, declines further paps  . Acute bronchitis 05/16/2015  . Incomplete quadriplegia at C5-6 level ([place]) 07/24/2008    Qualifier: Diagnosis of  By: Wynona Luna Requires bowel regimen including enemas and indwelling foley cath Has had tendency towards pressure sores in past. Had a tear with a transfer in Hospital in past for surgery and developed a sore at that time.   . Hyperglycemia 05/18/2015    Past Surgical History  Procedure Laterality Date  . Laminectomy  1981    AVM  . Transfer  tendon hand  1982, 1983    to right hand  . Rotator cuff repair  1996, 1998    left shoulder  . Total shoulder replacement  2001    left  . Shoulder arthroscopy  2008    right  . Ventral hernia repair  08/17/2010    Postop ileus (Dr. Redmond Pulling)  . Hernia repair  0000000    umbilical hernia repair    Family History  Problem Relation Age of Onset  . Cancer Mother     breast & melanoma  . Hypertension Mother   . Heart disease Father     CAD, MI, AFib  . Cancer Sister     melanoma  . Cancer Maternal Aunt     breast  . Cancer Paternal Aunt     breast  . Cancer Paternal Uncle     breast  . Cancer Paternal Aunt     breast  . Cancer Maternal Grandfather     melanoma  . Stroke Paternal Grandmother   . Heart disease Paternal Grandfather     Social History   Social History  . Marital Status: Single    Spouse Name: N/A  . Number of Children: N/A  . Years of Education: N/A   Occupational History  . Disabled     math teacher   Social History Main Topics  . Smoking status: Never Smoker   .  Smokeless tobacco: Never Used  . Alcohol Use: No  . Drug Use: Not on file  . Sexual Activity: Not on file     Comment: lives with sister and mother, no major dietary restrictions   Other Topics Concern  . Not on file   Social History Narrative   Sister is primary caregiver    Outpatient Prescriptions Prior to Visit  Medication Sig Dispense Refill  . baclofen (LIORESAL) 20 MG tablet Take 1/2 to 1 tablet by mouth three times a day. 90 each 6  . diazepam (VALIUM) 5 MG tablet Take 1 tablet (5 mg total) by mouth daily as needed for anxiety. 1/2 tablet by mouth at bedtime as needed. 30 tablet 2  . HYDROcodone-acetaminophen (NORCO) 5-325 MG per tablet Take 1 tablet by mouth every 6 (six) hours as needed. 40 tablet 0  . Multiple Vitamins-Calcium (ONE-A-DAY WOMENS FORMULA PO) Take 1 tablet by mouth daily.      Marland Kitchen oxybutynin (DITROPAN) 5 MG tablet Take 5 mg by mouth every morning.      .  triamcinolone cream (KENALOG) 0.1 % Apply 1 application topically 3 (three) times daily. 60 g 2  . amoxicillin (AMOXIL) 500 MG capsule Take 1 capsule (500 mg total) by mouth 3 (three) times daily. 21 capsule 0  . furosemide (LASIX) 20 MG tablet Take 1 tablet (20 mg total) by mouth 2 (two) times daily. 180 tablet 2  . potassium chloride SA (K-DUR,KLOR-CON) 20 MEQ tablet Take 2 tablets by mouth two times a day. 360 tablet 0   No facility-administered medications prior to visit.    Allergies  Allergen Reactions  . Adhesive [Tape] Other (See Comments)    Skin Irritant.    Review of Systems  Constitutional: Negative for fever and malaise/fatigue.  HENT: Positive for congestion.   Eyes: Negative for discharge.  Respiratory: Negative for shortness of breath.   Cardiovascular: Negative for chest pain, palpitations and leg swelling.  Gastrointestinal: Negative for nausea and abdominal pain.  Genitourinary: Negative for dysuria.  Musculoskeletal: Negative for falls.  Skin: Negative for rash.  Neurological: Negative for loss of consciousness and headaches.  Endo/Heme/Allergies: Negative for environmental allergies.  Psychiatric/Behavioral: Negative for depression. The patient is not nervous/anxious.        Objective:    Physical Exam  Constitutional: She is oriented to person, place, and time. She appears well-developed and well-nourished. No distress.  HENT:  Head: Normocephalic and atraumatic.  Nose: Nose normal.  Eyes: Right eye exhibits no discharge. Left eye exhibits no discharge.  Neck: Normal range of motion. Neck supple.  Cardiovascular: Normal rate and regular rhythm.   No murmur heard. Pulmonary/Chest: Effort normal and breath sounds normal.  Abdominal: Soft. Bowel sounds are normal. There is no tenderness.  Musculoskeletal: She exhibits no edema.  Neurological: She is alert and oriented to person, place, and time.  Patient without use of legs and very minimal use of arms.  Able to manipulate her wheelchair with her right hand  Skin: Skin is warm and dry.  Psychiatric: She has a normal mood and affect.  Nursing note and vitals reviewed.   BP 128/86 mmHg  Pulse 81  Temp(Src) 98.7 F (37.1 C) (Oral)  Wt 220 lb (99.791 kg)  SpO2 96% Wt Readings from Last 3 Encounters:  05/18/15 220 lb (99.791 kg)  10/21/14 220 lb (99.791 kg)     Lab Results  Component Value Date   WBC 9.0 05/18/2015   HGB 13.4 05/18/2015   HCT  41.1 05/18/2015   PLT 284.0 05/18/2015   GLUCOSE 91 05/18/2015   CHOL 198 05/18/2015   TRIG 247.0* 05/18/2015   HDL 33.60* 05/18/2015   LDLDIRECT 137.0 05/18/2015   LDLCALC 113* 09/23/2013   ALT 15 05/18/2015   AST 14 05/18/2015   NA 138 05/18/2015   K 4.2 05/18/2015   CL 102 05/18/2015   CREATININE 0.51 05/18/2015   BUN 12 05/18/2015   CO2 28 05/18/2015   TSH 2.69 05/18/2015   HGBA1C 5.8 05/18/2015    Lab Results  Component Value Date   TSH 2.69 05/18/2015   Lab Results  Component Value Date   WBC 9.0 05/18/2015   HGB 13.4 05/18/2015   HCT 41.1 05/18/2015   MCV 81.9 05/18/2015   PLT 284.0 05/18/2015   Lab Results  Component Value Date   NA 138 05/18/2015   K 4.2 05/18/2015   CO2 28 05/18/2015   GLUCOSE 91 05/18/2015   BUN 12 05/18/2015   CREATININE 0.51 05/18/2015   BILITOT 0.2 05/18/2015   ALKPHOS 98 05/18/2015   AST 14 05/18/2015   ALT 15 05/18/2015   PROT 7.1 05/18/2015   ALBUMIN 3.6 05/18/2015   CALCIUM 9.3 05/18/2015   GFR 129.64 05/18/2015   Lab Results  Component Value Date   CHOL 198 05/18/2015   Lab Results  Component Value Date   HDL 33.60* 05/18/2015   Lab Results  Component Value Date   LDLCALC 113* 09/23/2013   Lab Results  Component Value Date   TRIG 247.0* 05/18/2015   Lab Results  Component Value Date   CHOLHDL 6 05/18/2015   Lab Results  Component Value Date   HGBA1C 5.8 05/18/2015       Assessment & Plan:   Problem List Items Addressed This Visit    Acute bronchitis     Improving with treatment. Finish course of treatment      Anemia   Relevant Orders   CBC (Completed)   TSH (Completed)   Lipid panel (Completed)   Comprehensive metabolic panel (Completed)   Hemoglobin A1c (Completed)   Hepatitis C Antibody (Completed)   Essential hypertension   Relevant Medications   furosemide (LASIX) 20 MG tablet   Other Relevant Orders   CBC (Completed)   TSH (Completed)   Lipid panel (Completed)   Comprehensive metabolic panel (Completed)   Hemoglobin A1c (Completed)   Hepatitis C Antibody (Completed)   Hyperglycemia    minimize simple carbs. Increase exercise as tolerated.       Relevant Orders   CBC (Completed)   TSH (Completed)   Lipid panel (Completed)   Comprehensive metabolic panel (Completed)   Hemoglobin A1c (Completed)   Hepatitis C Antibody (Completed)   Hyperlipidemia    Encouraged heart healthy diet, increase exercise, avoid trans fats, consider a krill oil cap daily      Relevant Medications   furosemide (LASIX) 20 MG tablet   Other Relevant Orders   CBC (Completed)   TSH (Completed)   Lipid panel (Completed)   Comprehensive metabolic panel (Completed)   Hemoglobin A1c (Completed)   Hepatitis C Antibody (Completed)   Incomplete quadriplegia at C5-6 level [facility])    Is in need of a new Power wheelchair. She has had and operated one for years. She will require a power wheelchair with a head rest and a chair that reclines. She will also require elevated leg rests and pressure relieving and positioning cushion.  She is both physically and mentally able to operate the  wheel chair independently. She spends roughly 12 hours a day in the chair and without this chair would essentially be bedbound. She needs this device helps her to complete her assisted in-home activities of daily living. She is not able to utilize any other mobility device such as a manual wheelchair or scooter due to her incomplete quadriplegia and multiple shoulder  surgeries with upper extremity weakness. She struggles with decreased neck strength so she will need the head rest. She will need to elevate her feet frequently due to edema as well.  Finally she will need desk length armrests in order to operate       Other Visit Diagnoses    Screening for viral disease    -  Primary    Relevant Orders    CBC (Completed)    TSH (Completed)    Lipid panel (Completed)    Comprehensive metabolic panel (Completed)    Hemoglobin A1c (Completed)    Hepatitis C Antibody (Completed)    Encounter for immunization           I am having Ms. Fochtman maintain her Multiple Vitamins-Calcium (ONE-A-DAY WOMENS FORMULA PO), oxybutynin, triamcinolone cream, baclofen, diazepam, HYDROcodone-acetaminophen, furosemide, potassium chloride SA, and amoxicillin.  Meds ordered this encounter  Medications  . furosemide (LASIX) 20 MG tablet    Sig: Take 1 tablet (20 mg total) by mouth 2 (two) times daily.    Dispense:  180 tablet    Refill:  2  . potassium chloride SA (K-DUR,KLOR-CON) 20 MEQ tablet    Sig: Take 2 tablets by mouth two times a day.    Dispense:  360 tablet    Refill:  0  . amoxicillin (AMOXIL) 500 MG capsule    Sig: Take 1 capsule (500 mg total) by mouth 3 (three) times daily.    Dispense:  21 capsule    Refill:  0     Penni Homans, MD

## 2015-05-23 NOTE — Assessment & Plan Note (Signed)
Improving with treatment. Finish course of treatment

## 2015-05-24 DIAGNOSIS — Z1211 Encounter for screening for malignant neoplasm of colon: Secondary | ICD-10-CM | POA: Diagnosis not present

## 2015-05-24 DIAGNOSIS — Z1212 Encounter for screening for malignant neoplasm of rectum: Secondary | ICD-10-CM | POA: Diagnosis not present

## 2015-05-24 LAB — COLOGUARD: Cologuard: NEGATIVE

## 2015-06-08 ENCOUNTER — Encounter: Payer: Self-pay | Admitting: Family Medicine

## 2015-06-09 ENCOUNTER — Telehealth: Payer: Self-pay | Admitting: Family Medicine

## 2015-06-09 ENCOUNTER — Encounter: Payer: Self-pay | Admitting: Family Medicine

## 2015-06-09 NOTE — Telephone Encounter (Signed)
Received Cologuard result and was negative. Abstracted result under cologuard. Called the patient informed of result, mailed a copy to the patient and original sent to scan.

## 2015-06-29 ENCOUNTER — Telehealth: Payer: Self-pay | Admitting: *Deleted

## 2015-06-29 NOTE — Telephone Encounter (Signed)
Received paperwork from [employer] for Power Wheelchair order; attached is therapist's evaluation, seven element written order, [place] [employer] request for Prior Approval [all to be signed by PCP], attached master copy of face-to-face evaluation/OV, forwarded to provider/SLS

## 2015-06-30 NOTE — Telephone Encounter (Signed)
Completed paperwork faxed to [employer], sent to scan/SLS

## 2015-07-03 ENCOUNTER — Telehealth: Payer: Self-pay | Admitting: Family Medicine

## 2015-07-03 NOTE — Telephone Encounter (Signed)
Do not believe I have seen any form please track down if possible

## 2015-07-03 NOTE — Telephone Encounter (Signed)
Caller name: Dietrich Pates  Can be reached: [phone]   Reason for call: One item on the written orders need to be corrected.  Faxing the form so that you will have it prior to calling back. Plse call for more details

## 2015-07-06 NOTE — Telephone Encounter (Signed)
Received fax with request of correction to #2 on the 7 Element Written Order and instructions to call Brooke at [phone] Ext. [phone]; [facility] with contact name and number for return call RE: information needed for correction, awaiting return call/SLS

## 2015-07-06 NOTE — Telephone Encounter (Signed)
done

## 2015-07-06 NOTE — Telephone Encounter (Signed)
Made changes per Brooke's instructions, needed change of date with Initials from provider, done and faxed back to Brooke/SLS 01/23

## 2015-08-10 ENCOUNTER — Telehealth: Payer: Self-pay | Admitting: *Deleted

## 2015-08-10 NOTE — Telephone Encounter (Signed)
Received paperwork from [facility] for Power Wheelchair order; changes made to Original, [facility] request for Prior Approval [to be signed by PCP], forwarded to provider; signed and faxed back to [phone]/SLS 02/27

## 2015-09-28 ENCOUNTER — Encounter: Payer: Self-pay | Admitting: Family Medicine

## 2015-09-28 ENCOUNTER — Ambulatory Visit (INDEPENDENT_AMBULATORY_CARE_PROVIDER_SITE_OTHER): Payer: Medicare Other | Admitting: Family Medicine

## 2015-09-28 VITALS — BP 130/68 | HR 90 | Temp 98.2°F | Ht 72.0 in | Wt 220.0 lb

## 2015-09-28 DIAGNOSIS — N390 Urinary tract infection, site not specified: Secondary | ICD-10-CM | POA: Diagnosis not present

## 2015-09-28 DIAGNOSIS — N319 Neuromuscular dysfunction of bladder, unspecified: Secondary | ICD-10-CM

## 2015-09-28 DIAGNOSIS — E782 Mixed hyperlipidemia: Secondary | ICD-10-CM | POA: Diagnosis not present

## 2015-09-28 DIAGNOSIS — G4710 Hypersomnia, unspecified: Secondary | ICD-10-CM | POA: Diagnosis not present

## 2015-09-28 DIAGNOSIS — I10 Essential (primary) hypertension: Secondary | ICD-10-CM

## 2015-09-28 DIAGNOSIS — H6121 Impacted cerumen, right ear: Secondary | ICD-10-CM

## 2015-09-28 DIAGNOSIS — R0683 Snoring: Secondary | ICD-10-CM

## 2015-09-28 DIAGNOSIS — R519 Headache, unspecified: Secondary | ICD-10-CM

## 2015-09-28 DIAGNOSIS — E785 Hyperlipidemia, unspecified: Secondary | ICD-10-CM

## 2015-09-28 DIAGNOSIS — R0681 Apnea, not elsewhere classified: Secondary | ICD-10-CM

## 2015-09-28 DIAGNOSIS — R51 Headache: Secondary | ICD-10-CM

## 2015-09-28 NOTE — Progress Notes (Signed)
Subjective:    Patient ID: [name], female    DOB: January 08, 1951, 65 y.o.   MRN: [id number]  Chief Complaint  Patient presents with  . Follow-up    HPI Patient is in today for follow up. She is endorsing increased fatigue, also notes some increased HA and neck pain. Notes feeling tired no matter how much sleep she gets. Notes some trouble with rosacea acting up on right cheek intermittently. Denies CP/palp/SOB/congestion/fevers/GI or GU c/o. Taking meds as prescribed  Past Medical History  Diagnosis Date  . Hyperlipidemia   . Hypertension   . Quadriplegia ([place]) 1981    Secondary to ruptured AVM C5-C6  . Neurogenic bladder   . Bowel dysfunction     constipation and fecal retention  . History of kidney stones   . Obesity   . Sacral pressure ulcer 12/01/2012    H/o Follows with Dr Tomi Likens of Dermatology  . Pain in joint, shoulder region 12/01/2012    Long history B/l Follows with Dr Hassell [facility] at [facility]  . Mitral valve disorder 12/01/2012  . Postmenopausal bleeding 12/17/2012  . Vaginitis and vulvovaginitis 12/17/2012  . Arthritis   . Preventative health care 06/26/2013  . Contusion 05/18/2014    Of foot  . Medicare annual wellness visit, subsequent 11/02/2014    Follows with [facility] Declines colonoscopy  No h/o abnormal pap, declines further paps  . Acute bronchitis 05/16/2015  . Incomplete quadriplegia at C5-6 level ([place]) 07/24/2008    Qualifier: Diagnosis of  By: Wynona Luna Requires bowel regimen including enemas and indwelling foley cath Has had tendency towards pressure sores in past. Had a tear with a transfer in Hospital in past for surgery and developed a sore at that time.   . Hyperglycemia 05/18/2015  . Cerumen impaction 10/11/2015    Past Surgical History  Procedure Laterality Date  . Laminectomy  1981    AVM  . Transfer tendon hand  1982, 1983    to right hand  . Rotator cuff repair  1996, 1998    left shoulder  . Total shoulder  replacement  2001    left  . Shoulder arthroscopy  2008    right  . Ventral hernia repair  08/17/2010    Postop ileus (Dr. Redmond Pulling)  . Hernia repair  0000000    umbilical hernia repair    Family History  Problem Relation Age of Onset  . Cancer Mother     breast & melanoma  . Hypertension Mother   . Heart disease Father     CAD, MI, AFib  . Cancer Sister     melanoma  . Cancer Maternal Aunt     breast  . Cancer Paternal Aunt     breast  . Cancer Paternal Uncle     breast  . Cancer Paternal Aunt     breast  . Cancer Maternal Grandfather     melanoma  . Stroke Paternal Grandmother   . Heart disease Paternal Grandfather     Social History   Social History  . Marital Status: Single    Spouse Name: N/A  . Number of Children: N/A  . Years of Education: N/A   Occupational History  . Disabled     math teacher   Social History Main Topics  . Smoking status: Never Smoker   . Smokeless tobacco: Never Used  . Alcohol Use: No  . Drug Use: Not on file  . Sexual Activity:  Not on file     Comment: lives with sister and mother, no major dietary restrictions   Other Topics Concern  . Not on file   Social History Narrative   Sister is primary caregiver    Outpatient Prescriptions Prior to Visit  Medication Sig Dispense Refill  . baclofen (LIORESAL) 20 MG tablet Take 1/2 to 1 tablet by mouth three times a day. 90 each 6  . diazepam (VALIUM) 5 MG tablet Take 1 tablet (5 mg total) by mouth daily as needed for anxiety. 1/2 tablet by mouth at bedtime as needed. 30 tablet 2  . furosemide (LASIX) 20 MG tablet Take 1 tablet (20 mg total) by mouth 2 (two) times daily. 180 tablet 2  . HYDROcodone-acetaminophen (NORCO) 5-325 MG per tablet Take 1 tablet by mouth every 6 (six) hours as needed. 40 tablet 0  . Multiple Vitamins-Calcium (ONE-A-DAY WOMENS FORMULA PO) Take 1 tablet by mouth daily.      Marland Kitchen oxybutynin (DITROPAN) 5 MG tablet Take 5 mg by mouth every morning.      .  triamcinolone cream (KENALOG) 0.1 % Apply 1 application topically 3 (three) times daily. 60 g 2  . amoxicillin (AMOXIL) 500 MG capsule Take 1 capsule (500 mg total) by mouth 3 (three) times daily. 21 capsule 0  . potassium chloride SA (K-DUR,KLOR-CON) 20 MEQ tablet Take 2 tablets by mouth two times a day. 360 tablet 0   No facility-administered medications prior to visit.    Allergies  Allergen Reactions  . Adhesive [Tape] Other (See Comments)    Skin Irritant.    Review of Systems  Constitutional: Positive for malaise/fatigue. Negative for fever.  HENT: Negative for congestion.   Eyes: Negative for blurred vision.  Respiratory: Negative for shortness of breath.   Cardiovascular: Negative for chest pain, palpitations and leg swelling.  Gastrointestinal: Negative for nausea, abdominal pain and blood in stool.  Genitourinary: Negative for dysuria and frequency.  Musculoskeletal: Negative for falls.  Skin: Negative for rash.  Neurological: Positive for focal weakness and headaches. Negative for dizziness and loss of consciousness.  Endo/Heme/Allergies: Negative for environmental allergies.  Psychiatric/Behavioral: Negative for depression. The patient is not nervous/anxious.        Objective:    Physical Exam  Constitutional: She is oriented to person, place, and time. She appears well-developed and well-nourished. No distress.  HENT:  Head: Normocephalic and atraumatic.  Nose: Nose normal.  Eyes: Right eye exhibits no discharge. Left eye exhibits no discharge.  Neck: Normal range of motion. Neck supple.  Cardiovascular: Normal rate and regular rhythm.   No murmur heard. Pulmonary/Chest: Effort normal and breath sounds normal.  Abdominal: Soft. Bowel sounds are normal. There is no tenderness.  Musculoskeletal: She exhibits no edema.  In wheelchair  Neurological: She is alert and oriented to person, place, and time.  Skin: Skin is warm and dry.  Psychiatric: She has a normal  mood and affect.  Nursing note and vitals reviewed.   BP 130/68 mmHg  Pulse 90  Temp(Src) 98.2 F (36.8 C) (Oral)  Ht 6' (1.829 m)  Wt 220 lb (99.791 kg)  BMI 29.83 kg/m2  SpO2 98% Wt Readings from Last 3 Encounters:  09/28/15 220 lb (99.791 kg)  05/18/15 220 lb (99.791 kg)  10/21/14 220 lb (99.791 kg)     Lab Results  Component Value Date   WBC 6.8 09/28/2015   HGB 13.4 09/28/2015   HCT 39.5 09/28/2015   PLT 236.0 09/28/2015   GLUCOSE  107* 09/28/2015   CHOL 201* 09/28/2015   TRIG 267.0* 09/28/2015   HDL 34.60* 09/28/2015   LDLDIRECT 139.0 09/28/2015   LDLCALC 113* 09/23/2013   ALT 21 09/28/2015   AST 16 09/28/2015   NA 138 09/28/2015   K 4.4 09/28/2015   CL 101 09/28/2015   CREATININE 0.52 09/28/2015   BUN 11 09/28/2015   CO2 32 09/28/2015   TSH 2.69 05/18/2015   HGBA1C 5.8 05/18/2015    Lab Results  Component Value Date   TSH 2.69 05/18/2015   Lab Results  Component Value Date   WBC 6.8 09/28/2015   HGB 13.4 09/28/2015   HCT 39.5 09/28/2015   MCV 80.3 09/28/2015   PLT 236.0 09/28/2015   Lab Results  Component Value Date   NA 138 09/28/2015   K 4.4 09/28/2015   CO2 32 09/28/2015   GLUCOSE 107* 09/28/2015   BUN 11 09/28/2015   CREATININE 0.52 09/28/2015   BILITOT 0.3 09/28/2015   ALKPHOS 86 09/28/2015   AST 16 09/28/2015   ALT 21 09/28/2015   PROT 7.1 09/28/2015   ALBUMIN 3.9 09/28/2015   CALCIUM 9.5 09/28/2015   GFR 126.62 09/28/2015   Lab Results  Component Value Date   CHOL 201* 09/28/2015   Lab Results  Component Value Date   HDL 34.60* 09/28/2015   Lab Results  Component Value Date   LDLCALC 113* 09/23/2013   Lab Results  Component Value Date   TRIG 267.0* 09/28/2015   Lab Results  Component Value Date   CHOLHDL 6 09/28/2015   Lab Results  Component Value Date   HGBA1C 5.8 05/18/2015       Assessment & Plan:   Problem List Items Addressed This Visit    Apnea    Home sleep study confirms apnea, order titration.         Relevant Orders   Home sleep test (Completed)   Cerumen impaction    3-5 drops of H2O2 to ears qhs prn      Essential hypertension    Well controlled, no changes to meds. Encouraged heart healthy diet such as the DASH diet and exercise as tolerated.       Relevant Orders   Urine culture (Completed)   Urinalysis   Lipid panel (Completed)   Comprehensive metabolic panel (Completed)   CBC (Completed)   Home sleep test (Completed)   Hyperlipidemia    Encouraged heart healthy diet, increase exercise, avoid trans fats, consider a krill oil cap daily      Neurogenic bladder    Urine culture contaminated. Not obviously infected.        Other Visit Diagnoses    Hyperlipidemia, mixed    -  Primary    Relevant Orders    Urine culture (Completed)    Urinalysis    Lipid panel (Completed)    Comprehensive metabolic panel (Completed)    CBC (Completed)    Home sleep test (Completed)    Urinary tract infection without hematuria, site unspecified        Relevant Orders    Urine culture (Completed)    Urinalysis    Lipid panel (Completed)    Comprehensive metabolic panel (Completed)    CBC (Completed)    Home sleep test (Completed)    Hypersomnolence        Relevant Orders    Home sleep test (Completed)    Snoring        Relevant Orders    Home sleep test (Completed)  Morbid obesity, unspecified obesity type ([place])        Relevant Orders    Home sleep test (Completed)    Nonintractable episodic headache, unspecified headache type        Relevant Orders    Home sleep test (Completed)       I have discontinued Ms. Dols's amoxicillin. I am also having her maintain her Multiple Vitamins-Calcium (ONE-A-DAY WOMENS FORMULA PO), oxybutynin, triamcinolone cream, baclofen, diazepam, HYDROcodone-acetaminophen, furosemide, and clobetasol.  Meds ordered this encounter  Medications  . clobetasol (TEMOVATE) 0.05 % external solution    Sig: Apply topically.     Penni Homans, MD

## 2015-09-28 NOTE — Patient Instructions (Signed)
Hypertension Hypertension, commonly called high blood pressure, is when the force of blood pumping through your arteries is too strong. Your arteries are the blood vessels that carry blood from your heart throughout your body. A blood pressure reading consists of a higher number over a lower number, such as 110/72. The higher number (systolic) is the pressure inside your arteries when your heart pumps. The lower number (diastolic) is the pressure inside your arteries when your heart relaxes. Ideally you want your blood pressure below 120/80. Hypertension forces your heart to work harder to pump blood. Your arteries may become narrow or stiff. Having untreated or uncontrolled hypertension can cause heart attack, stroke, kidney disease, and other problems. RISK FACTORS Some risk factors for high blood pressure are controllable. Others are not.  Risk factors you cannot control include:   Race. You may be at higher risk if you are African American.  Age. Risk increases with age.  Gender. Men are at higher risk than women before age 45 years. After age 65, women are at higher risk than men. Risk factors you can control include:  Not getting enough exercise or physical activity.  Being overweight.  Getting too much fat, sugar, calories, or salt in your diet.  Drinking too much alcohol. SIGNS AND SYMPTOMS Hypertension does not usually cause signs or symptoms. Extremely high blood pressure (hypertensive crisis) may cause headache, anxiety, shortness of breath, and nosebleed. DIAGNOSIS To check if you have hypertension, your health care provider will measure your blood pressure while you are seated, with your arm held at the level of your heart. It should be measured at least twice using the same arm. Certain conditions can cause a difference in blood pressure between your right and left arms. A blood pressure reading that is higher than normal on one occasion does not mean that you need treatment. If  it is not clear whether you have high blood pressure, you may be asked to return on a different day to have your blood pressure checked again. Or, you may be asked to monitor your blood pressure at home for 1 or more weeks. TREATMENT Treating high blood pressure includes making lifestyle changes and possibly taking medicine. Living a healthy lifestyle can help lower high blood pressure. You may need to change some of your habits. Lifestyle changes may include:  Following the DASH diet. This diet is high in fruits, vegetables, and whole grains. It is low in salt, red meat, and added sugars.  Keep your sodium intake below 2,300 mg per day.  Getting at least 30-45 minutes of aerobic exercise at least 4 times per week.  Losing weight if necessary.  Not smoking.  Limiting alcoholic beverages.  Learning ways to reduce stress. Your health care provider may prescribe medicine if lifestyle changes are not enough to get your blood pressure under control, and if one of the following is true:  You are 18-59 years of age and your systolic blood pressure is above 140.  You are 60 years of age or older, and your systolic blood pressure is above 150.  Your diastolic blood pressure is above 90.  You have diabetes, and your systolic blood pressure is over 140 or your diastolic blood pressure is over 90.  You have kidney disease and your blood pressure is above 140/90.  You have heart disease and your blood pressure is above 140/90. Your personal target blood pressure may vary depending on your medical conditions, your age, and other factors. HOME CARE INSTRUCTIONS    Have your blood pressure rechecked as directed by your health care provider.   Take medicines only as directed by your health care provider. Follow the directions carefully. Blood pressure medicines must be taken as prescribed. The medicine does not work as well when you skip doses. Skipping doses also puts you at risk for  problems.  Do not smoke.   Monitor your blood pressure at home as directed by your health care provider. SEEK MEDICAL CARE IF:   You think you are having a reaction to medicines taken.  You have recurrent headaches or feel dizzy.  You have swelling in your ankles.  You have trouble with your vision. SEEK IMMEDIATE MEDICAL CARE IF:  You develop a severe headache or confusion.  You have unusual weakness, numbness, or feel faint.  You have severe chest or abdominal pain.  You vomit repeatedly.  You have trouble breathing. MAKE SURE YOU:   Understand these instructions.  Will watch your condition.  Will get help right away if you are not doing well or get worse.   This information is not intended to replace advice given to you by your health care provider. Make sure you discuss any questions you have with your health care provider.   Document Released: 05/30/2005 Document Revised: 10/14/2014 Document Reviewed: 03/22/2013 Elsevier Interactive Patient Education 2016 Elsevier Inc.  

## 2015-09-29 LAB — COMPREHENSIVE METABOLIC PANEL
ALT: 21 U/L (ref 0–35)
AST: 16 U/L (ref 0–37)
Albumin: 3.9 g/dL (ref 3.5–5.2)
Alkaline Phosphatase: 86 U/L (ref 39–117)
BUN: 11 mg/dL (ref 6–23)
CO2: 32 mEq/L (ref 19–32)
Calcium: 9.5 mg/dL (ref 8.4–10.5)
Chloride: 101 mEq/L (ref 96–112)
Creatinine, Ser: 0.52 mg/dL (ref 0.40–1.20)
GFR: 126.62 mL/min (ref >60.00)
Glucose, Bld: 107 mg/dL — ABNORMAL HIGH (ref 70–99)
Potassium: 4.4 mEq/L (ref 3.5–5.1)
Sodium: 138 mEq/L (ref 135–145)
Total Bilirubin: 0.3 mg/dL (ref 0.2–1.2)
Total Protein: 7.1 g/dL (ref 6.0–8.3)

## 2015-09-29 LAB — LIPID PANEL
Cholesterol: 201 mg/dL — ABNORMAL HIGH (ref 0–200)
HDL: 34.6 mg/dL — ABNORMAL LOW (ref >39.00)
NonHDL: 166.12
Total CHOL/HDL Ratio: 6
Triglycerides: 267 mg/dL — ABNORMAL HIGH (ref 0.0–149.0)
VLDL: 53.4 mg/dL — ABNORMAL HIGH (ref 0.0–40.0)

## 2015-09-29 LAB — CBC
HCT: 39.5 % (ref 36.0–46.0)
Hemoglobin: 13.4 g/dL (ref 12.0–15.0)
MCHC: 33.9 g/dL (ref 30.0–36.0)
MCV: 80.3 fl (ref 78.0–100.0)
Platelets: 236 10*3/uL (ref 150.0–400.0)
RBC: 4.93 Mil/uL (ref 3.87–5.11)
RDW: 14.8 % (ref 11.5–15.5)
WBC: 6.8 10*3/uL (ref 4.0–10.5)

## 2015-09-29 LAB — LDL CHOLESTEROL, DIRECT: Direct LDL: 139 mg/dL

## 2015-09-30 LAB — URINALYSIS, ROUTINE W REFLEX MICROSCOPIC
Bilirubin Urine: NEGATIVE
Ketones, ur: NEGATIVE
Nitrite: POSITIVE — AB
RBC / HPF: NONE SEEN (ref 0–?)
Specific Gravity, Urine: 1.01 (ref 1.000–1.030)
Total Protein, Urine: NEGATIVE
Urine Glucose: NEGATIVE
Urobilinogen, UA: 0.2 (ref 0.0–1.0)
pH: 8.5 — AB (ref 5.0–8.0)

## 2015-09-30 LAB — URINE CULTURE: Colony Count: >=100000

## 2015-10-06 DIAGNOSIS — G4733 Obstructive sleep apnea (adult) (pediatric): Secondary | ICD-10-CM

## 2015-10-07 DIAGNOSIS — G4733 Obstructive sleep apnea (adult) (pediatric): Secondary | ICD-10-CM

## 2015-10-08 ENCOUNTER — Other Ambulatory Visit: Payer: Self-pay | Admitting: *Deleted

## 2015-10-08 DIAGNOSIS — R519 Headache, unspecified: Secondary | ICD-10-CM

## 2015-10-08 DIAGNOSIS — N390 Urinary tract infection, site not specified: Secondary | ICD-10-CM

## 2015-10-08 DIAGNOSIS — I10 Essential (primary) hypertension: Secondary | ICD-10-CM

## 2015-10-08 DIAGNOSIS — R0683 Snoring: Secondary | ICD-10-CM

## 2015-10-08 DIAGNOSIS — G4710 Hypersomnia, unspecified: Secondary | ICD-10-CM

## 2015-10-08 DIAGNOSIS — E782 Mixed hyperlipidemia: Secondary | ICD-10-CM

## 2015-10-08 DIAGNOSIS — R0681 Apnea, not elsewhere classified: Secondary | ICD-10-CM

## 2015-10-08 DIAGNOSIS — R51 Headache: Secondary | ICD-10-CM

## 2015-10-09 ENCOUNTER — Other Ambulatory Visit: Payer: Self-pay | Admitting: Family Medicine

## 2015-10-09 DIAGNOSIS — G4730 Sleep apnea, unspecified: Principal | ICD-10-CM

## 2015-10-09 DIAGNOSIS — G4710 Hypersomnia, unspecified: Secondary | ICD-10-CM

## 2015-10-09 MED ORDER — POTASSIUM CHLORIDE CRYS ER 20 MEQ PO TBCR: EXTENDED_RELEASE_TABLET | ORAL | Status: DC

## 2015-10-11 ENCOUNTER — Encounter: Payer: Self-pay | Admitting: Family Medicine

## 2015-10-11 DIAGNOSIS — R0681 Apnea, not elsewhere classified: Secondary | ICD-10-CM | POA: Insufficient documentation

## 2015-10-11 DIAGNOSIS — H6120 Impacted cerumen, unspecified ear: Secondary | ICD-10-CM

## 2015-10-11 HISTORY — DX: Impacted cerumen, unspecified ear: H61.20

## 2015-10-11 NOTE — Assessment & Plan Note (Signed)
Home sleep study confirms apnea, order titration.

## 2015-10-11 NOTE — Assessment & Plan Note (Signed)
3-5 drops of H2O2 to ears qhs prn

## 2015-10-11 NOTE — Assessment & Plan Note (Signed)
Urine culture contaminated. Not obviously infected.

## 2015-10-11 NOTE — Assessment & Plan Note (Signed)
Encouraged heart healthy diet, increase exercise, avoid trans fats, consider a krill oil cap daily 

## 2015-10-11 NOTE — Assessment & Plan Note (Signed)
Well controlled, no changes to meds. Encouraged heart healthy diet such as the DASH diet and exercise as tolerated.  °

## 2015-10-23 ENCOUNTER — Encounter: Payer: Self-pay | Admitting: Family Medicine

## 2015-10-26 ENCOUNTER — Telehealth: Payer: Self-pay | Admitting: Family Medicine

## 2015-10-26 NOTE — Telephone Encounter (Signed)
I called [employer] to ask if they did this.  She stated they do not and they know of no one who does this at home.

## 2015-10-26 NOTE — Telephone Encounter (Signed)
Somebody does autotitration at home, see if [facility] or community care does it.

## 2015-10-26 NOTE — Telephone Encounter (Signed)
I think [name] can run the titration please call them and confirm what we need to do and give order for CPAP titration for dx of OSA. Warn patient we may still need pulmonology input at some point but can start titration process with [facility]

## 2015-10-26 NOTE — Telephone Encounter (Signed)
Can be reached: [phone]  Reason for call: Pt called stating she called [facility] sleep study department. They only do overnight SS for titration. She asked if [facility] can do the titration study at home. Please f/u with pt. She will not be home from 2-4pm today.

## 2015-10-27 NOTE — Telephone Encounter (Signed)
I spoke to a [facility] rep. In our office today.  She said there are no at home titrations, this patient will need to have done on site at [facility]

## 2015-10-27 NOTE — Telephone Encounter (Signed)
[name] at [facility] at [phone].  They are faxing over an order form for [place] to complete to see if they are able to accommodate this patient.

## 2015-10-27 NOTE — Telephone Encounter (Signed)
Perfect let me know when form arrives

## 2015-10-28 ENCOUNTER — Encounter: Payer: Self-pay | Admitting: Family Medicine

## 2015-10-29 ENCOUNTER — Telehealth: Payer: Self-pay | Admitting: Family Medicine

## 2015-10-29 ENCOUNTER — Other Ambulatory Visit: Payer: Self-pay | Admitting: Family Medicine

## 2015-10-29 DIAGNOSIS — G4730 Sleep apnea, unspecified: Secondary | ICD-10-CM

## 2015-10-29 NOTE — Telephone Encounter (Signed)
Have now out order in for patient through [facility] through [facility].  Patient is aware

## 2015-10-29 NOTE — Telephone Encounter (Signed)
v

## 2015-11-12 ENCOUNTER — Other Ambulatory Visit: Payer: Self-pay | Admitting: Family Medicine

## 2015-11-12 MED ORDER — BACLOFEN 20 MG PO TABS: ORAL_TABLET | ORAL | Status: DC

## 2016-01-05 ENCOUNTER — Ambulatory Visit (INDEPENDENT_AMBULATORY_CARE_PROVIDER_SITE_OTHER): Payer: Medicare Other | Admitting: Family

## 2016-01-05 ENCOUNTER — Ambulatory Visit (HOSPITAL_BASED_OUTPATIENT_CLINIC_OR_DEPARTMENT_OTHER)
Admission: RE | Admit: 2016-01-05 | Discharge: 2016-01-05 | Disposition: A | Discharge status: Home / Self Care | Payer: Medicare Other | Source: Ambulatory Visit | Attending: Family | Admitting: Family

## 2016-01-05 ENCOUNTER — Encounter: Payer: Self-pay | Admitting: Family

## 2016-01-05 VITALS — BP 142/90 | HR 62 | Temp 98.1°F | Resp 16 | Ht 72.0 in

## 2016-01-05 DIAGNOSIS — M79671 Pain in right foot: Secondary | ICD-10-CM | POA: Diagnosis present

## 2016-01-05 DIAGNOSIS — M7989 Other specified soft tissue disorders: Secondary | ICD-10-CM | POA: Insufficient documentation

## 2016-01-05 DIAGNOSIS — M79672 Pain in left foot: Secondary | ICD-10-CM | POA: Insufficient documentation

## 2016-01-05 DIAGNOSIS — M8580 Other specified disorders of bone density and structure, unspecified site: Secondary | ICD-10-CM | POA: Diagnosis not present

## 2016-01-05 NOTE — Patient Instructions (Signed)
Please complete your foot x-rays on the first floor. We will contact you with your results.

## 2016-01-05 NOTE — Progress Notes (Signed)
Pre visit review using our clinic review tool, if applicable. No additional management support is needed unless otherwise documented below in the visit note. 
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
## 2016-01-07 ENCOUNTER — Other Ambulatory Visit: Payer: Self-pay | Admitting: Family Medicine

## 2016-01-07 MED ORDER — POTASSIUM CHLORIDE CRYS ER 20 MEQ PO TBCR: EXTENDED_RELEASE_TABLET | ORAL | 0 refills | Status: DC

## 2016-01-18 ENCOUNTER — Encounter: Payer: Self-pay | Admitting: Family Medicine

## 2016-01-18 LAB — BASIC METABOLIC PANEL
Creatinine: 0.4 mg/dL — AB (ref 0.5–1.1)
Glucose: 100 mg/dL

## 2016-01-18 LAB — HEMOGLOBIN A1C: Hemoglobin A1C: 11.8

## 2016-01-19 ENCOUNTER — Other Ambulatory Visit: Payer: Self-pay | Admitting: Family Medicine

## 2016-01-19 DIAGNOSIS — G4733 Obstructive sleep apnea (adult) (pediatric): Secondary | ICD-10-CM

## 2016-01-25 ENCOUNTER — Ambulatory Visit (INDEPENDENT_AMBULATORY_CARE_PROVIDER_SITE_OTHER): Payer: Medicare Other | Admitting: Family Medicine

## 2016-01-25 ENCOUNTER — Encounter: Payer: Self-pay | Admitting: Family Medicine

## 2016-01-25 DIAGNOSIS — E785 Hyperlipidemia, unspecified: Secondary | ICD-10-CM | POA: Diagnosis not present

## 2016-01-25 DIAGNOSIS — G4733 Obstructive sleep apnea (adult) (pediatric): Secondary | ICD-10-CM

## 2016-01-25 DIAGNOSIS — R739 Hyperglycemia, unspecified: Secondary | ICD-10-CM

## 2016-01-25 DIAGNOSIS — K5909 Other constipation: Secondary | ICD-10-CM | POA: Diagnosis not present

## 2016-01-25 DIAGNOSIS — I10 Essential (primary) hypertension: Secondary | ICD-10-CM | POA: Diagnosis not present

## 2016-01-25 DIAGNOSIS — K5900 Constipation, unspecified: Secondary | ICD-10-CM

## 2016-01-25 HISTORY — DX: Constipation, unspecified: K59.00

## 2016-01-25 LAB — COMPREHENSIVE METABOLIC PANEL
ALT: 17 U/L (ref 0–35)
AST: 14 U/L (ref 0–37)
Albumin: 3.9 g/dL (ref 3.5–5.2)
Alkaline Phosphatase: 102 U/L (ref 39–117)
BUN: 7 mg/dL (ref 6–23)
CO2: 26 mEq/L (ref 19–32)
Calcium: 9.5 mg/dL (ref 8.4–10.5)
Chloride: 103 mEq/L (ref 96–112)
Creatinine, Ser: 0.47 mg/dL (ref 0.40–1.20)
GFR: 142.14 mL/min (ref >60.00)
Glucose, Bld: 89 mg/dL (ref 70–99)
Potassium: 3.7 mEq/L (ref 3.5–5.1)
Sodium: 138 mEq/L (ref 135–145)
Total Bilirubin: 0.3 mg/dL (ref 0.2–1.2)
Total Protein: 7.3 g/dL (ref 6.0–8.3)

## 2016-01-25 LAB — LIPID PANEL
Cholesterol: 203 mg/dL — ABNORMAL HIGH (ref 0–200)
HDL: 37.5 mg/dL — ABNORMAL LOW (ref >39.00)
LDL Cholesterol: 129 mg/dL — ABNORMAL HIGH (ref 0–99)
NonHDL: 165.27
Total CHOL/HDL Ratio: 5
Triglycerides: 180 mg/dL — ABNORMAL HIGH (ref 0.0–149.0)
VLDL: 36 mg/dL (ref 0.0–40.0)

## 2016-01-25 LAB — CBC
HCT: 40.9 % (ref 36.0–46.0)
Hemoglobin: 13.8 g/dL (ref 12.0–15.0)
MCHC: 33.8 g/dL (ref 30.0–36.0)
MCV: 79 fl (ref 78.0–100.0)
Platelets: 211 10*3/uL (ref 150.0–400.0)
RBC: 5.18 Mil/uL — ABNORMAL HIGH (ref 3.87–5.11)
RDW: 14.8 % (ref 11.5–15.5)
WBC: 7.5 10*3/uL (ref 4.0–10.5)

## 2016-01-25 LAB — TSH: TSH: 2.76 u[IU]/mL (ref 0.35–4.50)

## 2016-01-25 LAB — HEMOGLOBIN A1C: Hgb A1c MFr Bld: 5.7 % (ref 4.6–6.5)

## 2016-01-25 NOTE — Progress Notes (Signed)
Pre visit review using our clinic review tool, if applicable. No additional management support is needed unless otherwise documented below in the visit note. 

## 2016-01-25 NOTE — Assessment & Plan Note (Signed)
minimize simple carbs. Increase exercise as tolerated.  

## 2016-01-25 NOTE — Assessment & Plan Note (Signed)
Well controlled, no changes to meds. Encouraged heart healthy diet such as the DASH diet and exercise as tolerated.  °

## 2016-01-25 NOTE — Patient Instructions (Signed)

## 2016-01-25 NOTE — Assessment & Plan Note (Signed)
Encouraged heart healthy diet, increase exercise, avoid trans fats, consider a krill oil cap daily 

## 2016-01-31 ENCOUNTER — Encounter: Payer: Self-pay | Admitting: Family Medicine

## 2016-01-31 DIAGNOSIS — G4730 Sleep apnea, unspecified: Secondary | ICD-10-CM | POA: Insufficient documentation

## 2016-01-31 DIAGNOSIS — G4733 Obstructive sleep apnea (adult) (pediatric): Secondary | ICD-10-CM

## 2016-01-31 DIAGNOSIS — Z9989 Dependence on other enabling machines and devices: Secondary | ICD-10-CM

## 2016-01-31 HISTORY — DX: Obstructive sleep apnea (adult) (pediatric): G47.33

## 2016-01-31 HISTORY — DX: Dependence on other enabling machines and devices: Z99.89

## 2016-01-31 NOTE — Assessment & Plan Note (Signed)
Has now been started on CPAP machine and feels much better. Uses her machine nightly and tolerates it well. She needs to stay on her CPAP nightly and agrees to do so. Her download report from home health is reviewed at visit and confims use.

## 2016-01-31 NOTE — Progress Notes (Signed)
Patient ID: [name], female   DOB: [birth year], 65 y.o.   MRN: [id number]   Subjective:    Patient ID: [name], female    DOB: Feb 24, 1951, 65 y.o.   MRN: [id number]  Chief Complaint  Patient presents with  . Follow-up    HPI Patient is in today for follow up. She is considering a colostomy bag due to a multiple hour bowel regimen daily. She is using her CPAP machine daily and is feeling better with less fatigue. Denies CP/palp/SOB/HA/congestion/fevers or GU c/o. Taking meds as prescribed  Past Medical History:  Diagnosis Date  . Acute bronchitis 05/16/2015  . Arthritis   . Bowel dysfunction    constipation and fecal retention  . Cerumen impaction 10/11/2015  . Constipation 01/25/2016  . Contusion 05/18/2014   Of foot  . History of kidney stones   . Hyperglycemia 05/18/2015  . Hyperlipidemia   . Hypertension   . Incomplete quadriplegia at C5-6 level ([place]) 07/24/2008   Qualifier: Diagnosis of  By: Wynona Luna Requires bowel regimen including enemas and indwelling foley cath Has had tendency towards pressure sores in past. Had a tear with a transfer in Hospital in past for surgery and developed a sore at that time.   . Medicare annual wellness visit, subsequent 11/02/2014   Follows with [facility] Declines colonoscopy  No h/o abnormal pap, declines further paps  . Mitral valve disorder 12/01/2012  . Neurogenic bladder   . Obesity   . Obstructive sleep apnea 01/31/2016  . Pain in joint, shoulder region 12/01/2012   Long history B/l [facility]  . Postmenopausal bleeding 12/17/2012  . Preventative health care 06/26/2013  . Quadriplegia ([place]) 1981   Secondary to ruptured AVM C5-C6  . Sacral pressure ulcer 12/01/2012   H/o Follows with Dr Tomi Likens of Dermatology  . Vaginitis and vulvovaginitis 12/17/2012    Past Surgical History:  Procedure Laterality Date  . HERNIA REPAIR  0000000   umbilical hernia repair  . LAMINECTOMY  1981   AVM  .  [place]   left shoulder  . SHOULDER ARTHROSCOPY  2008   right  . TOTAL SHOULDER REPLACEMENT  2001   left  . TRANSFER TENDON HAND  1982, 1983   to right hand  . VENTRAL HERNIA REPAIR  08/17/2010   Postop ileus (Dr. Redmond Pulling)    Family History  Problem Relation Age of Onset  . Cancer Mother     breast & melanoma  . Hypertension Mother   . Heart disease Father     CAD, MI, AFib  . Cancer Sister     melanoma  . Cancer Maternal Aunt     breast  . Cancer Paternal Aunt     breast  . Cancer Paternal Uncle     breast  . Cancer Paternal Aunt     breast  . Cancer Maternal Grandfather     melanoma  . Stroke Paternal Grandmother   . Heart disease Paternal Grandfather     Social History   Social History  . Marital status: Single    Spouse name: N/A  . Number of children: N/A  . Years of education: N/A   Occupational History  . Disabled     math teacher   Social History Main Topics  . Smoking status: Never Smoker  . Smokeless tobacco: Never Used  . Alcohol use No  . Drug use: Unknown  .  Sexual activity: Not on file     Comment: lives with sister and mother, no major dietary restrictions   Other Topics Concern  . Not on file   Social History Narrative   Sister is primary caregiver    Outpatient Medications Prior to Visit  Medication Sig Dispense Refill  . baclofen (LIORESAL) 20 MG tablet Take 1/2 to 1 tablet by mouth three times a day. 90 each 6  . clobetasol (TEMOVATE) 0.05 % external solution Apply topically.    . diazepam (VALIUM) 5 MG tablet Take 1 tablet (5 mg total) by mouth daily as needed for anxiety. 1/2 tablet by mouth at bedtime as needed. 30 tablet 2  . furosemide (LASIX) 20 MG tablet Take 1 tablet (20 mg total) by mouth 2 (two) times daily. 180 tablet 2  . HYDROcodone-acetaminophen (NORCO) 5-325 MG per tablet Take 1 tablet by mouth every 6 (six) hours as needed. 40 tablet 0  . Multiple Vitamins-Calcium (ONE-A-DAY WOMENS FORMULA  PO) Take 1 tablet by mouth daily.      Marland Kitchen oxybutynin (DITROPAN) 5 MG tablet Take 5 mg by mouth every morning.      . potassium chloride SA (K-DUR,KLOR-CON) 20 MEQ tablet Take 2 tablets by mouth two times a day. 360 tablet 0  . triamcinolone cream (KENALOG) 0.1 % Apply 1 application topically 3 (three) times daily. 60 g 2   No facility-administered medications prior to visit.     Allergies  Allergen Reactions  . Adhesive [Tape] Other (See Comments)    Skin Irritant.    Review of Systems  Constitutional: Negative for chills, fever and malaise/fatigue.  HENT: Negative for congestion and hearing loss.   Eyes: Negative for discharge.  Respiratory: Negative for cough, sputum production and shortness of breath.   Cardiovascular: Negative for chest pain, palpitations and leg swelling.  Gastrointestinal: Positive for constipation. Negative for abdominal pain, blood in stool, diarrhea, heartburn, nausea and vomiting.  Genitourinary: Negative for dysuria, frequency, hematuria and urgency.  Musculoskeletal: Negative for back pain, falls and myalgias.  Skin: Negative for rash.  Neurological: Negative for dizziness, sensory change, loss of consciousness, weakness and headaches.  Endo/Heme/Allergies: Negative for environmental allergies. Does not bruise/bleed easily.  Psychiatric/Behavioral: Negative for depression and suicidal ideas. The patient is not nervous/anxious and does not have insomnia.        Objective:    Physical Exam  Constitutional: She is oriented to person, place, and time. She appears well-developed and well-nourished. No distress.  In power wheelchair.  HENT:  Head: Normocephalic and atraumatic.  Nose: Nose normal.  Eyes: Right eye exhibits no discharge. Left eye exhibits no discharge.  Neck: Normal range of motion. Neck supple.  Cardiovascular: Normal rate and regular rhythm.   No murmur heard. Pulmonary/Chest: Effort normal and breath sounds normal.  Abdominal: Soft.  Bowel sounds are normal. There is no tenderness.  Musculoskeletal: She exhibits no edema.  Quadreplegia  Neurological: She is alert and oriented to person, place, and time.  Skin: Skin is warm and dry.  Psychiatric: She has a normal mood and affect.  Nursing note and vitals reviewed.   BP 122/72 (BP Location: Left Arm, Patient Position: Sitting, Cuff Size: Large)   Pulse 72   Temp 98.4 F (36.9 C) (Oral)   Ht 6' (1.829 m)   Wt 220 lb (99.8 kg)   SpO2 96%   BMI 29.84 kg/m  Wt Readings from Last 3 Encounters:  01/25/16 220 lb (99.8 kg)  09/28/15 220 lb (99.8  kg)  05/18/15 220 lb (99.8 kg)     Lab Results  Component Value Date   WBC 7.5 01/25/2016   HGB 13.8 01/25/2016   HCT 40.9 01/25/2016   PLT 211.0 01/25/2016   GLUCOSE 89 01/25/2016   CHOL 203 (H) 01/25/2016   TRIG 180.0 (H) 01/25/2016   HDL 37.50 (L) 01/25/2016   LDLDIRECT 139.0 09/28/2015   LDLCALC 129 (H) 01/25/2016   ALT 17 01/25/2016   AST 14 01/25/2016   NA 138 01/25/2016   K 3.7 01/25/2016   CL 103 01/25/2016   CREATININE 0.47 01/25/2016   BUN 7 01/25/2016   CO2 26 01/25/2016   TSH 2.76 01/25/2016   HGBA1C 5.7 01/25/2016    Lab Results  Component Value Date   TSH 2.76 01/25/2016   Lab Results  Component Value Date   WBC 7.5 01/25/2016   HGB 13.8 01/25/2016   HCT 40.9 01/25/2016   MCV 79.0 01/25/2016   PLT 211.0 01/25/2016   Lab Results  Component Value Date   NA 138 01/25/2016   K 3.7 01/25/2016   CO2 26 01/25/2016   GLUCOSE 89 01/25/2016   BUN 7 01/25/2016   CREATININE 0.47 01/25/2016   BILITOT 0.3 01/25/2016   ALKPHOS 102 01/25/2016   AST 14 01/25/2016   ALT 17 01/25/2016   PROT 7.3 01/25/2016   ALBUMIN 3.9 01/25/2016   CALCIUM 9.5 01/25/2016   GFR 142.14 01/25/2016   Lab Results  Component Value Date   CHOL 203 (H) 01/25/2016   Lab Results  Component Value Date   HDL 37.50 (L) 01/25/2016   Lab Results  Component Value Date   LDLCALC 129 (H) 01/25/2016   Lab Results    Component Value Date   TRIG 180.0 (H) 01/25/2016   Lab Results  Component Value Date   CHOLHDL 5 01/25/2016   Lab Results  Component Value Date   HGBA1C 5.7 01/25/2016       Assessment & Plan:   Problem List Items Addressed This Visit    Hyperlipidemia    Encouraged heart healthy diet, increase exercise, avoid trans fats, consider a krill oil cap daily      Relevant Orders   Lipid panel (Completed)   Essential hypertension    Well controlled, no changes to meds. Encouraged heart healthy diet such as the DASH diet and exercise as tolerated.       Relevant Orders   TSH (Completed)   CBC (Completed)   Comprehensive metabolic panel (Completed)   Hyperglycemia    minimize simple carbs. Increase exercise as tolerated.       Relevant Orders   Hemoglobin A1c (Completed)   Constipation    Spend hours every day on bowel regimen. Is considering a colostomy bag to help will need assistance to maintain.      Obstructive sleep apnea    Has now been started on CPAP machine and feels much better. Uses her machine nightly and tolerates it well. She needs to stay on her CPAP nightly and agrees to do so. Her download report from home health is reviewed at visit and confims use.        Other Visit Diagnoses   None.     I am having Ms. Mcglathery maintain her Multiple Vitamins-Calcium (ONE-A-DAY WOMENS FORMULA PO), oxybutynin, triamcinolone cream, diazepam, HYDROcodone-acetaminophen, furosemide, clobetasol, baclofen, and potassium chloride SA.  No orders of the defined types were placed in this encounter.    Penni Homans, MD

## 2016-01-31 NOTE — Assessment & Plan Note (Addendum)
Spend hours every day on bowel regimen. Is considering a colostomy bag to help will need assistance to maintain.

## 2016-02-03 ENCOUNTER — Telehealth: Payer: Self-pay | Admitting: *Deleted

## 2016-02-03 NOTE — Telephone Encounter (Signed)
Last office visit was faxed to [facility] at [phone]).  Confirmation received.//AB/CMA   Received Request for Supporting Documentation on CPAP/BIPAP Device paperwork via fax from [place].  Placed in folder for Dr.Blyth to review.  Per Dr. Charlett Blake the last office visit is a face to face regarding the CPAP.  Please forward that note.//AB/CMA

## 2016-02-22 HISTORY — PX: COLOSTOMY: SHX63

## 2016-02-22 HISTORY — PX: LAPAROSCOPIC SIGMOID COLECTOMY: SHX5928

## 2016-02-22 HISTORY — PX: COLONOSCOPY: SHX174

## 2016-02-29 ENCOUNTER — Telehealth: Payer: Self-pay | Admitting: Family Medicine

## 2016-02-29 NOTE — Telephone Encounter (Signed)
OK to give requested VO 

## 2016-02-29 NOTE — Telephone Encounter (Signed)
Caller name:Amanda Relationship to patient:[facility] Can be reached:[phone] Pharmacy:  Reason for call: Requesting verbal order for ostomy care increasing to 1 week for 1 visiti, to 2 week for 2 visits and another 1 week for 1 visit.

## 2016-03-01 NOTE — Telephone Encounter (Signed)
Verbal ok left message with [facility]

## 2016-03-04 ENCOUNTER — Encounter: Payer: Self-pay | Admitting: Family Medicine

## 2016-04-12 ENCOUNTER — Other Ambulatory Visit: Payer: Self-pay | Admitting: Family Medicine

## 2016-04-18 LAB — HM MAMMOGRAPHY

## 2016-04-20 ENCOUNTER — Ambulatory Visit (INDEPENDENT_AMBULATORY_CARE_PROVIDER_SITE_OTHER): Payer: Medicare Other | Admitting: Behavioral Health

## 2016-04-20 DIAGNOSIS — Z23 Encounter for immunization: Secondary | ICD-10-CM | POA: Diagnosis not present

## 2016-04-21 ENCOUNTER — Encounter: Payer: Self-pay | Admitting: Family Medicine

## 2016-04-26 ENCOUNTER — Ambulatory Visit: Payer: Medicare Other | Admitting: Family Medicine

## 2016-04-26 ENCOUNTER — Other Ambulatory Visit: Payer: Self-pay | Admitting: Family Medicine

## 2016-05-18 ENCOUNTER — Telehealth: Payer: Self-pay | Admitting: *Deleted

## 2016-05-18 NOTE — Telephone Encounter (Signed)
Called pt to schedule AWV. She would like to schedule appt in January. She will set up appt while in the office tomorrow as she was driving at time of call and did not have her calendar.

## 2016-05-19 ENCOUNTER — Ambulatory Visit (INDEPENDENT_AMBULATORY_CARE_PROVIDER_SITE_OTHER): Payer: Medicare Other | Admitting: Family Medicine

## 2016-05-19 ENCOUNTER — Encounter: Payer: Self-pay | Admitting: Family Medicine

## 2016-05-19 DIAGNOSIS — I10 Essential (primary) hypertension: Secondary | ICD-10-CM

## 2016-05-19 DIAGNOSIS — K5909 Other constipation: Secondary | ICD-10-CM

## 2016-05-19 DIAGNOSIS — E782 Mixed hyperlipidemia: Secondary | ICD-10-CM

## 2016-05-19 DIAGNOSIS — R739 Hyperglycemia, unspecified: Secondary | ICD-10-CM | POA: Diagnosis not present

## 2016-05-19 MED ORDER — MUPIROCIN 2 % EX OINT: 1.0000 "application " | TOPICAL_OINTMENT | Freq: Two times a day (BID) | CUTANEOUS | 1 refills | Status: DC

## 2016-05-19 NOTE — Assessment & Plan Note (Signed)
Tolerating statin, encouraged heart healthy diet, avoid trans fats, minimize simple carbs and saturated fats. Increase exercise as tolerated 

## 2016-05-19 NOTE — Patient Instructions (Addendum)
Aged or black garlic Carbohydrate Counting for Diabetes Mellitus, Adult Carbohydrate counting is a method for keeping track of how many carbohydrates you eat. Eating carbohydrates naturally increases the amount of sugar (glucose) in the blood. Counting how many carbohydrates you eat helps keep your blood glucose within normal limits, which helps you manage your diabetes (diabetes mellitus). It is important to know how many carbohydrates you can safely have in each meal. This is different for every person. A diet and nutrition specialist (registered dietitian) can help you make a meal plan and calculate how many carbohydrates you should have at each meal and snack. Carbohydrates are found in the following foods:  Grains, such as breads and cereals.  Dried beans and soy products.  Starchy vegetables, such as potatoes, peas, and corn.  Fruit and fruit juices.  Milk and yogurt.  Sweets and snack foods, such as cake, cookies, candy, chips, and soft drinks. How do I count carbohydrates? There are two ways to count carbohydrates in food. You can use either of the methods or a combination of both. Reading "Nutrition Facts" on packaged food  The "Nutrition Facts" list is included on the labels of almost all packaged foods and beverages in the [place] It includes:  The serving size.  Information about nutrients in each serving, including the grams (g) of carbohydrate per serving. To use the "Nutrition Facts":  Decide how many servings you will have.  Multiply the number of servings by the number of carbohydrates per serving.  The resulting number is the total amount of carbohydrates that you will be having. Learning standard serving sizes of other foods  When you eat foods containing carbohydrates that are not packaged or do not include "Nutrition Facts" on the label, you need to measure the servings in order to count the amount of carbohydrates:  Measure the foods that you will eat with a food  scale or measuring cup, if needed.  Decide how many standard-size servings you will eat.  Multiply the number of servings by 15. Most carbohydrate-rich foods have about 15 g of carbohydrates per serving.  For example, if you eat 8 oz (170 g) of strawberries, you will have eaten 2 servings and 30 g of carbohydrates (2 servings x 15 g = 30 g).  For foods that have more than one food mixed, such as soups and casseroles, you must count the carbohydrates in each food that is included. The following list contains standard serving sizes of common carbohydrate-rich foods. Each of these servings has about 15 g of carbohydrates:   hamburger bun or  English muffin.   oz (15 mL) syrup.   oz (14 g) jelly.  1 slice of bread.  1 six-inch tortilla.  3 oz (85 g) cooked rice or pasta.  4 oz (113 g) cooked dried beans.  4 oz (113 g) starchy vegetable, such as peas, corn, or potatoes.  4 oz (113 g) hot cereal.  4 oz (113 g) mashed potatoes or  of a large baked potato.  4 oz (113 g) canned or frozen fruit.  4 oz (120 mL) fruit juice.  4-6 crackers.  6 chicken nuggets.  6 oz (170 g) unsweetened dry cereal.  6 oz (170 g) plain fat-free yogurt or yogurt sweetened with artificial sweeteners.  8 oz (240 mL) milk.  8 oz (170 g) fresh fruit or one small piece of fruit.  24 oz (680 g) popped popcorn. Example of carbohydrate counting Sample meal  3 oz (85 g) chicken  breast.  6 oz (170 g) brown rice.  4 oz (113 g) corn.  8 oz (240 mL) milk.  8 oz (170 g) strawberries with sugar-free whipped topping. Carbohydrate calculation 1. Identify the foods that contain carbohydrates:  Rice.  Corn.  Milk.  Strawberries. 2. Calculate how many servings you have of each food:  2 servings rice.  1 serving corn.  1 serving milk.  1 serving strawberries. 3. Multiply each number of servings by 15 g:  2 servings rice x 15 g = 30 g.  1 serving corn x 15 g = 15 g.  1 serving milk  x 15 g = 15 g.  1 serving strawberries x 15 g = 15 g. 4. Add together all of the amounts to find the total grams of carbohydrates eaten:  30 g + 15 g + 15 g + 15 g = 75 g of carbohydrates total. This information is not intended to replace advice given to you by your health care provider. Make sure you discuss any questions you have with your health care provider. Document Released: 05/30/2005 Document Revised: 12/18/2015 Document Reviewed: 11/11/2015 Elsevier Interactive Patient Education  2017 [employer].

## 2016-05-19 NOTE — Assessment & Plan Note (Addendum)
hgba1c acceptable, minimize simple carbs. Increase exercise as tolerated.  

## 2016-05-19 NOTE — Assessment & Plan Note (Signed)
Well controlled, no changes to meds. Encouraged heart healthy diet such as the DASH diet and exercise as tolerated.  °

## 2016-05-19 NOTE — Progress Notes (Signed)
Pre visit review using our clinic review tool, if applicable. No additional management support is needed unless otherwise documented below in the visit note. 

## 2016-05-20 ENCOUNTER — Other Ambulatory Visit: Payer: Self-pay | Admitting: Family Medicine

## 2016-05-20 LAB — COMPREHENSIVE METABOLIC PANEL
ALT: 23 U/L (ref 0–35)
AST: 15 U/L (ref 0–37)
Albumin: 4 g/dL (ref 3.5–5.2)
Alkaline Phosphatase: 100 U/L (ref 39–117)
BUN: 10 mg/dL (ref 6–23)
CO2: 32 mEq/L (ref 19–32)
Calcium: 9.8 mg/dL (ref 8.4–10.5)
Chloride: 104 mEq/L (ref 96–112)
Creatinine, Ser: 0.57 mg/dL (ref 0.40–1.20)
GFR: 113.66 mL/min (ref >60.00)
Glucose, Bld: 98 mg/dL (ref 70–99)
Potassium: 4.5 mEq/L (ref 3.5–5.1)
Sodium: 143 mEq/L (ref 135–145)
Total Bilirubin: 0.3 mg/dL (ref 0.2–1.2)
Total Protein: 7.6 g/dL (ref 6.0–8.3)

## 2016-05-20 LAB — LIPID PANEL
Cholesterol: 218 mg/dL — ABNORMAL HIGH (ref 0–200)
HDL: 37.1 mg/dL — ABNORMAL LOW (ref >39.00)
NonHDL: 181.3
Total CHOL/HDL Ratio: 6
Triglycerides: 248 mg/dL — ABNORMAL HIGH (ref 0.0–149.0)
VLDL: 49.6 mg/dL — ABNORMAL HIGH (ref 0.0–40.0)

## 2016-05-20 LAB — CBC
HCT: 41.3 % (ref 36.0–46.0)
Hemoglobin: 13.8 g/dL (ref 12.0–15.0)
MCHC: 33.3 g/dL (ref 30.0–36.0)
MCV: 79.4 fl (ref 78.0–100.0)
Platelets: 248 10*3/uL (ref 150.0–400.0)
RBC: 5.21 Mil/uL — ABNORMAL HIGH (ref 3.87–5.11)
RDW: 15.4 % (ref 11.5–15.5)
WBC: 8.1 10*3/uL (ref 4.0–10.5)

## 2016-05-20 LAB — TSH: TSH: 2.37 u[IU]/mL (ref 0.35–4.50)

## 2016-05-20 LAB — HEMOGLOBIN A1C: Hgb A1c MFr Bld: 5.8 % (ref 4.6–6.5)

## 2016-05-20 LAB — LDL CHOLESTEROL, DIRECT: Direct LDL: 144 mg/dL

## 2016-05-20 MED ORDER — ATORVASTATIN CALCIUM 10 MG PO TABS: ORAL_TABLET | ORAL | 5 refills | Status: DC

## 2016-06-11 ENCOUNTER — Telehealth: Payer: Self-pay

## 2016-06-11 NOTE — Telephone Encounter (Signed)
error 

## 2016-06-12 NOTE — Progress Notes (Signed)
Patient ID: [name], female   DOB: 01-Dec-1950, 65 y.o.   MRN: [id number]   Subjective:    Patient ID: [name], female    DOB: [birth year], 66 y.o.   MRN: [id number]  Chief Complaint  Patient presents with  . Hyperglycemia    18-month FU.    HPI Patient is in today for follow up accompanied by her sister. She is doing well. Her colostomy was a success and she is having no further trouble with her bowles. She also underwent a cystogram earlier this month and no new concerns were noted. Denies CP/palp/SOB/HA/congestion/fevers. Taking meds as prescribed  Past Medical History:  Diagnosis Date  . Acute bronchitis 05/16/2015  . Arthritis   . Bowel dysfunction    constipation and fecal retention  . Cerumen impaction 10/11/2015  . Constipation 01/25/2016  . Contusion 05/18/2014   Of foot  . History of kidney stones   . Hyperglycemia 05/18/2015  . Hyperlipidemia   . Hypertension   . Incomplete quadriplegia at C5-6 level ([place]) 07/24/2008   Qualifier: Diagnosis of  By: Wynona Luna Requires bowel regimen including enemas and indwelling foley cath Has had tendency towards pressure sores in past. Had a tear with a transfer in Hospital in past for surgery and developed a sore at that time.   . Medicare annual wellness visit, subsequent 11/02/2014   Follows with [facility] Declines colonoscopy  No h/o abnormal pap, declines further paps  . Mitral valve disorder 12/01/2012  . Neurogenic bladder   . Obesity   . Obstructive sleep apnea 01/31/2016  . Pain in joint, shoulder region 12/01/2012   Long history B/l Follows with Dr Hassell Done at [facility]  . Postmenopausal bleeding 12/17/2012  . Preventative health care 06/26/2013  . Quadriplegia ([place]) 1981   Secondary to ruptured AVM C5-C6  . Sacral pressure ulcer 12/01/2012   H/o Follows with Dr Tomi Likens of Dermatology  . Vaginitis and vulvovaginitis 12/17/2012    Past Surgical History:  Procedure Laterality Date  . COLOSTOMY   02/22/2016  . HERNIA REPAIR  0000000   umbilical hernia repair  . LAMINECTOMY  1981   AVM  . [place]   left shoulder  . SHOULDER ARTHROSCOPY  2008   right  . TOTAL SHOULDER REPLACEMENT  2001   left  . TRANSFER TENDON HAND  1982, 1983   to right hand  . VENTRAL HERNIA REPAIR  08/17/2010   Postop ileus (Dr. Redmond Pulling)    Family History  Problem Relation Age of Onset  . Cancer Mother     breast & melanoma  . Hypertension Mother   . Heart disease Father     CAD, MI, AFib  . Cancer Sister     melanoma  . Cancer Maternal Grandfather     melanoma  . Stroke Paternal Grandmother   . Heart disease Paternal Grandfather   . Cancer Maternal Aunt     breast  . Cancer Paternal Aunt     breast  . Cancer Paternal Uncle     breast  . Cancer Paternal Aunt     breast    Social History   Social History  . Marital status: Single    Spouse name: N/A  . Number of children: N/A  . Years of education: N/A   Occupational History  . Disabled     math teacher   Social History Main Topics  . Smoking status: Never Smoker  .  Smokeless tobacco: Never Used  . Alcohol use No  . Drug use: Unknown  . Sexual activity: Not on file     Comment: lives with sister and mother, no major dietary restrictions   Other Topics Concern  . Not on file   Social History Narrative   Sister is primary caregiver    Outpatient Medications Prior to Visit  Medication Sig Dispense Refill  . baclofen (LIORESAL) 20 MG tablet Take 1/2 to 1 tablet by mouth three times a day. 90 each 6  . clobetasol (TEMOVATE) 0.05 % external solution Apply topically.    . diazepam (VALIUM) 5 MG tablet Take 1 tablet (5 mg total) by mouth daily as needed for anxiety. 1/2 tablet by mouth at bedtime as needed. 30 tablet 2  . furosemide (LASIX) 20 MG tablet TAKE 1 TABLET BY MOUTH TWICE DAILY 180 tablet 0  . HYDROcodone-acetaminophen (NORCO) 5-325 MG per tablet Take 1 tablet by mouth every 6 (six) hours as  needed. 40 tablet 0  . Multiple Vitamins-Calcium (ONE-A-DAY WOMENS FORMULA PO) Take 1 tablet by mouth daily.      Marland Kitchen oxybutynin (DITROPAN) 5 MG tablet Take 5 mg by mouth every morning.      . potassium chloride SA (K-DUR,KLOR-CON) 20 MEQ tablet TAKE 2 TABLETS BY MOUTH TWICE DAILY 360 tablet 0  . triamcinolone cream (KENALOG) 0.1 % Apply 1 application topically 3 (three) times daily. (Patient not taking: Reported on 05/19/2016) 60 g 2   No facility-administered medications prior to visit.     Allergies  Allergen Reactions  . Adhesive [Tape] Other (See Comments)    Skin Irritant.    Review of Systems  Constitutional: Negative for fever and malaise/fatigue.  HENT: Negative for congestion.   Eyes: Negative for blurred vision.  Respiratory: Negative for shortness of breath.   Cardiovascular: Negative for chest pain, palpitations and leg swelling.  Gastrointestinal: Negative for abdominal pain, blood in stool and nausea.  Genitourinary: Negative for dysuria and frequency.  Musculoskeletal: Negative for falls.  Skin: Negative for rash.  Neurological: Negative for dizziness, loss of consciousness and headaches.  Endo/Heme/Allergies: Negative for environmental allergies.  Psychiatric/Behavioral: Negative for depression. The patient is not nervous/anxious.        Objective:    Physical Exam  Constitutional: She is oriented to person, place, and time. She appears well-developed and well-nourished. No distress.  HENT:  Head: Normocephalic and atraumatic.  Nose: Nose normal.  Eyes: Right eye exhibits no discharge. Left eye exhibits no discharge.  Neck: Normal range of motion. Neck supple.  Cardiovascular: Normal rate and regular rhythm.   No murmur heard. Pulmonary/Chest: Effort normal and breath sounds normal.  Abdominal: Soft. Bowel sounds are normal. There is no tenderness.  Musculoskeletal: She exhibits no edema.  Neurological: She is alert and oriented to person, place, and time.    Wheelchair bound  Skin: Skin is warm and dry.  Psychiatric: She has a normal mood and affect.  Nursing note and vitals reviewed.   BP 130/80 (BP Location: Left Arm, Patient Position: Sitting, Cuff Size: Large)   Pulse 63   Temp 98.2 F (36.8 C) (Oral)   Wt 221 lb (100.2 kg) Comment: Unable to stand on scale. Documented wieght is Patient repor  SpO2 97% Comment: RA  BMI 29.97 kg/m  Wt Readings from Last 3 Encounters:  05/19/16 221 lb (100.2 kg)  01/25/16 220 lb (99.8 kg)  09/28/15 220 lb (99.8 kg)     Lab Results  Component Value  Date   WBC 8.1 05/19/2016   HGB 13.8 05/19/2016   HCT 41.3 05/19/2016   PLT 248.0 05/19/2016   GLUCOSE 98 05/19/2016   CHOL 218 (H) 05/19/2016   TRIG 248.0 (H) 05/19/2016   HDL 37.10 (L) 05/19/2016   LDLDIRECT 144.0 05/19/2016   LDLCALC 129 (H) 01/25/2016   ALT 23 05/19/2016   AST 15 05/19/2016   NA 143 05/19/2016   K 4.5 05/19/2016   CL 104 05/19/2016   CREATININE 0.57 05/19/2016   BUN 10 05/19/2016   CO2 32 05/19/2016   TSH 2.37 05/19/2016   HGBA1C 5.8 05/19/2016    Lab Results  Component Value Date   TSH 2.37 05/19/2016   Lab Results  Component Value Date   WBC 8.1 05/19/2016   HGB 13.8 05/19/2016   HCT 41.3 05/19/2016   MCV 79.4 05/19/2016   PLT 248.0 05/19/2016   Lab Results  Component Value Date   NA 143 05/19/2016   K 4.5 05/19/2016   CO2 32 05/19/2016   GLUCOSE 98 05/19/2016   BUN 10 05/19/2016   CREATININE 0.57 05/19/2016   BILITOT 0.3 05/19/2016   ALKPHOS 100 05/19/2016   AST 15 05/19/2016   ALT 23 05/19/2016   PROT 7.6 05/19/2016   ALBUMIN 4.0 05/19/2016   CALCIUM 9.8 05/19/2016   GFR 113.66 05/19/2016   Lab Results  Component Value Date   CHOL 218 (H) 05/19/2016   Lab Results  Component Value Date   HDL 37.10 (L) 05/19/2016   Lab Results  Component Value Date   LDLCALC 129 (H) 01/25/2016   Lab Results  Component Value Date   TRIG 248.0 (H) 05/19/2016   Lab Results  Component Value Date    CHOLHDL 6 05/19/2016   Lab Results  Component Value Date   HGBA1C 5.8 05/19/2016       Assessment & Plan:   Problem List Items Addressed This Visit    Hyperlipidemia    Tolerating statin, encouraged heart healthy diet, avoid trans fats, minimize simple carbs and saturated fats. Increase exercise as tolerated      Relevant Orders   Lipid panel (Completed)   Essential hypertension    Well controlled, no changes to meds. Encouraged heart healthy diet such as the DASH diet and exercise as tolerated.       Relevant Orders   CBC (Completed)   Comprehensive metabolic panel (Completed)   TSH (Completed)   Hyperglycemia    hgba1c acceptable, minimize simple carbs. Increase exercise as tolerated      Relevant Orders   Hemoglobin A1c (Completed)   Constipation    No further difficultires since her surgery for a colostomy was performed         I have discontinued Ms. Gaultney's triamcinolone cream. I am also having her start on mupirocin ointment. Additionally, I am having her maintain her Multiple Vitamins-Calcium (ONE-A-DAY WOMENS FORMULA PO), oxybutynin, diazepam, HYDROcodone-acetaminophen, clobetasol, baclofen, potassium chloride SA, furosemide, DIGESTIVE ADVANTAGE, imiquimod, methylcellulose, docusate sodium, and metroNIDAZOLE.  Meds ordered this encounter  Medications  . Probiotic Product (DIGESTIVE ADVANTAGE) CAPS    Sig: Take 1 tablet by mouth daily.  . imiquimod (ALDARA) 5 % cream    Sig: Apply 1 g topically daily.  . methylcellulose (FIBER THERAPY) oral powder    Sig: Take 1 capsule by mouth daily.  Marland Kitchen docusate sodium (COLACE) 100 MG capsule    Sig: Take 1 tablet by mouth daily.  . metroNIDAZOLE (METROCREAM) 0.75 % cream    Sig:  Apply topically 2 (two) times daily.  . mupirocin ointment (BACTROBAN) 2 %    Sig: Place 1 application into the nose 2 (two) times daily.    Dispense:  22 g    Refill:  1      BLYTH, STACEY, MD

## 2016-06-12 NOTE — Assessment & Plan Note (Signed)
No further difficultires since her surgery for a colostomy was performed

## 2016-06-13 HISTORY — PX: BASAL CELL CARCINOMA EXCISION: SHX1214

## 2016-06-13 HISTORY — PX: SQUAMOUS CELL CARCINOMA EXCISION: SHX2433

## 2016-06-21 ENCOUNTER — Encounter: Payer: Self-pay | Admitting: *Deleted

## 2016-06-21 ENCOUNTER — Ambulatory Visit (INDEPENDENT_AMBULATORY_CARE_PROVIDER_SITE_OTHER): Payer: Medicare Other | Admitting: *Deleted

## 2016-06-21 VITALS — BP 131/65 | HR 70 | Resp 18

## 2016-06-21 DIAGNOSIS — I10 Essential (primary) hypertension: Secondary | ICD-10-CM | POA: Diagnosis not present

## 2016-06-21 DIAGNOSIS — Z0000 Encounter for general adult medical examination without abnormal findings: Secondary | ICD-10-CM | POA: Diagnosis not present

## 2016-06-21 NOTE — Progress Notes (Addendum)
Subjective:   [name] is a 66 y.o. female who presents for Medicare Annual (Subsequent) preventive examination.  Review of Systems:  No ROS.  Medicare Wellness Visit.  Cardiac Risk Factors include: obesity (BMI >30kg/m2);sedentary lifestyle;hypertension  Sleep patterns: no sleep issues and feels rested on waking.  CPAP 7 hours nightly. Home Safety/Smoke Alarms: Feels safe in home. Smoke alarms in place.  Living environment; residence and Firearm Safety: Lives w/ sister, [name], in [place], can live on one level, no firearms. Seat Belt Safety/Bike Helmet: Wears seat belt.   Counseling:   Eye Exam- Dr. Katy Fitch every 2 years  Dental- Dr. Bess Harvest every 6 months  Female:   Pap- Last in Dec. 2017 w/ Dr. Jeffie Pollock. Normal per pt.       Mammo- last 04/18/16. BI-RADS Category 2: Benign     Dexa scan- Not on file.       CCS- Cologuard 05/24/15. Negative.     Objective:     Vitals: BP 131/65 (BP Location: Left Arm, Patient Position: Sitting, Cuff Size: Large)   Pulse 70   Resp 18   SpO2 98%   There is no height or weight on file to calculate BMI.  BP Readings from Last 3 Encounters:  06/21/16 131/65  05/19/16 130/80  01/25/16 122/72    Tobacco History  Smoking Status  . Never Smoker  Smokeless Tobacco  . Never Used     Counseling given: Not Answered   Past Medical History:  Diagnosis Date  . Acute bronchitis 05/16/2015  . Arthritis   . Bowel dysfunction    constipation and fecal retention  . Cerumen impaction 10/11/2015  . Constipation 01/25/2016  . Contusion 05/18/2014   Of foot  . History of kidney stones   . Hyperglycemia 05/18/2015  . Hyperlipidemia   . Hypertension   . Incomplete quadriplegia at C5-6 level ([place]) 07/24/2008   Qualifier: Diagnosis of  By: Wynona Luna Requires bowel regimen including enemas and indwelling foley cath Has had tendency towards pressure sores in past. Had a tear with a transfer in Hospital in past for surgery and developed  a sore at that time.   . Medicare annual wellness visit, subsequent 11/02/2014   Follows with [facility] Declines colonoscopy  No h/o abnormal pap, declines further paps  . Mitral valve disorder 12/01/2012  . Neurogenic bladder   . Obesity   . Obstructive sleep apnea 01/31/2016  . Pain in joint, shoulder region 12/01/2012   Long history B/l Follows with Dr Hassell Done at [facility]  . Postmenopausal bleeding 12/17/2012  . Preventative health care 06/26/2013  . Quadriplegia ([place]) 1981   Secondary to ruptured AVM C5-C6  . Sacral pressure ulcer 12/01/2012   H/o Follows with Dr Tomi Likens of Dermatology  . Vaginitis and vulvovaginitis 12/17/2012   Past Surgical History:  Procedure Laterality Date  . COLOSTOMY  02/22/2016  . HERNIA REPAIR  0000000   umbilical hernia repair  . LAMINECTOMY  1981   AVM  . [place]   left shoulder  . SHOULDER ARTHROSCOPY  2008   right  . TOTAL SHOULDER REPLACEMENT  2001   left  . TRANSFER TENDON HAND  1982, 1983   to right hand  . VENTRAL HERNIA REPAIR  08/17/2010   Postop ileus (Dr. Redmond Pulling)   Family History  Problem Relation Age of Onset  . Cancer Mother     breast & melanoma  . Hypertension  Mother   . Heart disease Father     CAD, MI, AFib  . Cancer Sister     melanoma  . Cancer Maternal Grandfather     melanoma  . Stroke Paternal Grandmother   . Heart disease Paternal Grandfather   . Cancer Maternal Aunt     breast  . Cancer Paternal Aunt     breast  . Cancer Paternal Uncle     breast  . Cancer Paternal Aunt     breast   History  Sexual Activity  . Sexual activity: Not on file    Comment: lives with sister and mother, no major dietary restrictions    Outpatient Encounter Prescriptions as of 06/21/2016  Medication Sig  . atorvastatin (LIPITOR) 10 MG tablet Take 1/2 tablet by mouth every other day.  . baclofen (LIORESAL) 20 MG tablet Take 1/2 to 1 tablet by mouth three times a day.  . clobetasol  (TEMOVATE) 0.05 % external solution Apply topically.  . diazepam (VALIUM) 5 MG tablet Take 1 tablet (5 mg total) by mouth daily as needed for anxiety. 1/2 tablet by mouth at bedtime as needed.  . docusate sodium (COLACE) 100 MG capsule Take 1 tablet by mouth daily.  Marland Kitchen FIBER PO Take 1 capsule by mouth daily.  . furosemide (LASIX) 20 MG tablet TAKE 1 TABLET BY MOUTH TWICE DAILY  . HYDROcodone-acetaminophen (NORCO) 5-325 MG per tablet Take 1 tablet by mouth every 6 (six) hours as needed.  . imiquimod (ALDARA) 5 % cream Apply 1 g topically daily.  . methylcellulose (FIBER THERAPY) oral powder Take 1 capsule by mouth daily.  . metroNIDAZOLE (METROCREAM) 0.75 % cream Apply topically 2 (two) times daily.  . Multiple Vitamins-Calcium (ONE-A-DAY WOMENS FORMULA PO) Take 1 tablet by mouth daily.    . mupirocin ointment (BACTROBAN) 2 % Place 1 application into the nose 2 (two) times daily.  Marland Kitchen oxybutynin (DITROPAN) 5 MG tablet Take 5 mg by mouth every morning.    . potassium chloride SA (K-DUR,KLOR-CON) 20 MEQ tablet TAKE 2 TABLETS BY MOUTH TWICE DAILY  . Probiotic Product (DIGESTIVE ADVANTAGE) CAPS Take 1 tablet by mouth daily.   No facility-administered encounter medications on file as of 06/21/2016.     Activities of Daily Living In your present state of health, do you have any difficulty performing the following activities: 06/21/2016  Hearing? N  Vision? N  Difficulty concentrating or making decisions? N  Walking or climbing stairs? Y  Dressing or bathing? Y  Doing errands, shopping? N  Preparing Food and eating ? N  Using the Toilet? N  In the past six months, have you accidently leaked urine? N  Do you have problems with loss of bowel control? N  Managing your Medications? N  Managing your Finances? N  Housekeeping or managing your Housekeeping? N  Some recent data might be hidden    Patient Care Team: Mosie Lukes, MD as PCP - General (Family Medicine) Irine Seal, MD as Attending  Physician (Urology) Clent Jacks, MD as Consulting Physician (Ophthalmology) Laurance Flatten, MD as Consulting Physician (Orthopedic Surgery) Cheryll Cockayne, MD as Consulting Physician (Surgical Oncology) Ronald Lobo, MD as Consulting Physician (Gastroenterology)    Assessment:    Physical assessment deferred to PCP.  Exercise Activities and Dietary recommendations Current Exercise Habits: The patient does not participate in regular exercise at present, Exercise limited by: neurologic condition(s)  Diet (meal preparation, eat out, water intake, caffeinated beverages, dairy products, fruits and vegetables): in general, a "  healthy" diet  , well balanced. 50/50 eating home and eating out. Pt would like to eat more vegetables, but she does not cook much at home. Drinks flavored water, some tea.      Goals    . Increase water intake      Fall Risk Fall Risk  06/21/2016  Falls in the past year? No   Depression Screen PHQ 2/9 Scores 06/21/2016  PHQ - 2 Score 0     Cognitive Function MMSE - Mini Mental State Exam 06/21/2016  Orientation to time 5  Orientation to Place 5  Registration 3  Attention/ Calculation 5  Recall 3  Language- name 2 objects 2  Language- repeat 1  Language- follow 3 step command 3  Language- read & follow direction 1  Write a sentence 1  Copy design 1  Total score 30        Immunization History  Administered Date(s) Administered  . H1N1 07/24/2008  . Influenza Split 04/05/2011, 04/18/2012  . Influenza Whole 06/03/2008, 03/31/2009, 04/01/2010  . Influenza,inj,Quad PF,36+ Mos 03/20/2013, 03/03/2014, 05/18/2015, 04/20/2016  . Pneumococcal Conjugate-13 06/24/2013  . Pneumococcal Polysaccharide-23 12/07/2005  . Td 06/03/2008  . Zoster 11/26/2012   Screening Tests Health Maintenance  Topic Date Due  . HIV Screening  10/30/1967  . MAMMOGRAM  04/18/2018  . Fecal DNA (Cologuard)  05/23/2018  . TETANUS/TDAP  06/03/2018  . PAP SMEAR  05/19/2019  .  INFLUENZA VACCINE  Completed  . ZOSTAVAX  Completed  . Hepatitis C Screening  Completed      Plan:    Follow-up w/ PCP as scheduled.  During the course of the visit the patient was educated and counseled about the following appropriate screening and preventive services:   Vaccines to include Pneumoccal, Influenza, Hepatitis B, Td, Zostavax, HCV  Cardiovascular Disease  Colorectal cancer screening  Bone density screening  Diabetes screening  Glaucoma screening  Mammography/PAP  Nutrition counseling   Patient Instructions (the written plan) was given to the patient.   Dorrene German, RN  06/22/2016  RN AWV note reviewed. Agree with documention and plan.  Penni Homans, MD

## 2016-06-21 NOTE — Patient Instructions (Signed)
It was nice to meet you today!!! Follow-up w/ Dr. Charlett Blake as scheduled.  Bring a copy of your advance directives to your next office visit. Keep a log of your home BPs and bring to your next appointment.

## 2016-06-21 NOTE — Progress Notes (Signed)
Pre visit review using our clinic review tool, if applicable. No additional management support is needed unless otherwise documented below in the visit note. 

## 2016-06-22 NOTE — Assessment & Plan Note (Signed)
BP elevated initially, acceptable on recheck. Pt reports compliance w/ medication. She is experiencing low BP 2-3x weekly, as low as 70s/40s. This has been going on for 'a while.' She gets a 'feeling' that starts in the back of her neck and spreads downward when this happens. There is associated drowsiness, lightheadedness, and 'increased intensity of light'. Episodes last about 3-4 minutes and resolve spontaneously to normotensive state. Pt thinks she has discussed this w/ PCP in the last, but is not sure. She is to follow-up w/ PCP as scheduled, and call if any new/worsening symptoms.

## 2016-07-14 ENCOUNTER — Other Ambulatory Visit: Payer: Self-pay | Admitting: Family Medicine

## 2016-07-25 ENCOUNTER — Other Ambulatory Visit: Payer: Self-pay | Admitting: Family Medicine

## 2016-08-22 ENCOUNTER — Ambulatory Visit: Payer: Medicare Other | Admitting: Family Medicine

## 2016-10-03 ENCOUNTER — Ambulatory Visit: Payer: Medicare Other | Admitting: Family Medicine

## 2016-10-17 ENCOUNTER — Other Ambulatory Visit: Payer: Self-pay | Admitting: Family Medicine

## 2016-10-31 ENCOUNTER — Ambulatory Visit (INDEPENDENT_AMBULATORY_CARE_PROVIDER_SITE_OTHER): Payer: Medicare Other | Admitting: Family Medicine

## 2016-10-31 ENCOUNTER — Encounter: Payer: Self-pay | Admitting: Family Medicine

## 2016-10-31 VITALS — BP 159/92 | Temp 98.4°F | Wt 221.0 lb

## 2016-10-31 DIAGNOSIS — G8254 Quadriplegia, C5-C7 incomplete: Secondary | ICD-10-CM | POA: Diagnosis not present

## 2016-10-31 DIAGNOSIS — R739 Hyperglycemia, unspecified: Secondary | ICD-10-CM | POA: Diagnosis not present

## 2016-10-31 DIAGNOSIS — K5909 Other constipation: Secondary | ICD-10-CM

## 2016-10-31 DIAGNOSIS — C4490 Unspecified malignant neoplasm of skin, unspecified: Secondary | ICD-10-CM

## 2016-10-31 DIAGNOSIS — E669 Obesity, unspecified: Secondary | ICD-10-CM

## 2016-10-31 DIAGNOSIS — I10 Essential (primary) hypertension: Secondary | ICD-10-CM

## 2016-10-31 DIAGNOSIS — E782 Mixed hyperlipidemia: Secondary | ICD-10-CM

## 2016-10-31 MED ORDER — ATORVASTATIN CALCIUM 10 MG PO TABS: ORAL_TABLET | ORAL | 5 refills | Status: DC

## 2016-10-31 NOTE — Progress Notes (Signed)
Patient ID: [name], female   DOB: 01-26-1951, 66 y.o.   MRN: [id number]   Subjective:  I acted as a Education administrator for Penni Homans, [place], [place]   Patient ID: [name], female    DOB: Aug 09, 1950, 66 y.o.   MRN: [id number]  Chief Complaint  Patient presents with  . Hyperglycemia    F/U.    Hyperglycemia  This is a chronic problem. Pertinent negatives include no chest pain, congestion, coughing, fever, headaches, rash or vomiting.    Patient is in today for a follow up on hyperglycemia. Patient has a Hx of HTN, OSA, hyperlipidemia, anemia, insomnia, hyperglycemia. Patient has no acute concerns noted at this time. She is acoompanied By her sister. They report her bowel prep continues to be much better status post her surgery and diversion. No concerns. She has had 3 skin cancers removed by her dermatologist Dr. Wilhemina Bonito. 2 basal cell carcinomas were removed from her right leg and one squamous cell from her left. She is healed well although the lesion on her left leg is still slightly open because she keeps knocking the scab off. No fevers or chills. No drainage. She does note her blood pressures been very labile at home with highs in the 132G systolic and lows in the 40N. Frequent numbers are occurring in the 52s. Her diastolic has had been ranging from the 40s to 80s. She denies being symptomatic when these swings occur. Denies CP/palp/SOB/HA/congestion/fevers/GI or GU c/o. Taking meds as prescribed  Patient Care Team: Mosie Lukes, MD as PCP - General (Family Medicine) Irine Seal, MD as Attending Physician (Urology) Clent Jacks, MD as Consulting Physician (Ophthalmology) Laurance Flatten, MD as Consulting Physician (Orthopedic Surgery) Cheryll Cockayne, MD as Consulting Physician (Surgical Oncology) Ronald Lobo, MD as Consulting Physician (Gastroenterology)   Past Medical History:  Diagnosis Date  . Acute bronchitis 05/16/2015  . Arthritis   . Bowel dysfunction    constipation  and fecal retention  . Cerumen impaction 10/11/2015  . Constipation 01/25/2016  . Contusion 05/18/2014   Of foot  . History of kidney stones   . Hyperglycemia 05/18/2015  . Hyperlipidemia   . Hypertension   . Incomplete quadriplegia at C5-6 level ([place]) 07/24/2008   Qualifier: Diagnosis of  By: Wynona Luna Requires bowel regimen including enemas and indwelling foley cath Has had tendency towards pressure sores in past. Had a tear with a transfer in Hospital in past for surgery and developed a sore at that time.   . Medicare annual wellness visit, subsequent 11/02/2014   Follows with [facility] Declines colonoscopy  No h/o abnormal pap, declines further paps  . Mitral valve disorder 12/01/2012  . Neurogenic bladder   . Obesity   . Obesity 11/01/2016  . Obstructive sleep apnea 01/31/2016  . Pain in joint, shoulder region 12/01/2012   Long history B/l Follows with Dr Hassell Done at [facility]  . Postmenopausal bleeding 12/17/2012  . Preventative health care 06/26/2013  . Quadriplegia ([facility]) 1981   Secondary to ruptured AVM C5-C6  . Sacral pressure ulcer 12/01/2012   H/o Follows with Dr Tomi Likens of Dermatology  . Vaginitis and vulvovaginitis 12/17/2012    Past Surgical History:  Procedure Laterality Date  . COLOSTOMY  02/22/2016  . HERNIA REPAIR  0272   umbilical hernia repair  . LAMINECTOMY  1981   AVM  . [place]   left shoulder  . SHOULDER ARTHROSCOPY  2008  right  . TOTAL SHOULDER REPLACEMENT  2001   left  . TRANSFER TENDON HAND  1982, 1983   to right hand  . VENTRAL HERNIA REPAIR  08/17/2010   Postop ileus (Dr. Redmond Pulling)    Family History  Problem Relation Age of Onset  . Cancer Mother        breast & melanoma  . Hypertension Mother   . Heart disease Father        CAD, MI, AFib  . Cancer Sister        melanoma  . Cancer Maternal Grandfather        melanoma  . Stroke Paternal Grandmother   . Heart disease Paternal Grandfather   .  Cancer Maternal Aunt        breast  . Cancer Paternal Aunt        breast  . Cancer Paternal Uncle        breast  . Cancer Paternal Aunt        breast    Social History   Social History  . Marital status: Single    Spouse name: N/A  . Number of children: N/A  . Years of education: N/A   Occupational History  . Disabled     math teacher   Social History Main Topics  . Smoking status: Never Smoker  . Smokeless tobacco: Never Used  . Alcohol use No  . Drug use: No  . Sexual activity: Not on file     Comment: lives with sister and mother, no major dietary restrictions   Other Topics Concern  . Not on file   Social History Narrative   Sister is primary caregiver    Outpatient Medications Prior to Visit  Medication Sig Dispense Refill  . baclofen (LIORESAL) 20 MG tablet Take 1/2 to 1 tablet by mouth three times a day. 90 each 6  . clobetasol (TEMOVATE) 0.05 % external solution Apply topically.    . docusate sodium (COLACE) 100 MG capsule Take 1 tablet by mouth daily.    Marland Kitchen FIBER PO Take 1 capsule by mouth daily.    . furosemide (LASIX) 20 MG tablet TAKE 1 TABLET BY MOUTH TWICE DAILY 180 tablet 1  . imiquimod (ALDARA) 5 % cream Apply 1 g topically daily.    . methylcellulose (FIBER THERAPY) oral powder Take 1 capsule by mouth daily.    . metroNIDAZOLE (METROCREAM) 0.75 % cream Apply topically 2 (two) times daily.    . Multiple Vitamins-Calcium (ONE-A-DAY WOMENS FORMULA PO) Take 1 tablet by mouth daily.      . mupirocin ointment (BACTROBAN) 2 % Place 1 application into the nose 2 (two) times daily. 22 g 1  . oxybutynin (DITROPAN) 5 MG tablet Take 5 mg by mouth every morning.      . potassium chloride SA (K-DUR,KLOR-CON) 20 MEQ tablet TAKE 2 TABLETS BY MOUTH TWICE DAILY 360 tablet 0  . Probiotic Product (DIGESTIVE ADVANTAGE) CAPS Take 1 tablet by mouth daily.    Marland Kitchen atorvastatin (LIPITOR) 10 MG tablet Take 1/2 tablet by mouth every other day. 10 tablet 5  . diazepam (VALIUM) 5  MG tablet Take 1 tablet (5 mg total) by mouth daily as needed for anxiety. 1/2 tablet by mouth at bedtime as needed. 30 tablet 2  . HYDROcodone-acetaminophen (NORCO) 5-325 MG per tablet Take 1 tablet by mouth every 6 (six) hours as needed. 40 tablet 0   No facility-administered medications prior to visit.     Allergies  Allergen  Reactions  . Adhesive [Tape] Other (See Comments)    Skin Irritant.    Review of Systems  Constitutional: Negative for fever and malaise/fatigue.  HENT: Negative for congestion.   Eyes: Negative for blurred vision.  Respiratory: Negative for cough and shortness of breath.   Cardiovascular: Negative for chest pain, palpitations and leg swelling.  Gastrointestinal: Negative for vomiting.  Musculoskeletal: Negative for back pain.  Skin: Negative for rash.  Neurological: Negative for loss of consciousness and headaches.       Objective:    Physical Exam  Constitutional: She is oriented to person, place, and time. She appears well-developed and well-nourished. No distress.  Wheelchair bound with C5-7 quadreplegia  HENT:  Head: Normocephalic and atraumatic.  Eyes: Conjunctivae are normal.  Neck: Normal range of motion. No thyromegaly present.  Cardiovascular: Normal rate and regular rhythm.   Pulmonary/Chest: Effort normal and breath sounds normal. She has no wheezes.  Abdominal: Soft. Bowel sounds are normal. There is no tenderness.  Musculoskeletal: She exhibits no edema or deformity.  Neurological: She is alert and oriented to person, place, and time.  Skin: Skin is warm and dry. She is not diaphoretic.  1 cm scab left tibial plateau no surrounding erythema or fluctuance  Psychiatric: She has a normal mood and affect.    BP (!) 159/92 (BP Location: Left Wrist, Patient Position: Sitting, Cuff Size: Normal)   Temp 98.4 F (36.9 C) (Oral)   Wt 221 lb (100.2 kg)   BMI 29.97 kg/m  Wt Readings from Last 3 Encounters:  10/31/16 221 lb (100.2 kg)    05/19/16 221 lb (100.2 kg)  01/25/16 220 lb (99.8 kg)   BP Readings from Last 3 Encounters:  10/31/16 (!) 159/92  06/21/16 131/65  05/19/16 130/80     Immunization History  Administered Date(s) Administered  . H1N1 07/24/2008  . Influenza Split 04/05/2011, 04/18/2012  . Influenza Whole 06/03/2008, 03/31/2009, 04/01/2010  . Influenza,inj,Quad PF,36+ Mos 03/20/2013, 03/03/2014, 05/18/2015, 04/20/2016  . Pneumococcal Conjugate-13 06/24/2013  . Pneumococcal Polysaccharide-23 12/07/2005  . Td 06/03/2008  . Zoster 11/26/2012    Health Maintenance  Topic Date Due  . HIV Screening  10/30/1967  . INFLUENZA VACCINE  01/11/2017  . MAMMOGRAM  04/18/2018  . Fecal DNA (Cologuard)  05/23/2018  . TETANUS/TDAP  06/03/2018  . PAP SMEAR  05/19/2019  . Hepatitis C Screening  Completed    Lab Results  Component Value Date   WBC 8.1 05/19/2016   HGB 13.8 05/19/2016   HCT 41.3 05/19/2016   PLT 248.0 05/19/2016   GLUCOSE 98 05/19/2016   CHOL 218 (H) 05/19/2016   TRIG 248.0 (H) 05/19/2016   HDL 37.10 (L) 05/19/2016   LDLDIRECT 144.0 05/19/2016   LDLCALC 129 (H) 01/25/2016   ALT 23 05/19/2016   AST 15 05/19/2016   NA 143 05/19/2016   K 4.5 05/19/2016   CL 104 05/19/2016   CREATININE 0.57 05/19/2016   BUN 10 05/19/2016   CO2 32 05/19/2016   TSH 2.37 05/19/2016   HGBA1C 5.8 05/19/2016    Lab Results  Component Value Date   TSH 2.37 05/19/2016   Lab Results  Component Value Date   WBC 8.1 05/19/2016   HGB 13.8 05/19/2016   HCT 41.3 05/19/2016   MCV 79.4 05/19/2016   PLT 248.0 05/19/2016   Lab Results  Component Value Date   NA 143 05/19/2016   K 4.5 05/19/2016   CO2 32 05/19/2016   GLUCOSE 98 05/19/2016   BUN 10  05/19/2016   CREATININE 0.57 05/19/2016   BILITOT 0.3 05/19/2016   ALKPHOS 100 05/19/2016   AST 15 05/19/2016   ALT 23 05/19/2016   PROT 7.6 05/19/2016   ALBUMIN 4.0 05/19/2016   CALCIUM 9.8 05/19/2016   GFR 113.66 05/19/2016   Lab Results  Component  Value Date   CHOL 218 (H) 05/19/2016   Lab Results  Component Value Date   HDL 37.10 (L) 05/19/2016   Lab Results  Component Value Date   LDLCALC 129 (H) 01/25/2016   Lab Results  Component Value Date   TRIG 248.0 (H) 05/19/2016   Lab Results  Component Value Date   CHOLHDL 6 05/19/2016   Lab Results  Component Value Date   HGBA1C 5.8 05/19/2016         Assessment & Plan:   Problem List Items Addressed This Visit    Hyperlipidemia    Tolerating statin, encouraged heart healthy diet, avoid trans fats, minimize simple carbs and saturated fats. Increase exercise as tolerated      Relevant Medications   atorvastatin (LIPITOR) 10 MG tablet   Other Relevant Orders   Lipid panel   Incomplete quadriplegia at C5-6 level [facility])    Is doing well with new power chair and aide of her sister. They have been able to get some assistance in the home at times and that has been helpful as well      Essential hypertension - Primary    Elevated here but more frequently low at home with BP in the 80s over 40s with some frequency. Patient is wheelchair bound and denies any significant symptoms. Encouraged increased hydration with one 4-6 oz beverage of gatorade daily for next month to see if BP stabilizes. They will report any concerns      Relevant Medications   atorvastatin (LIPITOR) 10 MG tablet   Other Relevant Orders   CBC   Comprehensive metabolic panel   TSH   Hyperglycemia    minimize simple carbs. Increase exercise as tolerated.      Relevant Orders   Hemoglobin A1c   Constipation    No longer struggling with several hour bowel regiment daily s/p bowel resection and diversion. She is very pleased      Obesity      I have discontinued Ms. Favia's diazepam and HYDROcodone-acetaminophen. I am also having her maintain her Multiple Vitamins-Calcium (ONE-A-DAY WOMENS FORMULA PO), oxybutynin, clobetasol, baclofen, DIGESTIVE ADVANTAGE, imiquimod, methylcellulose, docusate  sodium, metroNIDAZOLE, mupirocin ointment, FIBER PO, furosemide, potassium chloride SA, and atorvastatin.  Meds ordered this encounter  Medications  . atorvastatin (LIPITOR) 10 MG tablet    Sig: Take 1/2 tablet by mouth every other day.    Dispense:  10 tablet    Refill:  5    CMA served as scribe during this visit. History, Physical and Plan performed by medical provider. Documentation and orders reviewed and attested to.  Penni Homans, MD

## 2016-10-31 NOTE — Patient Instructions (Signed)
Hypotension  Hypotension, commonly called low blood pressure, is when the force of blood pumping through your arteries is too weak. Arteries are blood vessels that carry blood from the heart throughout the body. When blood pressure is too low, you may not get enough blood to your brain or to the rest of your organs. This can cause weakness, light-headedness, rapid heartbeat, and fainting.  Depending on the cause and severity, hypotension may be harmless (benign) or cause serious problems (critical).  What are the causes?  Possible causes of hypotension include:  · Blood loss.  · Loss of body fluids (dehydration).  · Heart problems.  · Hormone (endocrine) problems.  · Pregnancy.  · Severe infection.  · Lack of certain nutrients.  · Severe allergic reactions (anaphylaxis).  · Certain medicines, such as blood pressure medicine or medicines that make the body lose excess fluids (diuretics). Sometimes, hypotension can be caused by not taking medicine as directed, such as taking too much of a certain medicine.    What increases the risk?  Certain factors can make you more likely to develop hypotension, including:  · Age. Risk increases as you get older.  · Conditions that affect the heart or the central nervous system.  · Taking certain medicines, such as blood pressure medicine or diuretics.  · Being pregnant.    What are the signs or symptoms?  Symptoms of this condition may include:  · Weakness.  · Light-headedness.  · Dizziness.  · Blurred vision.  · Fatigue.  · Rapid heartbeat.  · Fainting, in severe cases.    How is this diagnosed?  This condition is diagnosed based on:  · Your medical history.  · Your symptoms.  · Your blood pressure measurement. Your health care provider will check your blood pressure when you are:  ? Lying down.  ? Sitting.  ? Standing.    A blood pressure reading is recorded as two numbers, such as "120 over 80" (or 120/80). The first ("top") number is called the systolic pressure. It is a  measure of the pressure in your arteries as your heart beats. The second ("bottom") number is called the diastolic pressure. It is a measure of the pressure in your arteries when your heart relaxes between beats. Blood pressure is measured in a unit called mm Hg. Healthy blood pressure for adults is 120/80. If your blood pressure is below 90/60, you may be diagnosed with hypotension.  Other information or tests that may be used to diagnose hypotension include:  · Your other vital signs, such as your heart rate and temperature.  · Blood tests.  · Tilt table test. For this test, you will be safely secured to a table that moves you from a lying position to an upright position. Your heart rhythm and blood pressure will be monitored during the test.    How is this treated?  Treatment for this condition may include:  · Changing your diet. This may involve eating more salt (sodium) or drinking more water.  · Taking medicines to raise your blood pressure.  · Changing the dosage of certain medicines you are taking that might be lowering your blood pressure.  · Wearing compression stockings. These stockings help to prevent blood clots and reduce swelling in your legs.    In some cases, you may need to go to the hospital for:  · Fluid replacement. This means you will receive fluids through an IV tube.  · Blood replacement. This means you will receive   donated blood through an IV tube (transfusion).  · Treating an infection or heart problems, if this applies.  · Monitoring. You may need to be monitored while medicines that you are taking wear off.    Follow these instructions at home:  Eating and drinking     · Drink enough fluid to keep your urine clear or pale yellow.  · Eat a healthy diet and follow instructions from your health care provider about eating or drinking restrictions. A healthy diet includes:  ? Fresh fruits and vegetables.  ? Whole grains.  ? Lean meats.  ? Low-fat dairy products.  · Eat extra salt only as  directed. Do not add extra salt to your diet unless your health care provider told you to do that.  · Eat frequent, small meals.  · Avoid standing up suddenly after eating.  Medicines   · Take over-the-counter and prescription medicines only as told by your health care provider.  ? Follow instructions from your health care provider about changing the dosage of your current medicines, if this applies.  ? Do not stop or adjust any of your medicines on your own.  General instructions   · Wear compression stockings as told by your health care provider.  · Get up slowly from lying down or sitting positions. This gives your blood pressure a chance to adjust.  · Avoid hot showers and excessive heat as directed by your health care provider.  · Return to your normal activities as told by your health care provider. Ask your health care provider what activities are safe for you.  · Do not use any products that contain nicotine or tobacco, such as cigarettes and e-cigarettes. If you need help quitting, ask your health care provider.  · Keep all follow-up visits as told by your health care provider. This is important.  Contact a health care provider if:  · You vomit.  · You have diarrhea.  · You have a fever for more than 2-3 days.  · You feel more thirsty than usual.  · You feel weak and tired.  Get help right away if:  · You have chest pain.  · You have a fast or irregular heartbeat.  · You develop numbness in any part of your body.  · You cannot move your arms or your legs.  · You have trouble speaking.  · You become sweaty or feel light-headed.  · You faint.  · You feel short of breath.  · You have trouble staying awake.  · You feel confused.  This information is not intended to replace advice given to you by your health care provider. Make sure you discuss any questions you have with your health care provider.  Document Released: 05/30/2005 Document Revised: 12/18/2015 Document Reviewed: 11/19/2015  Elsevier Interactive  Patient Education © 2017 [employer]

## 2016-10-31 NOTE — Progress Notes (Signed)
Pre visit review using our clinic review tool, if applicable. No additional management support is needed unless otherwise documented below in the visit note. 

## 2016-11-01 ENCOUNTER — Encounter: Payer: Self-pay | Admitting: Family Medicine

## 2016-11-01 DIAGNOSIS — E669 Obesity, unspecified: Secondary | ICD-10-CM

## 2016-11-01 DIAGNOSIS — C4490 Unspecified malignant neoplasm of skin, unspecified: Secondary | ICD-10-CM

## 2016-11-01 HISTORY — DX: Unspecified malignant neoplasm of skin, unspecified: C44.90

## 2016-11-01 HISTORY — DX: Obesity, unspecified: E66.9

## 2016-11-01 LAB — CBC
HCT: 42.3 % (ref 36.0–46.0)
Hemoglobin: 14 g/dL (ref 12.0–15.0)
MCHC: 33.2 g/dL (ref 30.0–36.0)
MCV: 81.5 fl (ref 78.0–100.0)
Platelets: 250 10*3/uL (ref 150.0–400.0)
RBC: 5.19 Mil/uL — ABNORMAL HIGH (ref 3.87–5.11)
RDW: 15.5 % (ref 11.5–15.5)
WBC: 7.9 10*3/uL (ref 4.0–10.5)

## 2016-11-01 LAB — COMPREHENSIVE METABOLIC PANEL
ALT: 34 U/L (ref 0–35)
AST: 22 U/L (ref 0–37)
Albumin: 4 g/dL (ref 3.5–5.2)
Alkaline Phosphatase: 87 U/L (ref 39–117)
BUN: 8 mg/dL (ref 6–23)
CO2: 29 mEq/L (ref 19–32)
Calcium: 9.5 mg/dL (ref 8.4–10.5)
Chloride: 102 mEq/L (ref 96–112)
Creatinine, Ser: 0.52 mg/dL (ref 0.40–1.20)
GFR: 126.18 mL/min (ref >60.00)
Glucose, Bld: 94 mg/dL (ref 70–99)
Potassium: 4.5 mEq/L (ref 3.5–5.1)
Sodium: 139 mEq/L (ref 135–145)
Total Bilirubin: 0.3 mg/dL (ref 0.2–1.2)
Total Protein: 7.4 g/dL (ref 6.0–8.3)

## 2016-11-01 LAB — LIPID PANEL
Cholesterol: 181 mg/dL (ref 0–200)
HDL: 39.4 mg/dL (ref >39.00)
LDL Cholesterol: 105 mg/dL — ABNORMAL HIGH (ref 0–99)
NonHDL: 141.14
Total CHOL/HDL Ratio: 5
Triglycerides: 183 mg/dL — ABNORMAL HIGH (ref 0.0–149.0)
VLDL: 36.6 mg/dL (ref 0.0–40.0)

## 2016-11-01 LAB — TSH: TSH: 2.68 u[IU]/mL (ref 0.35–4.50)

## 2016-11-01 LAB — HEMOGLOBIN A1C: Hgb A1c MFr Bld: 6 % (ref 4.6–6.5)

## 2016-11-01 NOTE — Assessment & Plan Note (Signed)
Is doing well with new power chair and aide of her sister. They have been able to get some assistance in the home at times and that has been helpful as well

## 2016-11-01 NOTE — Assessment & Plan Note (Signed)
minimize simple carbs. Increase exercise as tolerated.  

## 2016-11-01 NOTE — Assessment & Plan Note (Signed)
Lesion on left tibial plateau is healing slowly with 1 cm scab noted without surrounding fluctuance.

## 2016-11-01 NOTE — Assessment & Plan Note (Signed)
Elevated here but more frequently low at home with BP in the 80s over 40s with some frequency. Patient is wheelchair bound and denies any significant symptoms. Encouraged increased hydration with one 4-6 oz beverage of gatorade daily for next month to see if BP stabilizes. They will report any concerns

## 2016-11-01 NOTE — Assessment & Plan Note (Signed)
Tolerating statin, encouraged heart healthy diet, avoid trans fats, minimize simple carbs and saturated fats. Increase exercise as tolerated 

## 2016-11-01 NOTE — Assessment & Plan Note (Signed)
No longer struggling with several hour bowel regiment daily s/p bowel resection and diversion. She is very pleased

## 2016-11-16 ENCOUNTER — Telehealth: Payer: Self-pay | Admitting: Family Medicine

## 2016-11-16 ENCOUNTER — Encounter: Payer: Self-pay | Admitting: Family Medicine

## 2016-11-17 ENCOUNTER — Other Ambulatory Visit: Payer: Self-pay | Admitting: Family Medicine

## 2016-11-17 MED ORDER — FLUCONAZOLE 150 MG PO TABS: 150.0000 mg | ORAL_TABLET | ORAL | 0 refills | Status: DC

## 2016-11-17 NOTE — Telephone Encounter (Signed)
Relation to BM:WUXL Call back number: [phone] [facility]) Pharmacy: [place], [place]. [phone] (Phone) [phone] (Fax)     Reason for call:  Patient declined appointment stating she's currently in a wheelchair, patient requesting Rx for vaginitis, patient would like a follow up call best # [phone] (M)

## 2016-11-17 NOTE — Telephone Encounter (Signed)
error:[id number] ° °

## 2017-01-10 ENCOUNTER — Other Ambulatory Visit: Payer: Self-pay | Admitting: Family Medicine

## 2017-01-19 ENCOUNTER — Other Ambulatory Visit: Payer: Self-pay | Admitting: Family Medicine

## 2017-02-06 ENCOUNTER — Ambulatory Visit (INDEPENDENT_AMBULATORY_CARE_PROVIDER_SITE_OTHER): Payer: Medicare Other | Admitting: Family Medicine

## 2017-02-06 ENCOUNTER — Encounter: Payer: Self-pay | Admitting: Family Medicine

## 2017-02-06 VITALS — BP 132/68 | HR 64 | Resp 14 | Ht 72.0 in

## 2017-02-06 DIAGNOSIS — G8254 Quadriplegia, C5-C7 incomplete: Secondary | ICD-10-CM

## 2017-02-06 DIAGNOSIS — R739 Hyperglycemia, unspecified: Secondary | ICD-10-CM | POA: Diagnosis not present

## 2017-02-06 DIAGNOSIS — I10 Essential (primary) hypertension: Secondary | ICD-10-CM | POA: Diagnosis not present

## 2017-02-06 DIAGNOSIS — E782 Mixed hyperlipidemia: Secondary | ICD-10-CM

## 2017-02-06 DIAGNOSIS — E669 Obesity, unspecified: Secondary | ICD-10-CM | POA: Diagnosis not present

## 2017-02-06 DIAGNOSIS — F329 Major depressive disorder, single episode, unspecified: Secondary | ICD-10-CM

## 2017-02-06 DIAGNOSIS — F32A Depression, unspecified: Secondary | ICD-10-CM

## 2017-02-06 MED ORDER — FLUOXETINE HCL 20 MG PO TABS: ORAL_TABLET | ORAL | 3 refills | Status: DC

## 2017-02-06 NOTE — Assessment & Plan Note (Signed)
Struggling with worries regarding her ongoing care and ability to stay in her home as her sister's health fades. She is encouraged to start estate planning and look at sites they might to transfer to to receive

## 2017-02-06 NOTE — Assessment & Plan Note (Signed)
Well controlled, no changes to meds. Encouraged heart healthy diet such as the DASH diet and exercise as tolerated.  °

## 2017-02-06 NOTE — Progress Notes (Signed)
Pre visit review using our clinic review tool, if applicable. No additional management support is needed unless otherwise documented below in the visit note. 

## 2017-02-06 NOTE — Patient Instructions (Signed)

## 2017-02-06 NOTE — Assessment & Plan Note (Signed)
Encouraged heart healthy diet, increase exercise, avoid trans fats, consider a krill oil cap daily 

## 2017-02-07 LAB — LIPID PANEL
Cholesterol: 174 mg/dL (ref 0–200)
HDL: 30.5 mg/dL — ABNORMAL LOW (ref >39.00)
NonHDL: 143.52
Total CHOL/HDL Ratio: 6
Triglycerides: 250 mg/dL — ABNORMAL HIGH (ref 0.0–149.0)
VLDL: 50 mg/dL — ABNORMAL HIGH (ref 0.0–40.0)

## 2017-02-07 LAB — CBC
HCT: 41.3 % (ref 36.0–46.0)
Hemoglobin: 13.6 g/dL (ref 12.0–15.0)
MCHC: 33 g/dL (ref 30.0–36.0)
MCV: 82.5 fl (ref 78.0–100.0)
Platelets: 226 10*3/uL (ref 150.0–400.0)
RBC: 5.01 Mil/uL (ref 3.87–5.11)
RDW: 15.3 % (ref 11.5–15.5)
WBC: 7.2 10*3/uL (ref 4.0–10.5)

## 2017-02-07 LAB — COMPREHENSIVE METABOLIC PANEL
ALT: 26 U/L (ref 0–35)
AST: 20 U/L (ref 0–37)
Albumin: 3.7 g/dL (ref 3.5–5.2)
Alkaline Phosphatase: 80 U/L (ref 39–117)
BUN: 8 mg/dL (ref 6–23)
CO2: 30 mEq/L (ref 19–32)
Calcium: 9.4 mg/dL (ref 8.4–10.5)
Chloride: 103 mEq/L (ref 96–112)
Creatinine, Ser: 0.45 mg/dL (ref 0.40–1.20)
GFR: 148.96 mL/min (ref >60.00)
Glucose, Bld: 77 mg/dL (ref 70–99)
Potassium: 4.5 mEq/L (ref 3.5–5.1)
Sodium: 138 mEq/L (ref 135–145)
Total Bilirubin: 0.3 mg/dL (ref 0.2–1.2)
Total Protein: 7.1 g/dL (ref 6.0–8.3)

## 2017-02-07 LAB — TSH: TSH: 2.34 u[IU]/mL (ref 0.35–4.50)

## 2017-02-07 LAB — HEMOGLOBIN A1C: Hgb A1c MFr Bld: 5.9 % (ref 4.6–6.5)

## 2017-02-07 LAB — LDL CHOLESTEROL, DIRECT: Direct LDL: 105 mg/dL

## 2017-02-09 ENCOUNTER — Other Ambulatory Visit: Payer: Self-pay | Admitting: Family Medicine

## 2017-02-13 ENCOUNTER — Encounter: Payer: Self-pay | Admitting: Family Medicine

## 2017-02-13 DIAGNOSIS — F329 Major depressive disorder, single episode, unspecified: Secondary | ICD-10-CM | POA: Insufficient documentation

## 2017-02-13 DIAGNOSIS — F32A Depression, unspecified: Secondary | ICD-10-CM

## 2017-02-13 HISTORY — DX: Depression, unspecified: F32.A

## 2017-02-13 NOTE — Progress Notes (Signed)
Patient ID: [name], female   DOB: 01-10-1951, 66 y.o.   MRN: [id number]   Subjective:    Patient ID: [name], female    DOB: 02/24/1951, 66 y.o.   MRN: [id number]  Chief Complaint  Patient presents with  . Follow-up    3 mnth    HPI Patient is in today for follow up. She feels physically well. No recent illlness or fevers. She is noting depression is worsening as her health and her sister's health worsens. She is concerned about how she will manage her health in the future. She endorses anhedonia but not suicidal ideation. Denies CP/palp/SOB/HA/congestion/fevers/GI or GU c/o. Taking meds as prescribed  Past Medical History:  Diagnosis Date  . Acute bronchitis 05/16/2015  . Arthritis   . Bowel dysfunction    constipation and fecal retention  . Cerumen impaction 10/11/2015  . Constipation 01/25/2016  . Contusion 05/18/2014   Of foot  . Depression 02/13/2017  . History of kidney stones   . Hyperglycemia 05/18/2015  . Hyperlipidemia   . Hypertension   . Incomplete quadriplegia at C5-6 level ([place]) 07/24/2008   Qualifier: Diagnosis of  By: Wynona Luna Requires bowel regimen including enemas and indwelling foley cath Has had tendency towards pressure sores in past. Had a tear with a transfer in Hospital in past for surgery and developed a sore at that time.   . Medicare annual wellness visit, subsequent 11/02/2014   Follows with [facility] Declines colonoscopy  No h/o abnormal pap, declines further paps  . Mitral valve disorder 12/01/2012  . Neurogenic bladder   . Obesity   . Obesity 11/01/2016  . Obstructive sleep apnea 01/31/2016  . Pain in joint, shoulder region 12/01/2012   Long history B/l Follows with Dr Hassell Done at [facility]  . Postmenopausal bleeding 12/17/2012  . Preventative health care 06/26/2013  . Quadriplegia ([place]) 1981   Secondary to ruptured AVM C5-C6  . Sacral pressure ulcer 12/01/2012   H/o Follows with Dr Tomi Likens of Dermatology  . Skin cancer  11/01/2016   2 BCC RLE excised by Dr Wilhemina Bonito 2018 [place] LLE excised by Dr Wilhemina Bonito 2018  . Vaginitis and vulvovaginitis 12/17/2012    Past Surgical History:  Procedure Laterality Date  . COLOSTOMY  02/22/2016  . HERNIA REPAIR  9379   umbilical hernia repair  . LAMINECTOMY  1981   AVM  . [place]   left shoulder  . SHOULDER ARTHROSCOPY  2008   right  . TOTAL SHOULDER REPLACEMENT  2001   left  . TRANSFER TENDON HAND  1982, 1983   to right hand  . VENTRAL HERNIA REPAIR  08/17/2010   Postop ileus (Dr. Redmond Pulling)    Family History  Problem Relation Age of Onset  . Cancer Mother        breast & melanoma  . Hypertension Mother   . Heart disease Father        CAD, MI, AFib  . Cancer Sister        melanoma  . Cancer Maternal Grandfather        melanoma  . Stroke Paternal Grandmother   . Heart disease Paternal Grandfather   . Cancer Maternal Aunt        breast  . Cancer Paternal Aunt        breast  . Cancer Paternal Uncle        breast  . Cancer Paternal Aunt  breast    Social History   Social History  . Marital status: Single    Spouse name: N/A  . Number of children: N/A  . Years of education: N/A   Occupational History  . Disabled     math teacher   Social History Main Topics  . Smoking status: Never Smoker  . Smokeless tobacco: Never Used  . Alcohol use No  . Drug use: No  . Sexual activity: Not on file     Comment: lives with sister and mother, no major dietary restrictions   Other Topics Concern  . Not on file   Social History Narrative   Sister is primary caregiver    Outpatient Medications Prior to Visit  Medication Sig Dispense Refill  . atorvastatin (LIPITOR) 10 MG tablet Take 1/2 tablet by mouth every other day. 10 tablet 5  . clobetasol (TEMOVATE) 0.05 % external solution Apply topically.    . docusate sodium (COLACE) 100 MG capsule Take 1 tablet by mouth daily.    Marland Kitchen FIBER PO Take 1 capsule by mouth daily.    .  furosemide (LASIX) 20 MG tablet TAKE 1 TABLET BY MOUTH TWICE DAILY 180 tablet 0  . metroNIDAZOLE (METROCREAM) 0.75 % cream Apply topically 2 (two) times daily.    . Multiple Vitamins-Calcium (ONE-A-DAY WOMENS FORMULA PO) Take 1 tablet by mouth daily.      . mupirocin ointment (BACTROBAN) 2 % Place 1 application into the nose 2 (two) times daily. 22 g 1  . oxybutynin (DITROPAN) 5 MG tablet Take 5 mg by mouth every morning.      . potassium chloride SA (K-DUR,KLOR-CON) 20 MEQ tablet TAKE 2 TABLETS BY MOUTH TWICE DAILY 360 tablet 0  . Probiotic Product (DIGESTIVE ADVANTAGE) CAPS Take 1 tablet by mouth daily.    . baclofen (LIORESAL) 20 MG tablet Take 1/2 to 1 tablet by mouth three times a day. 90 each 6  . fluconazole (DIFLUCAN) 150 MG tablet Take 1 tablet (150 mg total) by mouth once a week. (Patient not taking: Reported on 02/06/2017) 2 tablet 0  . imiquimod (ALDARA) 5 % cream Apply 1 g topically daily.    . methylcellulose (FIBER THERAPY) oral powder Take 1 capsule by mouth daily.     No facility-administered medications prior to visit.     Allergies  Allergen Reactions  . Adhesive [Tape] Other (See Comments)    Skin Irritant.    Review of Systems  Constitutional: Positive for malaise/fatigue. Negative for fever.  HENT: Negative for congestion.   Eyes: Negative for blurred vision.  Respiratory: Negative for shortness of breath.   Cardiovascular: Negative for chest pain, palpitations and leg swelling.  Gastrointestinal: Negative for abdominal pain, blood in stool and nausea.  Genitourinary: Negative for dysuria and frequency.  Musculoskeletal: Negative for falls.  Skin: Negative for rash.  Neurological: Positive for focal weakness. Negative for dizziness, loss of consciousness and headaches.  Endo/Heme/Allergies: Negative for environmental allergies.  Psychiatric/Behavioral: Positive for depression. Negative for hallucinations, substance abuse and suicidal ideas. The patient is  nervous/anxious. The patient does not have insomnia.        Objective:    Physical Exam  Constitutional: She is oriented to person, place, and time. She appears well-developed and well-nourished. No distress.  HENT:  Head: Normocephalic and atraumatic.  Nose: Nose normal.  Eyes: Right eye exhibits no discharge. Left eye exhibits no discharge.  Neck: Normal range of motion. Neck supple.  Cardiovascular: Normal rate and regular rhythm.  No murmur heard. Pulmonary/Chest: Effort normal and breath sounds normal.  Abdominal: Soft. Bowel sounds are normal. There is no tenderness.  Musculoskeletal: She exhibits no edema.  Neurological: She is alert and oriented to person, place, and time. She displays abnormal reflex. She exhibits abnormal muscle tone. Coordination abnormal.  Skin: Skin is warm and dry.  Psychiatric: She has a normal mood and affect.  Nursing note and vitals reviewed.   BP 132/68 (BP Location: Left Arm, Patient Position: Sitting, Cuff Size: Small)   Pulse 64   Resp 14   Ht 6' (1.829 m)   SpO2 97%  Wt Readings from Last 3 Encounters:  10/31/16 221 lb (100.2 kg)  05/19/16 221 lb (100.2 kg)  01/25/16 220 lb (99.8 kg)     Lab Results  Component Value Date   WBC 7.2 02/06/2017   HGB 13.6 02/06/2017   HCT 41.3 02/06/2017   PLT 226.0 02/06/2017   GLUCOSE 77 02/06/2017   CHOL 174 02/06/2017   TRIG 250.0 (H) 02/06/2017   HDL 30.50 (L) 02/06/2017   LDLDIRECT 105.0 02/06/2017   LDLCALC 105 (H) 10/31/2016   ALT 26 02/06/2017   AST 20 02/06/2017   NA 138 02/06/2017   K 4.5 02/06/2017   CL 103 02/06/2017   CREATININE 0.45 02/06/2017   BUN 8 02/06/2017   CO2 30 02/06/2017   TSH 2.34 02/06/2017   HGBA1C 5.9 02/06/2017    Lab Results  Component Value Date   TSH 2.34 02/06/2017   Lab Results  Component Value Date   WBC 7.2 02/06/2017   HGB 13.6 02/06/2017   HCT 41.3 02/06/2017   MCV 82.5 02/06/2017   PLT 226.0 02/06/2017   Lab Results  Component Value  Date   NA 138 02/06/2017   K 4.5 02/06/2017   CO2 30 02/06/2017   GLUCOSE 77 02/06/2017   BUN 8 02/06/2017   CREATININE 0.45 02/06/2017   BILITOT 0.3 02/06/2017   ALKPHOS 80 02/06/2017   AST 20 02/06/2017   ALT 26 02/06/2017   PROT 7.1 02/06/2017   ALBUMIN 3.7 02/06/2017   CALCIUM 9.4 02/06/2017   GFR 148.96 02/06/2017   Lab Results  Component Value Date   CHOL 174 02/06/2017   Lab Results  Component Value Date   HDL 30.50 (L) 02/06/2017   Lab Results  Component Value Date   LDLCALC 105 (H) 10/31/2016   Lab Results  Component Value Date   TRIG 250.0 (H) 02/06/2017   Lab Results  Component Value Date   CHOLHDL 6 02/06/2017   Lab Results  Component Value Date   HGBA1C 5.9 02/06/2017       Assessment & Plan:   Problem List Items Addressed This Visit    Hyperlipidemia    Encouraged heart healthy diet, increase exercise, avoid trans fats, consider a krill oil cap daily.      Relevant Orders   Lipid panel (Completed)   Incomplete quadriplegia at C5-6 level [facility])    Struggling with worries regarding her ongoing care and ability to stay in her home as her sister's health fades. She is encouraged to start estate planning and look at sites they might to transfer to to receive       Essential hypertension    Well controlled, no changes to meds. Encouraged heart healthy diet such as the DASH diet and exercise as tolerated.       Relevant Orders   CBC (Completed)   Comprehensive metabolic panel (Completed)   TSH (Completed)  Hyperglycemia - Primary    minimize simple carbs. Increase exercise as tolerated.       Relevant Orders   Hemoglobin A1c (Completed)   Obesity    Encouraged DASH diet, decrease po intake and increase exercise as tolerated. Needs 7-8 hours of sleep nightly. Avoid trans fats, eat small, frequent meals every 4-5 hours with lean proteins, complex carbs and healthy fats. Minimize simple carbs      Depression    As her health and her  sister's (care providers) health decline she is getting depressed about how they will manage and she agrees to a trial of Fluoxetine to see if that is helpful. Will start with 10 mg and titrate up to 20 mg as tolerated      Relevant Medications   FLUoxetine (PROZAC) 20 MG tablet      I am having Ms. Celestine start on FLUoxetine. I am also having her maintain her Multiple Vitamins-Calcium (ONE-A-DAY WOMENS FORMULA PO), oxybutynin, clobetasol, DIGESTIVE ADVANTAGE, imiquimod, methylcellulose, docusate sodium, metroNIDAZOLE, mupirocin ointment, FIBER PO, atorvastatin, fluconazole, potassium chloride SA, and furosemide.  Meds ordered this encounter  Medications  . FLUoxetine (PROZAC) 20 MG tablet    Sig: 1/2 tab po daily then increase to 1 tab po daily    Dispense:  30 tablet    Refill:  3     Penni Homans, MD

## 2017-02-13 NOTE — Assessment & Plan Note (Signed)
As her health and her sister's (care providers) health decline she is getting depressed about how they will manage and she agrees to a trial of Fluoxetine to see if that is helpful. Will start with 10 mg and titrate up to 20 mg as tolerated

## 2017-02-13 NOTE — Assessment & Plan Note (Signed)
Encouraged DASH diet, decrease po intake and increase exercise as tolerated. Needs 7-8 hours of sleep nightly. Avoid trans fats, eat small, frequent meals every 4-5 hours with lean proteins, complex carbs and healthy fats. Minimize simple carbs 

## 2017-02-13 NOTE — Assessment & Plan Note (Signed)
minimize simple carbs. Increase exercise as tolerated.  

## 2017-02-27 ENCOUNTER — Telehealth: Payer: Self-pay | Admitting: *Deleted

## 2017-02-27 NOTE — Telephone Encounter (Signed)
Received Physician Orders from [facility] for [facility] attestation; forwarded to provider/SLS 09/17

## 2017-04-06 ENCOUNTER — Ambulatory Visit (INDEPENDENT_AMBULATORY_CARE_PROVIDER_SITE_OTHER): Payer: Medicare Other | Admitting: Family Medicine

## 2017-04-06 ENCOUNTER — Ambulatory Visit (HOSPITAL_BASED_OUTPATIENT_CLINIC_OR_DEPARTMENT_OTHER)
Admission: RE | Admit: 2017-04-06 | Discharge: 2017-04-06 | Disposition: A | Discharge status: Home / Self Care | Payer: Medicare Other | Source: Ambulatory Visit | Attending: Family Medicine | Admitting: Family Medicine

## 2017-04-06 DIAGNOSIS — R739 Hyperglycemia, unspecified: Secondary | ICD-10-CM

## 2017-04-06 DIAGNOSIS — M25561 Pain in right knee: Secondary | ICD-10-CM

## 2017-04-06 DIAGNOSIS — M25551 Pain in right hip: Secondary | ICD-10-CM | POA: Diagnosis not present

## 2017-04-06 DIAGNOSIS — M1611 Unilateral primary osteoarthritis, right hip: Secondary | ICD-10-CM | POA: Diagnosis not present

## 2017-04-06 DIAGNOSIS — R252 Cramp and spasm: Secondary | ICD-10-CM

## 2017-04-06 DIAGNOSIS — M1711 Unilateral primary osteoarthritis, right knee: Secondary | ICD-10-CM | POA: Insufficient documentation

## 2017-04-06 DIAGNOSIS — I10 Essential (primary) hypertension: Secondary | ICD-10-CM | POA: Diagnosis not present

## 2017-04-06 DIAGNOSIS — R51 Headache: Secondary | ICD-10-CM | POA: Diagnosis not present

## 2017-04-06 DIAGNOSIS — Z23 Encounter for immunization: Secondary | ICD-10-CM | POA: Diagnosis not present

## 2017-04-06 DIAGNOSIS — G8254 Quadriplegia, C5-C7 incomplete: Secondary | ICD-10-CM

## 2017-04-06 DIAGNOSIS — M8580 Other specified disorders of bone density and structure, unspecified site: Secondary | ICD-10-CM | POA: Diagnosis not present

## 2017-04-06 DIAGNOSIS — R519 Headache, unspecified: Secondary | ICD-10-CM

## 2017-04-06 MED ORDER — POTASSIUM CHLORIDE CRYS ER 20 MEQ PO TBCR: 40.0000 meq | EXTENDED_RELEASE_TABLET | Freq: Two times a day (BID) | ORAL | 1 refills | Status: DC

## 2017-04-06 MED ORDER — BACLOFEN 20 MG PO TABS: ORAL_TABLET | ORAL | 3 refills | Status: DC

## 2017-04-06 MED ORDER — FLUOXETINE HCL 20 MG PO TABS: 20.0000 mg | ORAL_TABLET | Freq: Every day | ORAL | 3 refills | Status: DC

## 2017-04-06 MED ORDER — FUROSEMIDE 20 MG PO TABS: 20.0000 mg | ORAL_TABLET | Freq: Two times a day (BID) | ORAL | 1 refills | Status: DC

## 2017-04-06 NOTE — Progress Notes (Signed)
Subjective:  I acted as a Education administrator for Dr. Charlett Blake. [place], [place]  Patient ID: [name], female    DOB: [birth year], 66 y.o.   MRN: [id number]  No chief complaint on file.   HPI  Patient is in today for a 2 month follow up she feels well today but she is noting some increasing pain and jerking in her right leg. She still has partial sensation in right leg and has been noting right hip and right knee pain increasing. No falls or trauma. She notes she uses Aleve 220 mg helps for a time.she takes roughly 6 in a week. She is having more trouble with twitching and jerking in her right leg that is keeping her from resting comfortably. She also notes some occasional right sided headache. No associated symptoms. Denies CP/palp/SOB/congestion/fevers/GI or GU c/o. Taking meds as prescribed  Patient Care Team: Mosie Lukes, MD as PCP - General (Family Medicine) Irine Seal, MD as Attending Physician (Urology) Clent Jacks, MD as Consulting Physician (Ophthalmology) Laurance Flatten, MD as Consulting Physician (Orthopedic Surgery) Cheryll Cockayne, MD as Consulting Physician (Surgical Oncology) Ronald Lobo, MD as Consulting Physician (Gastroenterology)   Past Medical History:  Diagnosis Date  . Acute bronchitis 05/16/2015  . Arthritis   . Bowel dysfunction    constipation and fecal retention  . Cerumen impaction 10/11/2015  . Constipation 01/25/2016  . Contusion 05/18/2014   Of foot  . Depression 02/13/2017  . Headache 04/09/2017  . History of kidney stones   . Hyperglycemia 05/18/2015  . Hyperlipidemia   . Hypertension   . Incomplete quadriplegia at C5-6 level ([place]) 07/24/2008   Qualifier: Diagnosis of  By: Wynona Luna Requires bowel regimen including enemas and indwelling foley cath Has had tendency towards pressure sores in past. Had a tear with a transfer in Hospital in past for surgery and developed a sore at that time.   . Medicare annual wellness visit, subsequent 11/02/2014   Follows with [facility] Declines colonoscopy  No h/o abnormal pap, declines further paps  . Mitral valve disorder 12/01/2012  . Neurogenic bladder   . Obesity   . Obesity 11/01/2016  . Obstructive sleep apnea 01/31/2016  . Pain in joint, shoulder region 12/01/2012   Long history B/l Follows with [facility] Hassell Done [facility]  . Postmenopausal bleeding 12/17/2012  . Preventative health care 06/26/2013  . Quadriplegia ([place]) 1981   Secondary to ruptured AVM C5-C6  . Sacral pressure ulcer 12/01/2012   H/o Follows with Dr Tomi Likens of Dermatology  . Skin cancer 11/01/2016   2 BCC RLE excised by Dr Wilhemina Bonito 2018 [place] LLE excised by Dr Wilhemina Bonito 2018  . Vaginitis and vulvovaginitis 12/17/2012    Past Surgical History:  Procedure Laterality Date  . COLOSTOMY  02/22/2016  . HERNIA REPAIR  3474   umbilical hernia repair  . LAMINECTOMY  1981   AVM  . [place]   left shoulder  . SHOULDER ARTHROSCOPY  2008   right  . TOTAL SHOULDER REPLACEMENT  2001   left  . TRANSFER TENDON HAND  1982, 1983   to right hand  . VENTRAL HERNIA REPAIR  08/17/2010   Postop ileus (Dr. Redmond Pulling)    Family History  Problem Relation Age of Onset  . Cancer Mother        breast & melanoma  . Hypertension Mother   . Heart disease Father  CAD, MI, AFib  . Cancer Sister        melanoma  . Cancer Maternal Grandfather        melanoma  . Stroke Paternal Grandmother   . Heart disease Paternal Grandfather   . Cancer Maternal Aunt        breast  . Cancer Paternal Aunt        breast  . Cancer Paternal Uncle        breast  . Cancer Paternal Aunt        breast    Social History   Social History  . Marital status: Single    Spouse name: N/A  . Number of children: N/A  . Years of education: N/A   Occupational History  . Disabled     math teacher   Social History Main Topics  . Smoking status: Never Smoker  . Smokeless tobacco: Never Used  . Alcohol use No  . Drug  use: No  . Sexual activity: Not on file     Comment: lives with sister and mother, no major dietary restrictions   Other Topics Concern  . Not on file   Social History Narrative   Sister is primary caregiver    Outpatient Medications Prior to Visit  Medication Sig Dispense Refill  . clobetasol (TEMOVATE) 0.05 % external solution Apply topically.    . docusate sodium (COLACE) 100 MG capsule Take 1 tablet by mouth daily.    Marland Kitchen FIBER PO Take 1 capsule by mouth daily.    . methylcellulose (FIBER THERAPY) oral powder Take 1 capsule by mouth daily.    . metroNIDAZOLE (METROCREAM) 0.75 % cream Apply topically 2 (two) times daily.    . Multiple Vitamins-Calcium (ONE-A-DAY WOMENS FORMULA PO) Take 1 tablet by mouth daily.      . mupirocin ointment (BACTROBAN) 2 % Place 1 application into the nose 2 (two) times daily. 22 g 1  . oxybutynin (DITROPAN) 5 MG tablet Take 5 mg by mouth every morning.      . Probiotic Product (DIGESTIVE ADVANTAGE) CAPS Take 1 tablet by mouth daily.    . baclofen (LIORESAL) 20 MG tablet TAKE 1/2 TO 1 TABLET BY MOUTH 3 TIMES DAILY 90 each 0  . FLUoxetine (PROZAC) 20 MG tablet 1/2 tab po daily then increase to 1 tab po daily 30 tablet 3  . furosemide (LASIX) 20 MG tablet TAKE 1 TABLET BY MOUTH TWICE DAILY 180 tablet 0  . potassium chloride SA (K-DUR,KLOR-CON) 20 MEQ tablet TAKE 2 TABLETS BY MOUTH TWICE DAILY 360 tablet 0  . atorvastatin (LIPITOR) 10 MG tablet Take 1/2 tablet by mouth every other day. (Patient not taking: Reported on 04/06/2017) 10 tablet 5  . fluconazole (DIFLUCAN) 150 MG tablet Take 1 tablet (150 mg total) by mouth once a week. (Patient not taking: Reported on 02/06/2017) 2 tablet 0  . imiquimod (ALDARA) 5 % cream Apply 1 g topically daily.     No facility-administered medications prior to visit.     Allergies  Allergen Reactions  . Adhesive [Tape] Other (See Comments)    Skin Irritant.    Review of Systems  Constitutional: Negative for fever and  malaise/fatigue.  HENT: Negative for congestion.   Eyes: Negative for blurred vision.  Respiratory: Negative for cough and shortness of breath.   Cardiovascular: Negative for chest pain, palpitations and leg swelling.  Gastrointestinal: Negative for vomiting.  Musculoskeletal: Positive for joint pain and myalgias. Negative for back pain.  Skin: Negative for  rash.  Neurological: Positive for sensory change, focal weakness and headaches. Negative for loss of consciousness.       Objective:    Physical Exam  Constitutional: She is oriented to person, place, and time. She appears well-developed and well-nourished. No distress.  HENT:  Head: Normocephalic and atraumatic.  Eyes: Conjunctivae are normal.  Neck: Normal range of motion. No thyromegaly present.  Cardiovascular: Normal rate and regular rhythm.   Pulmonary/Chest: Effort normal and breath sounds normal. She has no wheezes.  Abdominal: Soft. Bowel sounds are normal. There is no tenderness.  Musculoskeletal: Normal range of motion. She exhibits no edema or deformity.  Neurological: She is alert and oriented to person, place, and time.  Incomplete quadriplegic.   Skin: Skin is warm and dry. She is not diaphoretic.  Psychiatric: She has a normal mood and affect.    There were no vitals taken for this visit. Wt Readings from Last 3 Encounters:  10/31/16 221 lb (100.2 kg)  05/19/16 221 lb (100.2 kg)  01/25/16 220 lb (99.8 kg)   BP Readings from Last 3 Encounters:  02/06/17 132/68  10/31/16 (!) 159/92  06/21/16 131/65     Immunization History  Administered Date(s) Administered  . H1N1 07/24/2008  . Influenza Split 04/05/2011, 04/18/2012  . Influenza Whole 06/03/2008, 03/31/2009, 04/01/2010  . Influenza,inj,Quad PF,6+ Mos 03/20/2013, 03/03/2014, 05/18/2015, 04/20/2016, 04/06/2017  . Pneumococcal Conjugate-13 06/24/2013  . Pneumococcal Polysaccharide-23 12/07/2005  . Td 06/03/2008  . Zoster 11/26/2012    Health  Maintenance  Topic Date Due  . HIV Screening  10/30/1967  . MAMMOGRAM  04/18/2018  . Fecal DNA (Cologuard)  05/23/2018  . TETANUS/TDAP  06/03/2018  . PAP SMEAR  05/19/2019  . INFLUENZA VACCINE  Completed  . Hepatitis C Screening  Completed    Lab Results  Component Value Date   WBC 7.2 02/06/2017   HGB 13.6 02/06/2017   HCT 41.3 02/06/2017   PLT 226.0 02/06/2017   GLUCOSE 77 02/06/2017   CHOL 174 02/06/2017   TRIG 250.0 (H) 02/06/2017   HDL 30.50 (L) 02/06/2017   LDLDIRECT 105.0 02/06/2017   LDLCALC 105 (H) 10/31/2016   ALT 26 02/06/2017   AST 20 02/06/2017   NA 138 02/06/2017   K 4.5 02/06/2017   CL 103 02/06/2017   CREATININE 0.45 02/06/2017   BUN 8 02/06/2017   CO2 30 02/06/2017   TSH 2.34 02/06/2017   HGBA1C 5.9 02/06/2017    Lab Results  Component Value Date   TSH 2.34 02/06/2017   Lab Results  Component Value Date   WBC 7.2 02/06/2017   HGB 13.6 02/06/2017   HCT 41.3 02/06/2017   MCV 82.5 02/06/2017   PLT 226.0 02/06/2017   Lab Results  Component Value Date   NA 138 02/06/2017   K 4.5 02/06/2017   CO2 30 02/06/2017   GLUCOSE 77 02/06/2017   BUN 8 02/06/2017   CREATININE 0.45 02/06/2017   BILITOT 0.3 02/06/2017   ALKPHOS 80 02/06/2017   AST 20 02/06/2017   ALT 26 02/06/2017   PROT 7.1 02/06/2017   ALBUMIN 3.7 02/06/2017   CALCIUM 9.4 02/06/2017   GFR 148.96 02/06/2017   Lab Results  Component Value Date   CHOL 174 02/06/2017   Lab Results  Component Value Date   HDL 30.50 (L) 02/06/2017   Lab Results  Component Value Date   LDLCALC 105 (H) 10/31/2016   Lab Results  Component Value Date   TRIG 250.0 (H) 02/06/2017   Lab  Results  Component Value Date   CHOLHDL 6 02/06/2017   Lab Results  Component Value Date   HGBA1C 5.9 02/06/2017         Assessment & Plan:   Problem List Items Addressed This Visit    Incomplete quadriplegia at C5-6 level ([place])    Referred to neurology due to worsening twitching in right leg       Relevant Orders   Ambulatory referral to Neurology   Essential hypertension    Improved with recheck. no changes to meds. Encouraged heart healthy diet such as the DASH diet and exercise as tolerated.       Relevant Medications   furosemide (LASIX) 20 MG tablet   Hyperglycemia     minimize simple carbs. Increase exercise as tolerated.       Right knee pain    Xray unremarkable with right pain she is also noting an increasing in twitching and jerking in her leg it is making it hard for her to rest have referred to neurology for a work up due to her worsening symptoms.       Relevant Orders   DG Knee 1-2 Views Right (Completed)   Right hip pain    Xray shows some mild arthritis but no acute concerns. Encouraged moist heat and gentle stretching as tolerated.       Relevant Orders   DG HIP UNILAT WITH PELVIS 2-3 VIEWS RIGHT (Completed)   Headache    She describes right sided dull headaches intermittently. Encouraged increased hydration, 64 ounces of clear fluids daily. Minimize alcohol and caffeine. Eat small frequent meals with lean proteins and complex carbs. Avoid high and low blood sugars. Get adequate sleep, 7-8 hours a night. Needs exercise daily preferably in the morning.      Relevant Medications   baclofen (LIORESAL) 20 MG tablet   FLUoxetine (PROZAC) 20 MG tablet    Other Visit Diagnoses    Needs flu shot    -  Primary   Relevant Orders   Flu Vaccine QUAD 6+ mos PF IM (Fluarix Quad PF) (Completed)   Muscle cramp       Relevant Orders   Magnesium (Completed)      I have discontinued Ms. Yandell's imiquimod and fluconazole. I have also changed her potassium chloride SA, furosemide, and FLUoxetine. Additionally, I am having her maintain her Multiple Vitamins-Calcium (ONE-A-DAY WOMENS FORMULA PO), oxybutynin, clobetasol, DIGESTIVE ADVANTAGE, methylcellulose, docusate sodium, metroNIDAZOLE, mupirocin ointment, FIBER PO, atorvastatin, and baclofen.  Meds ordered this  encounter  Medications  . potassium chloride SA (K-DUR,KLOR-CON) 20 MEQ tablet    Sig: Take 2 tablets (40 mEq total) by mouth 2 (two) times daily.    Dispense:  360 tablet    Refill:  1  . baclofen (LIORESAL) 20 MG tablet    Sig: TAKE 1/2 TO 1 TABLET BY MOUTH 3 TIMES DAILY    Dispense:  90 each    Refill:  3  . furosemide (LASIX) 20 MG tablet    Sig: Take 1 tablet (20 mg total) by mouth 2 (two) times daily.    Dispense:  180 tablet    Refill:  1  . FLUoxetine (PROZAC) 20 MG tablet    Sig: Take 1 tablet (20 mg total) by mouth daily. 1/2 tab po daily then increase to 1 tab po daily    Dispense:  30 tablet    Refill:  3    CMA served as scribe during this visit. History, Physical and Plan  performed by medical provider. Documentation and orders reviewed and attested to.  Penni Homans, MD

## 2017-04-06 NOTE — Assessment & Plan Note (Signed)
Referred to neurology due to worsening twitching in right leg

## 2017-04-06 NOTE — Patient Instructions (Addendum)
Tylenol/Acetaminoph EX 1-2 tabs twice, (max dose in 24 hours is 3000mg )  Headache and Arthritis If you have arthritis and headaches, it is possible the two problems are related. Some headaches can be caused by arthritis in your neck (cervicogenic headaches). Pain medicine is another possible link between arthritis and headache. If you take a lot of over-the-counter medicines for arthritis pain, you may develop the type of headache that can happen when you stop taking your over-the-counter pain reliever or lower your dose too quickly (rebound headache). What types of arthritis can cause a headache? There are two types of arthritis, rheumatoid arthritis and osteoarthritis. Both types of arthritis can cause headaches.  Rheumatoid arthritis (RA) is an autoimmune disease that causes inflammation of your joints. When you have RA, your body's defense system (immune system) attacks the joints of your body and causes inflammation. This can lead to deformity over time.  Osteoarthritis (OA) is wear and tear caused by joint use over time. Osteoarthritis is not an inflammatory disease.  Both OA and RA can cause neck pain that is felt in the head. When the pain is felt in a different location than it originates, it is called radiating or referred pain. This pain is usually felt in the back of the head. How are headaches and arthritis related? RA can affect any joint in the body, including the joints between the bones of the neck (cervical vertebrae). The neck joints most commonly affected by RA are the top two joints, between the first and second cervical vertebra. Inflammation in these joints may be felt as neck pain and head pain. OA of the neck may be caused by gradual wear and tear or by a neck injury. Neck vertebrae may develop calcium deposits in the areas where muscle attach. Wear and tear of the vertebra may cause pressure on the nerves that leave the spinal cord. These changes can cause referred pain that  may be felt as a headache. How are headaches associated with arthritis diagnosed?  Your health care provider may diagnose headache caused by RA if you have inflammation of vertebrae in your neck. You may have: ? Blood tests to measure how much inflammation you have. ? Imaging studies of your neck (MRI) to check for inflammation of cervical vertebrae.  Your health care provider may diagnose headache caused by OA if an X-ray shows: ? Calcium deposits. ? Bone spurs. ? Narrowing of the space between neck vertebrae.  Your health care provider may diagnose rebound headache if you have a history of using over-the-counter pain relievers frequently. When should I seek care for my headaches? Call your health care provider if:  You have more than three headaches per week.  You take an over-the-counter pain reliever almost every day.  Your headaches are getting worse and happening more often.  You have headache with fever.  You have headache with numbness, weakness, or dizziness.  You have headache with nausea or vomiting.  What are my treatment options?  If you have headache caused by RA, treatment may include: ? Over-the-counter or prescription-strength anti-inflammatory medicines. ? Disease-modifying antirheumatic drugs (DMARDs). These medicines slow or stop the progression of RA.  If you have headache caused by OA, treatment may include: ? Over-the-counter pain medicines. ? Heat or massage. ? Physical therapy.  If you have rebound headaches: ? They will usually go away within several days of stopping the medicine that caused them. ? You may be able to gradually reduce the amount of medicines you take  to prevent headache. ? Ask your health care provider if you can take another type of medicine instead. This information is not intended to replace advice given to you by your health care provider. Make sure you discuss any questions you have with your health care provider. Document  Released: 08/20/2003 Document Revised: 11/05/2015 Document Reviewed: 09/02/2013 Elsevier Interactive Patient Education  2018 [employer].

## 2017-04-07 ENCOUNTER — Ambulatory Visit: Payer: Medicare Other | Admitting: Family Medicine

## 2017-04-07 ENCOUNTER — Encounter: Payer: Self-pay | Admitting: Family Medicine

## 2017-04-07 LAB — MAGNESIUM: Magnesium: 2 mg/dL (ref 1.5–2.5)

## 2017-04-09 ENCOUNTER — Encounter: Payer: Self-pay | Admitting: Family Medicine

## 2017-04-09 DIAGNOSIS — R51 Headache: Secondary | ICD-10-CM

## 2017-04-09 DIAGNOSIS — M25551 Pain in right hip: Secondary | ICD-10-CM | POA: Insufficient documentation

## 2017-04-09 DIAGNOSIS — M25561 Pain in right knee: Secondary | ICD-10-CM | POA: Insufficient documentation

## 2017-04-09 DIAGNOSIS — R519 Headache, unspecified: Secondary | ICD-10-CM

## 2017-04-09 HISTORY — DX: Headache, unspecified: R51.9

## 2017-04-09 NOTE — Assessment & Plan Note (Signed)
Xray unremarkable with right pain she is also noting an increasing in twitching and jerking in her leg it is making it hard for her to rest have referred to neurology for a work up due to her worsening symptoms.

## 2017-04-09 NOTE — Assessment & Plan Note (Signed)
Improved with recheck. no changes to meds. Encouraged heart healthy diet such as the DASH diet and exercise as tolerated.

## 2017-04-09 NOTE — Assessment & Plan Note (Signed)
She describes right sided dull headaches intermittently. Encouraged increased hydration, 64 ounces of clear fluids daily. Minimize alcohol and caffeine. Eat small frequent meals with lean proteins and complex carbs. Avoid high and low blood sugars. Get adequate sleep, 7-8 hours a night. Needs exercise daily preferably in the morning.

## 2017-04-09 NOTE — Assessment & Plan Note (Signed)
Xray shows some mild arthritis but no acute concerns. Encouraged moist heat and gentle stretching as tolerated.

## 2017-04-09 NOTE — Assessment & Plan Note (Signed)
minimize simple carbs. Increase exercise as tolerated.  

## 2017-04-10 ENCOUNTER — Ambulatory Visit: Payer: Medicare Other | Admitting: Family Medicine

## 2017-04-10 MED ORDER — ROSUVASTATIN CALCIUM 5 MG PO TABS: 5.0000 mg | ORAL_TABLET | ORAL | 3 refills | Status: DC

## 2017-04-10 NOTE — Telephone Encounter (Signed)
Remove the Atorvastatin from her list and start Crestor 5 mg tabs, 1 tab po qod on sig but have her start taking it just 2 days a week, Tuesday and Saturday to make sure she tolerates. Disp #15 with 3 rf.

## 2017-04-13 ENCOUNTER — Other Ambulatory Visit: Payer: Self-pay | Admitting: Family Medicine

## 2017-04-13 ENCOUNTER — Encounter: Payer: Self-pay | Admitting: Family Medicine

## 2017-04-13 DIAGNOSIS — G8250 Quadriplegia, unspecified: Principal | ICD-10-CM

## 2017-04-13 DIAGNOSIS — IMO0002 Reserved for concepts with insufficient information to code with codable children: Secondary | ICD-10-CM

## 2017-04-14 ENCOUNTER — Telehealth: Payer: Self-pay | Admitting: Family

## 2017-04-14 NOTE — Telephone Encounter (Signed)
Notified pt and she voices understanding. 

## 2017-04-14 NOTE — Telephone Encounter (Signed)
Please contact pt and let her know that I reviewed her case with Dr. Charlett Blake.  She went ahead and placed a referral to Dr. Christella Noa and said that if they don't get answers from him then she would recommend neurology referral at that time.

## 2017-04-14 NOTE — Telephone Encounter (Signed)
-----   Message from Mosie Lukes, MD sent at 04/13/2017  8:50 PM EDT -----  Thanks for helping. I cannot figure out how to send her a note directly, could you please let her know I went ahead and placed a referral to Dr Christella Noa she is likely right she will get an appt fastest with him because she is an established patient. If we do not get answers then we can try neurology. Dr B ----- Message ----- From: [name], NP Sent: 04/13/2017   8:04 PM To: Mosie Lukes, MD  Please see patient mychart message.  I responded to the patient but it is still requiring your review please.  Couldn't get it back on your desktop. Thanks!

## 2017-04-24 ENCOUNTER — Telehealth: Payer: Self-pay | Admitting: Family Medicine

## 2017-04-24 NOTE — Telephone Encounter (Signed)
Pt called request [name] schedule MRI lower spine for leg pain since neurosurgeon in [place] unable to see her today he canceled due to emergency sx. New appt 05/09/17.  Neuro says [facility] and [facility] would be ideal so pt does not have to bring a disc with her to appt. Pt says she is in a wheelchair and would prefer [facility] so her transportation person would not have to do so much walking but will go wherever can get mri done.

## 2017-04-26 ENCOUNTER — Encounter: Payer: Self-pay | Admitting: Family Medicine

## 2017-04-26 ENCOUNTER — Other Ambulatory Visit: Payer: Self-pay | Admitting: Family Medicine

## 2017-04-26 DIAGNOSIS — M5417 Radiculopathy, lumbosacral region: Secondary | ICD-10-CM

## 2017-04-26 DIAGNOSIS — G8250 Quadriplegia, unspecified: Secondary | ICD-10-CM

## 2017-04-26 DIAGNOSIS — M25551 Pain in right hip: Secondary | ICD-10-CM

## 2017-04-26 NOTE — Telephone Encounter (Addendum)
Relation to pt: self  Call back number: [phone]  Reason for call:  Patient checking on the status of my chart message,please advise. Please reference 04/24/17 telephone note.

## 2017-04-27 NOTE — Telephone Encounter (Signed)
Orders placed.

## 2017-04-27 NOTE — Telephone Encounter (Signed)
Please advise 

## 2017-04-27 NOTE — Telephone Encounter (Signed)
I ordered yesterday, there is a note floating around somewhere

## 2017-05-06 ENCOUNTER — Ambulatory Visit (HOSPITAL_COMMUNITY)
Admission: RE | Admit: 2017-05-06 | Discharge: 2017-05-06 | Disposition: A | Discharge status: Home / Self Care | Payer: Medicare Other | Source: Ambulatory Visit | Attending: Family Medicine | Admitting: Family Medicine

## 2017-05-06 DIAGNOSIS — M2548 Effusion, other site: Secondary | ICD-10-CM | POA: Diagnosis not present

## 2017-05-06 DIAGNOSIS — M47816 Spondylosis without myelopathy or radiculopathy, lumbar region: Secondary | ICD-10-CM | POA: Insufficient documentation

## 2017-05-06 DIAGNOSIS — G8250 Quadriplegia, unspecified: Secondary | ICD-10-CM | POA: Insufficient documentation

## 2017-05-06 DIAGNOSIS — M25551 Pain in right hip: Secondary | ICD-10-CM | POA: Diagnosis present

## 2017-05-06 DIAGNOSIS — M5417 Radiculopathy, lumbosacral region: Secondary | ICD-10-CM | POA: Diagnosis present

## 2017-06-13 HISTORY — PX: BASAL CELL CARCINOMA EXCISION: SHX1214

## 2017-07-07 NOTE — Progress Notes (Deleted)
Subjective:   [name] is a 67 y.o. female who presents for Medicare Annual (Subsequent) preventive examination. .  Review of Systems: No ROS.  Medicare Wellness Visit. Additional risk factors are reflected in the social history.   Sleep patterns:   Home Safety/Smoke Alarms: Feels safe in home. Smoke alarms in place. Live with her sister [name]. [name] is primary caregiver.    Female:   Pap-  Last Dec 2017 with Dr.Wrenn/ normal per pt    Mammo-   Dexa scan-        [facility]- Cologuard 05/24/15. Negative Dentist- Dr.Fisher every 6 mo Eye- Dr.Groat every 2 yrs Objective:     Vitals: There were no vitals taken for this visit.  There is no height or weight on file to calculate BMI.  Advanced Directives 06/21/2016 10/21/2014 08/27/2014  Does Patient Have a Medical Advance Directive? Yes Yes Yes  Type of Paramedic of [place];Living will [place];Living will [place]  Does patient want to make changes to medical advance directive? - Yes - information given No - Patient declined  Copy of [place] in Chart? No - copy requested Yes No - copy requested    Tobacco Social History   Tobacco Use  Smoking Status Never Smoker  Smokeless Tobacco Never Used     Counseling given: Not Answered   Clinical Intake:                       Past Medical History:  Diagnosis Date  . Acute bronchitis 05/16/2015  . Arthritis   . Bowel dysfunction    constipation and fecal retention  . Cerumen impaction 10/11/2015  . Constipation 01/25/2016  . Contusion 05/18/2014   Of foot  . Depression 02/13/2017  . Headache 04/09/2017  . History of kidney stones   . Hyperglycemia 05/18/2015  . Hyperlipidemia   . Hypertension   . Incomplete quadriplegia at C5-6 level ([place]) 07/24/2008   Qualifier: Diagnosis of  By: Wynona Luna Requires bowel regimen including enemas and indwelling foley cath Has had tendency towards  pressure sores in past. Had a tear with a transfer in Hospital in past for surgery and developed a sore at that time.   . Medicare annual wellness visit, subsequent 11/02/2014   Follows with [facility] Declines colonoscopy  No h/o abnormal pap, declines further paps  . Mitral valve disorder 12/01/2012  . Neurogenic bladder   . Obesity   . Obesity 11/01/2016  . Obstructive sleep apnea 01/31/2016  . Pain in joint, shoulder region 12/01/2012   Long history B/l Follows with [facility] Hassell Done [facility]  . Postmenopausal bleeding 12/17/2012  . Preventative health care 06/26/2013  . Quadriplegia ([place]) 1981   Secondary to ruptured AVM C5-C6  . Sacral pressure ulcer 12/01/2012   H/o Follows with Dr Tomi Likens of Dermatology  . Skin cancer 11/01/2016   2 BCC RLE excised by Dr Wilhemina Bonito 2018 [place] LLE excised by Dr Wilhemina Bonito 2018  . Vaginitis and vulvovaginitis 12/17/2012   Past Surgical History:  Procedure Laterality Date  . COLOSTOMY  02/22/2016  . HERNIA REPAIR  0263   umbilical hernia repair  . LAMINECTOMY  1981   AVM  . [place]   left shoulder  . SHOULDER ARTHROSCOPY  2008   right  . TOTAL SHOULDER REPLACEMENT  2001   left  . TRANSFER  TENDON HAND  1982, 1983   to right hand  . VENTRAL HERNIA REPAIR  08/17/2010   Postop ileus (Dr. Redmond Pulling)   Family History  Problem Relation Age of Onset  . Cancer Mother        breast & melanoma  . Hypertension Mother   . Heart disease Father        CAD, MI, AFib  . Cancer Sister        melanoma  . Cancer Maternal Grandfather        melanoma  . Stroke Paternal Grandmother   . Heart disease Paternal Grandfather   . Cancer Maternal Aunt        breast  . Cancer Paternal Aunt        breast  . Cancer Paternal Uncle        breast  . Cancer Paternal Aunt        breast   Social History   Socioeconomic History  . Marital status: Single    Spouse name: Not on file  . Number of children: Not on file  . Years of  education: Not on file  . Highest education level: Not on file  Social Needs  . Financial resource strain: Not on file  . Food insecurity - worry: Not on file  . Food insecurity - inability: Not on file  . Transportation needs - medical: Not on file  . Transportation needs - non-medical: Not on file  Occupational History  . Occupation: Disabled    Comment: Music therapist  Tobacco Use  . Smoking status: Never Smoker  . Smokeless tobacco: Never Used  Substance and Sexual Activity  . Alcohol use: No  . Drug use: No  . Sexual activity: Not on file    Comment: lives with sister and mother, no major dietary restrictions  Other Topics Concern  . Not on file  Social History Narrative   Sister is primary caregiver    Outpatient Encounter Medications as of 07/10/2017  Medication Sig  . baclofen (LIORESAL) 20 MG tablet TAKE 1/2 TO 1 TABLET BY MOUTH 3 TIMES DAILY  . clobetasol (TEMOVATE) 0.05 % external solution Apply topically.  . docusate sodium (COLACE) 100 MG capsule Take 1 tablet by mouth daily.  Marland Kitchen FIBER PO Take 1 capsule by mouth daily.  Marland Kitchen FLUoxetine (PROZAC) 20 MG tablet Take 1 tablet (20 mg total) by mouth daily. 1/2 tab po daily then increase to 1 tab po daily  . furosemide (LASIX) 20 MG tablet Take 1 tablet (20 mg total) by mouth 2 (two) times daily.  . methylcellulose (FIBER THERAPY) oral powder Take 1 capsule by mouth daily.  . metroNIDAZOLE (METROCREAM) 0.75 % cream Apply topically 2 (two) times daily.  . Multiple Vitamins-Calcium (ONE-A-DAY WOMENS FORMULA PO) Take 1 tablet by mouth daily.    . mupirocin ointment (BACTROBAN) 2 % Place 1 application into the nose 2 (two) times daily.  Marland Kitchen oxybutynin (DITROPAN) 5 MG tablet Take 5 mg by mouth every morning.    . potassium chloride SA (K-DUR,KLOR-CON) 20 MEQ tablet Take 2 tablets (40 mEq total) by mouth 2 (two) times daily.  . Probiotic Product (DIGESTIVE ADVANTAGE) CAPS Take 1 tablet by mouth daily.  . rosuvastatin (CRESTOR) 5 MG  tablet Take 1 tablet (5 mg total) by mouth every other day.   No facility-administered encounter medications on file as of 07/10/2017.     Activities of Daily Living No flowsheet data found.  Patient Care Team: Mosie Lukes, MD  as PCP - General (Family Medicine) Irine Seal, MD as Attending Physician (Urology) Clent Jacks, MD as Consulting Physician (Ophthalmology) Laurance Flatten, MD as Consulting Physician (Orthopedic Surgery) Cheryll Cockayne, MD as Consulting Physician (Surgical Oncology) Ronald Lobo, MD as Consulting Physician (Gastroenterology)    Assessment:   This is a routine wellness examination for [facility]. Physical assessment deferred to PCP.  Exercise Activities and Dietary recommendations   Diet (meal preparation, eat out, water intake, caffeinated beverages, dairy products, fruits and vegetables): {Desc; diets:16563} Breakfast: Lunch:  Dinner:      Goals    None      Fall Risk Fall Risk  06/21/2016  Falls in the past year? No    Depression Screen PHQ 2/9 Scores 06/21/2016  PHQ - 2 Score 0     Cognitive Function MMSE - Mini Mental State Exam 06/21/2016  Orientation to time 5  Orientation to Place 5  Registration 3  Attention/ Calculation 5  Recall 3  Language- name 2 objects 2  Language- repeat 1  Language- follow 3 step command 3  Language- read & follow direction 1  Write a sentence 1  Copy design 1  Total score 30        Immunization History  Administered Date(s) Administered  . H1N1 07/24/2008  . Influenza Split 04/05/2011, 04/18/2012  . Influenza Whole 06/03/2008, 03/31/2009, 04/01/2010  . Influenza,inj,Quad PF,6+ Mos 03/20/2013, 03/03/2014, 05/18/2015, 04/20/2016, 04/06/2017  . Pneumococcal Conjugate-13 06/24/2013  . Pneumococcal Polysaccharide-23 12/07/2005  . Td 06/03/2008  . Zoster 11/26/2012    Screening Tests Health Maintenance  Topic Date Due  . HIV Screening  10/30/1965  . MAMMOGRAM  04/18/2018  . Fecal DNA (Cologuard)   05/23/2018  . TETANUS/TDAP  06/03/2018  . PAP SMEAR  05/19/2019  . INFLUENZA VACCINE  Completed  . Hepatitis C Screening  Completed      Plan:   ***   I have personally reviewed and noted the following in the patient's chart:   . Medical and social history . Use of alcohol, tobacco or illicit drugs  . Current medications and supplements . Functional ability and status . Nutritional status . Physical activity . Advanced directives . List of other physicians . Hospitalizations, surgeries, and ER visits in previous 12 months . Vitals . Screenings to include cognitive, depression, and falls . Referrals and appointments  In addition, I have reviewed and discussed with patient certain preventive protocols, quality metrics, and best practice recommendations. A written personalized care plan for preventive services as well as general preventive health recommendations were provided to patient.     Shela Nevin, [place]  07/07/2017

## 2017-07-10 ENCOUNTER — Ambulatory Visit: Payer: Medicare Other | Admitting: Family Medicine

## 2017-07-10 ENCOUNTER — Ambulatory Visit: Payer: Medicare Other | Admitting: *Deleted

## 2017-08-02 NOTE — Progress Notes (Signed)
Subjective:   [name] is a 67 y.o. female who presents for Medicare Annual (Subsequent) preventive examination.  Review of Systems: No ROS.  Medicare Wellness Visit. Additional risk factors are reflected in the social history.    Sleep patterns: wears CPAP Home Safety/Smoke Alarms: Feels safe in home. Smoke alarms in place.  Living environment; residence and Firearm Safety: Lives with sister [place] Safety/Bike Helmet: Wears seat belt.   Female:   Pap- per pt..last May 18 2017 -Dr.Wrenn-normal   Mammo-  Last 05/29/17-[facility]-normal  Dexa scan-  declines    CCS- Cologuard 05/24/15-normal Dentist- Dr.Fischer every 6 months. Next appt 09/18/17.    Objective:     Vitals: BP (!) 149/84 (BP Location: Right Arm, Patient Position: Sitting, Cuff Size: Large)   Pulse 62   SpO2 93%   There is no height or weight on file to calculate BMI.  Advanced Directives 08/07/2017 06/21/2016 10/21/2014 08/27/2014  Does Patient Have a Medical Advance Directive? Yes Yes Yes Yes  Type of Paramedic of [place];Living will [place];Living will [place];Living will [place]  Does patient want to make changes to medical advance directive? - - Yes - information given No - Patient declined  Copy of [place] in Chart? Yes No - copy requested Yes No - copy requested    Tobacco Social History   Tobacco Use  Smoking Status Never Smoker  Smokeless Tobacco Never Used     Counseling given: Not Answered   Clinical Intake: Pain : No/denies pain   Past Medical History:  Diagnosis Date  . Acute bronchitis 05/16/2015  . Arthritis   . Bowel dysfunction    constipation and fecal retention  . Cerumen impaction 10/11/2015  . Constipation 01/25/2016  . Contusion 05/18/2014   Of foot  . Depression 02/13/2017  . Headache 04/09/2017  . History of kidney stones   . Hyperglycemia 05/18/2015  . Hyperlipidemia   .  Hypertension   . Incomplete quadriplegia at C5-6 level ([place]) 07/24/2008   Qualifier: Diagnosis of  By: Wynona Luna Requires bowel regimen including enemas and indwelling foley cath Has had tendency towards pressure sores in past. Had a tear with a transfer in Hospital in past for surgery and developed a sore at that time.   . Medicare annual wellness visit, subsequent 11/02/2014   Follows with [facility] Declines colonoscopy  No h/o abnormal pap, declines further paps  . Mitral valve disorder 12/01/2012  . Neurogenic bladder   . Obesity   . Obesity 11/01/2016  . Obstructive sleep apnea 01/31/2016  . Pain in joint, shoulder region 12/01/2012   Long history B/l Follow[facility] Hassell [facility]  . Postmenopausal bleeding 12/17/2012  . Preventative health care 06/26/2013  . Quadriplegia ([place]) 1981   Secondary to ruptured AVM C5-C6  . Sacral pressure ulcer 12/01/2012   H/o Follows with Dr Tomi Likens of Dermatology  . Skin cancer 11/01/2016   2 [facility] RLE excised by Dr Wilhemina Bonito 2018 [place] LLE excised by Dr Wilhemina Bonito 2018  . Vaginitis and vulvovaginitis 12/17/2012   Past Surgical History:  Procedure Laterality Date  . COLOSTOMY  02/22/2016  . HERNIA REPAIR  9528   umbilical hernia repair  . LAMINECTOMY  1981   AVM  . [place]   left shoulder  . SHOULDER ARTHROSCOPY  2008   right  . TOTAL  SHOULDER REPLACEMENT  2001   left  . TRANSFER TENDON HAND  1982, 1983   to right hand  . VENTRAL HERNIA REPAIR  08/17/2010   Postop ileus (Dr. Redmond Pulling)   Family History  Problem Relation Age of Onset  . Cancer Mother        breast & melanoma  . Hypertension Mother   . Heart disease Father        CAD, MI, AFib  . Cancer Sister        melanoma  . Cancer Maternal Grandfather        melanoma  . Stroke Paternal Grandmother   . Heart disease Paternal Grandfather   . Cancer Maternal Aunt        breast  . Cancer Paternal Aunt        breast  . Cancer Paternal  Uncle        breast  . Cancer Paternal Aunt        breast   Social History   Socioeconomic History  . Marital status: Single    Spouse name: None  . Number of children: None  . Years of education: None  . Highest education level: None  Social Needs  . Financial resource strain: None  . Food insecurity - worry: None  . Food insecurity - inability: None  . Transportation needs - medical: None  . Transportation needs - non-medical: None  Occupational History  . Occupation: Disabled    Comment: Music therapist  Tobacco Use  . Smoking status: Never Smoker  . Smokeless tobacco: Never Used  Substance and Sexual Activity  . Alcohol use: No  . Drug use: No  . Sexual activity: None    Comment: lives with sister and mother, no major dietary restrictions  Other Topics Concern  . None  Social History Narrative   Sister is primary caregiver    Outpatient Encounter Medications as of 08/07/2017  Medication Sig  . baclofen (LIORESAL) 20 MG tablet TAKE 1/2 TO 1 TABLET BY MOUTH 3 TIMES DAILY  . clobetasol (TEMOVATE) 0.05 % external solution Apply topically.  . docusate sodium (COLACE) 100 MG capsule Take 1 tablet by mouth daily.  Marland Kitchen FIBER PO Take 1 capsule by mouth daily.  Marland Kitchen FLUoxetine (PROZAC) 20 MG tablet Take 1 tablet (20 mg total) by mouth daily. 1/2 tab po daily then increase to 1 tab po daily  . furosemide (LASIX) 20 MG tablet Take 1 tablet (20 mg total) by mouth 2 (two) times daily.  . methylcellulose (FIBER THERAPY) oral powder Take 1 capsule by mouth daily.  . metroNIDAZOLE (METROCREAM) 0.75 % cream Apply topically 2 (two) times daily.  . Multiple Vitamins-Calcium (ONE-A-DAY WOMENS FORMULA PO) Take 1 tablet by mouth daily.    . mupirocin ointment (BACTROBAN) 2 % Place 1 application into the nose 2 (two) times daily.  Marland Kitchen oxybutynin (DITROPAN) 5 MG tablet Take 5 mg by mouth every morning.    . potassium chloride SA (K-DUR,KLOR-CON) 20 MEQ tablet Take 2 tablets (40 mEq total) by mouth 2  (two) times daily.  . Probiotic Product (DIGESTIVE ADVANTAGE) CAPS Take 1 tablet by mouth daily.  . rosuvastatin (CRESTOR) 5 MG tablet Take 1 tablet (5 mg total) by mouth every other day.   No facility-administered encounter medications on file as of 08/07/2017.     Activities of Daily Living In your present state of health, do you have any difficulty performing the following activities: 08/07/2017  Hearing? N  Vision? N  Difficulty  concentrating or making decisions? N  Walking or climbing stairs? N  Dressing or bathing? N  Doing errands, shopping? N  Preparing Food and eating ? N  Using the Toilet? N  In the past six months, have you accidently leaked urine? Y  Comment Foley  Do you have problems with loss of bowel control? Y  Comment colostomy  Managing your Medications? N  Managing your Finances? N  Housekeeping or managing your Housekeeping? N  Some recent data might be hidden    Patient Care Team: Mosie Lukes, MD as PCP - General (Family Medicine) Irine Seal, MD as Attending Physician (Urology) Clent Jacks, MD as Consulting Physician (Ophthalmology) Laurance Flatten, MD as Consulting Physician (Orthopedic Surgery) Cheryll Cockayne, MD as Consulting Physician (Surgical Oncology) Ronald Lobo, MD as Consulting Physician (Gastroenterology) Danella Sensing, MD as Consulting Physician (Dermatology)    Assessment:   This is a routine wellness examination for [facility]. Physical assessment deferred to PCP.  Exercise Activities and Dietary recommendations Current Exercise Habits: The patient does not participate in regular exercise at present, Exercise limited by: neurologic condition(s)   Diet (meal preparation, eat out, water intake, caffeinated beverages, dairy products, fruits and vegetables): well balanced   Goals    None      Fall Risk Fall Risk  06/21/2016  Falls in the past year? No    Depression Screen PHQ 2/9 Scores 06/21/2016  PHQ - 2 Score 0     Cognitive  Function Ad8 score reviewed for issues:  Issues making decisions:no  Less interest in hobbies / activities:no  Repeats questions, stories (family complaining):no  Trouble using ordinary gadgets (microwave, computer, phone):no  Forgets the month or year: no  Remembering appts:no  Daily problems with thinking and/or memory:no Ad8 score is=0     MMSE - Mini Mental State Exam 06/21/2016  Orientation to time 5  Orientation to Place 5  Registration 3  Attention/ Calculation 5  Recall 3  Language- name 2 objects 2  Language- repeat 1  Language- follow 3 step command 3  Language- read & follow direction 1  Write a sentence 1  Copy design 1  Total score 30        Immunization History  Administered Date(s) Administered  . H1N1 07/24/2008  . Influenza Split 04/05/2011, 04/18/2012  . Influenza Whole 06/03/2008, 03/31/2009, 04/01/2010  . Influenza,inj,Quad PF,6+ Mos 03/20/2013, 03/03/2014, 05/18/2015, 04/20/2016, 04/06/2017  . Pneumococcal Conjugate-13 06/24/2013  . Pneumococcal Polysaccharide-23 12/07/2005  . Td 06/03/2008  . Zoster 11/26/2012    Screening Tests Health Maintenance  Topic Date Due  . HIV Screening  10/30/1965  . MAMMOGRAM  04/18/2018  . Fecal DNA (Cologuard)  05/23/2018  . TETANUS/TDAP  06/03/2018  . PAP SMEAR  05/19/2019  . INFLUENZA VACCINE  Completed  . Hepatitis C Screening  Completed      Plan:   Follow up with PCP as directed  Continue to eat heart healthy diet (full of fruits, vegetables, whole grains, lean protein, water--limit salt, fat, and sugar intake) and increase physical activity as tolerated.  Continue doing brain stimulating activities (puzzles, reading, adult coloring books, staying active) to keep memory sharp.   I have personally reviewed and noted the following in the patient's chart:   . Medical and social history . Use of alcohol, tobacco or illicit drugs  . Current medications and supplements . Functional ability and  status . Nutritional status . Physical activity . Advanced directives . List of other physicians . Hospitalizations, surgeries, and  ER visits in previous 12 months . Vitals . Screenings to include cognitive, depression, and falls . Referrals and appointments  In addition, I have reviewed and discussed with patient certain preventive protocols, quality metrics, and best practice recommendations. A written personalized care plan for preventive services as well as general preventive health recommendations were provided to patient.     Naaman Plummer [place], [place]  08/07/2017

## 2017-08-07 ENCOUNTER — Encounter: Payer: Self-pay | Admitting: Family Medicine

## 2017-08-07 ENCOUNTER — Ambulatory Visit (INDEPENDENT_AMBULATORY_CARE_PROVIDER_SITE_OTHER): Payer: Medicare Other | Admitting: Family Medicine

## 2017-08-07 ENCOUNTER — Ambulatory Visit (INDEPENDENT_AMBULATORY_CARE_PROVIDER_SITE_OTHER): Payer: Medicare Other | Admitting: *Deleted

## 2017-08-07 ENCOUNTER — Encounter: Payer: Self-pay | Admitting: *Deleted

## 2017-08-07 VITALS — BP 150/86 | HR 57 | Temp 98.5°F | Resp 18

## 2017-08-07 VITALS — BP 149/84 | HR 62

## 2017-08-07 DIAGNOSIS — R739 Hyperglycemia, unspecified: Secondary | ICD-10-CM | POA: Diagnosis not present

## 2017-08-07 DIAGNOSIS — I10 Essential (primary) hypertension: Secondary | ICD-10-CM

## 2017-08-07 DIAGNOSIS — E669 Obesity, unspecified: Secondary | ICD-10-CM | POA: Diagnosis not present

## 2017-08-07 DIAGNOSIS — Z0000 Encounter for general adult medical examination without abnormal findings: Secondary | ICD-10-CM

## 2017-08-07 DIAGNOSIS — G4730 Sleep apnea, unspecified: Secondary | ICD-10-CM | POA: Diagnosis not present

## 2017-08-07 DIAGNOSIS — M25551 Pain in right hip: Secondary | ICD-10-CM

## 2017-08-07 DIAGNOSIS — E782 Mixed hyperlipidemia: Secondary | ICD-10-CM | POA: Diagnosis not present

## 2017-08-07 DIAGNOSIS — G4700 Insomnia, unspecified: Secondary | ICD-10-CM

## 2017-08-07 MED ORDER — BACLOFEN 20 MG PO TABS: 10.0000 mg | ORAL_TABLET | Freq: Four times a day (QID) | ORAL | 3 refills | Status: DC | PRN

## 2017-08-07 NOTE — Patient Instructions (Signed)
If leg pain worsens consider course of Gabapentin Sleep Apnea Sleep apnea is a condition in which breathing pauses or becomes shallow during sleep. Episodes of sleep apnea usually last 10 seconds or longer, and they may occur as many as 20 times an hour. Sleep apnea disrupts your sleep and keeps your body from getting the rest that it needs. This condition can increase your risk of certain health problems, including:  Heart attack.  Stroke.  Obesity.  Diabetes.  Heart failure.  Irregular heartbeat.  There are three kinds of sleep apnea:  Obstructive sleep apnea. This kind is caused by a blocked or collapsed airway.  Central sleep apnea. This kind happens when the part of the brain that controls breathing does not send the correct signals to the muscles that control breathing.  Mixed sleep apnea. This is a combination of obstructive and central sleep apnea.  What are the causes? The most common cause of this condition is a collapsed or blocked airway. An airway can collapse or become blocked if:  Your throat muscles are abnormally relaxed.  Your tongue and tonsils are larger than normal.  You are overweight.  Your airway is smaller than normal.  What increases the risk? This condition is more likely to develop in people who:  Are overweight.  Smoke.  Have a smaller than normal airway.  Are elderly.  Are female.  Drink alcohol.  Take sedatives or tranquilizers.  Have a family history of sleep apnea.  What are the signs or symptoms? Symptoms of this condition include:  Trouble staying asleep.  Daytime sleepiness and tiredness.  Irritability.  Loud snoring.  Morning headaches.  Trouble concentrating.  Forgetfulness.  Decreased interest in sex.  Unexplained sleepiness.  Mood swings.  Personality changes.  Feelings of depression.  Waking up often during the night to urinate.  Dry mouth.  Sore throat.  How is this diagnosed? This  condition may be diagnosed with:  A medical history.  A physical exam.  A series of tests that are done while you are sleeping (sleep study). These tests are usually done in a sleep lab, but they may also be done at home.  How is this treated? Treatment for this condition aims to restore normal breathing and to ease symptoms during sleep. It may involve managing health issues that can affect breathing, such as high blood pressure or obesity. Treatment may include:  Sleeping on your side.  Using a decongestant if you have nasal congestion.  Avoiding the use of depressants, including alcohol, sedatives, and narcotics.  Losing weight if you are overweight.  Making changes to your diet.  Quitting smoking.  Using a device to open your airway while you sleep, such as: ? An oral appliance. This is a custom-made mouthpiece that shifts your lower jaw forward. ? A continuous positive airway pressure (CPAP) device. This device delivers oxygen to your airway through a mask. ? A nasal expiratory positive airway pressure (EPAP) device. This device has valves that you put into each nostril. ? A bi-level positive airway pressure (BPAP) device. This device delivers oxygen to your airway through a mask.  Surgery if other treatments do not work. During surgery, excess tissue is removed to create a wider airway.  It is important to get treatment for sleep apnea. Without treatment, this condition can lead to:  High blood pressure.  Coronary artery disease.  (Men) An inability to achieve or maintain an erection (impotence).  Reduced thinking abilities.  Follow these instructions at home:  Make any lifestyle changes that your health care provider recommends.  Eat a healthy, well-balanced diet.  Take over-the-counter and prescription medicines only as told by your health care provider.  Avoid using depressants, including alcohol, sedatives, and narcotics.  Take steps to lose weight if you  are overweight.  If you were given a device to open your airway while you sleep, use it only as told by your health care provider.  Do not use any tobacco products, such as cigarettes, chewing tobacco, and e-cigarettes. If you need help quitting, ask your health care provider.  Keep all follow-up visits as told by your health care provider. This is important. Contact a health care provider if:  The device that you received to open your airway during sleep is uncomfortable or does not seem to be working.  Your symptoms do not improve.  Your symptoms get worse. Get help right away if:  You develop chest pain.  You develop shortness of breath.  You develop discomfort in your back, arms, or stomach.  You have trouble speaking.  You have weakness on one side of your body.  You have drooping in your face. These symptoms may represent a serious problem that is an emergency. Do not wait to see if the symptoms will go away. Get medical help right away. Call your local emergency services (911 in the [place] Do not drive yourself to the hospital. This information is not intended to replace advice given to you by your health care provider. Make sure you discuss any questions you have with your health care provider. Document Released: 05/20/2002 Document Revised: 01/24/2016 Document Reviewed: 03/09/2015 Elsevier Interactive Patient Education  [employer].

## 2017-08-07 NOTE — Assessment & Plan Note (Addendum)
Encouraged DASH diet, decrease po intake and increase exercise as tolerated. Needs 7-8 hours of sleep nightly. Avoid trans fats, eat small, frequent meals every 4-5 hours with lean proteins, complex carbs and healthy fats. Minimize simple carbs, consider bariatric referral 

## 2017-08-07 NOTE — Patient Instructions (Addendum)
Continue to eat heart healthy diet (full of fruits, vegetables, whole grains, lean protein, water--limit salt, fat, and sugar intake) and increase physical activity as tolerated.  Continue doing brain stimulating activities (puzzles, reading, adult coloring books, staying active) to keep memory sharp.    [name] , Thank you for taking time to come for your Medicare Wellness Visit. I appreciate your ongoing commitment to your health goals. Please review the following plan we discussed and let me know if I can assist you in the future.   These are the goals we discussed: Eat a healthy diet.   This is a list of the screening recommended for you and due dates:  Health Maintenance  Topic Date Due  . HIV Screening  10/30/1967  . Mammogram  04/18/2018  . Cologuard (Stool DNA test)  05/23/2018  . Tetanus Vaccine  06/03/2018  . Pap Smear  05/19/2019  . Flu Shot  Completed  .  Hepatitis C: One time screening is recommended by Center for Disease Control  (CDC) for  adults born from [birth year] through 1963.   Completed    Health Maintenance for Postmenopausal Women Menopause is a normal process in which your reproductive ability comes to an end. This process happens gradually over a span of months to years, usually between the ages of 57 and 68. Menopause is complete when you have missed 12 consecutive menstrual periods. It is important to talk with your health care provider about some of the most common conditions that affect postmenopausal women, such as heart disease, cancer, and bone loss (osteoporosis). Adopting a healthy lifestyle and getting preventive care can help to promote your health and wellness. Those actions can also lower your chances of developing some of these common conditions. What should I know about menopause? During menopause, you may experience a number of symptoms, such as:  Moderate-to-severe hot flashes.  Night sweats.  Decrease in sex drive.  Mood  swings.  Headaches.  Tiredness.  Irritability.  Memory problems.  Insomnia.  Choosing to treat or not to treat menopausal changes is an individual decision that you make with your health care provider. What should I know about hormone replacement therapy and supplements? Hormone therapy products are effective for treating symptoms that are associated with menopause, such as hot flashes and night sweats. Hormone replacement carries certain risks, especially as you become older. If you are thinking about using estrogen or estrogen with progestin treatments, discuss the benefits and risks with your health care provider. What should I know about heart disease and stroke? Heart disease, heart attack, and stroke become more likely as you age. This may be due, in part, to the hormonal changes that your body experiences during menopause. These can affect how your body processes dietary fats, triglycerides, and cholesterol. Heart attack and stroke are both medical emergencies. There are many things that you can do to help prevent heart disease and stroke:  Have your blood pressure checked at least every 1-2 years. High blood pressure causes heart disease and increases the risk of stroke.  If you are 3-55 years old, ask your health care provider if you should take aspirin to prevent a heart attack or a stroke.  Do not use any tobacco products, including cigarettes, chewing tobacco, or electronic cigarettes. If you need help quitting, ask your health care provider.  It is important to eat a healthy diet and maintain a healthy weight. ? Be sure to include plenty of vegetables, fruits, low-fat dairy products, and lean  protein. ? Avoid eating foods that are high in solid fats, added sugars, or salt (sodium).  Get regular exercise. This is one of the most important things that you can do for your health. ? Try to exercise for at least 150 minutes each week. The type of exercise that you do should  increase your heart rate and make you sweat. This is known as moderate-intensity exercise. ? Try to do strengthening exercises at least twice each week. Do these in addition to the moderate-intensity exercise.  Know your numbers.Ask your health care provider to check your cholesterol and your blood glucose. Continue to have your blood tested as directed by your health care provider.  What should I know about cancer screening? There are several types of cancer. Take the following steps to reduce your risk and to catch any cancer development as early as possible. Breast Cancer  Practice breast self-awareness. ? This means understanding how your breasts normally appear and feel. ? It also means doing regular breast self-exams. Let your health care provider know about any changes, no matter how small.  If you are 60 or older, have a clinician do a breast exam (clinical breast exam or CBE) every year. Depending on your age, family history, and medical history, it may be recommended that you also have a yearly breast X-ray (mammogram).  If you have a family history of breast cancer, talk with your health care provider about genetic screening.  If you are at high risk for breast cancer, talk with your health care provider about having an MRI and a mammogram every year.  Breast cancer (BRCA) gene test is recommended for women who have family members with BRCA-related cancers. Results of the assessment will determine the need for genetic counseling and BRCA1 and for BRCA2 testing. BRCA-related cancers include these types: ? Breast. This occurs in males or females. ? Ovarian. ? Tubal. This may also be called fallopian tube cancer. ? Cancer of the abdominal or pelvic lining (peritoneal cancer). ? Prostate. ? Pancreatic.  Cervical, Uterine, and Ovarian Cancer Your health care provider may recommend that you be screened regularly for cancer of the pelvic organs. These include your ovaries, uterus,  and vagina. This screening involves a pelvic exam, which includes checking for microscopic changes to the surface of your cervix (Pap test).  For women ages 21-65, health care providers may recommend a pelvic exam and a Pap test every three years. For women ages 67-65, they may recommend the Pap test and pelvic exam, combined with testing for human papilloma virus (HPV), every five years. Some types of HPV increase your risk of cervical cancer. Testing for HPV may also be done on women of any age who have unclear Pap test results.  Other health care providers may not recommend any screening for nonpregnant women who are considered low risk for pelvic cancer and have no symptoms. Ask your health care provider if a screening pelvic exam is right for you.  If you have had past treatment for cervical cancer or a condition that could lead to cancer, you need Pap tests and screening for cancer for at least 20 years after your treatment. If Pap tests have been discontinued for you, your risk factors (such as having a new sexual partner) need to be reassessed to determine if you should start having screenings again. Some women have medical problems that increase the chance of getting cervical cancer. In these cases, your health care provider may recommend that you have screening  and Pap tests more often.  If you have a family history of uterine cancer or ovarian cancer, talk with your health care provider about genetic screening.  If you have vaginal bleeding after reaching menopause, tell your health care provider.  There are currently no reliable tests available to screen for ovarian cancer.  Lung Cancer Lung cancer screening is recommended for adults 43-53 years old who are at high risk for lung cancer because of a history of smoking. A yearly low-dose CT scan of the lungs is recommended if you:  Currently smoke.  Have a history of at least 30 pack-years of smoking and you currently smoke or have quit  within the past 15 years. A pack-year is smoking an average of one pack of cigarettes per day for one year.  Yearly screening should:  Continue until it has been 15 years since you quit.  Stop if you develop a health problem that would prevent you from having lung cancer treatment.  Colorectal Cancer  This type of cancer can be detected and can often be prevented.  Routine colorectal cancer screening usually begins at age 69 and continues through age 19.  If you have risk factors for colon cancer, your health care provider may recommend that you be screened at an earlier age.  If you have a family history of colorectal cancer, talk with your health care provider about genetic screening.  Your health care provider may also recommend using home test kits to check for hidden blood in your stool.  A small camera at the end of a tube can be used to examine your colon directly (sigmoidoscopy or colonoscopy). This is done to check for the earliest forms of colorectal cancer.  Direct examination of the colon should be repeated every 5-10 years until age 64. However, if early forms of precancerous polyps or small growths are found or if you have a family history or genetic risk for colorectal cancer, you may need to be screened more often.  Skin Cancer  Check your skin from head to toe regularly.  Monitor any moles. Be sure to tell your health care provider: ? About any new moles or changes in moles, especially if there is a change in a mole's shape or color. ? If you have a mole that is larger than the size of a pencil eraser.  If any of your family members has a history of skin cancer, especially at a young age, talk with your health care provider about genetic screening.  Always use sunscreen. Apply sunscreen liberally and repeatedly throughout the day.  Whenever you are outside, protect yourself by wearing long sleeves, pants, a wide-brimmed hat, and sunglasses.  What should I know  about osteoporosis? Osteoporosis is a condition in which bone destruction happens more quickly than new bone creation. After menopause, you may be at an increased risk for osteoporosis. To help prevent osteoporosis or the bone fractures that can happen because of osteoporosis, the following is recommended:  If you are 42-38 years old, get at least 1,000 mg of calcium and at least 600 mg of vitamin D per day.  If you are older than age 49 but younger than age 34, get at least 1,200 mg of calcium and at least 600 mg of vitamin D per day.  If you are older than age 82, get at least 1,200 mg of calcium and at least 800 mg of vitamin D per day.  Smoking and excessive alcohol intake increase the risk of  osteoporosis. Eat foods that are rich in calcium and vitamin D, and do weight-bearing exercises several times each week as directed by your health care provider. What should I know about how menopause affects my mental health? Depression may occur at any age, but it is more common as you become older. Common symptoms of depression include:  Low or sad mood.  Changes in sleep patterns.  Changes in appetite or eating patterns.  Feeling an overall lack of motivation or enjoyment of activities that you previously enjoyed.  Frequent crying spells.  Talk with your health care provider if you think that you are experiencing depression. What should I know about immunizations? It is important that you get and maintain your immunizations. These include:  Tetanus, diphtheria, and pertussis (Tdap) booster vaccine.  Influenza every year before the flu season begins.  Pneumonia vaccine.  Shingles vaccine.  Your health care provider may also recommend other immunizations. This information is not intended to replace advice given to you by your health care provider. Make sure you discuss any questions you have with your health care provider. Document Released: 07/22/2005 Document Revised: 12/18/2015  Document Reviewed: 03/03/2015 Elsevier Interactive Patient Education  2018 [employer].

## 2017-08-07 NOTE — Assessment & Plan Note (Signed)
Baclofen helps some but not as much as it used to. Will try increasing to 20 mg qid prn report if she is willing to be referred for further consideration or could consider a course of Gabapentin.

## 2017-08-07 NOTE — Progress Notes (Signed)
Subjective:  I acted as a Education administrator for Dr. Charlett Blake. [place], [place]  Patient ID: [name], female    DOB: [birth year], 67 y.o.   MRN: [id number]  No chief complaint on file.   HPI  Patient is in today for a follow up and she continues to struggle with right thigh pain that is only partially managed by Baclofen 10 mg prn. No recent fall or injury. No recent hospitalizaitons. Her colostomy continues to help daily. Denies CP/palp/SOB/HA/congestion/fevers/GI or GU c/o. Taking meds as prescribed  Patient Care Team: Mosie Lukes, MD as PCP - General (Family Medicine) Irine Seal, MD as Attending Physician (Urology) Clent Jacks, MD as Consulting Physician (Ophthalmology) Laurance Flatten, MD as Consulting Physician (Orthopedic Surgery) Cheryll Cockayne, MD as Consulting Physician (Surgical Oncology) Ronald Lobo, MD as Consulting Physician (Gastroenterology) Danella Sensing, MD as Consulting Physician (Dermatology)   Past Medical History:  Diagnosis Date  . Acute bronchitis 05/16/2015  . Arthritis   . Bowel dysfunction    constipation and fecal retention  . Cerumen impaction 10/11/2015  . Constipation 01/25/2016  . Contusion 05/18/2014   Of foot  . Depression 02/13/2017  . Headache 04/09/2017  . History of kidney stones   . Hyperglycemia 05/18/2015  . Hyperlipidemia   . Hypertension   . Incomplete quadriplegia at C5-6 level ([place]) 07/24/2008   Qualifier: Diagnosis of  By: Wynona Luna Requires bowel regimen including enemas and indwelling foley cath Has had tendency towards pressure sores in past. Had a tear with a transfer in Hospital in past for surgery and developed a sore at that time.   . Medicare annual wellness visit, subsequent 11/02/2014   Follows with [facility] Declines colonoscopy  No h/o abnormal pap, declines further paps  . Mitral valve disorder 12/01/2012  . Neurogenic bladder   . Obesity   . Obesity 11/01/2016  . Obstructive sleep apnea 01/31/2016  . Pain in  joint, shoulder region 12/01/2012   Long history B/l [facility]  . Postmenopausal bleeding 12/17/2012  . Preventative health care 06/26/2013  . Quadriplegia ([place]) 1981   Secondary to ruptured AVM C5-C6  . Sacral pressure ulcer 12/01/2012   H/o Follows with Dr Tomi Likens of Dermatology  . Skin cancer 11/01/2016   2 [facility] RLE excised by Dr Wilhemina Bonito 2018 [place] LLE excised by Dr Wilhemina Bonito 2018  . Vaginitis and vulvovaginitis 12/17/2012    Past Surgical History:  Procedure Laterality Date  . COLOSTOMY  02/22/2016  . HERNIA REPAIR  6160   umbilical hernia repair  . LAMINECTOMY  1981   AVM  . [place]   left shoulder  . SHOULDER ARTHROSCOPY  2008   right  . TOTAL SHOULDER REPLACEMENT  2001   left  . TRANSFER TENDON HAND  1982, 1983   to right hand  . VENTRAL HERNIA REPAIR  08/17/2010   Postop ileus (Dr. Redmond Pulling)    Family History  Problem Relation Age of Onset  . Cancer Mother        breast & melanoma  . Hypertension Mother   . Heart disease Father        CAD, MI, AFib  . Cancer Sister        melanoma  . Cancer Maternal Grandfather        melanoma  . Stroke Paternal Grandmother   . Heart disease Paternal Grandfather   . Cancer Maternal Aunt  breast  . Cancer Paternal Aunt        breast  . Cancer Paternal Uncle        breast  . Cancer Paternal Aunt        breast    Social History   Socioeconomic History  . Marital status: Single    Spouse name: Not on file  . Number of children: Not on file  . Years of education: Not on file  . Highest education level: Not on file  Social Needs  . Financial resource strain: Not on file  . Food insecurity - worry: Not on file  . Food insecurity - inability: Not on file  . Transportation needs - medical: Not on file  . Transportation needs - non-medical: Not on file  Occupational History  . Occupation: Disabled    Comment: Music therapist  Tobacco Use  . Smoking status:  Never Smoker  . Smokeless tobacco: Never Used  Substance and Sexual Activity  . Alcohol use: No  . Drug use: No  . Sexual activity: Not on file    Comment: lives with sister and mother, no major dietary restrictions  Other Topics Concern  . Not on file  Social History Narrative   Sister is primary caregiver    Outpatient Medications Prior to Visit  Medication Sig Dispense Refill  . baclofen (LIORESAL) 20 MG tablet TAKE 1/2 TO 1 TABLET BY MOUTH 3 TIMES DAILY 90 each 3  . clobetasol (TEMOVATE) 0.05 % external solution Apply topically.    . docusate sodium (COLACE) 100 MG capsule Take 1 tablet by mouth daily.    Marland Kitchen FIBER PO Take 1 capsule by mouth daily.    Marland Kitchen FLUoxetine (PROZAC) 20 MG tablet Take 1 tablet (20 mg total) by mouth daily. 1/2 tab po daily then increase to 1 tab po daily 30 tablet 3  . furosemide (LASIX) 20 MG tablet Take 1 tablet (20 mg total) by mouth 2 (two) times daily. 180 tablet 1  . methylcellulose (FIBER THERAPY) oral powder Take 1 capsule by mouth daily.    . metroNIDAZOLE (METROCREAM) 0.75 % cream Apply topically 2 (two) times daily.    . Multiple Vitamins-Calcium (ONE-A-DAY WOMENS FORMULA PO) Take 1 tablet by mouth daily.      . mupirocin ointment (BACTROBAN) 2 % Place 1 application into the nose 2 (two) times daily. 22 g 1  . oxybutynin (DITROPAN) 5 MG tablet Take 5 mg by mouth every morning.      . potassium chloride SA (K-DUR,KLOR-CON) 20 MEQ tablet Take 2 tablets (40 mEq total) by mouth 2 (two) times daily. 360 tablet 1  . Probiotic Product (DIGESTIVE ADVANTAGE) CAPS Take 1 tablet by mouth daily.    . rosuvastatin (CRESTOR) 5 MG tablet Take 1 tablet (5 mg total) by mouth every other day. 15 tablet 3   No facility-administered medications prior to visit.     Allergies  Allergen Reactions  . Adhesive [Tape] Other (See Comments)    Skin Irritant.    Review of Systems  Constitutional: Positive for malaise/fatigue. Negative for fever.  HENT: Negative for  congestion.   Eyes: Negative for blurred vision.  Respiratory: Negative for shortness of breath.   Cardiovascular: Negative for chest pain, palpitations and leg swelling.  Gastrointestinal: Negative for abdominal pain, blood in stool and nausea.  Genitourinary: Negative for dysuria and frequency.  Musculoskeletal: Positive for joint pain and myalgias. Negative for falls.  Skin: Negative for rash.  Neurological: Negative for dizziness, loss  of consciousness and headaches.  Endo/Heme/Allergies: Negative for environmental allergies.  Psychiatric/Behavioral: Negative for depression. The patient is not nervous/anxious.        Objective:    Physical Exam  Constitutional: She is oriented to person, place, and time. She appears well-developed and well-nourished. No distress.  In Wheelchair, paraplegia  HENT:  Head: Normocephalic and atraumatic.  Nose: Nose normal.  Eyes: Right eye exhibits no discharge. Left eye exhibits no discharge.  Neck: Normal range of motion. Neck supple.  Cardiovascular: Normal rate and regular rhythm.  No murmur heard. Pulmonary/Chest: Effort normal and breath sounds normal.  Abdominal: Soft. Bowel sounds are normal. There is no tenderness.  Musculoskeletal: She exhibits tenderness. She exhibits no edema.  Neurological: She is alert and oriented to person, place, and time.  Skin: Skin is warm and dry.  Psychiatric: She has a normal mood and affect.  Nursing note and vitals reviewed.   BP (!) 150/86 (BP Location: Left Arm, Patient Position: Sitting, Cuff Size: Normal)   Pulse (!) 57   Temp 98.5 F (36.9 C) (Oral)   Resp 18   SpO2 93%  Wt Readings from Last 3 Encounters:  05/06/17 225 lb (102.1 kg)  10/31/16 221 lb (100.2 kg)  05/19/16 221 lb (100.2 kg)   BP Readings from Last 3 Encounters:  08/07/17 (!) 149/84  08/07/17 (!) 150/86  02/06/17 132/68     Immunization History  Administered Date(s) Administered  . H1N1 07/24/2008  . Influenza Split  04/05/2011, 04/18/2012  . Influenza Whole 06/03/2008, 03/31/2009, 04/01/2010  . Influenza,inj,Quad PF,6+ Mos 03/20/2013, 03/03/2014, 05/18/2015, 04/20/2016, 04/06/2017  . Pneumococcal Conjugate-13 06/24/2013  . Pneumococcal Polysaccharide-23 12/07/2005  . Td 06/03/2008  . Zoster 11/26/2012    Health Maintenance  Topic Date Due  . HIV Screening  10/30/1967  . MAMMOGRAM  04/18/2018  . Fecal DNA (Cologuard)  05/23/2018  . TETANUS/TDAP  06/03/2018  . PAP SMEAR  05/19/2019  . INFLUENZA VACCINE  Completed  . Hepatitis C Screening  Completed    Lab Results  Component Value Date   WBC 7.2 02/06/2017   HGB 13.6 02/06/2017   HCT 41.3 02/06/2017   PLT 226.0 02/06/2017   GLUCOSE 77 02/06/2017   CHOL 174 02/06/2017   TRIG 250.0 (H) 02/06/2017   HDL 30.50 (L) 02/06/2017   LDLDIRECT 105.0 02/06/2017   LDLCALC 105 (H) 10/31/2016   ALT 26 02/06/2017   AST 20 02/06/2017   NA 138 02/06/2017   K 4.5 02/06/2017   CL 103 02/06/2017   CREATININE 0.45 02/06/2017   BUN 8 02/06/2017   CO2 30 02/06/2017   TSH 2.34 02/06/2017   HGBA1C 5.9 02/06/2017    Lab Results  Component Value Date   TSH 2.34 02/06/2017   Lab Results  Component Value Date   WBC 7.2 02/06/2017   HGB 13.6 02/06/2017   HCT 41.3 02/06/2017   MCV 82.5 02/06/2017   PLT 226.0 02/06/2017   Lab Results  Component Value Date   NA 138 02/06/2017   K 4.5 02/06/2017   CO2 30 02/06/2017   GLUCOSE 77 02/06/2017   BUN 8 02/06/2017   CREATININE 0.45 02/06/2017   BILITOT 0.3 02/06/2017   ALKPHOS 80 02/06/2017   AST 20 02/06/2017   ALT 26 02/06/2017   PROT 7.1 02/06/2017   ALBUMIN 3.7 02/06/2017   CALCIUM 9.4 02/06/2017   GFR 148.96 02/06/2017   Lab Results  Component Value Date   CHOL 174 02/06/2017   Lab Results  Component  Value Date   HDL 30.50 (L) 02/06/2017   Lab Results  Component Value Date   LDLCALC 105 (H) 10/31/2016   Lab Results  Component Value Date   TRIG 250.0 (H) 02/06/2017   Lab Results    Component Value Date   CHOLHDL 6 02/06/2017   Lab Results  Component Value Date   HGBA1C 5.9 02/06/2017         Assessment & Plan:   Problem List Items Addressed This Visit    Hyperlipidemia    Tolerating statin, encouraged heart healthy diet, avoid trans fats, minimize simple carbs and saturated fats. Increase exercise as tolerated      Relevant Orders   Lipid panel   Essential hypertension - Primary    Denies CP/palp/SOB/HA/congestion/fevers/GI or GU c/o. Taking meds as prescribed      Relevant Orders   CBC   Comprehensive metabolic panel   TSH   INSOMNIA    Sleeping better with CPAP still feels tired in afternoons.       Hyperglycemia    hgba1c acceptable, minimize simple carbs. Increase exercise as tolerated.       Relevant Orders   Hemoglobin A1c   Sleep apnea    Using CPAP nightly      Obesity    Encouraged DASH diet, decrease po intake and increase exercise as tolerated. Needs 7-8 hours of sleep nightly. Avoid trans fats, eat small, frequent meals every 4-5 hours with lean proteins, complex carbs and healthy fats. Minimize simple carbs, consider bariatric referral      Right hip pain    Baclofen helps some but not as much as it used to. Will try increasing to 20 mg qid prn report if she is willing to be referred for further consideration or could consider a course of Gabapentin.          I am having [name] maintain her Multiple Vitamins-Calcium (ONE-A-DAY WOMENS FORMULA PO), oxybutynin, clobetasol, DIGESTIVE ADVANTAGE, methylcellulose, docusate sodium, metroNIDAZOLE, mupirocin ointment, FIBER PO, potassium chloride SA, baclofen, furosemide, FLUoxetine, and rosuvastatin.  No orders of the defined types were placed in this encounter.   CMA served as Education administrator during this visit. History, Physical and Plan performed by medical provider. Documentation and orders reviewed and attested to.  Penni Homans, MD

## 2017-08-07 NOTE — Assessment & Plan Note (Signed)
hgba1c acceptable, minimize simple carbs. Increase exercise as tolerated.  

## 2017-08-07 NOTE — Assessment & Plan Note (Signed)
Tolerating statin, encouraged heart healthy diet, avoid trans fats, minimize simple carbs and saturated fats. Increase exercise as tolerated 

## 2017-08-07 NOTE — Assessment & Plan Note (Signed)
Sleeping better with CPAP still feels tired in afternoons.

## 2017-08-07 NOTE — Assessment & Plan Note (Signed)
Using CPAP nightly 

## 2017-08-07 NOTE — Assessment & Plan Note (Signed)
Denies CP/palp/SOB/HA/congestion/fevers/GI or GU c/o. Taking meds as prescribed 

## 2017-08-08 ENCOUNTER — Telehealth: Payer: Self-pay | Admitting: Family Medicine

## 2017-08-08 LAB — COMPREHENSIVE METABOLIC PANEL
ALT: 21 U/L (ref 0–35)
AST: 15 U/L (ref 0–37)
Albumin: 3.8 g/dL (ref 3.5–5.2)
Alkaline Phosphatase: 86 U/L (ref 39–117)
BUN: 8 mg/dL (ref 6–23)
CO2: 30 mEq/L (ref 19–32)
Calcium: 9.4 mg/dL (ref 8.4–10.5)
Chloride: 103 mEq/L (ref 96–112)
Creatinine, Ser: 0.57 mg/dL (ref 0.40–1.20)
GFR: 113.22 mL/min (ref >60.00)
Glucose, Bld: 94 mg/dL (ref 70–99)
Potassium: 4.7 mEq/L (ref 3.5–5.1)
Sodium: 139 mEq/L (ref 135–145)
Total Bilirubin: 0.4 mg/dL (ref 0.2–1.2)
Total Protein: 7.3 g/dL (ref 6.0–8.3)

## 2017-08-08 LAB — CBC
HCT: 42 % (ref 36.0–46.0)
Hemoglobin: 13.9 g/dL (ref 12.0–15.0)
MCHC: 33 g/dL (ref 30.0–36.0)
MCV: 82 fl (ref 78.0–100.0)
Platelets: 224 10*3/uL (ref 150.0–400.0)
RBC: 5.12 Mil/uL — ABNORMAL HIGH (ref 3.87–5.11)
RDW: 14.9 % (ref 11.5–15.5)
WBC: 7.2 10*3/uL (ref 4.0–10.5)

## 2017-08-08 LAB — LIPID PANEL
Cholesterol: 181 mg/dL (ref 0–200)
HDL: 38.5 mg/dL — ABNORMAL LOW (ref >39.00)
LDL Cholesterol: 115 mg/dL — ABNORMAL HIGH (ref 0–99)
NonHDL: 142.89
Total CHOL/HDL Ratio: 5
Triglycerides: 141 mg/dL (ref 0.0–149.0)
VLDL: 28.2 mg/dL (ref 0.0–40.0)

## 2017-08-08 LAB — HEMOGLOBIN A1C: Hgb A1c MFr Bld: 5.9 % (ref 4.6–6.5)

## 2017-08-08 LAB — TSH: TSH: 2.28 u[IU]/mL (ref 0.35–4.50)

## 2017-08-08 MED ORDER — BACLOFEN 20 MG PO TABS: 10.0000 mg | ORAL_TABLET | Freq: Four times a day (QID) | ORAL | 3 refills | Status: DC | PRN

## 2017-08-08 NOTE — Addendum Note (Signed)
Addended by: Magdalene Molly A on: 08/08/2017 08:37 AM   Modules accepted: Orders

## 2017-08-08 NOTE — Telephone Encounter (Signed)
Copied from [place]. Topic: Quick Communication - See Telephone Encounter >> Aug 08, 2017  8:18 AM Lolita Rieger, RMA wrote: CRM for notification. See Telephone encounter for:   08/08/17.Wayne from [employer] called and stated he has 2 instructions for baclofen and needs clarification please call [phone]

## 2017-08-10 NOTE — Telephone Encounter (Signed)
Sent corrections over

## 2017-08-14 ENCOUNTER — Other Ambulatory Visit: Payer: Self-pay | Admitting: Family Medicine

## 2017-08-16 ENCOUNTER — Other Ambulatory Visit: Payer: Self-pay | Admitting: Family Medicine

## 2017-08-16 NOTE — Telephone Encounter (Signed)
Copied from [place]. Topic: Quick Communication - See Telephone Encounter >> Aug 16, 2017  2:01 PM Conception Chancy, NT wrote: CRM for notification. See Telephone encounter for:  08/16/17.  Patient is calling and states she has been trying to get a refill on FLUoxetine (PROZAC) 20 MG tablet. She states she is completely out and is requesting this refilled today. Please advise.   [facility] - [place] - [address] [address]  [address] [place] [address]  Phone: [phone] Fax: [phone]

## 2017-08-28 ENCOUNTER — Telehealth: Payer: Self-pay | Admitting: *Deleted

## 2017-08-28 NOTE — Telephone Encounter (Signed)
Received Physician Orders/CMN from [employer] for Medical Equipment repairs; forwarded to provider/SLS 03/18

## 2017-09-08 ENCOUNTER — Telehealth: Payer: Self-pay | Admitting: *Deleted

## 2017-09-08 NOTE — Telephone Encounter (Signed)
Received Physician Orders/CMN from [employer] for Medical Equipment repairs; forwarded to provider/SLS 03/29

## 2017-09-28 ENCOUNTER — Telehealth: Payer: Self-pay | Admitting: *Deleted

## 2017-09-28 NOTE — Telephone Encounter (Signed)
Received Physician Orders from [employer], for repair on medical equipment; forwarded to provider [this is the third time receiving and returning form]/SLS 04/18

## 2017-10-04 ENCOUNTER — Other Ambulatory Visit: Payer: Self-pay | Admitting: Family Medicine

## 2017-10-10 ENCOUNTER — Other Ambulatory Visit: Payer: Self-pay | Admitting: Family Medicine

## 2017-10-17 ENCOUNTER — Other Ambulatory Visit: Payer: Self-pay | Admitting: Family Medicine

## 2017-10-19 ENCOUNTER — Other Ambulatory Visit: Payer: Self-pay | Admitting: Family Medicine

## 2017-11-06 IMAGING — DX DG FOOT COMPLETE 3+V*L*
3 series · 3 of 3 positions shown · non-contrast
Comparison: None in PACs

CLINICAL DATA: Lower extremity paraplegia, direct trauma to both
feet last week, pain and redness in the right foot.

EXAM:
LEFT FOOT - COMPLETE 3+ VIEW; RIGHT FOOT COMPLETE - 3+ VIEW

[foot ap]
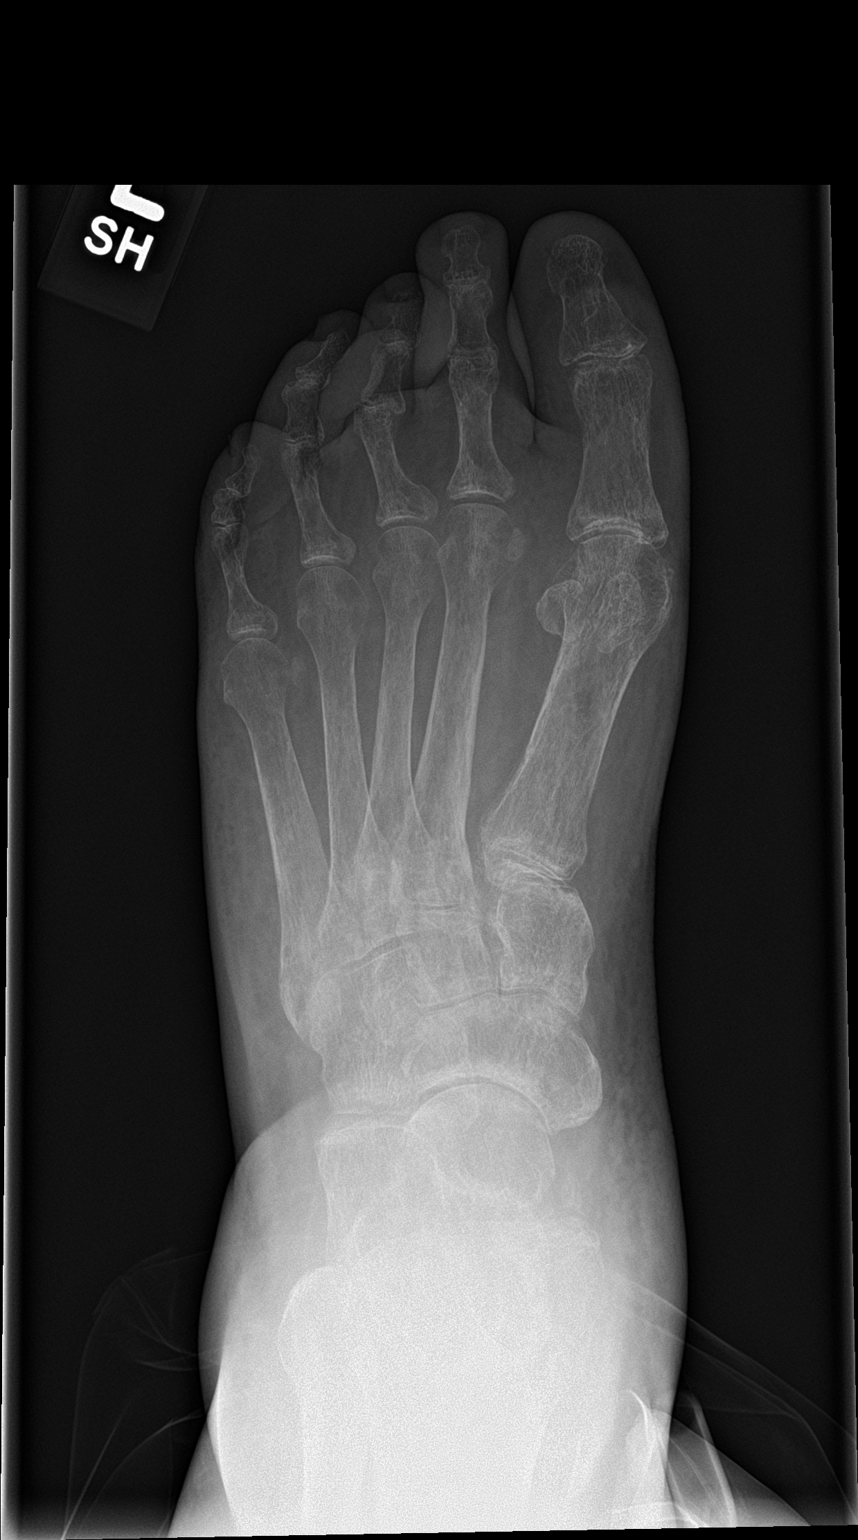

[foot obl]
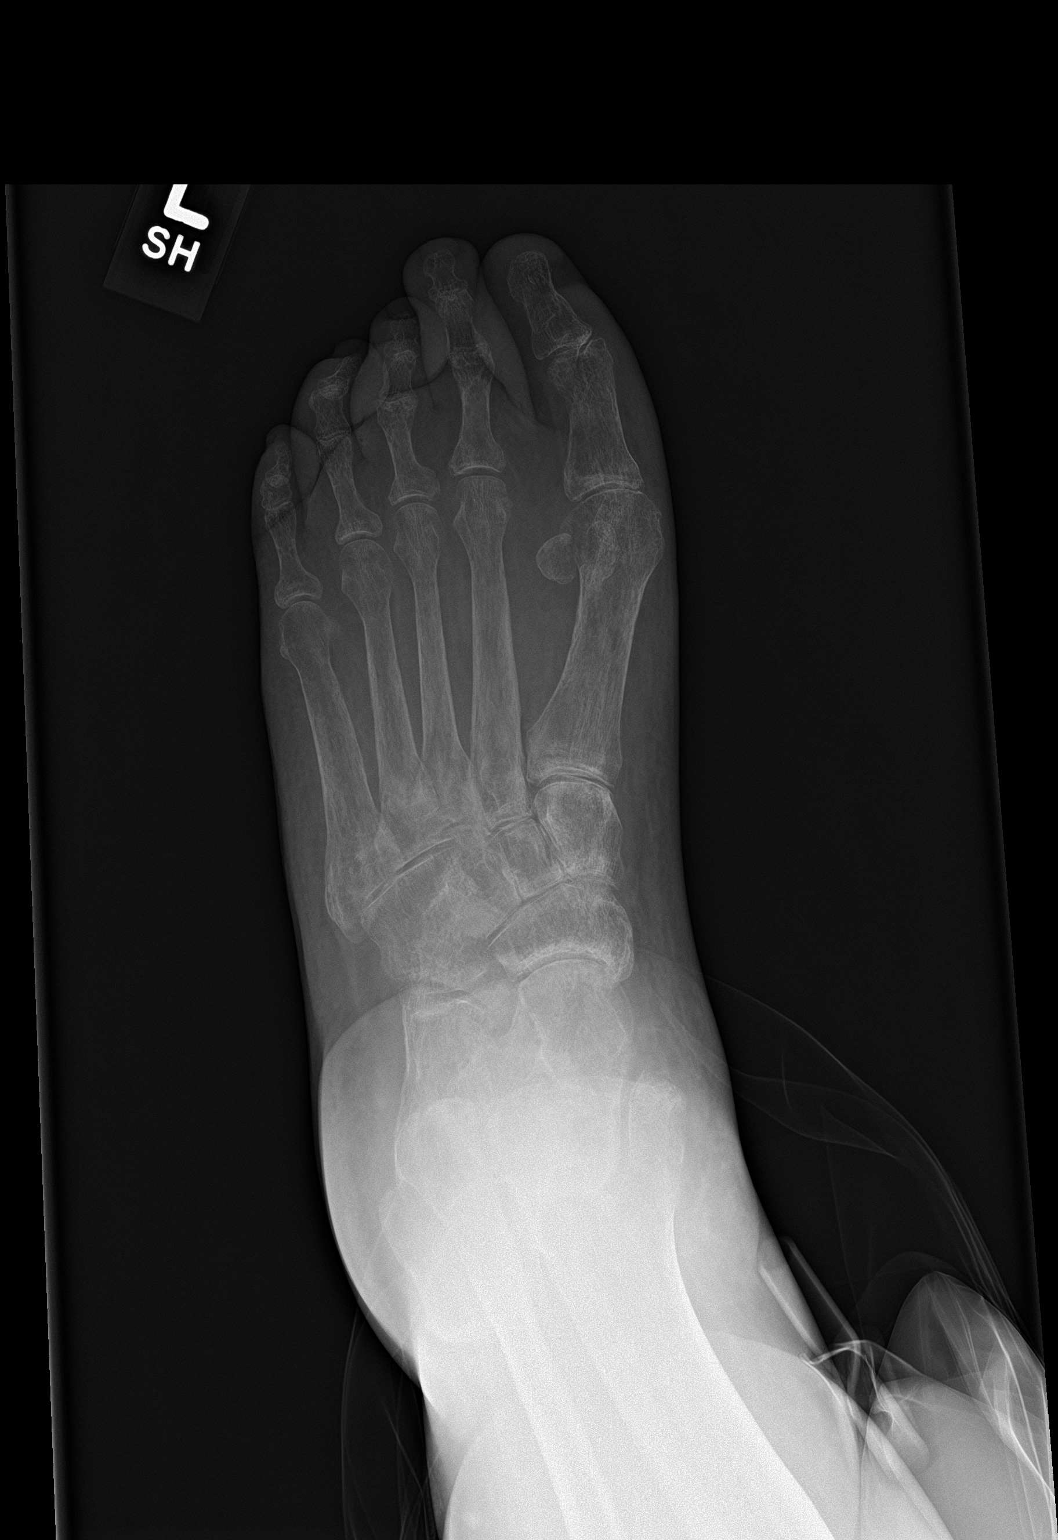

[foot lat]
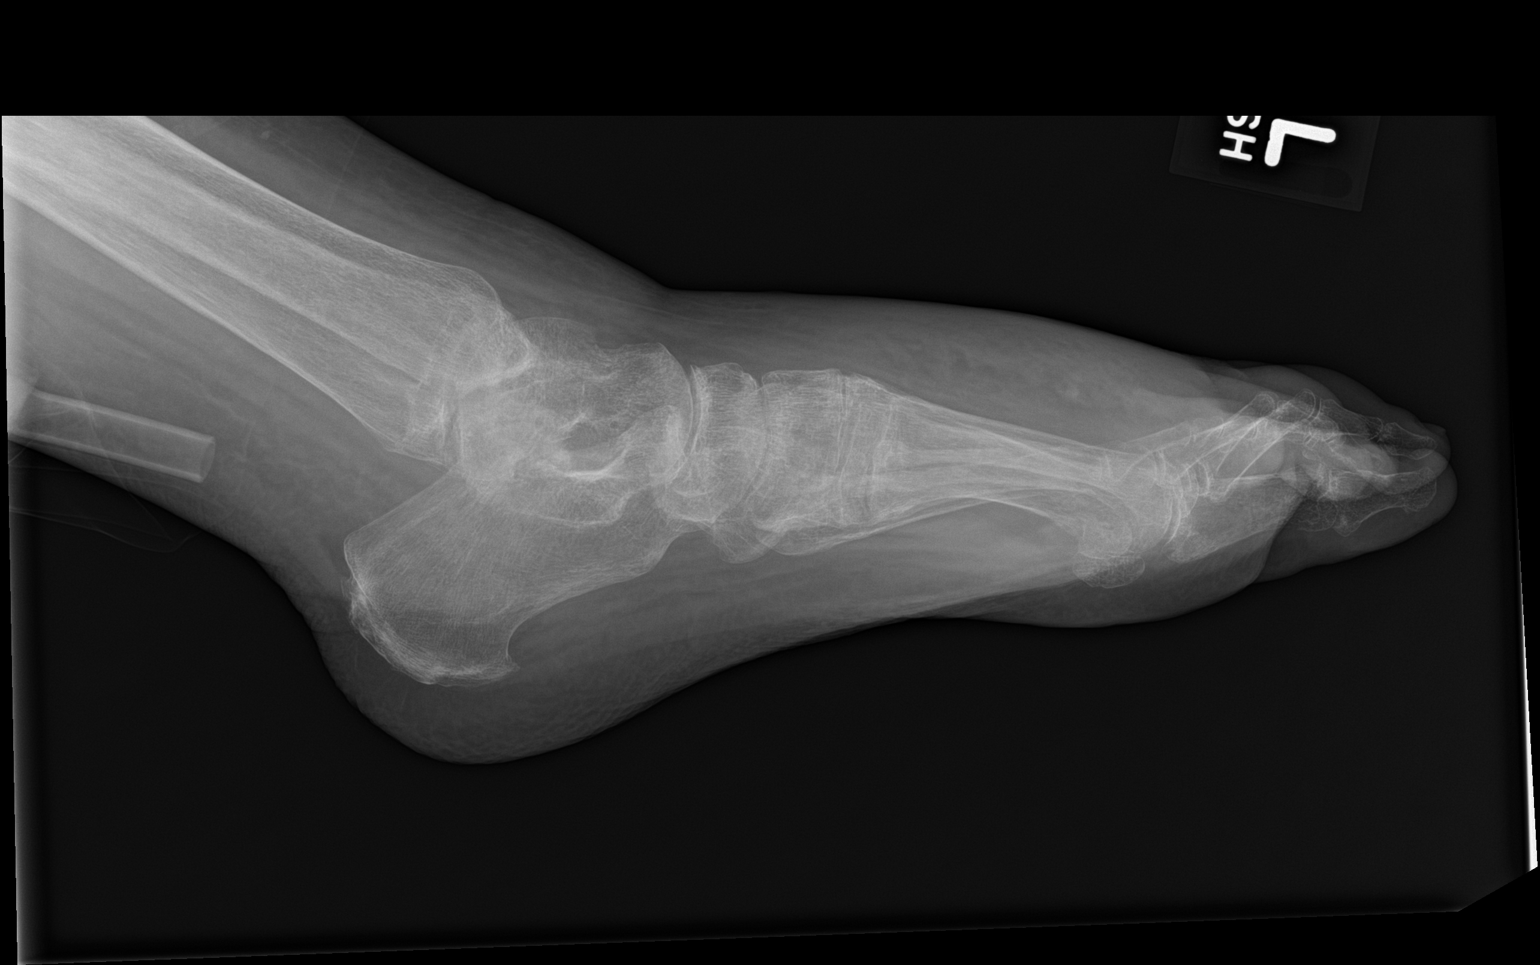

[3 of 3 positions shown; findings below may reference images not displayed]

FINDINGS: Right foot: The bones are diffusely osteopenic. There are
degenerative changes of the first MTP joint. No acute phalangeal or
metatarsal fracture is observed. The patient may have undergone
previous bunionectomy on the right. There is some flattening of the
plantar arch. There is a plantar calcaneal spur. The tarsal bones
are grossly intact. There is diffuse soft tissue swelling.

Left foot: The bones of the left foot are diffusely osteopenic. No
acute fracture is observed. Mild narrowing of the first MTP joint is
present but not as great as that on the right. There is a small
plantar calcaneal spur. The tarsal bones appear intact. The plantar
arch appears to be somewhat flattened. There is diffuse soft tissue
swelling.
IMPRESSION: 1. No acute right foot fracture is observed. There is severe diffuse
osteopenia with generalized soft tissue swelling.
2. No acute left foot fracture is observed. Diffuse osteopenia is
similar to that on the right is observed. Moderate diffuse soft
tissue swelling.

## 2017-11-06 IMAGING — DX DG FOOT COMPLETE 3+V*R*
3 series · 3 of 3 positions shown · non-contrast
Comparison: None in PACs

CLINICAL DATA: Lower extremity paraplegia, direct trauma to both
feet last week, pain and redness in the right foot.

EXAM:
LEFT FOOT - COMPLETE 3+ VIEW; RIGHT FOOT COMPLETE - 3+ VIEW

[foot ap]
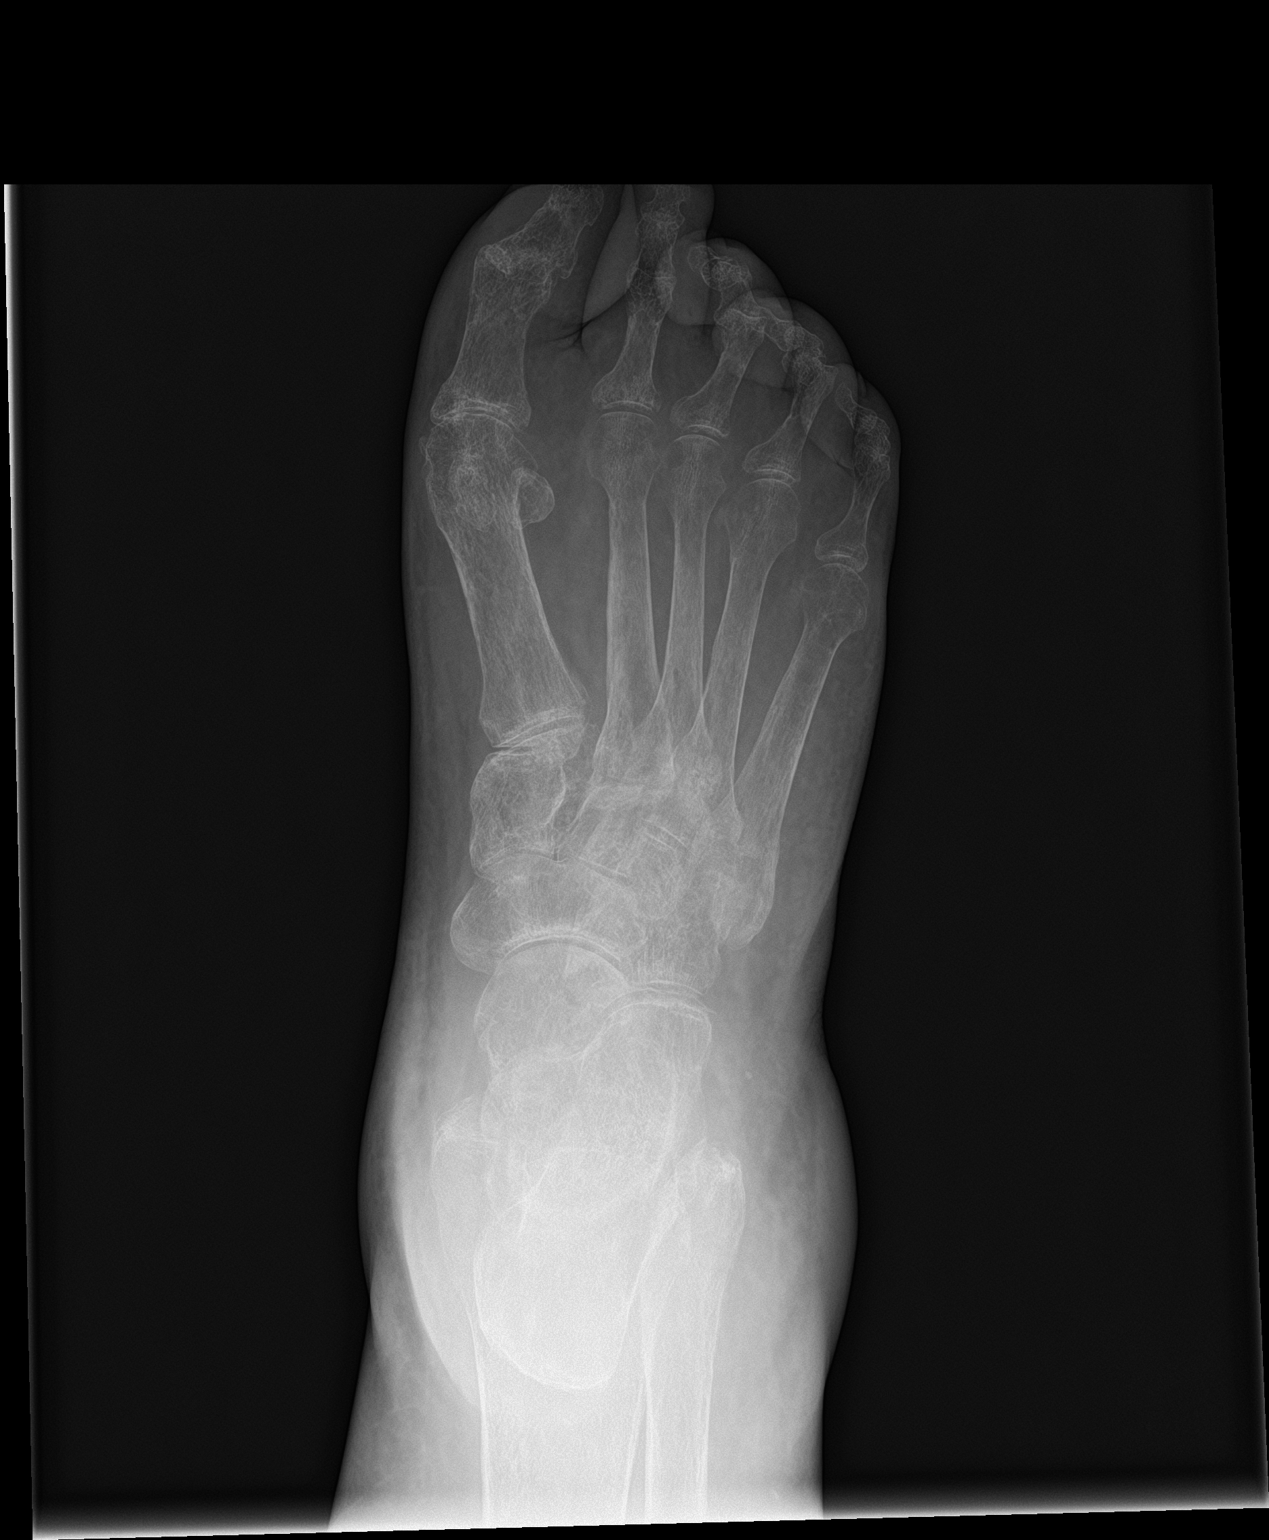

[foot obl]
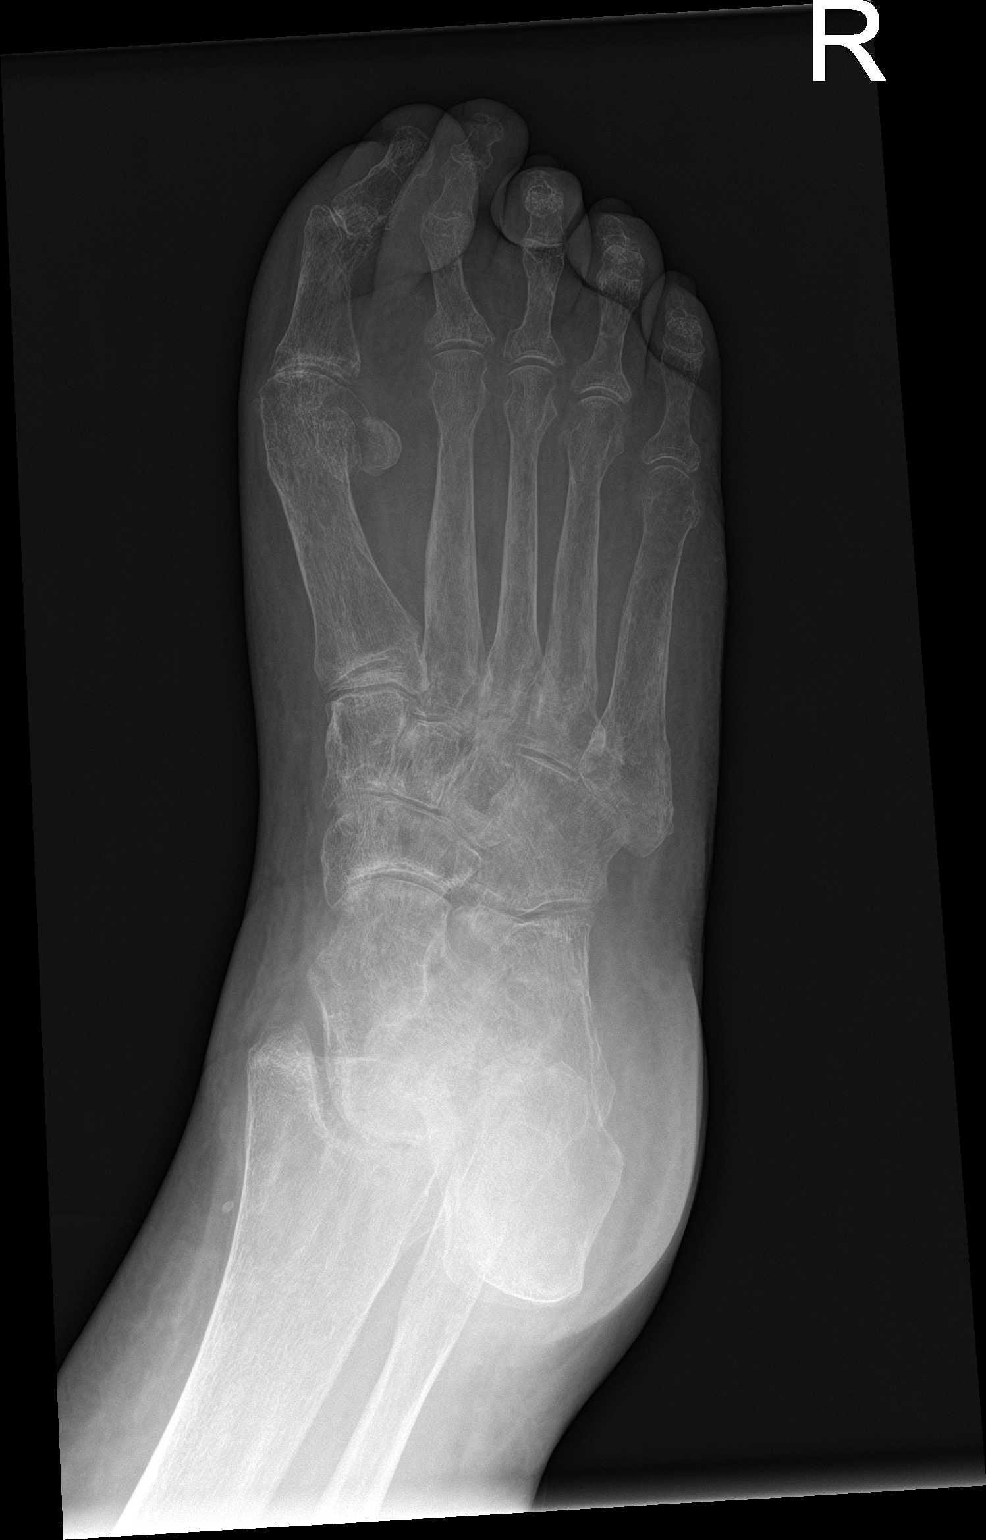

[foot lat]
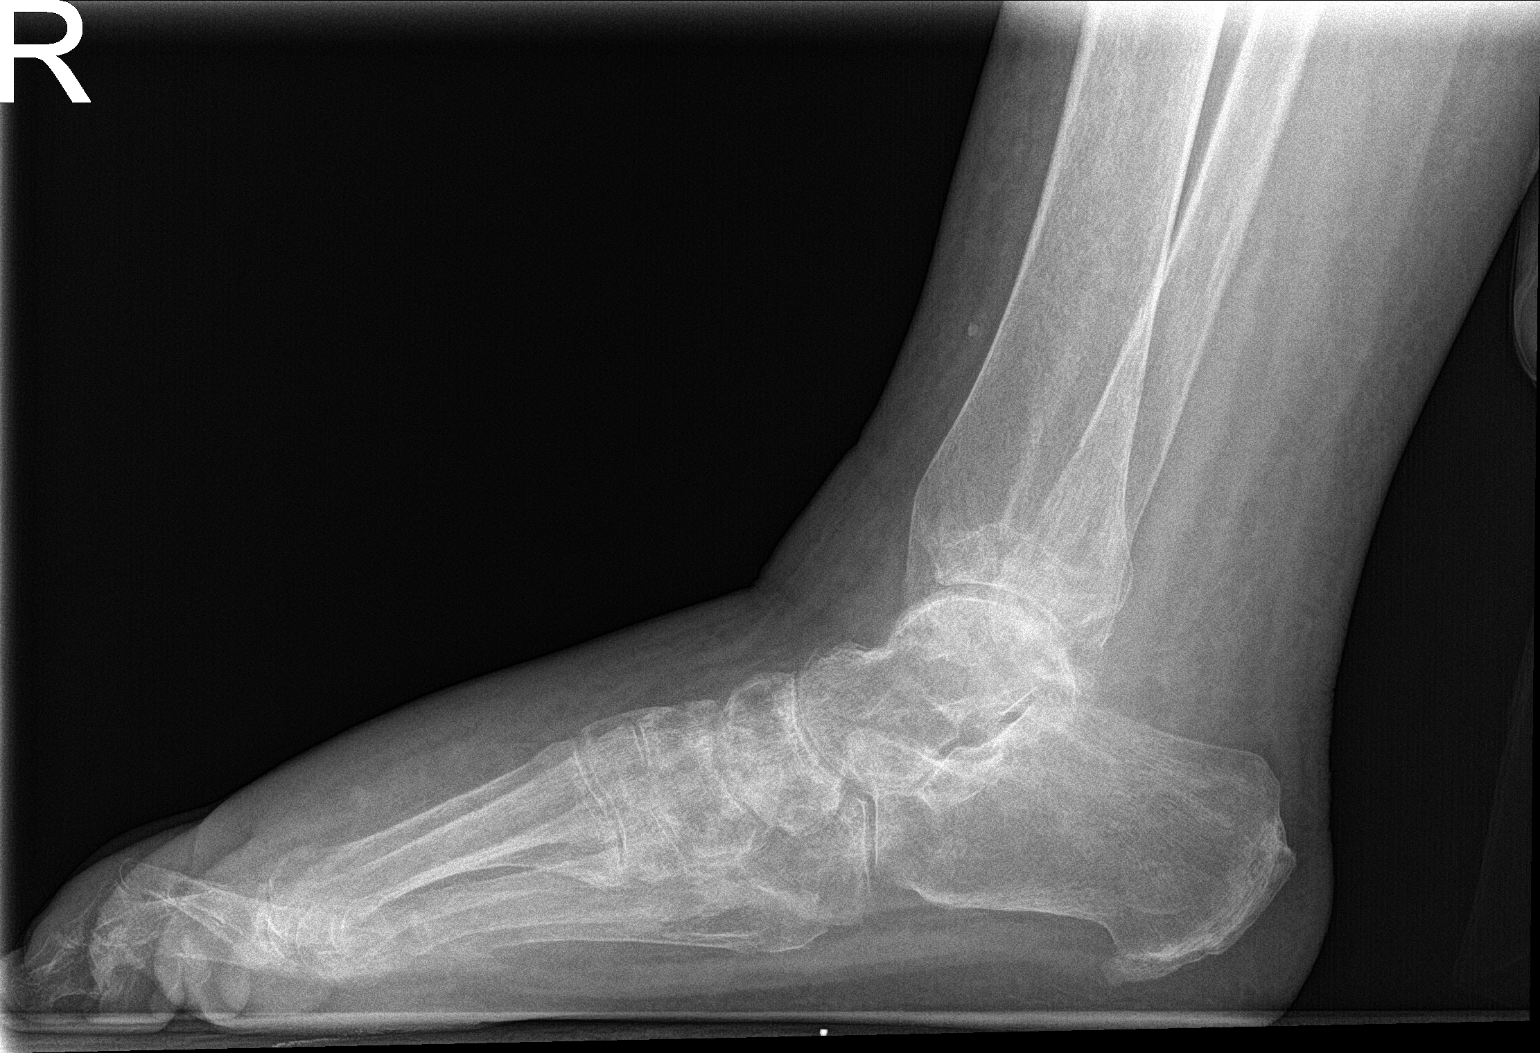

[3 of 3 positions shown; findings below may reference images not displayed]

FINDINGS: Right foot: The bones are diffusely osteopenic. There are
degenerative changes of the first MTP joint. No acute phalangeal or
metatarsal fracture is observed. The patient may have undergone
previous bunionectomy on the right. There is some flattening of the
plantar arch. There is a plantar calcaneal spur. The tarsal bones
are grossly intact. There is diffuse soft tissue swelling.

Left foot: The bones of the left foot are diffusely osteopenic. No
acute fracture is observed. Mild narrowing of the first MTP joint is
present but not as great as that on the right. There is a small
plantar calcaneal spur. The tarsal bones appear intact. The plantar
arch appears to be somewhat flattened. There is diffuse soft tissue
swelling.
IMPRESSION: 1. No acute right foot fracture is observed. There is severe diffuse
osteopenia with generalized soft tissue swelling.
2. No acute left foot fracture is observed. Diffuse osteopenia is
similar to that on the right is observed. Moderate diffuse soft
tissue swelling.

## 2017-11-16 ENCOUNTER — Encounter: Payer: Self-pay | Admitting: Family Medicine

## 2017-11-16 ENCOUNTER — Ambulatory Visit (INDEPENDENT_AMBULATORY_CARE_PROVIDER_SITE_OTHER): Payer: Medicare Other | Admitting: Family Medicine

## 2017-11-16 DIAGNOSIS — D649 Anemia, unspecified: Secondary | ICD-10-CM

## 2017-11-16 DIAGNOSIS — E782 Mixed hyperlipidemia: Secondary | ICD-10-CM

## 2017-11-16 DIAGNOSIS — I10 Essential (primary) hypertension: Secondary | ICD-10-CM | POA: Diagnosis not present

## 2017-11-16 DIAGNOSIS — R739 Hyperglycemia, unspecified: Secondary | ICD-10-CM

## 2017-11-16 MED ORDER — BACLOFEN 20 MG PO TABS: 10.0000 mg | ORAL_TABLET | Freq: Four times a day (QID) | ORAL | 3 refills | Status: DC | PRN

## 2017-11-16 MED ORDER — FLUOXETINE HCL 10 MG PO TABS: 20.0000 mg | ORAL_TABLET | Freq: Every day | ORAL | 1 refills | Status: DC

## 2017-11-16 MED ORDER — FUROSEMIDE 20 MG PO TABS: 20.0000 mg | ORAL_TABLET | Freq: Two times a day (BID) | ORAL | 1 refills | Status: DC

## 2017-11-16 MED ORDER — POTASSIUM CHLORIDE CRYS ER 20 MEQ PO TBCR: 40.0000 meq | EXTENDED_RELEASE_TABLET | Freq: Two times a day (BID) | ORAL | 1 refills | Status: DC

## 2017-11-16 NOTE — Assessment & Plan Note (Signed)
Encouraged heart healthy diet, increase exercise, avoid trans fats, consider a krill oil cap daily 

## 2017-11-16 NOTE — Progress Notes (Signed)
Subjective:  I acted as a Education administrator for Dr. Charlett Blake. [place], [place]  Patient ID: [name], female    DOB: 01-05-1951, 67 y.o.   MRN: [id number]   No chief complaint on file.   HPI  Patient is in today for 3 month follow up and she is accompanied by her sister. No recent febrile illness or hospitalizations. She has tried to eat better and believes she has lost weight. Is having good response to her colostomy and is moving her bowels better. Denies CP/palp/SOB/HA/congestion/fevers/GI or GU c/o. Taking meds as prescribed. Had a tick bite on her scalp at occiput and her dermatologist had to remove it no fevers, rash or signs of illness.  Patient Care Team: Mosie Lukes, MD as PCP - General (Family Medicine) Irine Seal, MD as Attending Physician (Urology) Clent Jacks, MD as Consulting Physician (Ophthalmology) Laurance Flatten, MD as Consulting Physician (Orthopedic Surgery) Cheryll Cockayne, MD as Consulting Physician (Surgical Oncology) Ronald Lobo, MD as Consulting Physician (Gastroenterology) Danella Sensing, MD as Consulting Physician (Dermatology)   Past Medical History:  Diagnosis Date  . Acute bronchitis 05/16/2015  . Arthritis   . Bowel dysfunction    constipation and fecal retention  . Cerumen impaction 10/11/2015  . Constipation 01/25/2016  . Contusion 05/18/2014   Of foot  . Depression 02/13/2017  . Headache 04/09/2017  . History of kidney stones   . Hyperglycemia 05/18/2015  . Hyperlipidemia   . Hypertension   . Incomplete quadriplegia at C5-6 level ([place]) 07/24/2008   Qualifier: Diagnosis of  By: Wynona Luna Requires bowel regimen including enemas and indwelling foley cath Has had tendency towards pressure sores in past. Had a tear with a transfer in Hospital in past for surgery and developed a sore at that time.   . Medicare annual wellness visit, subsequent 11/02/2014   Follows with [facility] Declines colonoscopy  No h/o abnormal pap, declines further paps    . Mitral valve disorder 12/01/2012  . Neurogenic bladder   . Obesity   . Obesity 11/01/2016  . Obstructive sleep apnea 01/31/2016  . Pain in joint, shoulder region 12/01/2012   Long history B/l Follows with Dr Hassell Done at [facility]  . Postmenopausal bleeding 12/17/2012  . Preventative health care 06/26/2013  . Quadriplegia ([place]) 1981   Secondary to ruptured AVM C5-C6  . Sacral pressure ulcer 12/01/2012   H/o Follows with Dr Tomi Likens of Dermatology  . Skin cancer 11/01/2016   2 [facility] RLE excised by Dr Wilhemina Bonito 2018 [place] LLE excised by Dr Wilhemina Bonito 2018  . Vaginitis and vulvovaginitis 12/17/2012    Past Surgical History:  Procedure Laterality Date  . COLOSTOMY  02/22/2016  . HERNIA REPAIR  2297   umbilical hernia repair  . LAMINECTOMY  1981   AVM  . [place]   left shoulder  . SHOULDER ARTHROSCOPY  2008   right  . TOTAL SHOULDER REPLACEMENT  2001   left  . TRANSFER TENDON HAND  1982, 1983   to right hand  . VENTRAL HERNIA REPAIR  08/17/2010   Postop ileus (Dr. Redmond Pulling)    Family History  Problem Relation Age of Onset  . Cancer Mother        breast & melanoma  . Hypertension Mother   . Heart disease Father        CAD, MI, AFib  . Cancer Sister        melanoma  .  Cancer Maternal Grandfather        melanoma  . Stroke Paternal Grandmother   . Heart disease Paternal Grandfather   . Cancer Maternal Aunt        breast  . Cancer Paternal Aunt        breast  . Cancer Paternal Uncle        breast  . Cancer Paternal Aunt        breast    Social History   Socioeconomic History  . Marital status: Single    Spouse name: Not on file  . Number of children: Not on file  . Years of education: Not on file  . Highest education level: Not on file  Occupational History  . Occupation: Disabled    Comment: Music therapist  Social Needs  . Financial resource strain: Not on file  . Food insecurity:    Worry: Not on file    Inability: Not on file  .  Transportation needs:    Medical: Not on file    Non-medical: Not on file  Tobacco Use  . Smoking status: Never Smoker  . Smokeless tobacco: Never Used  Substance and Sexual Activity  . Alcohol use: No  . Drug use: No  . Sexual activity: Not on file    Comment: lives with sister and mother, no major dietary restrictions  Lifestyle  . Physical activity:    Days per week: Not on file    Minutes per session: Not on file  . Stress: Not on file  Relationships  . Social connections:    Talks on phone: Not on file    Gets together: Not on file    Attends religious service: Not on file    Active member of club or organization: Not on file    Attends meetings of clubs or organizations: Not on file    Relationship status: Not on file  . Intimate partner violence:    Fear of current or ex partner: Not on file    Emotionally abused: Not on file    Physically abused: Not on file    Forced sexual activity: Not on file  Other Topics Concern  . Not on file  Social History Narrative   Sister is primary caregiver    Outpatient Medications Prior to Visit  Medication Sig Dispense Refill  . clobetasol (TEMOVATE) 0.05 % external solution Apply topically.    . docusate sodium (COLACE) 100 MG capsule Take 1 tablet by mouth daily.    Marland Kitchen FIBER PO Take 1 capsule by mouth daily.    . methylcellulose (FIBER THERAPY) oral powder Take 1 capsule by mouth daily.    . Multiple Vitamins-Calcium (ONE-A-DAY WOMENS FORMULA PO) Take 1 tablet by mouth daily.      . mupirocin ointment (BACTROBAN) 2 % Place 1 application into the nose 2 (two) times daily. 22 g 1  . oxybutynin (DITROPAN) 5 MG tablet Take 5 mg by mouth every morning.      . Probiotic Product (DIGESTIVE ADVANTAGE) CAPS Take 1 tablet by mouth daily.    . rosuvastatin (CRESTOR) 5 MG tablet TAKE 1 TABLET BY MOUTH EVERY OTHER DAY 15 tablet 3  . baclofen (LIORESAL) 20 MG tablet Take 0.5-1 tablets (10-20 mg total) by mouth 4 (four) times daily as needed  for muscle spasms. 90 each 3  . FLUoxetine (PROZAC) 10 MG tablet TAKE 2 TABLETS BY MOUTH DAILY 60 tablet 1  . furosemide (LASIX) 20 MG tablet TAKE 1 TABLET BY  MOUTH TWICE DAILY 180 tablet 1  . potassium chloride SA (K-DUR,KLOR-CON) 20 MEQ tablet TAKE 2 TABLETS BY MOUTH TWICE DAILY 360 tablet 1  . FLUoxetine (PROZAC) 20 MG tablet Take 1 tablet (20 mg total) by mouth daily. 1/2 tab po daily then increase to 1 tab po daily 30 tablet 3  . metroNIDAZOLE (METROCREAM) 0.75 % cream Apply topically 2 (two) times daily.     No facility-administered medications prior to visit.     Allergies  Allergen Reactions  . Adhesive [Tape] Other (See Comments)    Skin Irritant.    Review of Systems  Constitutional: Negative for fever and malaise/fatigue.  HENT: Negative for congestion.   Eyes: Negative for blurred vision.  Respiratory: Negative for shortness of breath.   Cardiovascular: Negative for chest pain, palpitations and leg swelling.  Gastrointestinal: Negative for abdominal pain, blood in stool and nausea.  Genitourinary: Negative for dysuria and frequency.  Musculoskeletal: Negative for falls.  Skin: Negative for rash.  Neurological: Positive for sensory change, focal weakness and weakness. Negative for dizziness, loss of consciousness and headaches.  Endo/Heme/Allergies: Negative for environmental allergies.  Psychiatric/Behavioral: Negative for depression. The patient is not nervous/anxious.        Objective:    Physical Exam  Constitutional: She is oriented to person, place, and time. She appears well-developed and well-nourished. No distress.  HENT:  Head: Normocephalic and atraumatic.  Nose: Nose normal.  Eyes: Right eye exhibits no discharge. Left eye exhibits no discharge.  Neck: Normal range of motion. Neck supple.  Cardiovascular: Normal rate and regular rhythm.  No murmur heard. Pulmonary/Chest: Effort normal and breath sounds normal.  Abdominal: Soft. Bowel sounds are  normal. There is no tenderness.  Musculoskeletal: She exhibits no edema.  Neurological: She is alert and oriented to person, place, and time.  Skin: Skin is warm and dry.  Scab at base of occiput. No surrounding erythema or rash  Psychiatric: She has a normal mood and affect.  Nursing note and vitals reviewed.   BP (!) 142/78   Pulse 62   Temp 98.3 F (36.8 C) (Oral)   Resp 18   Wt 221 lb (100.2 kg)   SpO2 98%   BMI 29.97 kg/m  Wt Readings from Last 3 Encounters:  11/16/17 221 lb (100.2 kg)  05/06/17 225 lb (102.1 kg)  10/31/16 221 lb (100.2 kg)   BP Readings from Last 3 Encounters:  11/16/17 (!) 142/78  08/07/17 (!) 149/84  08/07/17 (!) 150/86     Immunization History  Administered Date(s) Administered  . H1N1 07/24/2008  . Influenza Split 04/05/2011, 04/18/2012  . Influenza Whole 06/03/2008, 03/31/2009, 04/01/2010  . Influenza,inj,Quad PF,6+ Mos 03/20/2013, 03/03/2014, 05/18/2015, 04/20/2016, 04/06/2017  . Pneumococcal Conjugate-13 06/24/2013  . Pneumococcal Polysaccharide-23 12/07/2005  . Td 06/03/2008  . Zoster 11/26/2012    Health Maintenance  Topic Date Due  . HIV Screening  10/30/1967  . DEXA SCAN  10/29/2017  . PNA vac Low Risk Adult (2 of 2 - PPSV23) 10/29/2017  . INFLUENZA VACCINE  01/11/2018  . MAMMOGRAM  04/18/2018  . Fecal DNA (Cologuard)  05/23/2018  . TETANUS/TDAP  06/03/2018  . PAP SMEAR  05/19/2019  . Hepatitis C Screening  Completed    Lab Results  Component Value Date   WBC 7.2 08/07/2017   HGB 13.9 08/07/2017   HCT 42.0 08/07/2017   PLT 224.0 08/07/2017   GLUCOSE 94 08/07/2017   CHOL 181 08/07/2017   TRIG 141.0 08/07/2017   HDL 38.50 (  L) 08/07/2017   LDLDIRECT 105.0 02/06/2017   LDLCALC 115 (H) 08/07/2017   ALT 21 08/07/2017   AST 15 08/07/2017   NA 139 08/07/2017   K 4.7 08/07/2017   CL 103 08/07/2017   CREATININE 0.57 08/07/2017   BUN 8 08/07/2017   CO2 30 08/07/2017   TSH 2.28 08/07/2017   HGBA1C 5.9 08/07/2017    Lab  Results  Component Value Date   TSH 2.28 08/07/2017   Lab Results  Component Value Date   WBC 7.2 08/07/2017   HGB 13.9 08/07/2017   HCT 42.0 08/07/2017   MCV 82.0 08/07/2017   PLT 224.0 08/07/2017   Lab Results  Component Value Date   NA 139 08/07/2017   K 4.7 08/07/2017   CO2 30 08/07/2017   GLUCOSE 94 08/07/2017   BUN 8 08/07/2017   CREATININE 0.57 08/07/2017   BILITOT 0.4 08/07/2017   ALKPHOS 86 08/07/2017   AST 15 08/07/2017   ALT 21 08/07/2017   PROT 7.3 08/07/2017   ALBUMIN 3.8 08/07/2017   CALCIUM 9.4 08/07/2017   GFR 113.22 08/07/2017   Lab Results  Component Value Date   CHOL 181 08/07/2017   Lab Results  Component Value Date   HDL 38.50 (L) 08/07/2017   Lab Results  Component Value Date   LDLCALC 115 (H) 08/07/2017   Lab Results  Component Value Date   TRIG 141.0 08/07/2017   Lab Results  Component Value Date   CHOLHDL 5 08/07/2017   Lab Results  Component Value Date   HGBA1C 5.9 08/07/2017         Assessment & Plan:   Problem List Items Addressed This Visit    Hyperlipidemia    Encouraged heart healthy diet, increase exercise, avoid trans fats, consider a krill oil cap daily      Relevant Medications   furosemide (LASIX) 20 MG tablet   Anemia    resolved      Essential hypertension    Well controlled, no changes to meds. Encouraged heart healthy diet such as the DASH diet and exercise as tolerated.       Relevant Medications   furosemide (LASIX) 20 MG tablet   Hyperglycemia    hgba1c acceptable, minimize simple carbs. Increase exercise as tolerated.          I have discontinued Nevaya A. Jonsson's metroNIDAZOLE and FLUoxetine. I have also changed her furosemide, potassium chloride SA, and FLUoxetine. Additionally, I am having her maintain her Multiple Vitamins-Calcium (ONE-A-DAY WOMENS FORMULA PO), oxybutynin, clobetasol, DIGESTIVE ADVANTAGE, methylcellulose, docusate sodium, mupirocin ointment, FIBER PO, rosuvastatin, and  baclofen.  Meds ordered this encounter  Medications  . baclofen (LIORESAL) 20 MG tablet    Sig: Take 0.5-1 tablets (10-20 mg total) by mouth 4 (four) times daily as needed for muscle spasms.    Dispense:  90 each    Refill:  3  . furosemide (LASIX) 20 MG tablet    Sig: Take 1 tablet (20 mg total) by mouth 2 (two) times daily.    Dispense:  180 tablet    Refill:  1  . potassium chloride SA (K-DUR,KLOR-CON) 20 MEQ tablet    Sig: Take 2 tablets (40 mEq total) by mouth 2 (two) times daily.    Dispense:  360 tablet    Refill:  1  . FLUoxetine (PROZAC) 10 MG tablet    Sig: Take 2 tablets (20 mg total) by mouth daily.    Dispense:  60 tablet    Refill:  1    CMA served as Education administrator during this visit. History, Physical and Plan performed by medical provider. Documentation and orders reviewed and attested to.  Penni Homans, MD

## 2017-11-16 NOTE — Assessment & Plan Note (Signed)
resolved 

## 2017-11-16 NOTE — Assessment & Plan Note (Signed)
hgba1c acceptable, minimize simple carbs. Increase exercise as tolerated.  

## 2017-11-16 NOTE — Assessment & Plan Note (Signed)
Well controlled, no changes to meds. Encouraged heart healthy diet such as the DASH diet and exercise as tolerated.  °

## 2017-11-16 NOTE — Patient Instructions (Addendum)
MIND diet   Shingrix is the new shingles shot 2 shots over 2-6 months at pharmacy or here at [facility]. Call insurance and confirm where it is cheapest.  Hypertension Hypertension is another name for high blood pressure. High blood pressure forces your heart to work harder to pump blood. This can cause problems over time. There are two numbers in a blood pressure reading. There is a top number (systolic) over a bottom number (diastolic). It is best to have a blood pressure below 120/80. Healthy choices can help lower your blood pressure. You may need medicine to help lower your blood pressure if:  Your blood pressure cannot be lowered with healthy choices.  Your blood pressure is higher than 130/80.  Follow these instructions at home: Eating and drinking  If directed, follow the DASH eating plan. This diet includes: ? Filling half of your plate at each meal with fruits and vegetables. ? Filling one quarter of your plate at each meal with whole grains. Whole grains include whole wheat pasta, brown rice, and whole grain bread. ? Eating or drinking low-fat dairy products, such as skim milk or low-fat yogurt. ? Filling one quarter of your plate at each meal with low-fat (lean) proteins. Low-fat proteins include fish, skinless chicken, eggs, beans, and tofu. ? Avoiding fatty meat, cured and processed meat, or chicken with skin. ? Avoiding premade or processed food.  Eat less than 1,500 mg of salt (sodium) a day.  Limit alcohol use to no more than 1 drink a day for nonpregnant women and 2 drinks a day for men. One drink equals 12 oz of beer, 5 oz of wine, or 1 oz of hard liquor. Lifestyle  Work with your doctor to stay at a healthy weight or to lose weight. Ask your doctor what the best weight is for you.  Get at least 30 minutes of exercise that causes your heart to beat faster (aerobic exercise) most days of the week. This may include walking, swimming, or biking.  Get at least 30 minutes of  exercise that strengthens your muscles (resistance exercise) at least 3 days a week. This may include lifting weights or pilates.  Do not use any products that contain nicotine or tobacco. This includes cigarettes and e-cigarettes. If you need help quitting, ask your doctor.  Check your blood pressure at home as told by your doctor.  Keep all follow-up visits as told by your doctor. This is important. Medicines  Take over-the-counter and prescription medicines only as told by your doctor. Follow directions carefully.  Do not skip doses of blood pressure medicine. The medicine does not work as well if you skip doses. Skipping doses also puts you at risk for problems.  Ask your doctor about side effects or reactions to medicines that you should watch for. Contact a doctor if:  You think you are having a reaction to the medicine you are taking.  You have headaches that keep coming back (recurring).  You feel dizzy.  You have swelling in your ankles.  You have trouble with your vision. Get help right away if:  You get a very bad headache.  You start to feel confused.  You feel weak or numb.  You feel faint.  You get very bad pain in your: ? Chest. ? Belly (abdomen).  You throw up (vomit) more than once.  You have trouble breathing. Summary  Hypertension is another name for high blood pressure.  Making healthy choices can help lower blood pressure. If  your blood pressure cannot be controlled with healthy choices, you may need to take medicine. This information is not intended to replace advice given to you by your health care provider. Make sure you discuss any questions you have with your health care provider. Document Released: 11/16/2007 Document Revised: 04/27/2016 Document Reviewed: 04/27/2016 Elsevier Interactive Patient Education  [employer].

## 2018-02-19 ENCOUNTER — Ambulatory Visit (INDEPENDENT_AMBULATORY_CARE_PROVIDER_SITE_OTHER): Payer: Medicare Other | Admitting: Family Medicine

## 2018-02-19 VITALS — BP 134/72 | HR 61 | Temp 98.1°F | Resp 18 | Wt 225.0 lb

## 2018-02-19 DIAGNOSIS — R739 Hyperglycemia, unspecified: Secondary | ICD-10-CM | POA: Diagnosis not present

## 2018-02-19 DIAGNOSIS — F32A Depression, unspecified: Secondary | ICD-10-CM

## 2018-02-19 DIAGNOSIS — Z23 Encounter for immunization: Secondary | ICD-10-CM | POA: Diagnosis not present

## 2018-02-19 DIAGNOSIS — F329 Major depressive disorder, single episode, unspecified: Secondary | ICD-10-CM

## 2018-02-19 DIAGNOSIS — E782 Mixed hyperlipidemia: Secondary | ICD-10-CM | POA: Diagnosis not present

## 2018-02-19 DIAGNOSIS — I10 Essential (primary) hypertension: Secondary | ICD-10-CM

## 2018-02-19 DIAGNOSIS — Z933 Colostomy status: Secondary | ICD-10-CM | POA: Insufficient documentation

## 2018-02-19 DIAGNOSIS — M25551 Pain in right hip: Secondary | ICD-10-CM

## 2018-02-19 MED ORDER — FLUOXETINE HCL 10 MG PO TABS: 10.0000 mg | ORAL_TABLET | Freq: Every day | ORAL | 2 refills | Status: DC

## 2018-02-19 NOTE — Assessment & Plan Note (Signed)
Well controlled, no changes to meds. Encouraged heart healthy diet such as the DASH diet and exercise as tolerated.  °

## 2018-02-19 NOTE — Progress Notes (Signed)
Subjective:    Patient ID: [name], female    DOB: [birth year], 67 y.o.   MRN: [id number]  No chief complaint on file.   HPI Patient is in today for a follow up visit. She reports no complaints. Adherence to all medications is good. Still lives at home with her sister.   Past Medical History:  Diagnosis Date  . Acute bronchitis 05/16/2015  . Arthritis   . Bowel dysfunction    constipation and fecal retention  . Cerumen impaction 10/11/2015  . Constipation 01/25/2016  . Contusion 05/18/2014   Of foot  . Depression 02/13/2017  . Headache 04/09/2017  . History of kidney stones   . Hyperglycemia 05/18/2015  . Hyperlipidemia   . Hypertension   . Incomplete quadriplegia at C5-6 level ([place]) 07/24/2008   Qualifier: Diagnosis of  By: Wynona Luna Requires bowel regimen including enemas and indwelling foley cath Has had tendency towards pressure sores in past. Had a tear with a transfer in Hospital in past for surgery and developed a sore at that time.   . Medicare annual wellness visit, subsequent 11/02/2014   Follows with [facility] Declines colonoscopy  No h/o abnormal pap, declines further paps  . Mitral valve disorder 12/01/2012  . Neurogenic bladder   . Obesity   . Obesity 11/01/2016  . Obstructive sleep apnea 01/31/2016  . Pain in joint, shoulder region 12/01/2012   Long history B/l Follows with Dr Hassell [facility] at [facility]  . Postmenopausal bleeding 12/17/2012  . Preventative health care 06/26/2013  . Quadriplegia ([place]) 1981   Secondary to ruptured AVM C5-C6  . Sacral pressure ulcer 12/01/2012   H/o Follows with Dr Tomi Likens of Dermatology  . Skin cancer 11/01/2016   2 [facility] RLE excised by Dr Wilhemina Bonito 2018 [place] LLE excised by Dr Wilhemina Bonito 2018  . Vaginitis and vulvovaginitis 12/17/2012    Past Surgical History:  Procedure Laterality Date  . COLOSTOMY  02/22/2016  . HERNIA REPAIR  4098   umbilical hernia repair  . LAMINECTOMY  1981   AVM  . [place]   left shoulder  . SHOULDER ARTHROSCOPY  2008   right  . TOTAL SHOULDER REPLACEMENT  2001   left  . TRANSFER TENDON HAND  1982, 1983   to right hand  . VENTRAL HERNIA REPAIR  08/17/2010   Postop ileus (Dr. Redmond Pulling)    Family History  Problem Relation Age of Onset  . Cancer Mother        breast & melanoma  . Hypertension Mother   . Heart disease Father        CAD, MI, AFib  . Cancer Sister        melanoma  . Cancer Maternal Grandfather        melanoma  . Stroke Paternal Grandmother   . Heart disease Paternal Grandfather   . Cancer Maternal Aunt        breast  . Cancer Paternal Aunt        breast  . Cancer Paternal Uncle        breast  . Cancer Paternal Aunt        breast    Social History   Socioeconomic History  . Marital status: Single    Spouse name: Not on file  . Number of children: Not on file  . Years of education: Not on file  . Highest education level: Not on file  Occupational History  . Occupation: Disabled    Comment: Music therapist  Social Needs  . Financial resource strain: Not on file  . Food insecurity:    Worry: Not on file    Inability: Not on file  . Transportation needs:    Medical: Not on file    Non-medical: Not on file  Tobacco Use  . Smoking status: Never Smoker  . Smokeless tobacco: Never Used  Substance and Sexual Activity  . Alcohol use: No  . Drug use: No  . Sexual activity: Not on file    Comment: lives with sister and mother, no major dietary restrictions  Lifestyle  . Physical activity:    Days per week: Not on file    Minutes per session: Not on file  . Stress: Not on file  Relationships  . Social connections:    Talks on phone: Not on file    Gets together: Not on file    Attends religious service: Not on file    Active member of club or organization: Not on file    Attends meetings of clubs or organizations: Not on file    Relationship status: Not on file  . Intimate partner violence:    Fear of  current or ex partner: Not on file    Emotionally abused: Not on file    Physically abused: Not on file    Forced sexual activity: Not on file  Other Topics Concern  . Not on file  Social History Narrative   Sister is primary caregiver    Outpatient Medications Prior to Visit  Medication Sig Dispense Refill  . baclofen (LIORESAL) 20 MG tablet Take 0.5-1 tablets (10-20 mg total) by mouth 4 (four) times daily as needed for muscle spasms. 90 each 3  . clobetasol (TEMOVATE) 0.05 % external solution Apply topically.    . docusate sodium (COLACE) 100 MG capsule Take 1 tablet by mouth daily.    Marland Kitchen FIBER PO Take 1 capsule by mouth daily.    . furosemide (LASIX) 20 MG tablet Take 1 tablet (20 mg total) by mouth 2 (two) times daily. 180 tablet 1  . methylcellulose (FIBER THERAPY) oral powder Take 1 capsule by mouth daily.    . Multiple Vitamins-Calcium (ONE-A-DAY WOMENS FORMULA PO) Take 1 tablet by mouth daily.      . mupirocin ointment (BACTROBAN) 2 % Place 1 application into the nose 2 (two) times daily. 22 g 1  . oxybutynin (DITROPAN) 5 MG tablet Take 5 mg by mouth every morning.      . potassium chloride SA (K-DUR,KLOR-CON) 20 MEQ tablet Take 2 tablets (40 mEq total) by mouth 2 (two) times daily. 360 tablet 1  . Probiotic Product (DIGESTIVE ADVANTAGE) CAPS Take 1 tablet by mouth daily.    . rosuvastatin (CRESTOR) 5 MG tablet TAKE 1 TABLET BY MOUTH EVERY OTHER DAY 15 tablet 3  . FLUoxetine (PROZAC) 10 MG tablet Take 2 tablets (20 mg total) by mouth daily. 60 tablet 1   No facility-administered medications prior to visit.     Allergies  Allergen Reactions  . Adhesive [Tape] Other (See Comments)    Skin Irritant. Does not tolerate [employer], tolerates [place]    ROS Constitutional: Negative for fever and malaise/fatigue.  HENT: Negative for congestion.   Eyes: Negative for blurred vision.  Respiratory: Negative for shortness of breath.   Cardiovascular: Negative for chest pain,  palpitations and leg swelling.  Gastrointestinal: Negative for abdominal pain, blood in stool and nausea.  Genitourinary: Negative for dysuria and frequency.  Musculoskeletal: Negative for falls.  Skin: Negative for rash.  Neurological: Negative for sensory change. Negative for dizziness, loss of consciousness and headaches.  Endo/Heme/Allergies: Negative for environmental allergies.  Psychiatric/Behavioral: Negative for depression. The patient is not nervous/anxious.     Objective:    Physical Exam Constitutional: She is oriented to person, place, and time. She appears well-developed and well-nourished. No distress.  HENT:  Head: Normocephalic and atraumatic.  Nose: Nose normal.  Eyes: Right eye exhibits no discharge. Left eye exhibits no discharge.  Neck: Normal range of motion. Neck supple.  Cardiovascular: Normal rate and regular rhythm.  No murmur heard. Pulmonary/Chest: Effort normal and breath sounds normal.  Abdominal: Soft. Bowel sounds are normal. There is no tenderness.  Musculoskeletal: She exhibits no edema.  Neurological: She is alert and oriented to person, place, and time.  Skin: Skin is warm and dry.   Psychiatric: She has a normal mood and affect.   BP 134/72 (BP Location: Left Arm, Patient Position: Sitting, Cuff Size: Normal)   Pulse 61   Temp 98.1 F (36.7 C) (Oral)   Resp 18   Wt 225 lb (102.1 kg)   SpO2 98%   BMI 30.52 kg/m  Wt Readings from Last 3 Encounters:  02/19/18 225 lb (102.1 kg)  11/16/17 221 lb (100.2 kg)  05/06/17 225 lb (102.1 kg)     Lab Results  Component Value Date   WBC 7.2 08/07/2017   HGB 13.9 08/07/2017   HCT 42.0 08/07/2017   PLT 224.0 08/07/2017   GLUCOSE 94 08/07/2017   CHOL 181 08/07/2017   TRIG 141.0 08/07/2017   HDL 38.50 (L) 08/07/2017   LDLDIRECT 105.0 02/06/2017   LDLCALC 115 (H) 08/07/2017   ALT 21 08/07/2017   AST 15 08/07/2017   NA 139 08/07/2017   K 4.7 08/07/2017   CL 103 08/07/2017   CREATININE 0.57  08/07/2017   BUN 8 08/07/2017   CO2 30 08/07/2017   TSH 2.28 08/07/2017   HGBA1C 5.9 08/07/2017    Lab Results  Component Value Date   TSH 2.28 08/07/2017   Lab Results  Component Value Date   WBC 7.2 08/07/2017   HGB 13.9 08/07/2017   HCT 42.0 08/07/2017   MCV 82.0 08/07/2017   PLT 224.0 08/07/2017   Lab Results  Component Value Date   NA 139 08/07/2017   K 4.7 08/07/2017   CO2 30 08/07/2017   GLUCOSE 94 08/07/2017   BUN 8 08/07/2017   CREATININE 0.57 08/07/2017   BILITOT 0.4 08/07/2017   ALKPHOS 86 08/07/2017   AST 15 08/07/2017   ALT 21 08/07/2017   PROT 7.3 08/07/2017   ALBUMIN 3.8 08/07/2017   CALCIUM 9.4 08/07/2017   GFR 113.22 08/07/2017   Lab Results  Component Value Date   CHOL 181 08/07/2017   Lab Results  Component Value Date   HDL 38.50 (L) 08/07/2017   Lab Results  Component Value Date   LDLCALC 115 (H) 08/07/2017   Lab Results  Component Value Date   TRIG 141.0 08/07/2017   Lab Results  Component Value Date   CHOLHDL 5 08/07/2017   Lab Results  Component Value Date   HGBA1C 5.9 08/07/2017       Assessment & Plan:   Hyperlipidemia: No changes in medication. Check lipids today.  HTN:  Well controlled.   Hyperglycemia: Last A1c 5.9. Recheck today.      I have changed Saphronia A. Keetch's FLUoxetine.  I am also having her maintain her Multiple Vitamins-Calcium (ONE-A-DAY WOMENS FORMULA PO), oxybutynin, clobetasol, DIGESTIVE ADVANTAGE, methylcellulose, docusate sodium, mupirocin ointment, FIBER PO, rosuvastatin, baclofen, furosemide, and potassium chloride SA.  Meds ordered this encounter  Medications  . FLUoxetine (PROZAC) 10 MG tablet    Sig: Take 1 tablet (10 mg total) by mouth daily.    Dispense:  30 tablet    Refill:  2    Verdis Frederickson, Medical Student  Patient seen with and examined with student.  Agree with documentation See separate note for further documentation Penni Homans, MD

## 2018-02-19 NOTE — Assessment & Plan Note (Signed)
hgba1c acceptable, minimize simple carbs. Increase exercise as tolerated.  

## 2018-02-19 NOTE — Patient Instructions (Addendum)
Shingrix is the new shingles shot, 2 shots over 2-6 months at pharmacy Hypertension Hypertension, commonly called high blood pressure, is when the force of blood pumping through the arteries is too strong. The arteries are the blood vessels that carry blood from the heart throughout the body. Hypertension forces the heart to work harder to pump blood and may cause arteries to become narrow or stiff. Having untreated or uncontrolled hypertension can cause heart attacks, strokes, kidney disease, and other problems. A blood pressure reading consists of a higher number over a lower number. Ideally, your blood pressure should be below 120/80. The first ("top") number is called the systolic pressure. It is a measure of the pressure in your arteries as your heart beats. The second ("bottom") number is called the diastolic pressure. It is a measure of the pressure in your arteries as the heart relaxes. What are the causes? The cause of this condition is not known. What increases the risk? Some risk factors for high blood pressure are under your control. Others are not. Factors you can change  Smoking.  Having type 2 diabetes mellitus, high cholesterol, or both.  Not getting enough exercise or physical activity.  Being overweight.  Having too much fat, sugar, calories, or salt (sodium) in your diet.  Drinking too much alcohol. Factors that are difficult or impossible to change  Having chronic kidney disease.  Having a family history of high blood pressure.  Age. Risk increases with age.  Race. You may be at higher risk if you are African-American.  Gender. Men are at higher risk than women before age 65. After age 46, women are at higher risk than men.  Having obstructive sleep apnea.  Stress. What are the signs or symptoms? Extremely high blood pressure (hypertensive crisis) may cause:  Headache.  Anxiety.  Shortness of breath.  Nosebleed.  Nausea and vomiting.  Severe chest  pain.  Jerky movements you cannot control (seizures).  How is this diagnosed? This condition is diagnosed by measuring your blood pressure while you are seated, with your arm resting on a surface. The cuff of the blood pressure monitor will be placed directly against the skin of your upper arm at the level of your heart. It should be measured at least twice using the same arm. Certain conditions can cause a difference in blood pressure between your right and left arms. Certain factors can cause blood pressure readings to be lower or higher than normal (elevated) for a short period of time:  When your blood pressure is higher when you are in a health care provider's office than when you are at home, this is called white coat hypertension. Most people with this condition do not need medicines.  When your blood pressure is higher at home than when you are in a health care provider's office, this is called masked hypertension. Most people with this condition may need medicines to control blood pressure.  If you have a high blood pressure reading during one visit or you have normal blood pressure with other risk factors:  You may be asked to return on a different day to have your blood pressure checked again.  You may be asked to monitor your blood pressure at home for 1 week or longer.  If you are diagnosed with hypertension, you may have other blood or imaging tests to help your health care provider understand your overall risk for other conditions. How is this treated? This condition is treated by making healthy lifestyle changes,  such as eating healthy foods, exercising more, and reducing your alcohol intake. Your health care provider may prescribe medicine if lifestyle changes are not enough to get your blood pressure under control, and if:  Your systolic blood pressure is above 130.  Your diastolic blood pressure is above 80.  Your personal target blood pressure may vary depending on your  medical conditions, your age, and other factors. Follow these instructions at home: Eating and drinking  Eat a diet that is high in fiber and potassium, and low in sodium, added sugar, and fat. An example eating plan is called the DASH (Dietary Approaches to Stop Hypertension) diet. To eat this way: ? Eat plenty of fresh fruits and vegetables. Try to fill half of your plate at each meal with fruits and vegetables. ? Eat whole grains, such as whole wheat pasta, brown rice, or whole grain bread. Fill about one quarter of your plate with whole grains. ? Eat or drink low-fat dairy products, such as skim milk or low-fat yogurt. ? Avoid fatty cuts of meat, processed or cured meats, and poultry with skin. Fill about one quarter of your plate with lean proteins, such as fish, chicken without skin, beans, eggs, and tofu. ? Avoid premade and processed foods. These tend to be higher in sodium, added sugar, and fat.  Reduce your daily sodium intake. Most people with hypertension should eat less than 1,500 mg of sodium a day.  Limit alcohol intake to no more than 1 drink a day for nonpregnant women and 2 drinks a day for men. One drink equals 12 oz of beer, 5 oz of wine, or 1 oz of hard liquor. Lifestyle  Work with your health care provider to maintain a healthy body weight or to lose weight. Ask what an ideal weight is for you.  Get at least 30 minutes of exercise that causes your heart to beat faster (aerobic exercise) most days of the week. Activities may include walking, swimming, or biking.  Include exercise to strengthen your muscles (resistance exercise), such as pilates or lifting weights, as part of your weekly exercise routine. Try to do these types of exercises for 30 minutes at least 3 days a week.  Do not use any products that contain nicotine or tobacco, such as cigarettes and e-cigarettes. If you need help quitting, ask your health care provider.  Monitor your blood pressure at home as  told by your health care provider.  Keep all follow-up visits as told by your health care provider. This is important. Medicines  Take over-the-counter and prescription medicines only as told by your health care provider. Follow directions carefully. Blood pressure medicines must be taken as prescribed.  Do not skip doses of blood pressure medicine. Doing this puts you at risk for problems and can make the medicine less effective.  Ask your health care provider about side effects or reactions to medicines that you should watch for. Contact a health care provider if:  You think you are having a reaction to a medicine you are taking.  You have headaches that keep coming back (recurring).  You feel dizzy.  You have swelling in your ankles.  You have trouble with your vision. Get help right away if:  You develop a severe headache or confusion.  You have unusual weakness or numbness.  You feel faint.  You have severe pain in your chest or abdomen.  You vomit repeatedly.  You have trouble breathing. Summary  Hypertension is when the force of  blood pumping through your arteries is too strong. If this condition is not controlled, it may put you at risk for serious complications.  Your personal target blood pressure may vary depending on your medical conditions, your age, and other factors. For most people, a normal blood pressure is less than 120/80.  Hypertension is treated with lifestyle changes, medicines, or a combination of both. Lifestyle changes include weight loss, eating a healthy, low-sodium diet, exercising more, and limiting alcohol. This information is not intended to replace advice given to you by your health care provider. Make sure you discuss any questions you have with your health care provider. Document Released: 05/30/2005 Document Revised: 04/27/2016 Document Reviewed: 04/27/2016 Elsevier Interactive Patient Education  [employer].

## 2018-02-19 NOTE — Assessment & Plan Note (Signed)
Managing with Fluoxetine 10 mg daily does not feel the 20 mg dose helped any more so she dropped back further.

## 2018-02-19 NOTE — Progress Notes (Signed)
Subjective:  I acted as a Education administrator for Dr. Charlett Blake. [name], [name]  Patient ID: [name], female    DOB: 1950/10/11, 67 y.o.   MRN: [id number]  No chief complaint on file.   HPI  Patient is in today for 3 month follow up and overall she is doing well. She continues to live at home with he sister who"s health is also starting to fail. She has done better since having her colostomy placed. No recent febrile illness or hospitalizations. Denies CP/palp/SOB/HA/congestion/fevers/GI or GU c/o. Taking meds as prescribed  Patient Care Team: Mosie Lukes, MD as PCP - General (Family Medicine) Irine Seal, MD as Attending Physician (Urology) Clent Jacks, MD as Consulting Physician (Ophthalmology) Laurance Flatten, MD as Consulting Physician (Orthopedic Surgery) Cheryll Cockayne, MD as Consulting Physician (Surgical Oncology) Ronald Lobo, MD as Consulting Physician (Gastroenterology) Danella Sensing, MD as Consulting Physician (Dermatology)   Past Medical History:  Diagnosis Date  . Acute bronchitis 05/16/2015  . Arthritis   . Bowel dysfunction    constipation and fecal retention  . Cerumen impaction 10/11/2015  . Constipation 01/25/2016  . Contusion 05/18/2014   Of foot  . Depression 02/13/2017  . Headache 04/09/2017  . History of kidney stones   . Hyperglycemia 05/18/2015  . Hyperlipidemia   . Hypertension   . Incomplete quadriplegia at C5-6 level ([place]) 07/24/2008   Qualifier: Diagnosis of  By: Wynona Luna Requires bowel regimen including enemas and indwelling foley cath Has had tendency towards pressure sores in past. Had a tear with a transfer in Hospital in past for surgery and developed a sore at that time.   . Medicare annual wellness visit, subsequent 11/02/2014   Follows with [facility] Declines colonoscopy  No h/o abnormal pap, declines further paps  . Mitral valve disorder 12/01/2012  . Neurogenic bladder   . Obesity   . Obesity 11/01/2016  . Obstructive sleep apnea  01/31/2016  . Pain in joint, shoulder region 12/01/2012   Long history B/l [facility]  . Postmenopausal bleeding 12/17/2012  . Preventative health care 06/26/2013  . Quadriplegia ([place]) 1981   Secondary to ruptured AVM C5-C6  . Sacral pressure ulcer 12/01/2012   H/o Follows with Dr Tomi Likens of Dermatology  . Skin cancer 11/01/2016   2 [facility] RLE excised by Dr Wilhemina Bonito 2018 [place] LLE excised by Dr Wilhemina Bonito 2018  . Vaginitis and vulvovaginitis 12/17/2012    Past Surgical History:  Procedure Laterality Date  . COLOSTOMY  02/22/2016  . HERNIA REPAIR  5726   umbilical hernia repair  . LAMINECTOMY  1981   AVM  . [place]   left shoulder  . SHOULDER ARTHROSCOPY  2008   right  . TOTAL SHOULDER REPLACEMENT  2001   left  . TRANSFER TENDON HAND  1982, 1983   to right hand  . VENTRAL HERNIA REPAIR  08/17/2010   Postop ileus (Dr. Redmond Pulling)    Family History  Problem Relation Age of Onset  . Cancer Mother        breast & melanoma  . Hypertension Mother   . Heart disease Father        CAD, MI, AFib  . Cancer Sister        melanoma  . Cancer Maternal Grandfather        melanoma  . Stroke Paternal Grandmother   . Heart disease Paternal Grandfather   .  Cancer Maternal Aunt        breast  . Cancer Paternal Aunt        breast  . Cancer Paternal Uncle        breast  . Cancer Paternal Aunt        breast    Social History   Socioeconomic History  . Marital status: Single    Spouse name: Not on file  . Number of children: Not on file  . Years of education: Not on file  . Highest education level: Not on file  Occupational History  . Occupation: Disabled    Comment: Music therapist  Social Needs  . Financial resource strain: Not on file  . Food insecurity:    Worry: Not on file    Inability: Not on file  . Transportation needs:    Medical: Not on file    Non-medical: Not on file  Tobacco Use  . Smoking status: Never Smoker   . Smokeless tobacco: Never Used  Substance and Sexual Activity  . Alcohol use: No  . Drug use: No  . Sexual activity: Not on file    Comment: lives with sister and mother, no major dietary restrictions  Lifestyle  . Physical activity:    Days per week: Not on file    Minutes per session: Not on file  . Stress: Not on file  Relationships  . Social connections:    Talks on phone: Not on file    Gets together: Not on file    Attends religious service: Not on file    Active member of club or organization: Not on file    Attends meetings of clubs or organizations: Not on file    Relationship status: Not on file  . Intimate partner violence:    Fear of current or ex partner: Not on file    Emotionally abused: Not on file    Physically abused: Not on file    Forced sexual activity: Not on file  Other Topics Concern  . Not on file  Social History Narrative   Sister is primary caregiver    Outpatient Medications Prior to Visit  Medication Sig Dispense Refill  . baclofen (LIORESAL) 20 MG tablet Take 0.5-1 tablets (10-20 mg total) by mouth 4 (four) times daily as needed for muscle spasms. 90 each 3  . clobetasol (TEMOVATE) 0.05 % external solution Apply topically.    . docusate sodium (COLACE) 100 MG capsule Take 1 tablet by mouth daily.    Marland Kitchen FIBER PO Take 1 capsule by mouth daily.    . furosemide (LASIX) 20 MG tablet Take 1 tablet (20 mg total) by mouth 2 (two) times daily. 180 tablet 1  . methylcellulose (FIBER THERAPY) oral powder Take 1 capsule by mouth daily.    . Multiple Vitamins-Calcium (ONE-A-DAY WOMENS FORMULA PO) Take 1 tablet by mouth daily.      . mupirocin ointment (BACTROBAN) 2 % Place 1 application into the nose 2 (two) times daily. 22 g 1  . oxybutynin (DITROPAN) 5 MG tablet Take 5 mg by mouth every morning.      . potassium chloride SA (K-DUR,KLOR-CON) 20 MEQ tablet Take 2 tablets (40 mEq total) by mouth 2 (two) times daily. 360 tablet 1  . Probiotic Product  (DIGESTIVE ADVANTAGE) CAPS Take 1 tablet by mouth daily.    . rosuvastatin (CRESTOR) 5 MG tablet TAKE 1 TABLET BY MOUTH EVERY OTHER DAY 15 tablet 3  . FLUoxetine (PROZAC) 10 MG tablet  Take 2 tablets (20 mg total) by mouth daily. 60 tablet 1   No facility-administered medications prior to visit.     Allergies  Allergen Reactions  . Adhesive [Tape] Other (See Comments)    Skin Irritant. Does not tolerate [employer], tolerates [place]    Review of Systems  Constitutional: Negative for fever and malaise/fatigue.  HENT: Negative for congestion.   Eyes: Negative for blurred vision.  Respiratory: Negative for shortness of breath.   Cardiovascular: Negative for chest pain, palpitations and leg swelling.  Gastrointestinal: Negative for abdominal pain, blood in stool and nausea.  Genitourinary: Negative for dysuria and frequency.  Musculoskeletal: Negative for falls.  Skin: Negative for rash.  Neurological: Negative for dizziness, loss of consciousness and headaches.  Endo/Heme/Allergies: Negative for environmental allergies.  Psychiatric/Behavioral: Positive for depression. The patient is not nervous/anxious.        Objective:    Physical Exam  Constitutional: She is oriented to person, place, and time. She appears well-developed and well-nourished. No distress.  HENT:  Head: Normocephalic and atraumatic.  Nose: Nose normal.  Eyes: Right eye exhibits no discharge. Left eye exhibits no discharge.  Neck: Normal range of motion. Neck supple.  Cardiovascular: Normal rate and regular rhythm.  No murmur heard. Pulmonary/Chest: Effort normal and breath sounds normal.  Abdominal: Soft. Bowel sounds are normal. There is no tenderness.  Musculoskeletal: She exhibits no edema.  Neurological: She is alert and oriented to person, place, and time.  Skin: Skin is warm and dry.  Psychiatric: She has a normal mood and affect.  Nursing note and vitals reviewed.   BP 134/72 (BP Location: Left  Arm, Patient Position: Sitting, Cuff Size: Normal)   Pulse 61   Temp 98.1 F (36.7 C) (Oral)   Resp 18   Wt 225 lb (102.1 kg)   SpO2 98%   BMI 30.52 kg/m  Wt Readings from Last 3 Encounters:  02/19/18 225 lb (102.1 kg)  11/16/17 221 lb (100.2 kg)  05/06/17 225 lb (102.1 kg)   BP Readings from Last 3 Encounters:  02/19/18 134/72  11/16/17 (!) 142/78  08/07/17 (!) 149/84     Immunization History  Administered Date(s) Administered  . H1N1 07/24/2008  . Influenza Split 04/05/2011, 04/18/2012  . Influenza Whole 06/03/2008, 03/31/2009, 04/01/2010  . Influenza, High Dose Seasonal PF 02/19/2018  . Influenza,inj,Quad PF,6+ Mos 03/20/2013, 03/03/2014, 05/18/2015, 04/20/2016, 04/06/2017  . Pneumococcal Conjugate-13 06/24/2013  . Pneumococcal Polysaccharide-23 12/07/2005  . Pneumococcal-Unspecified 12/07/2005, 06/24/2013  . Td 06/03/2008  . Zoster 11/26/2012    Health Maintenance  Topic Date Due  . HIV Screening  10/30/1967  . DEXA SCAN  10/29/2017  . PNA vac Low Risk Adult (2 of 2 - PPSV23) 10/29/2017  . INFLUENZA VACCINE  01/11/2018  . MAMMOGRAM  04/18/2018  . Fecal DNA (Cologuard)  05/23/2018  . TETANUS/TDAP  06/03/2018  . PAP SMEAR  05/19/2019  . Hepatitis C Screening  Completed    Lab Results  Component Value Date   WBC 7.2 08/07/2017   HGB 13.9 08/07/2017   HCT 42.0 08/07/2017   PLT 224.0 08/07/2017   GLUCOSE 94 08/07/2017   CHOL 181 08/07/2017   TRIG 141.0 08/07/2017   HDL 38.50 (L) 08/07/2017   LDLDIRECT 105.0 02/06/2017   LDLCALC 115 (H) 08/07/2017   ALT 21 08/07/2017   AST 15 08/07/2017   NA 139 08/07/2017   K 4.7 08/07/2017   CL 103 08/07/2017   CREATININE 0.57 08/07/2017   BUN 8 08/07/2017  CO2 30 08/07/2017   TSH 2.28 08/07/2017   HGBA1C 5.9 08/07/2017    Lab Results  Component Value Date   TSH 2.28 08/07/2017   Lab Results  Component Value Date   WBC 7.2 08/07/2017   HGB 13.9 08/07/2017   HCT 42.0 08/07/2017   MCV 82.0 08/07/2017    PLT 224.0 08/07/2017   Lab Results  Component Value Date   NA 139 08/07/2017   K 4.7 08/07/2017   CO2 30 08/07/2017   GLUCOSE 94 08/07/2017   BUN 8 08/07/2017   CREATININE 0.57 08/07/2017   BILITOT 0.4 08/07/2017   ALKPHOS 86 08/07/2017   AST 15 08/07/2017   ALT 21 08/07/2017   PROT 7.3 08/07/2017   ALBUMIN 3.8 08/07/2017   CALCIUM 9.4 08/07/2017   GFR 113.22 08/07/2017   Lab Results  Component Value Date   CHOL 181 08/07/2017   Lab Results  Component Value Date   HDL 38.50 (L) 08/07/2017   Lab Results  Component Value Date   LDLCALC 115 (H) 08/07/2017   Lab Results  Component Value Date   TRIG 141.0 08/07/2017   Lab Results  Component Value Date   CHOLHDL 5 08/07/2017   Lab Results  Component Value Date   HGBA1C 5.9 08/07/2017         Assessment & Plan:   Problem List Items Addressed This Visit    Hyperlipidemia    Encouraged heart healthy diet, increase exercise, avoid trans fats, consider a krill oil cap daily      Relevant Orders   Lipid panel   Essential hypertension    Well controlled, no changes to meds. Encouraged heart healthy diet such as the DASH diet and exercise as tolerated.       Relevant Orders   TSH   CBC   Comprehensive metabolic panel   Hyperglycemia    hgba1c acceptable, minimize simple carbs. Increase exercise as tolerated.       Relevant Orders   Hemoglobin A1c   Depression    Managing with Fluoxetine 10 mg daily does not feel the 20 mg dose helped any more so she dropped back further.       Relevant Medications   FLUoxetine (PROZAC) 10 MG tablet   Right hip pain    Right leg pain has improved and is cuently not a concern.       Colostomy status ([place])    She is doing much better since having the colostomy put in place. Not having trouble with the adhesive or the bag       Other Visit Diagnoses    Needs flu shot    -  Primary   Relevant Orders   Flu vaccine HIGH DOSE PF (Fluzone High dose) (Completed)       I have changed [name]'s FLUoxetine. I am also having her maintain her Multiple Vitamins-Calcium (ONE-A-DAY WOMENS FORMULA PO), oxybutynin, clobetasol, DIGESTIVE ADVANTAGE, methylcellulose, docusate sodium, mupirocin ointment, FIBER PO, rosuvastatin, baclofen, furosemide, and potassium chloride SA.  Meds ordered this encounter  Medications  . FLUoxetine (PROZAC) 10 MG tablet    Sig: Take 1 tablet (10 mg total) by mouth daily.    Dispense:  30 tablet    Refill:  2    CMA served as scribe during this visit. History, Physical and Plan performed by medical provider. Documentation and orders reviewed and attested to.  Penni Homans, MD

## 2018-02-19 NOTE — Assessment & Plan Note (Signed)
She is doing much better since having the colostomy put in place. Not having trouble with the adhesive or the bag

## 2018-02-19 NOTE — Assessment & Plan Note (Signed)
Encouraged heart healthy diet, increase exercise, avoid trans fats, consider a krill oil cap daily 

## 2018-02-19 NOTE — Assessment & Plan Note (Signed)
Right leg pain has improved and is cuently not a concern.

## 2018-02-20 LAB — CBC
HCT: 41.6 % (ref 36.0–46.0)
Hemoglobin: 13.8 g/dL (ref 12.0–15.0)
MCHC: 33.3 g/dL (ref 30.0–36.0)
MCV: 80.8 fl (ref 78.0–100.0)
Platelets: 226 10*3/uL (ref 150.0–400.0)
RBC: 5.14 Mil/uL — ABNORMAL HIGH (ref 3.87–5.11)
RDW: 15.4 % (ref 11.5–15.5)
WBC: 7 10*3/uL (ref 4.0–10.5)

## 2018-02-20 LAB — COMPREHENSIVE METABOLIC PANEL
ALT: 19 U/L (ref 0–35)
AST: 13 U/L (ref 0–37)
Albumin: 4 g/dL (ref 3.5–5.2)
Alkaline Phosphatase: 85 U/L (ref 39–117)
BUN: 8 mg/dL (ref 6–23)
CO2: 30 mEq/L (ref 19–32)
Calcium: 9.3 mg/dL (ref 8.4–10.5)
Chloride: 102 mEq/L (ref 96–112)
Creatinine, Ser: 0.49 mg/dL (ref 0.40–1.20)
GFR: 134.58 mL/min (ref >60.00)
Glucose, Bld: 87 mg/dL (ref 70–99)
Potassium: 4.2 mEq/L (ref 3.5–5.1)
Sodium: 139 mEq/L (ref 135–145)
Total Bilirubin: 0.4 mg/dL (ref 0.2–1.2)
Total Protein: 7.1 g/dL (ref 6.0–8.3)

## 2018-02-20 LAB — LIPID PANEL
Cholesterol: 185 mg/dL (ref 0–200)
HDL: 39.5 mg/dL (ref >39.00)
LDL Cholesterol: 110 mg/dL — ABNORMAL HIGH (ref 0–99)
NonHDL: 145.04
Total CHOL/HDL Ratio: 5
Triglycerides: 175 mg/dL — ABNORMAL HIGH (ref 0.0–149.0)
VLDL: 35 mg/dL (ref 0.0–40.0)

## 2018-02-20 LAB — HEMOGLOBIN A1C: Hgb A1c MFr Bld: 5.9 % (ref 4.6–6.5)

## 2018-02-20 LAB — TSH: TSH: 2.5 u[IU]/mL (ref 0.35–4.50)

## 2018-03-06 ENCOUNTER — Other Ambulatory Visit: Payer: Self-pay | Admitting: Family Medicine

## 2018-05-31 ENCOUNTER — Other Ambulatory Visit: Payer: Self-pay | Admitting: Urology

## 2018-06-07 ENCOUNTER — Encounter (HOSPITAL_BASED_OUTPATIENT_CLINIC_OR_DEPARTMENT_OTHER): Payer: Self-pay

## 2018-06-08 ENCOUNTER — Encounter (HOSPITAL_BASED_OUTPATIENT_CLINIC_OR_DEPARTMENT_OTHER): Payer: Self-pay

## 2018-06-08 ENCOUNTER — Other Ambulatory Visit: Payer: Self-pay

## 2018-06-20 NOTE — H&P (Signed)
CC/HPI: Neurogenic bladder.   [name] returns today for annual f/u. She is doing well. She denies flank pain or hematuria. She has a NGB with a chronic foley and chronic cystitis with a history of bladder stones. She remains on oxybutynin. She keeps cipro on hand for breakthrough infections. She changes her catheter about 1x weekly. She has a history of a left renal stone. She has no associated signs or symptoms. There are no changes     ALLERGIES: Adhesive Bandages MISC    MEDICATIONS: Aleve 220 mg capsule  Baclofen 20 mg tablet  Clobetasol Propionate 0.05 % lotion  Colace  Fiber  Fluoxetine Hcl  Furosemide 20 mg tablet  Imiquimod 5 % cream in packet  Metronidazole 0.75 % cream  Multivitamin  Potassium Chloride 20 meq tablet, extended release  Probiotic     GU PSH: Cystolithotomy - 2009 Cystoscopy - 05/18/2017, 2017 Rpr Umbil Hern; Reduc < 5 Yr - 2012      PSH Notes: Umbilical Hernia Repair, Rotator Cuff Repair, Shoulder Surgery, Rotator Cuff Repair, Foot Repair, Hand Surgery, Bladder Cystotomy With Direct Removal Of Calculus, Shoulder Arthroplasty Total Shoulder Replacement, Lumbar Vertebral Fusion, Arthroscopy Shoulder Right   NON-GU PSH: Arthroscopic Shoulder Surgery - 2009 Colostomy Lumbar Spine Fusion - 2009 Reconstruct Shoulder Joint - 2009    GU PMH: Areflexic bladder - 2017 Chronic cystitis (w/o hematuria), Chronic cystitis - 2016 Hydronephrosis Unspec, Hydronephrosis, right - 2016 Renal calculus, Nephrolithiasis - 2016 Bladder Stone, Bladder calculus - 2015 Excessive and frequent menstruation with regular cycle, Menorrhagia - 2014      PMH Notes:  2012-03-21 12:20:15 - Note: Bladder Calculus  2008-02-05 10:30:28 - Note: Arthritis  2013-03-27 11:34:12 - Note: Anal Polyps   NON-GU PMH: Encounter for general adult medical examination without abnormal findings, Encounter for preventive health examination - 2016 Other specified diseases of spinal cord, Cord bladder  - 2016 Personal history of diseases of the blood and blood-forming organs and certain disorders involving the immune mechanism, History of anemia - 2016 Personal history of other diseases of the circulatory system, History of hypertension - 2016, History of mitral valve prolapse, - 2014 Personal history of other diseases of the nervous system and sense organs, History of quadriplegia - 2016 Personal history of other medical treatment, Foley catheter in place - 2016 Candidiasis of skin and nail, Cutaneous candidiasis - 2014 Personal history of other endocrine, nutritional and metabolic disease, History of hypercholesterolemia - 0938 Umbilical hernia without obstruction or gangrene, Umbilical Hernia - 1829    FAMILY HISTORY: Acute Myocardial Infarction - Father Breast Cancer - Mother Death In The Family Father - Father Family Health Status - Mother's Age - Mother skin cancer - Mother, Sister Transient Ischemic Attack - Mother   SOCIAL HISTORY: Marital Status: Single Preferred Language: English; Race: White Current Smoking Status: Patient has never smoked.   Tobacco Use Assessment Completed: Used Tobacco in last 30 days? Drinks 1 caffeinated drink per day.     Notes: Never a smoker, Retired From Work, Caffeine Use, Tobacco Use, Marital History - Single, Alcohol Use   REVIEW OF SYSTEMS:    GU Review Female:   Patient denies frequent urination, hard to postpone urination, burning /pain with urination, get up at night to urinate, leakage of urine, stream starts and stops, trouble starting your stream, have to strain to urinate, and being pregnant.  Gastrointestinal (Upper):   Patient denies nausea, vomiting, and indigestion/ heartburn.  Gastrointestinal (Lower):   Patient denies diarrhea and constipation.  Constitutional:  Patient denies fever, night sweats, weight loss, and fatigue.  Skin:   Patient denies skin rash/ lesion and itching.  Eyes:   Patient denies blurred vision and double  vision.  Ears/ Nose/ Throat:   Patient denies sore throat and sinus problems.  Hematologic/Lymphatic:   Patient denies swollen glands and easy bruising.  Cardiovascular:   Patient denies leg swelling and chest pains.  Respiratory:   Patient denies cough and shortness of breath.  Endocrine:   Patient denies excessive thirst.  Musculoskeletal:   Patient denies back pain and joint pain.  Neurological:   Patient denies headaches and dizziness.  Psychologic:   Patient denies depression and anxiety.   VITAL SIGNS:      05/30/2018 11:11 AM  BP 171/87 mmHg  Pulse 58 /min   MULTI-SYSTEM PHYSICAL EXAMINATION:    Constitutional: Obese. No physical deformities. Normally developed. Good grooming.   Respiratory: Normal breath sounds. No labored breathing, no use of accessory muscles.   Cardiovascular: Abnormal heart rhythm normal rate but irregular.   Neurologic / Psychiatric: Oriented to time, oriented to place, oriented to person. No depression, no anxiety, no agitation. Quadriplegia     PAST DATA REVIEWED:  Source Of History:  Patient  Urine Test Review:   Urinalysis  X-Ray Review: Renal Ultrasound: Reviewed Films. Discussed With Patient.     PROCEDURES:         Renal Ultrasound - 81448  Right Kidney: Length:13.9 cm Depth: 5.8 cm Cortical Width: 1.3 cm Width: 5.7 cm  Left Kidney: Length: 12.1 cm Depth: 6.0 cm Cortical Width: 1.5 cm Width: 5.2 cm  Left Kidney/Ureter:  Multiple echogenic foci with the largest measuring 0.6 cm in the lower pole ( calcifications vs vessels).   Right Kidney/Ureter:  0.9 cm calcification in the mid kidney.   Bladder:  PVR = not distinctly visualized.       This exam is limited due to bowel gas, rib shadowing, the patient was scanned in her chair, etc. Some measurements are estimated.          Urinalysis w/Scope Dipstick Dipstick Cont'd Micro  Color: Yellow Bilirubin: Neg mg/dL WBC/hpf: 0 - 5/hpf  Appearance: Clear Ketones: Neg mg/dL RBC/hpf: 0 - 2/hpf   Specific Gravity: 1.010 Blood: Neg ery/uL Bacteria: Few (10-25/hpf)  pH: 8.0 Protein: Neg mg/dL Cystals: NS (Not Seen)  Glucose: Neg mg/dL Urobilinogen: 0.2 mg/dL Casts: NS (Not Seen)    Nitrites: Neg Trichomonas: Not Present    Leukocyte Esterase: 2+ leu/uL Mucous: Not Present      Epithelial Cells: NS (Not Seen)      Yeast: NS (Not Seen)      Sperm: Not Present         Notes:   A PAP smear was performed with the endocervical paddle.    ASSESSMENT:      ICD-10 Details  1 GU:   Areflexic bladder - N31.2 there is some bladder wall erythema and edema on cystoscopy and increased bladder stones. I am going to get her set up for a cystoscopy with biopsy and stone removal. Risks reviewed   2   Bladder Stone - N21.0 Worsening - Multple eggshell calcifications.   3   Chronic cystitis (w/o hematuria) - N30.20 Stable - Urine culture today.  4   Renal calculus - N20.0 Stable - Stable without obstruction.   PLAN:            Medications New Meds: Oxybutynin Chloride 5 mg tablet 1 tablet PO TID   #  270  3 Refill(s)            Orders Labs Urine Culture          Schedule Return Visit/Planned Activity: Next Available Appointment - Schedule Surgery          Document Letter(s):  Created for Patient: Clinical Summary

## 2018-06-21 ENCOUNTER — Ambulatory Visit (HOSPITAL_BASED_OUTPATIENT_CLINIC_OR_DEPARTMENT_OTHER): Payer: Medicare Other | Admitting: Anesthesiology

## 2018-06-21 ENCOUNTER — Encounter (HOSPITAL_COMMUNITY)
Admission: RE | Disposition: A | Discharge status: Home / Self Care | Payer: Self-pay | Source: Home / Self Care | Attending: Urology

## 2018-06-21 ENCOUNTER — Encounter (HOSPITAL_COMMUNITY): Payer: Self-pay | Admitting: *Deleted

## 2018-06-21 ENCOUNTER — Ambulatory Visit (HOSPITAL_BASED_OUTPATIENT_CLINIC_OR_DEPARTMENT_OTHER)
Admission: RE | Admit: 2018-06-21 | Discharge: 2018-06-21 | Disposition: A | Discharge status: Home / Self Care | Payer: Medicare Other | Attending: Urology | Admitting: Urology

## 2018-06-21 ENCOUNTER — Other Ambulatory Visit: Payer: Self-pay

## 2018-06-21 DIAGNOSIS — Z803 Family history of malignant neoplasm of breast: Secondary | ICD-10-CM | POA: Diagnosis not present

## 2018-06-21 DIAGNOSIS — I341 Nonrheumatic mitral (valve) prolapse: Secondary | ICD-10-CM | POA: Insufficient documentation

## 2018-06-21 DIAGNOSIS — N210 Calculus in bladder: Secondary | ICD-10-CM | POA: Insufficient documentation

## 2018-06-21 DIAGNOSIS — Z823 Family history of stroke: Secondary | ICD-10-CM | POA: Diagnosis not present

## 2018-06-21 DIAGNOSIS — Z79899 Other long term (current) drug therapy: Secondary | ICD-10-CM | POA: Diagnosis not present

## 2018-06-21 DIAGNOSIS — G8250 Quadriplegia, unspecified: Secondary | ICD-10-CM | POA: Diagnosis not present

## 2018-06-21 DIAGNOSIS — Z87442 Personal history of urinary calculi: Secondary | ICD-10-CM | POA: Insufficient documentation

## 2018-06-21 DIAGNOSIS — Z808 Family history of malignant neoplasm of other organs or systems: Secondary | ICD-10-CM | POA: Insufficient documentation

## 2018-06-21 DIAGNOSIS — N3289 Other specified disorders of bladder: Secondary | ICD-10-CM | POA: Insufficient documentation

## 2018-06-21 DIAGNOSIS — E7800 Pure hypercholesterolemia, unspecified: Secondary | ICD-10-CM | POA: Insufficient documentation

## 2018-06-21 DIAGNOSIS — Z96619 Presence of unspecified artificial shoulder joint: Secondary | ICD-10-CM | POA: Insufficient documentation

## 2018-06-21 HISTORY — DX: Unspecified osteoarthritis, unspecified site: M19.90

## 2018-06-21 HISTORY — DX: Autonomic dysreflexia: G90.4

## 2018-06-21 HISTORY — DX: Other complications of anesthesia, initial encounter: T88.59XA

## 2018-06-21 HISTORY — DX: Nonspecific low blood-pressure reading: R03.1

## 2018-06-21 HISTORY — DX: Obstructive sleep apnea (adult) (pediatric): G47.33

## 2018-06-21 HISTORY — DX: Dependence on other enabling machines and devices: Z99.89

## 2018-06-21 HISTORY — PX: CYSTOSCOPY WITH LITHOLAPAXY: SHX1425

## 2018-06-21 HISTORY — DX: Presence of spectacles and contact lenses: Z97.3

## 2018-06-21 HISTORY — DX: Anemia, unspecified: D64.9

## 2018-06-21 HISTORY — DX: Personal history of other diseases of the musculoskeletal system and connective tissue: Z87.39

## 2018-06-21 HISTORY — DX: Personal history of other diseases of the digestive system: Z87.19

## 2018-06-21 HISTORY — DX: Adverse effect of unspecified anesthetic, initial encounter: T41.45XA

## 2018-06-21 HISTORY — DX: Nonrheumatic mitral (valve) prolapse: I34.1

## 2018-06-21 HISTORY — PX: CYSTOSCOPY WITH BIOPSY: SHX5122

## 2018-06-21 HISTORY — DX: Other specified congenital malformations of circulatory system: Q28.8

## 2018-06-21 LAB — BASIC METABOLIC PANEL WITH GFR
Anion gap: 6 (ref 5–15)
BUN: 10 mg/dL (ref 8–23)
CO2: 26 mmol/L (ref 22–32)
Calcium: 8.6 mg/dL — ABNORMAL LOW (ref 8.9–10.3)
Chloride: 106 mmol/L (ref 98–111)
Creatinine, Ser: 0.49 mg/dL (ref 0.44–1.00)
GFR calc Af Amer: >60 mL/min
GFR calc non Af Amer: >60 mL/min
Glucose, Bld: 105 mg/dL — ABNORMAL HIGH (ref 70–99)
Potassium: 3.9 mmol/L (ref 3.5–5.1)
Sodium: 138 mmol/L (ref 135–145)

## 2018-06-21 LAB — CBC
HCT: 42.9 % (ref 36.0–46.0)
Hemoglobin: 13.6 g/dL (ref 12.0–15.0)
MCH: 27.1 pg (ref 26.0–34.0)
MCHC: 31.7 g/dL (ref 30.0–36.0)
MCV: 85.6 fL (ref 80.0–100.0)
Platelets: 198 10*3/uL (ref 150–400)
RBC: 5.01 MIL/uL (ref 3.87–5.11)
RDW: 14.4 % (ref 11.5–15.5)
WBC: 6.4 10*3/uL (ref 4.0–10.5)
nRBC: 0 % (ref 0.0–0.2)

## 2018-06-21 SURGERY — CYSTOSCOPY, WITH BLADDER CALCULUS LITHOLAPAXY
Anesthesia: Monitor Anesthesia Care

## 2018-06-21 MED ORDER — LIDOCAINE 2% (20 MG/ML) 5 ML SYRINGE
INTRAMUSCULAR | Status: AC
  Filled 2018-06-21: qty 5

## 2018-06-21 MED ORDER — SODIUM CHLORIDE 0.9% FLUSH: 3.0000 mL | Freq: Two times a day (BID) | INTRAVENOUS | Status: DC

## 2018-06-21 MED ORDER — ACETAMINOPHEN 650 MG RE SUPP
650.0000 mg | RECTAL | Status: DC | PRN
  Filled 2018-06-21: qty 1

## 2018-06-21 MED ORDER — BUPIVACAINE HCL (PF) 0.5 % IJ SOLN
INTRAMUSCULAR | Status: AC
  Filled 2018-06-21: qty 30

## 2018-06-21 MED ORDER — HYDRALAZINE HCL 20 MG/ML IJ SOLN
INTRAMUSCULAR | Status: AC
  Filled 2018-06-21: qty 1

## 2018-06-21 MED ORDER — ACETAMINOPHEN 325 MG PO TABS: 650.0000 mg | ORAL_TABLET | ORAL | Status: DC | PRN

## 2018-06-21 MED ORDER — SODIUM CHLORIDE 0.9% FLUSH: 3.0000 mL | INTRAVENOUS | Status: DC | PRN

## 2018-06-21 MED ORDER — PROPOFOL 10 MG/ML IV BOLUS
INTRAVENOUS | Status: DC | PRN
  Administered 2018-06-21: 50 mg via INTRAVENOUS
  Administered 2018-06-21: 40 mg via INTRAVENOUS

## 2018-06-21 MED ORDER — FENTANYL CITRATE (PF) 100 MCG/2ML IJ SOLN: 25.0000 ug | INTRAMUSCULAR | Status: DC | PRN

## 2018-06-21 MED ORDER — MORPHINE SULFATE (PF) 4 MG/ML IV SOLN: 1.0000 mg | INTRAVENOUS | Status: DC | PRN

## 2018-06-21 MED ORDER — BELLADONNA ALKALOIDS-OPIUM 16.2-30 MG RE SUPP
RECTAL | Status: AC
  Filled 2018-06-21: qty 1

## 2018-06-21 MED ORDER — MIDAZOLAM HCL 5 MG/5ML IJ SOLN
INTRAMUSCULAR | Status: DC | PRN
  Administered 2018-06-21: 2 mg via INTRAVENOUS

## 2018-06-21 MED ORDER — GENTAMICIN SULFATE 40 MG/ML IJ SOLN
5.0000 mg/kg | INTRAVENOUS | Status: AC
  Administered 2018-06-21: 424.4 mg via INTRAVENOUS
  Filled 2018-06-21: qty 10.5

## 2018-06-21 MED ORDER — INDIGOTINDISULFONATE SODIUM 8 MG/ML IJ SOLN
INTRAMUSCULAR | Status: AC
  Filled 2018-06-21: qty 5

## 2018-06-21 MED ORDER — PROPOFOL 500 MG/50ML IV EMUL
INTRAVENOUS | Status: DC | PRN
  Administered 2018-06-21: 50 ug/kg/min via INTRAVENOUS

## 2018-06-21 MED ORDER — MIDAZOLAM HCL 2 MG/2ML IJ SOLN
INTRAMUSCULAR | Status: AC
  Filled 2018-06-21: qty 2

## 2018-06-21 MED ORDER — CEFAZOLIN SODIUM-DEXTROSE 2-4 GM/100ML-% IV SOLN
2.0000 g | INTRAVENOUS | Status: AC
  Administered 2018-06-21: 2 g via INTRAVENOUS
  Filled 2018-06-21: qty 100

## 2018-06-21 MED ORDER — SODIUM CHLORIDE 0.9 % IV SOLN: 250.0000 mL | INTRAVENOUS | Status: DC | PRN

## 2018-06-21 MED ORDER — PROPOFOL 10 MG/ML IV BOLUS
INTRAVENOUS | Status: AC
  Filled 2018-06-21: qty 20

## 2018-06-21 MED ORDER — PROMETHAZINE HCL 25 MG/ML IJ SOLN: 6.2500 mg | INTRAMUSCULAR | Status: DC | PRN

## 2018-06-21 MED ORDER — LIDOCAINE 2% (20 MG/ML) 5 ML SYRINGE
INTRAMUSCULAR | Status: DC | PRN
  Administered 2018-06-21: 40 mg via INTRAVENOUS

## 2018-06-21 MED ORDER — LACTATED RINGERS IV SOLN
INTRAVENOUS | Status: DC
  Administered 2018-06-21: 12:00:00 via INTRAVENOUS

## 2018-06-21 MED ORDER — OXYCODONE HCL 5 MG PO TABS: 5.0000 mg | ORAL_TABLET | ORAL | Status: DC | PRN

## 2018-06-21 MED ORDER — HYDRALAZINE HCL 20 MG/ML IJ SOLN
INTRAMUSCULAR | Status: DC | PRN
  Administered 2018-06-21: 5 mg via INTRAVENOUS

## 2018-06-21 MED ORDER — LIDOCAINE 2% (20 MG/ML) 5 ML SYRINGE: INTRAMUSCULAR | Status: DC | PRN

## 2018-06-21 MED ORDER — FENTANYL CITRATE (PF) 100 MCG/2ML IJ SOLN
INTRAMUSCULAR | Status: AC
  Filled 2018-06-21: qty 2

## 2018-06-21 MED ORDER — LIDOCAINE HCL URETHRAL/MUCOSAL 2 % EX GEL
CUTANEOUS | Status: AC
  Filled 2018-06-21: qty 5

## 2018-06-21 SURGICAL SUPPLY — 32 items
BAG DRAIN URO-CYSTO SKYTR STRL (DRAIN) ×3 IMPLANT
CATH FOLEY 2WAY SLVR  5CC 16FR (CATHETERS)
CATH FOLEY 2WAY SLVR 5CC 16FR (CATHETERS) IMPLANT
CATH URET 5FR 28IN CONE TIP (BALLOONS)
CATH URET 5FR 28IN OPEN ENDED (CATHETERS) IMPLANT
CATH URET 5FR 70CM CONE TIP (BALLOONS) IMPLANT
CLOTH BEACON ORANGE TIMEOUT ST (SAFETY) ×4 IMPLANT
ELECT REM PT RETURN 9FT ADLT (ELECTROSURGICAL) ×3
ELECTRODE REM PT RTRN 9FT ADLT (ELECTROSURGICAL) ×1 IMPLANT
FIBER LASER FLEXIVA 1000 (UROLOGICAL SUPPLIES) IMPLANT
FIBER LASER FLEXIVA 365 (UROLOGICAL SUPPLIES) IMPLANT
FIBER LASER FLEXIVA 550 (UROLOGICAL SUPPLIES) IMPLANT
FIBER LASER TRAC TIP (UROLOGICAL SUPPLIES) IMPLANT
GLOVE SURG SS PI 8.0 STRL IVOR (GLOVE) ×3 IMPLANT
GOWN STRL REUS W/ TWL LRG LVL3 (GOWN DISPOSABLE) ×1 IMPLANT
GOWN STRL REUS W/ TWL XL LVL3 (GOWN DISPOSABLE) ×1 IMPLANT
GOWN STRL REUS W/TWL LRG LVL3 (GOWN DISPOSABLE) ×6
GOWN STRL REUS W/TWL XL LVL3 (GOWN DISPOSABLE) ×5 IMPLANT
GUIDEWIRE ANG ZIPWIRE 038X150 (WIRE) IMPLANT
GUIDEWIRE STR DUAL SENSOR (WIRE) IMPLANT
KIT TURNOVER CYSTO (KITS) ×3 IMPLANT
MANIFOLD NEPTUNE II (INSTRUMENTS) ×2 IMPLANT
NDL SAFETY ECLIPSE 18X1.5 (NEEDLE) IMPLANT
NEEDLE HYPO 18GX1.5 SHARP (NEEDLE)
NEEDLE HYPO 22GX1.5 SAFETY (NEEDLE) IMPLANT
NS IRRIG 500ML POUR BTL (IV SOLUTION) IMPLANT
PACK CYSTO (CUSTOM PROCEDURE TRAY) ×3 IMPLANT
SYR 20CC LL (SYRINGE) IMPLANT
TUBE CONNECTING 12'X1/4 (SUCTIONS)
TUBE CONNECTING 12X1/4 (SUCTIONS) IMPLANT
WATER STERILE IRR 3000ML UROMA (IV SOLUTION) ×3 IMPLANT
WATER STERILE IRR 500ML POUR (IV SOLUTION) IMPLANT

## 2018-06-21 NOTE — Discharge Instructions (Addendum)
°  Post Anesthesia Home Care Instructions  Activity: Get plenty of rest for the remainder of the day. A responsible individual must stay with you for 24 hours following the procedure.  For the next 24 hours, DO NOT: -Drive a car -Paediatric nurse -Drink alcoholic beverages -Take any medication unless instructed by your physician -Make any legal decisions or sign important papers.  Meals: Start with liquid foods such as gelatin or soup. Progress to regular foods as tolerated. Avoid greasy, spicy, heavy foods. If nausea and/or vomiting occur, drink only clear liquids until the nausea and/or vomiting subsides. Call your physician if vomiting continues.  Special Instructions/Symptoms: Your throat may feel dry or sore from the anesthesia or the breathing tube placed in your throat during surgery. If this causes discomfort, gargle with warm salt water. The discomfort should disappear within 24 hours.  If you had a scopolamine patch placed behind your ear for the management of post- operative nausea and/or vomiting:  1. The medication in the patch is effective for 72 hours, after which it should be removed.  Wrap patch in a tissue and discard in the trash. Wash hands thoroughly with soap and water. 2. You may remove the patch earlier than 72 hours if you experience unpleasant side effects which may include dry mouth, dizziness or visual disturbances. 3. Avoid touching the patch. Wash your hands with soap and water after contact with the patch.    CYSTOSCOPY HOME CARE INSTRUCTIONS  Activity: Rest for the remainder of the day.  Do not drive or operate equipment today.  You may resume normal activities in one to two days as instructed by your physician.   Meals: Drink plenty of liquids and eat light foods such as gelatin or soup this evening.  You may return to a normal meal plan tomorrow.  Return to Work: You may return to work in one to two days or as instructed by your physician.  Special  Instructions / Symptoms: Call your physician if any of these symptoms occur:   -persistent or heavy bleeding  -bleeding which continues after first few urination  -large blood clots that are difficult to pass  -urine stream diminishes or stops completely  -fever equal to or higher than 101 degrees Farenheit.  -cloudy urine with a strong, foul odor  -severe pain  Females should always wipe from front to back after elimination.  You may feel some burning pain when you urinate.  This should disappear with time.  Applying moist heat to the lower abdomen or a hot tub bath may help relieve the pain. \    Patient Signature:  ________________________________________________________  Nurse's Signature:  ________________________________________________________

## 2018-06-21 NOTE — Anesthesia Procedure Notes (Signed)
Procedure Name: MAC Date/Time: 06/21/2018 12:30 PM Performed by: Bonney Aid, CRNA Pre-anesthesia Checklist: Patient identified, Emergency Drugs available, Suction available, Patient being monitored and Timeout performed Patient Re-evaluated:Patient Re-evaluated prior to induction Oxygen Delivery Method: Simple face mask Placement Confirmation: positive ETCO2

## 2018-06-21 NOTE — Op Note (Signed)
Procedure: Cystoscopy with removal of small bladder stone.  Bladder biopsy and fulguration.  Preop diagnosis: Small bladder stone and bladder wall lesion.  Postop diagnosis: Same.  Surgeon: Dr. Irine Seal.  Anesthesia: MAC.  Specimen: Bladder biopsy.  Drain: 47 [place] Foley catheter.  EBL: Minimal.  Complications: None.  Indications: [name] is a 68 year old quadriplegic who was managed with chronic Foley catheter drainage.  She has annual cystoscopy and on her recent study she was found to have some small bladder stones that were felt to be too large to remove in the office and she was also noted to have some erythema on the posterior wall which is chronic but there was some keratinization of the overlying mucosa and it was felt that biopsy was indicated.  Procedure: She was given Ancef and gentamicin.Marland Kitchen  She was fitted with PAS hose.  She was placed on the table in the lithotomy position with great care given to her hips because of fixation and contractions.  Sedation was given as needed.  Her perineum and genitalia were prepped with Betadine solution and she was draped in usual sterile fashion.  Cystoscopy was performed using a 23 [place] scope and the 30 degree lens.  Examination revealed a normal urethra.  There was a small eggshell calcification in the bladder that was flushed out through the scope and no other residual calcifications were identified.  The larger stones that had noted at office cystoscopy appeared to have either been smaller than anticipated or come out alongside the catheter prior to the procedure.  Inspection of the bladder revealed erythema and edema on the posterior wall consistent with chronic Foley irritation but there was some keratinization of the mucosa.  No distinct papillary lesions were identified.  Ureteral orifice ease were unremarkable.  After thorough inspection 2 biopsies were obtained from the posterior wall in the area of the keratinized mucosa.  The  biopsy sites were fulgurated with a Bugbee electrode.  The cystoscope was removed and a fresh 20 French Foley catheter was inserted.  The balloon was filled with 10 mL of sterile fluid and the catheter was placed the leg bag drainage.  She was taken down from lithotomy position and moved recovery in stable condition.  There were no complications.

## 2018-06-21 NOTE — Anesthesia Preprocedure Evaluation (Signed)
Anesthesia Evaluation  Patient identified by MRN, date of birth, ID band Patient awake    Reviewed: Allergy & Precautions, NPO status , Patient's Chart, lab work & pertinent test results  Airway Mallampati: II  TM Distance: >3 FB Neck ROM: Limited    Dental no notable dental hx.    Pulmonary sleep apnea and Continuous Positive Airway Pressure Ventilation ,    Pulmonary exam normal breath sounds clear to auscultation       Cardiovascular hypertension, Normal cardiovascular exam Rhythm:Regular Rate:Normal     Neuro/Psych C5/6 quad negative neurological ROS  negative psych ROS   GI/Hepatic negative GI ROS, Neg liver ROS,   Endo/Other  negative endocrine ROS  Renal/GU negative Renal ROS  negative genitourinary   Musculoskeletal negative musculoskeletal ROS (+)   Abdominal   Peds negative pediatric ROS (+)  Hematology negative hematology ROS (+)   Anesthesia Other Findings   Reproductive/Obstetrics negative OB ROS                             Anesthesia Physical Anesthesia Plan  ASA: IV  Anesthesia Plan: MAC   Post-op Pain Management:    Induction: Intravenous  PONV Risk Score and Plan: 1 and Ondansetron  Airway Management Planned: Simple Face Mask  Additional Equipment:   Intra-op Plan:   Post-operative Plan:   Informed Consent: I have reviewed the patients History and Physical, chart, labs and discussed the procedure including the risks, benefits and alternatives for the proposed anesthesia with the patient or authorized representative who has indicated his/her understanding and acceptance.   Dental advisory given  Plan Discussed with: CRNA and Surgeon  Anesthesia Plan Comments:         Anesthesia Quick Evaluation

## 2018-06-21 NOTE — Transfer of Care (Signed)
Immediate Anesthesia Transfer of Care Note  Patient: [name]  Procedure(s) Performed: CYSTOSCOPY WITH LITHOLAPAXY (N/A ) CYSTOSCOPY WITH BIOPSY FULGURATION (N/A )  Patient Location: PACU  Anesthesia Type:MAC  Level of Consciousness: awake, alert  and oriented  Airway & Oxygen Therapy: Patient Spontanous Breathing and Patient connected to face mask oxygen  Post-op Assessment: Report given to RN and Post -op Vital signs reviewed and stable  Post vital signs: Reviewed and stable  Last Vitals:  Vitals Value Taken Time  BP 162/81 06/21/2018  1:02 PM  Temp    Pulse 91 06/21/2018  1:04 PM  Resp 13 06/21/2018  1:04 PM  SpO2 100 % 06/21/2018  1:04 PM  Vitals shown include unvalidated device data.  Last Pain:  Vitals:   06/21/18 1149  TempSrc: Oral  PainSc: 0-No pain         Complications: No apparent anesthesia complications

## 2018-06-21 NOTE — Anesthesia Postprocedure Evaluation (Signed)
Anesthesia Post Note  Patient: Areatha Keas  Procedure(s) Performed: CYSTOSCOPY WITH LITHOLAPAXY (N/A ) CYSTOSCOPY WITH BIOPSY FULGURATION (N/A )     Patient location during evaluation: PACU Anesthesia Type: MAC Level of consciousness: awake and alert Pain management: pain level controlled Vital Signs Assessment: post-procedure vital signs reviewed and stable Respiratory status: spontaneous breathing, nonlabored ventilation, respiratory function stable and patient connected to nasal cannula oxygen Cardiovascular status: stable and blood pressure returned to baseline Postop Assessment: no apparent nausea or vomiting Anesthetic complications: no    Last Vitals:  Vitals:   06/21/18 1413 06/21/18 1430  BP: (!) 156/83 (!) 144/80  Pulse: 73 78  Resp: 18   Temp: 36.5 C   SpO2: 100%     Last Pain:  Vitals:   06/21/18 1413  TempSrc:   PainSc: 0-No pain                 Seira Cody S

## 2018-06-21 NOTE — Interval H&P Note (Signed)
History and Physical Interval Note:  06/21/2018 12:12 PM  [name]  has presented today for surgery, with the diagnosis of BLADDER STONE BLADDER LESION  The various methods of treatment have been discussed with the patient and family. After consideration of risks, benefits and other options for treatment, the patient has consented to  Procedure(s): CYSTOSCOPY WITH LITHOLAPAXY (N/A) CYSTOSCOPY WITH BIOPSY FULGURATION (N/A) as a surgical intervention .  The patient's history has been reviewed, patient examined, no change in status, stable for surgery.  I have reviewed the patient's chart and labs.  Questions were answered to the patient's satisfaction.     Irine Seal

## 2018-06-22 ENCOUNTER — Encounter (HOSPITAL_BASED_OUTPATIENT_CLINIC_OR_DEPARTMENT_OTHER): Payer: Self-pay | Admitting: Urology

## 2018-06-25 ENCOUNTER — Ambulatory Visit (INDEPENDENT_AMBULATORY_CARE_PROVIDER_SITE_OTHER): Payer: Medicare Other | Admitting: Family Medicine

## 2018-06-25 VITALS — BP 165/66 | HR 61 | Temp 98.2°F | Resp 18 | Wt 230.0 lb

## 2018-06-25 DIAGNOSIS — E782 Mixed hyperlipidemia: Secondary | ICD-10-CM

## 2018-06-25 DIAGNOSIS — I10 Essential (primary) hypertension: Secondary | ICD-10-CM | POA: Diagnosis not present

## 2018-06-25 DIAGNOSIS — N200 Calculus of kidney: Secondary | ICD-10-CM

## 2018-06-25 DIAGNOSIS — R739 Hyperglycemia, unspecified: Secondary | ICD-10-CM

## 2018-06-25 DIAGNOSIS — F32A Depression, unspecified: Secondary | ICD-10-CM

## 2018-06-25 DIAGNOSIS — F329 Major depressive disorder, single episode, unspecified: Secondary | ICD-10-CM

## 2018-06-25 DIAGNOSIS — G8254 Quadriplegia, C5-C7 incomplete: Secondary | ICD-10-CM

## 2018-06-25 NOTE — Patient Instructions (Addendum)
Shingrex is the new shingles vaccine; it is 2 shots over 2-6 months. You can get it at the pharmacy.  Send/bring [place] copies of your advanced directives including healthcare power of attorney and living will  Hypertension Hypertension, commonly called high blood pressure, is when the force of blood pumping through the arteries is too strong. The arteries are the blood vessels that carry blood from the heart throughout the body. Hypertension forces the heart to work harder to pump blood and may cause arteries to become narrow or stiff. Having untreated or uncontrolled hypertension can cause heart attacks, strokes, kidney disease, and other problems. A blood pressure reading consists of a higher number over a lower number. Ideally, your blood pressure should be below 120/80. The first ("top") number is called the systolic pressure. It is a measure of the pressure in your arteries as your heart beats. The second ("bottom") number is called the diastolic pressure. It is a measure of the pressure in your arteries as the heart relaxes. What are the causes? The cause of this condition is not known. What increases the risk? Some risk factors for high blood pressure are under your control. Others are not. Factors you can change  Smoking.  Having type 2 diabetes mellitus, high cholesterol, or both.  Not getting enough exercise or physical activity.  Being overweight.  Having too much fat, sugar, calories, or salt (sodium) in your diet.  Drinking too much alcohol. Factors that are difficult or impossible to change  Having chronic kidney disease.  Having a family history of high blood pressure.  Age. Risk increases with age.  Race. You may be at higher risk if you are African-American.  Gender. Men are at higher risk than women before age 78. After age 54, women are at higher risk than men.  Having obstructive sleep apnea.  Stress. What are the signs or symptoms? Extremely high blood  pressure (hypertensive crisis) may cause:  Headache.  Anxiety.  Shortness of breath.  Nosebleed.  Nausea and vomiting.  Severe chest pain.  Jerky movements you cannot control (seizures). How is this diagnosed? This condition is diagnosed by measuring your blood pressure while you are seated, with your arm resting on a surface. The cuff of the blood pressure monitor will be placed directly against the skin of your upper arm at the level of your heart. It should be measured at least twice using the same arm. Certain conditions can cause a difference in blood pressure between your right and left arms. Certain factors can cause blood pressure readings to be lower or higher than normal (elevated) for a short period of time:  When your blood pressure is higher when you are in a health care provider's office than when you are at home, this is called white coat hypertension. Most people with this condition do not need medicines.  When your blood pressure is higher at home than when you are in a health care provider's office, this is called masked hypertension. Most people with this condition may need medicines to control blood pressure. If you have a high blood pressure reading during one visit or you have normal blood pressure with other risk factors:  You may be asked to return on a different day to have your blood pressure checked again.  You may be asked to monitor your blood pressure at home for 1 week or longer. If you are diagnosed with hypertension, you may have other blood or imaging tests to help your health care  provider understand your overall risk for other conditions. How is this treated? This condition is treated by making healthy lifestyle changes, such as eating healthy foods, exercising more, and reducing your alcohol intake. Your health care provider may prescribe medicine if lifestyle changes are not enough to get your blood pressure under control, and if:  Your systolic  blood pressure is above 130.  Your diastolic blood pressure is above 80. Your personal target blood pressure may vary depending on your medical conditions, your age, and other factors. Follow these instructions at home: Eating and drinking   Eat a diet that is high in fiber and potassium, and low in sodium, added sugar, and fat. An example eating plan is called the DASH (Dietary Approaches to Stop Hypertension) diet. To eat this way: ? Eat plenty of fresh fruits and vegetables. Try to fill half of your plate at each meal with fruits and vegetables. ? Eat whole grains, such as whole wheat pasta, brown rice, or whole grain bread. Fill about one quarter of your plate with whole grains. ? Eat or drink low-fat dairy products, such as skim milk or low-fat yogurt. ? Avoid fatty cuts of meat, processed or cured meats, and poultry with skin. Fill about one quarter of your plate with lean proteins, such as fish, chicken without skin, beans, eggs, and tofu. ? Avoid premade and processed foods. These tend to be higher in sodium, added sugar, and fat.  Reduce your daily sodium intake. Most people with hypertension should eat less than 1,500 mg of sodium a day.  Limit alcohol intake to no more than 1 drink a day for nonpregnant women and 2 drinks a day for men. One drink equals 12 oz of beer, 5 oz of wine, or 1 oz of hard liquor. Lifestyle   Work with your health care provider to maintain a healthy body weight or to lose weight. Ask what an ideal weight is for you.  Get at least 30 minutes of exercise that causes your heart to beat faster (aerobic exercise) most days of the week. Activities may include walking, swimming, or biking.  Include exercise to strengthen your muscles (resistance exercise), such as pilates or lifting weights, as part of your weekly exercise routine. Try to do these types of exercises for 30 minutes at least 3 days a week.  Do not use any products that contain nicotine or  tobacco, such as cigarettes and e-cigarettes. If you need help quitting, ask your health care provider.  Monitor your blood pressure at home as told by your health care provider.  Keep all follow-up visits as told by your health care provider. This is important. Medicines  Take over-the-counter and prescription medicines only as told by your health care provider. Follow directions carefully. Blood pressure medicines must be taken as prescribed.  Do not skip doses of blood pressure medicine. Doing this puts you at risk for problems and can make the medicine less effective.  Ask your health care provider about side effects or reactions to medicines that you should watch for. Contact a health care provider if:  You think you are having a reaction to a medicine you are taking.  You have headaches that keep coming back (recurring).  You feel dizzy.  You have swelling in your ankles.  You have trouble with your vision. Get help right away if:  You develop a severe headache or confusion.  You have unusual weakness or numbness.  You feel faint.  You have severe pain  in your chest or abdomen.  You vomit repeatedly.  You have trouble breathing. Summary  Hypertension is when the force of blood pumping through your arteries is too strong. If this condition is not controlled, it may put you at risk for serious complications.  Your personal target blood pressure may vary depending on your medical conditions, your age, and other factors. For most people, a normal blood pressure is less than 120/80.  Hypertension is treated with lifestyle changes, medicines, or a combination of both. Lifestyle changes include weight loss, eating a healthy, low-sodium diet, exercising more, and limiting alcohol. This information is not intended to replace advice given to you by your health care provider. Make sure you discuss any questions you have with your health care provider. Document Released:  05/30/2005 Document Revised: 04/27/2016 Document Reviewed: 04/27/2016 Elsevier Interactive Patient Education  2019 [employer].

## 2018-06-25 NOTE — Assessment & Plan Note (Addendum)
Stopped taking Prozac (weaned off) because pt felt she didn't need it. She has been feeling very good and doesn't feel it's necessary. No increased anxiety or depression since stopping Prozac

## 2018-06-25 NOTE — Assessment & Plan Note (Signed)
Blood pressure up today (165/66). Will recheck. Pt says it is much better when she takes it at home daily. (106ish).

## 2018-06-25 NOTE — Progress Notes (Signed)
Subjective:    Patient ID: [name], female    DOB: 04-May-1951, 68 y.o.   MRN: [id number]  No chief complaint on file.   HPI  Patient is in today for a four month follow up. She has no chief complaint. She recently (06/21/2018) got a procedure done for bladder stones. Cystoscopy with litholapaxy. Pt explained that she stopped taking prozac because she doesn't believe she needs it.  Patient Care Team: Mosie Lukes, MD as PCP - General (Family Medicine) Irine Seal, MD as Attending Physician (Urology) Clent Jacks, MD as Consulting Physician (Ophthalmology) Laurance Flatten, MD as Consulting Physician (Orthopedic Surgery) Cheryll Cockayne, MD as Consulting Physician (Surgical Oncology) Ronald Lobo, MD as Consulting Physician (Gastroenterology) Danella Sensing, MD as Consulting Physician (Dermatology)   Past Medical History:  Diagnosis Date  . Acute bronchitis 05/16/2015  . Anemia    after shoulder replacement  . Autonomic dysreflexia   . AVM (arteriovenous malformation) spine 1981  . Bowel dysfunction    constipation and fecal retention  . Cerumen impaction 10/11/2015  . Complication of anesthesia    low temperature after general anesthesia for colostomy  . Constipation 01/25/2016  . Contusion 05/18/2014   Of foot  . Depression 02/13/2017   History of   . Headache 04/09/2017  . History of kidney stones   . History of neurogenic bowel   . History of rotator cuff tear    Right  . History of ventral hernia   . Hyperglycemia 05/18/2015  . Hyperlipidemia   . Hypertension    No medications at this time  . Incomplete quadriplegia at C5-6 level ([place]) 07/24/2008   Qualifier: Diagnosis of  By: Wynona Luna Requires bowel regimen including enemas and indwelling foley cath Has had tendency towards pressure sores in past. Had a tear with a transfer in Hospital in past for surgery and developed a sore at that time.   . Low blood pressure reading    Occassionally  . Medicare  annual wellness visit, subsequent 11/02/2014   Follows with [facility] Declines colonoscopy  No h/o abnormal pap, declines further paps  . MVP (mitral valve prolapse) 12/01/2012   Mild  . Neurogenic bladder   . OA (osteoarthritis)    Hip and shoulders, neck  . Obesity 11/01/2016  . OSA on CPAP 01/31/2016  . Pain in joint, shoulder region 12/01/2012   Long history B/l Follows with Dr Hassell [facility] at [facility]  . Postmenopausal bleeding 12/17/2012  . Preventative health care 06/26/2013  . Quadriplegia ([place]) 1981   Secondary to ruptured AVM C5-C6  . Sacral pressure ulcer 12/01/2012   H/o Follows with Dr Tomi Likens of Dermatology  . Skin cancer 11/01/2016   2 [facility] RLE excised by Dr Wilhemina Bonito 2018 [place] LLE excised by Dr Wilhemina Bonito 2018  . Vaginitis and vulvovaginitis 12/17/2012  . Wears glasses     Past Surgical History:  Procedure Laterality Date  . ANAL TAG REMOVAL  2014  . BASAL CELL CARCINOMA EXCISION  2016   Lip  . BASAL CELL CARCINOMA EXCISION  2018   right leg x2  . BASAL CELL CARCINOMA EXCISION Left 2018   leg, chest  . BASAL CELL CARCINOMA EXCISION Right 2019   leg  . BLADDER STONE REMOVAL  1986/87  . COLONOSCOPY  02/22/2016  . COLOSTOMY  02/22/2016  . CYSTOSCOPY WITH BIOPSY N/A 06/21/2018   Procedure: CYSTOSCOPY WITH BIOPSY FULGURATION;  Surgeon: Irine Seal, MD;  Location: [facility];  Service: Urology;  Laterality: N/A;  . CYSTOSCOPY WITH LITHOLAPAXY N/A 06/21/2018   Procedure: CYSTOSCOPY WITH LITHOLAPAXY;  Surgeon: Irine Seal, MD;  Location: [facility];  Service: Urology;  Laterality: N/A;  . DORSAL CHEILECTOMY-HALLUX RIGIDUS Bilateral 2011  . HERNIA REPAIR  3354   VENTRAL umbilical hernia repair  . LAMINECTOMY  10/23/1979   AVM  . LAPAROSCOPIC SIGMOID COLECTOMY  02/22/2016  . [place]   left shoulder  . ROTATOR CUFF REPAIR Right 2001  . SHOULDER ARTHROSCOPY  2007   right  . SQUAMOUS CELL CARCINOMA EXCISION  Left 2018   leg  . Toe Nails Removal Bilateral 1981   both great toes  . TOTAL SHOULDER REPLACEMENT  2001   left  . TRANSFER TENDON HAND  1982, 1983   to right hand X3  . VENTRAL HERNIA REPAIR  08/17/2010   Postop ileus (Dr. Redmond Pulling)    Family History  Problem Relation Age of Onset  . Cancer Mother        breast & melanoma  . Hypertension Mother   . Heart disease Father        CAD, MI, AFib  . Cancer Sister        melanoma  . Cancer Maternal Grandfather        melanoma  . Stroke Paternal Grandmother   . Heart disease Paternal Grandfather   . Cancer Maternal Aunt        breast  . Cancer Paternal Aunt        breast  . Cancer Paternal Uncle        breast  . Cancer Paternal Aunt        breast    Social History   Socioeconomic History  . Marital status: Single    Spouse name: Not on file  . Number of children: Not on file  . Years of education: Not on file  . Highest education level: Not on file  Occupational History  . Occupation: Disabled    Comment: Music therapist  Social Needs  . Financial resource strain: Not on file  . Food insecurity:    Worry: Not on file    Inability: Not on file  . Transportation needs:    Medical: Not on file    Non-medical: Not on file  Tobacco Use  . Smoking status: Never Smoker  . Smokeless tobacco: Never Used  Substance and Sexual Activity  . Alcohol use: No  . Drug use: No  . Sexual activity: Not on file    Comment: lives with sister and mother, no major dietary restrictions  Lifestyle  . Physical activity:    Days per week: Not on file    Minutes per session: Not on file  . Stress: Not on file  Relationships  . Social connections:    Talks on phone: Not on file    Gets together: Not on file    Attends religious service: Not on file    Active member of club or organization: Not on file    Attends meetings of clubs or organizations: Not on file    Relationship status: Not on file  . Intimate partner violence:    Fear  of current or ex partner: Not on file    Emotionally abused: Not on file    Physically abused: Not on file    Forced sexual activity: Not on file  Other Topics Concern  . Not on file  Social History Narrative   Sister is primary caregiver    Outpatient Medications Prior to Visit  Medication Sig Dispense Refill  . baclofen (LIORESAL) 20 MG tablet Take 0.5-1 tablets (10-20 mg total) by mouth 4 (four) times daily as needed for muscle spasms. 90 each 3  . clobetasol (TEMOVATE) 0.05 % external solution Apply 1 application topically every Monday, Wednesday, and Friday. Use on scalp 3 times per week    . docusate sodium (COLACE) 100 MG capsule Take 1 tablet by mouth daily.    Marland Kitchen FIBER PO Take 1 capsule by mouth daily.    . furosemide (LASIX) 20 MG tablet Take 1 tablet (20 mg total) by mouth 2 (two) times daily. 180 tablet 1  . Multiple Vitamins-Calcium (ONE-A-DAY WOMENS FORMULA PO) Take 1 tablet by mouth daily.      . mupirocin ointment (BACTROBAN) 2 % Place 1 application into the nose 2 (two) times daily. (Patient not taking: Reported on 06/21/2018) 22 g 1  . oxybutynin (DITROPAN) 5 MG tablet Take 5 mg by mouth every morning.      . potassium chloride SA (K-DUR,KLOR-CON) 20 MEQ tablet Take 2 tablets (40 mEq total) by mouth 2 (two) times daily. 360 tablet 1  . Probiotic Product (DIGESTIVE ADVANTAGE) CAPS Take 1 tablet by mouth daily.    . rosuvastatin (CRESTOR) 5 MG tablet TAKE 1 TABLET BY MOUTH EVERY OTHER DAY (Patient taking differently: Take 5 mg by mouth every Monday, Wednesday, and Friday. ) 15 tablet 5  . FLUoxetine (PROZAC) 10 MG tablet Take 1 tablet (10 mg total) by mouth daily. (Patient not taking: Reported on 06/21/2018) 30 tablet 2   No facility-administered medications prior to visit.     Allergies  Allergen Reactions  . Adhesive [Tape] Other (See Comments)    Skin irritation Does not tolerate [employer], tolerates [place]    Review of Systems  Constitutional: Negative for fever,  malaise/fatigue and weight loss.  HENT: Negative for ear discharge, ear pain, hearing loss and sore throat.   Respiratory: Negative for shortness of breath.   Cardiovascular: Negative for chest pain, palpitations, orthopnea and PND.  Gastrointestinal: Negative for abdominal pain, constipation and diarrhea.  Genitourinary: Negative for dysuria.       Objective:    Physical Exam Constitutional:      Appearance: Normal appearance.  HENT:     Head: Normocephalic and atraumatic.     Right Ear: Tympanic membrane normal. There is impacted cerumen.     Left Ear: Tympanic membrane normal. There is impacted cerumen.     Nose: Nose normal.     Mouth/Throat:     Mouth: Mucous membranes are moist.     Pharynx: Oropharynx is clear.  Eyes:     Pupils: Pupils are equal, round, and reactive to light.  Cardiovascular:     Pulses: Normal pulses.     Heart sounds: Normal heart sounds.  Pulmonary:     Effort: Pulmonary effort is normal.     Breath sounds: Normal breath sounds.  Abdominal:     General: Bowel sounds are normal.     Palpations: Abdomen is soft.  Neurological:     Mental Status: She is alert.     BP (!) 165/66 (BP Location: Left Arm, Patient Position: Sitting, Cuff Size: Normal)   Pulse 61   Temp 98.2 F (36.8 C) (Oral)   Resp 18   Wt 104.3 kg   SpO2 99%   BMI 31.63 kg/m  Wt Readings from Last  3 Encounters:  06/25/18 104.3 kg  06/21/18 104.3 kg  02/19/18 102.1 kg   BP Readings from Last 3 Encounters:  06/25/18 (!) 165/66  06/21/18 (!) 148/82  02/19/18 134/72     Immunization History  Administered Date(s) Administered  . H1N1 07/24/2008  . Influenza Split 04/05/2011, 04/18/2012  . Influenza Whole 06/03/2008, 03/31/2009, 04/01/2010  . Influenza, High Dose Seasonal PF 02/19/2018  . Influenza,inj,Quad PF,6+ Mos 03/20/2013, 03/03/2014, 05/18/2015, 04/20/2016, 04/06/2017  . Pneumococcal Conjugate-13 06/24/2013  . Pneumococcal Polysaccharide-23 12/07/2005  .  Pneumococcal-Unspecified 12/07/2005, 06/24/2013  . Td 06/03/2008  . Zoster 11/26/2012    Health Maintenance  Topic Date Due  . HIV Screening  10/30/1967  . DEXA SCAN  10/29/2017  . PNA vac Low Risk Adult (2 of 2 - PPSV23) 10/29/2017  . MAMMOGRAM  04/18/2018  . Fecal DNA (Cologuard)  05/23/2018  . TETANUS/TDAP  06/03/2018  . PAP SMEAR-Modifier  05/19/2019  . INFLUENZA VACCINE  Completed  . Hepatitis C Screening  Completed    Lab Results  Component Value Date   WBC 6.4 06/21/2018   HGB 13.6 06/21/2018   HCT 42.9 06/21/2018   PLT 198 06/21/2018   GLUCOSE 105 (H) 06/21/2018   CHOL 185 02/19/2018   TRIG 175.0 (H) 02/19/2018   HDL 39.50 02/19/2018   LDLDIRECT 105.0 02/06/2017   LDLCALC 110 (H) 02/19/2018   ALT 19 02/19/2018   AST 13 02/19/2018   NA 138 06/21/2018   K 3.9 06/21/2018   CL 106 06/21/2018   CREATININE 0.49 06/21/2018   BUN 10 06/21/2018   CO2 26 06/21/2018   TSH 2.50 02/19/2018   HGBA1C 5.9 02/19/2018    Lab Results  Component Value Date   TSH 2.50 02/19/2018   Lab Results  Component Value Date   WBC 6.4 06/21/2018   HGB 13.6 06/21/2018   HCT 42.9 06/21/2018   MCV 85.6 06/21/2018   PLT 198 06/21/2018   Lab Results  Component Value Date   NA 138 06/21/2018   K 3.9 06/21/2018   CO2 26 06/21/2018   GLUCOSE 105 (H) 06/21/2018   BUN 10 06/21/2018   CREATININE 0.49 06/21/2018   BILITOT 0.4 02/19/2018   ALKPHOS 85 02/19/2018   AST 13 02/19/2018   ALT 19 02/19/2018   PROT 7.1 02/19/2018   ALBUMIN 4.0 02/19/2018   CALCIUM 8.6 (L) 06/21/2018   ANIONGAP 6 06/21/2018   GFR 134.58 02/19/2018   Lab Results  Component Value Date   CHOL 185 02/19/2018   Lab Results  Component Value Date   HDL 39.50 02/19/2018   Lab Results  Component Value Date   LDLCALC 110 (H) 02/19/2018   Lab Results  Component Value Date   TRIG 175.0 (H) 02/19/2018   Lab Results  Component Value Date   CHOLHDL 5 02/19/2018   Lab Results  Component Value Date    HGBA1C 5.9 02/19/2018         Assessment & Plan:   Problem List Items Addressed This Visit      Cardiovascular and Mediastinum   Essential hypertension    Blood pressure up today (165/66). Will recheck. Pt says it is much better when she takes it at home daily. (106ish).      Relevant Orders   TSH   Hepatic function panel     Other   Hyperlipidemia - Primary   Relevant Orders   Lipid Profile   Hyperglycemia   Relevant Orders   Hemoglobin A1c   Depression  Stopped taking Prozac (weaned off) because pt felt she didn't need it. She has been feeling very good and doesn't feel it's necessary.       Other Visit Diagnoses    Kidney stone       Relevant Orders   Urine Culture   Urinalysis      I have discontinued Sascha A. Boxwell's FLUoxetine. I am also having her maintain her Multiple Vitamins-Calcium (ONE-A-DAY WOMENS FORMULA PO), oxybutynin, clobetasol, DIGESTIVE ADVANTAGE, docusate sodium, mupirocin ointment, FIBER PO, baclofen, furosemide, potassium chloride SA, and rosuvastatin.  No orders of the defined types were placed in this encounter.   Court Joy, Student-PA   Patient seen with and examined with student.  Agree with documentation See separate note for further documentation

## 2018-06-26 LAB — HEPATIC FUNCTION PANEL
ALT: 32 U/L (ref 0–35)
AST: 21 U/L (ref 0–37)
Albumin: 4 g/dL (ref 3.5–5.2)
Alkaline Phosphatase: 86 U/L (ref 39–117)
Bilirubin, Direct: 0.1 mg/dL (ref 0.0–0.3)
Total Bilirubin: 0.4 mg/dL (ref 0.2–1.2)
Total Protein: 7.2 g/dL (ref 6.0–8.3)

## 2018-06-26 LAB — LIPID PANEL
Cholesterol: 187 mg/dL (ref 0–200)
HDL: 39.6 mg/dL (ref >39.00)
NonHDL: 147
Total CHOL/HDL Ratio: 5
Triglycerides: 210 mg/dL — ABNORMAL HIGH (ref 0.0–149.0)
VLDL: 42 mg/dL — ABNORMAL HIGH (ref 0.0–40.0)

## 2018-06-26 LAB — TSH: TSH: 2.93 u[IU]/mL (ref 0.35–4.50)

## 2018-06-26 LAB — HEMOGLOBIN A1C: Hgb A1c MFr Bld: 6.2 % (ref 4.6–6.5)

## 2018-06-26 LAB — LDL CHOLESTEROL, DIRECT: Direct LDL: 125 mg/dL

## 2018-06-27 NOTE — Assessment & Plan Note (Signed)
hgba1c acceptable, minimize simple carbs. Increase exercise as tolerated.  

## 2018-06-27 NOTE — Assessment & Plan Note (Signed)
Continues to do well at home. She lives with her sister who has health problems but between the two of them they manage ADLs and care for their home.

## 2018-06-27 NOTE — Progress Notes (Signed)
Subjective:    Patient ID: [name], female    DOB: Mar 31, 1951, 68 y.o.   MRN: [id number]  No chief complaint on file.   HPI Patient is in today for follow up. She feels well today. She chose to stop the Prozac and continues to feel well. Does not endorse anhedonia. She is not struggling with constipation since having her colostomy bag in place. Denies CP/palp/SOB/HA/congestion/fevers/GI or GU c/o. Taking meds as prescribed  Past Medical History:  Diagnosis Date  . Acute bronchitis 05/16/2015  . Anemia    after shoulder replacement  . Autonomic dysreflexia   . AVM (arteriovenous malformation) spine 1981  . Bowel dysfunction    constipation and fecal retention  . Cerumen impaction 10/11/2015  . Complication of anesthesia    low temperature after general anesthesia for colostomy  . Constipation 01/25/2016  . Contusion 05/18/2014   Of foot  . Depression 02/13/2017   History of   . Headache 04/09/2017  . History of kidney stones   . History of neurogenic bowel   . History of rotator cuff tear    Right  . History of ventral hernia   . Hyperglycemia 05/18/2015  . Hyperlipidemia   . Hypertension    No medications at this time  . Incomplete quadriplegia at C5-6 level ([place]) 07/24/2008   Qualifier: Diagnosis of  By: Wynona Luna Requires bowel regimen including enemas and indwelling foley cath Has had tendency towards pressure sores in past. Had a tear with a transfer in Hospital in past for surgery and developed a sore at that time.   . Low blood pressure reading    Occassionally  . Medicare annual wellness visit, subsequent 11/02/2014   Follows with [facility] Declines colonoscopy  No h/o abnormal pap, declines further paps  . MVP (mitral valve prolapse) 12/01/2012   Mild  . Neurogenic bladder   . OA (osteoarthritis)    Hip and shoulders, neck  . Obesity 11/01/2016  . OSA on CPAP 01/31/2016  . Pain in joint, shoulder region 12/01/2012   Long history B/l Follows with Dr  Hassell Done at [facility]  . Postmenopausal bleeding 12/17/2012  . Preventative health care 06/26/2013  . Quadriplegia ([place]) 1981   Secondary to ruptured AVM C5-C6  . Sacral pressure ulcer 12/01/2012   H/o Follows with Dr Tomi Likens of Dermatology  . Skin cancer 11/01/2016   2 [facility] RLE excised by Dr Wilhemina Bonito 2018 [place] LLE excised by Dr Wilhemina Bonito 2018  . Vaginitis and vulvovaginitis 12/17/2012  . Wears glasses     Past Surgical History:  Procedure Laterality Date  . ANAL TAG REMOVAL  2014  . BASAL CELL CARCINOMA EXCISION  2016   Lip  . BASAL CELL CARCINOMA EXCISION  2018   right leg x2  . BASAL CELL CARCINOMA EXCISION Left 2018   leg, chest  . BASAL CELL CARCINOMA EXCISION Right 2019   leg  . BLADDER STONE REMOVAL  1986/87  . COLONOSCOPY  02/22/2016  . COLOSTOMY  02/22/2016  . CYSTOSCOPY WITH BIOPSY N/A 06/21/2018   Procedure: CYSTOSCOPY WITH BIOPSY FULGURATION;  Surgeon: Irine Seal, MD;  Location[facility];  Service: Urology;  Laterality: N/A;  . CYSTOSCOPY WITH LITHOLAPAXY N/A 06/21/2018   Procedure: CYSTOSCOPY WITH LITHOLAPAXY;  Surgeon: Irine Seal, MD;  Location: [facility];  Service: Urology;  Laterality: N/A;  . DORSAL CHEILECTOMY-HALLUX RIGIDUS Bilateral 2011  . HERNIA REPAIR  2012  VENTRAL umbilical hernia repair  . LAMINECTOMY  10/23/1979   AVM  . LAPAROSCOPIC SIGMOID COLECTOMY  02/22/2016  . [place]   left shoulder  . ROTATOR CUFF REPAIR Right 2001  . SHOULDER ARTHROSCOPY  2007   right  . SQUAMOUS CELL CARCINOMA EXCISION Left 2018   leg  . Toe Nails Removal Bilateral 1981   both great toes  . TOTAL SHOULDER REPLACEMENT  2001   left  . TRANSFER TENDON HAND  1982, 1983   to right hand X3  . VENTRAL HERNIA REPAIR  08/17/2010   Postop ileus (Dr. Redmond Pulling)    Family History  Problem Relation Age of Onset  . Cancer Mother        breast & melanoma  . Hypertension Mother   . Heart disease Father         CAD, MI, AFib  . Cancer Sister        melanoma  . Cancer Maternal Grandfather        melanoma  . Stroke Paternal Grandmother   . Heart disease Paternal Grandfather   . Cancer Maternal Aunt        breast  . Cancer Paternal Aunt        breast  . Cancer Paternal Uncle        breast  . Cancer Paternal Aunt        breast    Social History   Socioeconomic History  . Marital status: Single    Spouse name: Not on file  . Number of children: Not on file  . Years of education: Not on file  . Highest education level: Not on file  Occupational History  . Occupation: Disabled    Comment: Music therapist  Social Needs  . Financial resource strain: Not on file  . Food insecurity:    Worry: Not on file    Inability: Not on file  . Transportation needs:    Medical: Not on file    Non-medical: Not on file  Tobacco Use  . Smoking status: Never Smoker  . Smokeless tobacco: Never Used  Substance and Sexual Activity  . Alcohol use: No  . Drug use: No  . Sexual activity: Not on file    Comment: lives with sister and mother, no major dietary restrictions  Lifestyle  . Physical activity:    Days per week: Not on file    Minutes per session: Not on file  . Stress: Not on file  Relationships  . Social connections:    Talks on phone: Not on file    Gets together: Not on file    Attends religious service: Not on file    Active member of club or organization: Not on file    Attends meetings of clubs or organizations: Not on file    Relationship status: Not on file  . Intimate partner violence:    Fear of current or ex partner: Not on file    Emotionally abused: Not on file    Physically abused: Not on file    Forced sexual activity: Not on file  Other Topics Concern  . Not on file  Social History Narrative   Sister is primary caregiver    Outpatient Medications Prior to Visit  Medication Sig Dispense Refill  . baclofen (LIORESAL) 20 MG tablet Take 0.5-1 tablets (10-20 mg total)  by mouth 4 (four) times daily as needed for muscle spasms. 90 each 3  . clobetasol (TEMOVATE) 0.05 %  external solution Apply 1 application topically every Monday, Wednesday, and Friday. Use on scalp 3 times per week    . docusate sodium (COLACE) 100 MG capsule Take 1 tablet by mouth daily.    Marland Kitchen FIBER PO Take 1 capsule by mouth daily.    . furosemide (LASIX) 20 MG tablet Take 1 tablet (20 mg total) by mouth 2 (two) times daily. 180 tablet 1  . Multiple Vitamins-Calcium (ONE-A-DAY WOMENS FORMULA PO) Take 1 tablet by mouth daily.      . mupirocin ointment (BACTROBAN) 2 % Place 1 application into the nose 2 (two) times daily. (Patient not taking: Reported on 06/21/2018) 22 g 1  . oxybutynin (DITROPAN) 5 MG tablet Take 5 mg by mouth every morning.      . potassium chloride SA (K-DUR,KLOR-CON) 20 MEQ tablet Take 2 tablets (40 mEq total) by mouth 2 (two) times daily. 360 tablet 1  . Probiotic Product (DIGESTIVE ADVANTAGE) CAPS Take 1 tablet by mouth daily.    . rosuvastatin (CRESTOR) 5 MG tablet TAKE 1 TABLET BY MOUTH EVERY OTHER DAY (Patient taking differently: Take 5 mg by mouth every Monday, Wednesday, and Friday. ) 15 tablet 5  . FLUoxetine (PROZAC) 10 MG tablet Take 1 tablet (10 mg total) by mouth daily. (Patient not taking: Reported on 06/21/2018) 30 tablet 2   No facility-administered medications prior to visit.     Allergies  Allergen Reactions  . Adhesive [Tape] Other (See Comments)    Skin irritation Does not tolerate [employer], tolerates [place]    Review of Systems  Constitutional: Negative for fever and malaise/fatigue.  HENT: Negative for congestion.   Eyes: Negative for blurred vision.  Respiratory: Negative for shortness of breath.   Cardiovascular: Negative for chest pain, palpitations and leg swelling.  Gastrointestinal: Negative for abdominal pain, blood in stool and nausea.  Genitourinary: Negative for dysuria and frequency.  Musculoskeletal: Negative for falls.  Skin:  Negative for rash.  Neurological: Positive for sensory change and focal weakness. Negative for dizziness, loss of consciousness and headaches.  Endo/Heme/Allergies: Negative for environmental allergies.  Psychiatric/Behavioral: Negative for depression. The patient is not nervous/anxious.        Objective:    Physical Exam Vitals signs and nursing note reviewed.  Constitutional:      General: She is not in acute distress.    Appearance: She is well-developed.     Comments: In a wheelchair  HENT:     Head: Normocephalic and atraumatic.     Nose: Nose normal.  Eyes:     General:        Right eye: No discharge.        Left eye: No discharge.  Neck:     Musculoskeletal: Normal range of motion and neck supple.  Cardiovascular:     Rate and Rhythm: Normal rate and regular rhythm.     Heart sounds: No murmur.  Pulmonary:     Effort: Pulmonary effort is normal.     Breath sounds: Normal breath sounds.  Abdominal:     General: Bowel sounds are normal.     Palpations: Abdomen is soft.     Tenderness: There is no abdominal tenderness.  Skin:    General: Skin is warm and dry.  Neurological:     Mental Status: She is alert and oriented to person, place, and time.     BP (!) 165/66 (BP Location: Left Arm, Patient Position: Sitting, Cuff Size: Normal)   Pulse 61   Temp 98.2  F (36.8 C) (Oral)   Resp 18   Wt 230 lb (104.3 kg)   SpO2 99%   BMI 31.63 kg/m  Wt Readings from Last 3 Encounters:  06/25/18 230 lb (104.3 kg)  06/21/18 230 lb (104.3 kg)  02/19/18 225 lb (102.1 kg)     Lab Results  Component Value Date   WBC 6.4 06/21/2018   HGB 13.6 06/21/2018   HCT 42.9 06/21/2018   PLT 198 06/21/2018   GLUCOSE 105 (H) 06/21/2018   CHOL 187 06/25/2018   TRIG 210.0 (H) 06/25/2018   HDL 39.60 06/25/2018   LDLDIRECT 125.0 06/25/2018   LDLCALC 110 (H) 02/19/2018   ALT 32 06/25/2018   AST 21 06/25/2018   NA 138 06/21/2018   K 3.9 06/21/2018   CL 106 06/21/2018   CREATININE  0.49 06/21/2018   BUN 10 06/21/2018   CO2 26 06/21/2018   TSH 2.93 06/25/2018   HGBA1C 6.2 06/25/2018    Lab Results  Component Value Date   TSH 2.93 06/25/2018   Lab Results  Component Value Date   WBC 6.4 06/21/2018   HGB 13.6 06/21/2018   HCT 42.9 06/21/2018   MCV 85.6 06/21/2018   PLT 198 06/21/2018   Lab Results  Component Value Date   NA 138 06/21/2018   K 3.9 06/21/2018   CO2 26 06/21/2018   GLUCOSE 105 (H) 06/21/2018   BUN 10 06/21/2018   CREATININE 0.49 06/21/2018   BILITOT 0.4 06/25/2018   ALKPHOS 86 06/25/2018   AST 21 06/25/2018   ALT 32 06/25/2018   PROT 7.2 06/25/2018   ALBUMIN 4.0 06/25/2018   CALCIUM 8.6 (L) 06/21/2018   ANIONGAP 6 06/21/2018   GFR 134.58 02/19/2018   Lab Results  Component Value Date   CHOL 187 06/25/2018   Lab Results  Component Value Date   HDL 39.60 06/25/2018   Lab Results  Component Value Date   LDLCALC 110 (H) 02/19/2018   Lab Results  Component Value Date   TRIG 210.0 (H) 06/25/2018   Lab Results  Component Value Date   CHOLHDL 5 06/25/2018   Lab Results  Component Value Date   HGBA1C 6.2 06/25/2018       Assessment & Plan:   Problem List Items Addressed This Visit    Hyperlipidemia - Primary    Encouraged heart healthy diet, increase exercise, avoid trans fats, consider a krill oil cap daily      Relevant Orders   Lipid Profile (Completed)   Incomplete quadriplegia at C5-6 level ([place])    Continues to do well at home. She lives with her sister who has health problems but between the two of them they manage ADLs and care for their home.       Essential hypertension    Blood pressure up today (165/66). Will recheck. Pt says it is much better when she takes it at home daily. (106ish).      Relevant Orders   TSH (Completed)   Hepatic function panel (Completed)   Hyperglycemia    hgba1c acceptable, minimize simple carbs. Increase exercise as tolerated.       Relevant Orders   Hemoglobin A1c  (Completed)   Depression    Stopped taking Prozac (weaned off) because pt felt she didn't need it. She has been feeling very good and doesn't feel it's necessary. No increased anxiety or depression since stopping Prozac      Kidney stone   Relevant Orders   Urine Culture  Urinalysis      I have discontinued Lekisha A. Newport's FLUoxetine. I am also having her maintain her Multiple Vitamins-Calcium (ONE-A-DAY WOMENS FORMULA PO), oxybutynin, clobetasol, DIGESTIVE ADVANTAGE, docusate sodium, mupirocin ointment, FIBER PO, baclofen, furosemide, potassium chloride SA, and rosuvastatin.  No orders of the defined types were placed in this encounter.    Penni Homans, MD

## 2018-06-27 NOTE — Assessment & Plan Note (Signed)
Encouraged heart healthy diet, increase exercise, avoid trans fats, consider a krill oil cap daily 

## 2018-06-29 MED ORDER — ROSUVASTATIN CALCIUM 5 MG PO TABS: 5.0000 mg | ORAL_TABLET | Freq: Every day | ORAL | 3 refills | Status: DC

## 2018-06-29 NOTE — Addendum Note (Signed)
Addended by: Magdalene Molly A on: 06/29/2018 04:39 PM   Modules accepted: Orders

## 2018-07-02 ENCOUNTER — Other Ambulatory Visit (INDEPENDENT_AMBULATORY_CARE_PROVIDER_SITE_OTHER): Payer: Medicare Other

## 2018-07-02 DIAGNOSIS — N200 Calculus of kidney: Secondary | ICD-10-CM | POA: Diagnosis not present

## 2018-07-02 LAB — POC URINALSYSI DIPSTICK (AUTOMATED)
Bilirubin, UA: NEGATIVE
Blood, UA: NEGATIVE
Glucose, UA: NEGATIVE
Ketones, UA: NEGATIVE
Leukocytes, UA: NEGATIVE
Nitrite, UA: NEGATIVE
Protein, UA: NEGATIVE
Spec Grav, UA: 1.02 (ref 1.010–1.025)
Urobilinogen, UA: 0.2 E.U./dL
pH, UA: 5.5 (ref 5.0–8.0)

## 2018-07-04 LAB — URINE CULTURE
MICRO NUMBER:: 77857
SPECIMEN QUALITY:: ADEQUATE

## 2018-07-10 ENCOUNTER — Encounter: Payer: Self-pay | Admitting: Family Medicine

## 2018-07-12 NOTE — Telephone Encounter (Signed)
Printed copies off and scanned into chart

## 2018-07-12 NOTE — Telephone Encounter (Signed)
Done

## 2018-07-16 LAB — HM MAMMOGRAPHY

## 2018-07-18 ENCOUNTER — Telehealth: Payer: Self-pay | Admitting: *Deleted

## 2018-07-18 NOTE — Telephone Encounter (Signed)
Received Mammogram results from [place]; forwarded to provider/SLS 02/05

## 2018-07-21 ENCOUNTER — Encounter: Payer: Self-pay | Admitting: Family Medicine

## 2018-07-26 ENCOUNTER — Encounter: Payer: Self-pay | Admitting: Family Medicine

## 2018-08-21 ENCOUNTER — Other Ambulatory Visit: Payer: Self-pay | Admitting: Family Medicine

## 2018-09-11 ENCOUNTER — Encounter: Payer: Self-pay | Admitting: Family Medicine

## 2018-09-29 ENCOUNTER — Other Ambulatory Visit: Payer: Self-pay | Admitting: Family Medicine

## 2018-10-22 ENCOUNTER — Other Ambulatory Visit: Payer: Self-pay

## 2018-10-22 ENCOUNTER — Ambulatory Visit: Payer: Medicare Other | Admitting: Family Medicine

## 2018-10-22 ENCOUNTER — Ambulatory Visit (INDEPENDENT_AMBULATORY_CARE_PROVIDER_SITE_OTHER): Payer: Medicare Other | Admitting: Family Medicine

## 2018-10-22 DIAGNOSIS — K5909 Other constipation: Secondary | ICD-10-CM

## 2018-10-22 DIAGNOSIS — N200 Calculus of kidney: Secondary | ICD-10-CM | POA: Diagnosis not present

## 2018-10-22 DIAGNOSIS — F329 Major depressive disorder, single episode, unspecified: Secondary | ICD-10-CM

## 2018-10-22 DIAGNOSIS — F32A Depression, unspecified: Secondary | ICD-10-CM

## 2018-10-22 DIAGNOSIS — R739 Hyperglycemia, unspecified: Secondary | ICD-10-CM | POA: Diagnosis not present

## 2018-10-22 DIAGNOSIS — I10 Essential (primary) hypertension: Secondary | ICD-10-CM

## 2018-10-22 NOTE — Progress Notes (Signed)
Virtual Visit via Video Note  I connected with [name] on 10/22/18 at  3:00 PM EDT by a video enabled telemedicine application and verified that I am speaking with the correct person using two identifiers.  Location: Patient: home  Provider: office   I discussed the limitations of evaluation and management by telemedicine and the availability of in person appointments. The patient expressed understanding and agreed to proceed.Magdalene Molly, CMA was able to get patient set up on video platform    Subjective:    Patient ID: [name], female    DOB: [birth year], 68 y.o.   MRN: [id number]  No chief complaint on file.   HPI Patient is in today for follow up on chronic medical conditions including hypertension, quadriplegia, hyperglycemia, kidney stones and more. Has been struggling with increased kidney stones this year and notes the only change was a calcium/vitamin D tab addition. No recent febrile illness or hospitalizations. No c/o polydipsia. Denies CP/palp/SOB/HA/congestion/fevers/GI or GU c/o. Taking meds as prescribed  Past Medical History:  Diagnosis Date  . Acute bronchitis 05/16/2015  . Anemia    after shoulder replacement  . Autonomic dysreflexia   . AVM (arteriovenous malformation) spine 1981  . Bowel dysfunction    constipation and fecal retention  . Cerumen impaction 10/11/2015  . Complication of anesthesia    low temperature after general anesthesia for colostomy  . Constipation 01/25/2016  . Contusion 05/18/2014   Of foot  . Depression 02/13/2017   History of   . Headache 04/09/2017  . History of kidney stones   . History of neurogenic bowel   . History of rotator cuff tear    Right  . History of ventral hernia   . Hyperglycemia 05/18/2015  . Hyperlipidemia   . Hypertension    No medications at this time  . Incomplete quadriplegia at C5-6 level ([place]) 07/24/2008   Qualifier: Diagnosis of  By: Wynona Luna Requires bowel regimen including enemas and  indwelling foley cath Has had tendency towards pressure sores in past. Had a tear with a transfer in Hospital in past for surgery and developed a sore at that time.   . Low blood pressure reading    Occassionally  . Medicare annual wellness visit, subsequent 11/02/2014   Follows with [facility] Declines colonoscopy  No h/o abnormal pap, declines further paps  . MVP (mitral valve prolapse) 12/01/2012   Mild  . Neurogenic bladder   . OA (osteoarthritis)    Hip and shoulders, neck  . Obesity 11/01/2016  . OSA on CPAP 01/31/2016  . Pain in joint, shoulder region 12/01/2012   Long history B/l Follow[facility] Hassell [facility]  . Postmenopausal bleeding 12/17/2012  . Preventative health care 06/26/2013  . Quadriplegia ([place]) 1981   Secondary to ruptured AVM C5-C6  . Sacral pressure ulcer 12/01/2012   H/o Follows with Dr Tomi Likens of Dermatology  . Skin cancer 11/01/2016   2 [facility] RLE excised by Dr Wilhemina Bonito 2018 [place] LLE excised by Dr Wilhemina Bonito 2018  . Vaginitis and vulvovaginitis 12/17/2012  . Wears glasses     Past Surgical History:  Procedure Laterality Date  . ANAL TAG REMOVAL  2014  . BASAL CELL CARCINOMA EXCISION  2016   Lip  . BASAL CELL CARCINOMA EXCISION  2018   right leg x2  . BASAL CELL CARCINOMA EXCISION Left 2018   leg, chest  . BASAL CELL CARCINOMA EXCISION Right 2019  leg  . BLADDER STONE REMOVAL  1986/87  . COLONOSCOPY  02/22/2016  . COLOSTOMY  02/22/2016  . CYSTOSCOPY WITH BIOPSY N/A 06/21/2018   Procedure: CYSTOSCOPY WITH BIOPSY FULGURATION;  Surgeon: Irine Seal, MD;  Location[facility];  Service: Urology;  Laterality: N/A;  . CYSTOSCOPY WITH LITHOLAPAXY N/A 06/21/2018   Procedure: CYSTOSCOPY WITH LITHOLAPAXY;  Surgeon: Irine Seal, MD;  Location: [facility];  Service: Urology;  Laterality: N/A;  . DORSAL CHEILECTOMY-HALLUX RIGIDUS Bilateral 2011  . HERNIA REPAIR  7782   VENTRAL umbilical hernia repair  . LAMINECTOMY   10/23/1979   AVM  . LAPAROSCOPIC SIGMOID COLECTOMY  02/22/2016  . [place]   left shoulder  . ROTATOR CUFF REPAIR Right 2001  . SHOULDER ARTHROSCOPY  2007   right  . SQUAMOUS CELL CARCINOMA EXCISION Left 2018   leg  . Toe Nails Removal Bilateral 1981   both great toes  . TOTAL SHOULDER REPLACEMENT  2001   left  . TRANSFER TENDON HAND  1982, 1983   to right hand X3  . VENTRAL HERNIA REPAIR  08/17/2010   Postop ileus (Dr. Redmond Pulling)    Family History  Problem Relation Age of Onset  . Cancer Mother        breast & melanoma  . Hypertension Mother   . Heart disease Father        CAD, MI, AFib  . Cancer Sister        melanoma  . Cancer Maternal Grandfather        melanoma  . Stroke Paternal Grandmother   . Heart disease Paternal Grandfather   . Cancer Maternal Aunt        breast  . Cancer Paternal Aunt        breast  . Cancer Paternal Uncle        breast  . Cancer Paternal Aunt        breast    Social History   Socioeconomic History  . Marital status: Single    Spouse name: Not on file  . Number of children: Not on file  . Years of education: Not on file  . Highest education level: Not on file  Occupational History  . Occupation: Disabled    Comment: Music therapist  Social Needs  . Financial resource strain: Not on file  . Food insecurity:    Worry: Not on file    Inability: Not on file  . Transportation needs:    Medical: Not on file    Non-medical: Not on file  Tobacco Use  . Smoking status: Never Smoker  . Smokeless tobacco: Never Used  Substance and Sexual Activity  . Alcohol use: No  . Drug use: No  . Sexual activity: Not on file    Comment: lives with sister and mother, no major dietary restrictions  Lifestyle  . Physical activity:    Days per week: Not on file    Minutes per session: Not on file  . Stress: Not on file  Relationships  . Social connections:    Talks on phone: Not on file    Gets together: Not on file     Attends religious service: Not on file    Active member of club or organization: Not on file    Attends meetings of clubs or organizations: Not on file    Relationship status: Not on file  . Intimate partner violence:    Fear of current or  ex partner: Not on file    Emotionally abused: Not on file    Physically abused: Not on file    Forced sexual activity: Not on file  Other Topics Concern  . Not on file  Social History Narrative   Sister is primary caregiver    Outpatient Medications Prior to Visit  Medication Sig Dispense Refill  . baclofen (LIORESAL) 20 MG tablet TAKE 1/2 TO 1 (10-20MG S) TABLET BY MOUTH4 TIMES DAILY AS NEEDED FOR MUSCLE SPASMS 90 each 3  . clobetasol (TEMOVATE) 0.05 % external solution Apply 1 application topically every Monday, Wednesday, and Friday. Use on scalp 3 times per week    . docusate sodium (COLACE) 100 MG capsule Take 1 tablet by mouth daily.    Marland Kitchen FIBER PO Take 1 capsule by mouth daily.    . furosemide (LASIX) 20 MG tablet TAKE 1 TABLET BY MOUTH TWICE DAILY 180 tablet 1  . Multiple Vitamins-Calcium (ONE-A-DAY WOMENS FORMULA PO) Take 1 tablet by mouth daily.      . mupirocin ointment (BACTROBAN) 2 % Place 1 application into the nose 2 (two) times daily. (Patient not taking: Reported on 06/21/2018) 22 g 1  . oxybutynin (DITROPAN) 5 MG tablet Take 5 mg by mouth every morning.      . potassium chloride SA (K-DUR) 20 MEQ tablet TAKE 2 TABLETS BY MOUTH TWICE DAILY 360 tablet 1  . Probiotic Product (DIGESTIVE ADVANTAGE) CAPS Take 1 tablet by mouth daily.    . rosuvastatin (CRESTOR) 5 MG tablet Take 1 tablet (5 mg total) by mouth daily. 90 tablet 3   No facility-administered medications prior to visit.     Allergies  Allergen Reactions  . Adhesive [Tape] Other (See Comments)    Skin irritation Does not tolerate [employer], tolerates [place]    Review of Systems  Constitutional: Negative for fever and malaise/fatigue.  HENT: Negative for congestion.    Eyes: Negative for blurred vision.  Respiratory: Negative for shortness of breath.   Cardiovascular: Negative for chest pain, palpitations and leg swelling.  Gastrointestinal: Negative for abdominal pain, blood in stool and nausea.  Genitourinary: Negative for dysuria and frequency.  Musculoskeletal: Negative for falls.  Skin: Negative for rash.  Neurological: Negative for dizziness, loss of consciousness and headaches.  Endo/Heme/Allergies: Negative for environmental allergies.  Psychiatric/Behavioral: Negative for depression. The patient is not nervous/anxious.        Objective:    Physical Exam Constitutional:      Appearance: Normal appearance. She is not ill-appearing.  HENT:     Nose: Nose normal.  Eyes:     General:        Right eye: No discharge.        Left eye: No discharge.  Pulmonary:     Effort: Pulmonary effort is normal.  Neurological:     Mental Status: She is alert and oriented to person, place, and time.  Psychiatric:        Mood and Affect: Mood normal.        Behavior: Behavior normal.     BP 118/69   Pulse 62   Temp (!) 96.4 F (35.8 C)  Wt Readings from Last 3 Encounters:  06/25/18 230 lb (104.3 kg)  06/21/18 230 lb (104.3 kg)  02/19/18 225 lb (102.1 kg)    Diabetic Foot Exam - Simple   No data filed     Lab Results  Component Value Date   WBC 6.4 06/21/2018   HGB 13.6 06/21/2018  HCT 42.9 06/21/2018   PLT 198 06/21/2018   GLUCOSE 105 (H) 06/21/2018   CHOL 187 06/25/2018   TRIG 210.0 (H) 06/25/2018   HDL 39.60 06/25/2018   LDLDIRECT 125.0 06/25/2018   LDLCALC 110 (H) 02/19/2018   ALT 32 06/25/2018   AST 21 06/25/2018   NA 138 06/21/2018   K 3.9 06/21/2018   CL 106 06/21/2018   CREATININE 0.49 06/21/2018   BUN 10 06/21/2018   CO2 26 06/21/2018   TSH 2.93 06/25/2018   HGBA1C 6.2 06/25/2018    Lab Results  Component Value Date   TSH 2.93 06/25/2018   Lab Results  Component Value Date   WBC 6.4 06/21/2018   HGB 13.6  06/21/2018   HCT 42.9 06/21/2018   MCV 85.6 06/21/2018   PLT 198 06/21/2018   Lab Results  Component Value Date   NA 138 06/21/2018   K 3.9 06/21/2018   CO2 26 06/21/2018   GLUCOSE 105 (H) 06/21/2018   BUN 10 06/21/2018   CREATININE 0.49 06/21/2018   BILITOT 0.4 06/25/2018   ALKPHOS 86 06/25/2018   AST 21 06/25/2018   ALT 32 06/25/2018   PROT 7.2 06/25/2018   ALBUMIN 4.0 06/25/2018   CALCIUM 8.6 (L) 06/21/2018   ANIONGAP 6 06/21/2018   GFR 134.58 02/19/2018   Lab Results  Component Value Date   CHOL 187 06/25/2018   Lab Results  Component Value Date   HDL 39.60 06/25/2018   Lab Results  Component Value Date   LDLCALC 110 (H) 02/19/2018   Lab Results  Component Value Date   TRIG 210.0 (H) 06/25/2018   Lab Results  Component Value Date   CHOLHDL 5 06/25/2018   Lab Results  Component Value Date   HGBA1C 6.2 06/25/2018       Assessment & Plan:   Problem List Items Addressed This Visit    Essential hypertension    Well controlled, no changes to meds. Encouraged heart healthy diet such as the DASH diet and exercise as tolerated. 148/89 today while she was stressed getting on the video visit. Yesterday at rest she was 118/69.      Hyperglycemia    They are eating home now and eating better.      Constipation    Doing well with colostomy tube.       Depression    Doing well sheltering in place with her sister and care providers.       Kidney stone    Has been struggling with increased kidney stones this year and notes the only change was a calcium/vitamin D tab addition so we will stop this and add lemon water daily discuss with urology if persists.          I am having [name] maintain her Multiple Vitamins-Calcium (ONE-A-DAY WOMENS FORMULA PO), oxybutynin, clobetasol, Digestive Advantage, docusate sodium, mupirocin ointment, FIBER PO, rosuvastatin, baclofen, furosemide, and potassium chloride SA.  No orders of the defined types were placed  in this encounter.  I discussed the assessment and treatment plan with the patient. The patient was provided an opportunity to ask questions and all were answered. The patient agreed with the plan and demonstrated an understanding of the instructions.   The patient was advised to call back or seek an in-person evaluation if the symptoms worsen or if the condition fails to improve as anticipated.  I provided 25 minutes of non-face-to-face time during this encounter.   Penni Homans, MD

## 2018-10-22 NOTE — Assessment & Plan Note (Signed)
Doing well with colostomy tube.

## 2018-10-22 NOTE — Assessment & Plan Note (Signed)
Has been struggling with increased kidney stones this year and notes the only change was a calcium/vitamin D tab addition so we will stop this and add lemon water daily discuss with urology if persists.

## 2018-10-22 NOTE — Assessment & Plan Note (Signed)
Well controlled, no changes to meds. Encouraged heart healthy diet such as the DASH diet and exercise as tolerated. 148/89 today while she was stressed getting on the video visit. Yesterday at rest she was 118/69.

## 2018-10-22 NOTE — Assessment & Plan Note (Signed)
They are eating home now and eating better.

## 2018-10-22 NOTE — Assessment & Plan Note (Signed)
Doing well sheltering in place with her sister and care providers.

## 2019-02-14 ENCOUNTER — Other Ambulatory Visit: Payer: Self-pay | Admitting: Family Medicine

## 2019-02-17 ENCOUNTER — Encounter: Payer: Self-pay | Admitting: Family Medicine

## 2019-02-21 ENCOUNTER — Telehealth: Payer: Self-pay | Admitting: General Practice

## 2019-02-21 NOTE — Telephone Encounter (Signed)
Can we check and see if pt can be scheduled?  Copied from [facility] [id number]. Topic: General - Other >> Feb 15, 2019  2:17 PM Leward Quan A wrote: Reason for CRM: Patient called to say that she will be having surgery on her shoulder and need to get surgical clearance from Dr Charlett Blake she is asking for a call back to set an appointment but need to know if it has to be in person or not. Patient states that answer can be sent through My Chart also. Ph# [phone] >> Feb 19, 2019  2:41 PM Scherrie Gerlach wrote: Pt called to schedule surgical clearance.  First available 10/12 at 2:40. Unless Dr Charlett Blake can work her in sooner appt

## 2019-02-21 NOTE — Telephone Encounter (Signed)
Moved appt to 9/21 2:20pm VV

## 2019-03-04 ENCOUNTER — Encounter: Payer: Self-pay | Admitting: Family Medicine

## 2019-03-04 ENCOUNTER — Other Ambulatory Visit: Payer: Self-pay

## 2019-03-04 ENCOUNTER — Ambulatory Visit (INDEPENDENT_AMBULATORY_CARE_PROVIDER_SITE_OTHER): Payer: Medicare Other | Admitting: Family Medicine

## 2019-03-04 VITALS — BP 135/86 | HR 57

## 2019-03-04 DIAGNOSIS — G8254 Quadriplegia, C5-C7 incomplete: Secondary | ICD-10-CM

## 2019-03-04 DIAGNOSIS — Z933 Colostomy status: Secondary | ICD-10-CM

## 2019-03-04 DIAGNOSIS — I10 Essential (primary) hypertension: Secondary | ICD-10-CM | POA: Diagnosis not present

## 2019-03-04 DIAGNOSIS — E782 Mixed hyperlipidemia: Secondary | ICD-10-CM

## 2019-03-04 DIAGNOSIS — Z23 Encounter for immunization: Secondary | ICD-10-CM

## 2019-03-04 DIAGNOSIS — R739 Hyperglycemia, unspecified: Secondary | ICD-10-CM

## 2019-03-04 MED ORDER — FUROSEMIDE 20 MG PO TABS: 20.0000 mg | ORAL_TABLET | Freq: Two times a day (BID) | ORAL | 1 refills | Status: DC

## 2019-03-04 MED ORDER — OXYBUTYNIN CHLORIDE 5 MG PO TABS: 5.0000 mg | ORAL_TABLET | ORAL | 1 refills | Status: DC

## 2019-03-04 MED ORDER — ROSUVASTATIN CALCIUM 5 MG PO TABS: 5.0000 mg | ORAL_TABLET | Freq: Every day | ORAL | 3 refills | Status: DC

## 2019-03-04 MED ORDER — OXYBUTYNIN CHLORIDE 5 MG PO TABS: 5.0000 mg | ORAL_TABLET | ORAL | 3 refills | Status: DC

## 2019-03-04 MED ORDER — POTASSIUM CHLORIDE CRYS ER 20 MEQ PO TBCR: 40.0000 meq | EXTENDED_RELEASE_TABLET | Freq: Two times a day (BID) | ORAL | 1 refills | Status: DC

## 2019-03-06 ENCOUNTER — Encounter: Payer: Self-pay | Admitting: Family Medicine

## 2019-03-06 NOTE — Assessment & Plan Note (Signed)
Encouraged to check vitals weekly no changes to meds. Encouraged heart healthy diet such as the DASH diet and exercise as tolerated.  

## 2019-03-06 NOTE — Progress Notes (Signed)
Virtual Visit via Video Note  I connected with [name] on 9/21/2o at  2:20 PM EDT by a video enabled telemedicine application and verified that I am speaking with the correct person using two identifiers.  Location: Patient: home Provider: office   I discussed the limitations of evaluation and management by telemedicine and the availability of in person appointments. The patient expressed understanding and agreed to proceed. [name], CMA was able to get the patient set up on a video visit.    Subjective:    Patient ID: [name], female    DOB: 10-Sep-1950, 68 y.o.   MRN: [id number]  No chief complaint on file.   HPI Patient is in today for pre operative evaluation in preparation of her right shoulder replacement with Dr Prudy Feeler at [facility] on October 14. She is ready for the surgery due to her level of pain and debility. No recent febrile illness or hospitalizations. No polyuria or polydipsia. Denies CP/palp/SOB/HA/congestion/fevers/GI or GU c/o. Taking meds as prescribed  Past Medical History:  Diagnosis Date  . Acute bronchitis 05/16/2015  . Anemia    after shoulder replacement  . Autonomic dysreflexia   . AVM (arteriovenous malformation) spine 1981  . Bowel dysfunction    constipation and fecal retention  . Cerumen impaction 10/11/2015  . Complication of anesthesia    low temperature after general anesthesia for colostomy  . Constipation 01/25/2016  . Contusion 05/18/2014   Of foot  . Depression 02/13/2017   History of   . Headache 04/09/2017  . History of kidney stones   . History of neurogenic bowel   . History of rotator cuff tear    Right  . History of ventral hernia   . Hyperglycemia 05/18/2015  . Hyperlipidemia   . Hypertension    No medications at this time  . Incomplete quadriplegia at C5-6 level ([place]) 07/24/2008   Qualifier: Diagnosis of  By: Wynona Luna Requires bowel regimen including enemas and indwelling foley cath Has had  tendency towards pressure sores in past. Had a tear with a transfer in Hospital in past for surgery and developed a sore at that time.   . Low blood pressure reading    Occassionally  . Medicare annual wellness visit, subsequent 11/02/2014   Follows with [facility] Declines colonoscopy  No h/o abnormal pap, declines further paps  . MVP (mitral valve prolapse) 12/01/2012   Mild  . Neurogenic bladder   . OA (osteoarthritis)    Hip and shoulders, neck  . Obesity 11/01/2016  . OSA on CPAP 01/31/2016  . Pain in joint, shoulder region 12/01/2012   Long history B/l Follows with Dr Hassell Done at [facility]  . Postmenopausal bleeding 12/17/2012  . Preventative health care 06/26/2013  . Quadriplegia ([place]) 1981   Secondary to ruptured AVM C5-C6  . Sacral pressure ulcer 12/01/2012   H/o Follows with Dr Tomi Likens of Dermatology  . Skin cancer 11/01/2016   2 [facility] RLE excised by Dr Wilhemina Bonito 2018 [place] LLE excised by Dr Wilhemina Bonito 2018  . Vaginitis and vulvovaginitis 12/17/2012  . Wears glasses     Past Surgical History:  Procedure Laterality Date  . ANAL TAG REMOVAL  2014  . BASAL CELL CARCINOMA EXCISION  2016   Lip  . BASAL CELL CARCINOMA EXCISION  2018   right leg x2  . BASAL CELL CARCINOMA EXCISION Left 2018   leg, chest  . BASAL CELL CARCINOMA EXCISION  Right 2019   leg  . BLADDER STONE REMOVAL  1986/87  . COLONOSCOPY  02/22/2016  . COLOSTOMY  02/22/2016  . CYSTOSCOPY WITH BIOPSY N/A 06/21/2018   Procedure: CYSTOSCOPY WITH BIOPSY FULGURATION;  Surgeon: Irine Seal, MD;  Location: [facility];  Service: Urology;  Laterality: N/A;  . CYSTOSCOPY WITH LITHOLAPAXY N/A 06/21/2018   Procedure: CYSTOSCOPY WITH LITHOLAPAXY;  Surgeon: Irine Seal, MD;  Location: [facility];  Service: Urology;  Laterality: N/A;  . DORSAL CHEILECTOMY-HALLUX RIGIDUS Bilateral 2011  . HERNIA REPAIR  0000000   VENTRAL umbilical hernia repair  . LAMINECTOMY  10/23/1979   AVM  .  LAPAROSCOPIC SIGMOID COLECTOMY  02/22/2016  . [place]   left shoulder  . ROTATOR CUFF REPAIR Right 2001  . SHOULDER ARTHROSCOPY  2007   right  . SQUAMOUS CELL CARCINOMA EXCISION Left 2018   leg  . Toe Nails Removal Bilateral 1981   both great toes  . TOTAL SHOULDER REPLACEMENT  2001   left  . TRANSFER TENDON HAND  1982, 1983   to right hand X3  . VENTRAL HERNIA REPAIR  08/17/2010   Postop ileus (Dr. Redmond Pulling)    Family History  Problem Relation Age of Onset  . Cancer Mother        breast & melanoma  . Hypertension Mother   . Heart disease Father        CAD, MI, AFib  . Cancer Sister        melanoma  . Cancer Maternal Grandfather        melanoma  . Stroke Paternal Grandmother   . Heart disease Paternal Grandfather   . Cancer Maternal Aunt        breast  . Cancer Paternal Aunt        breast  . Cancer Paternal Uncle        breast  . Cancer Paternal Aunt        breast    Social History   Socioeconomic History  . Marital status: Single    Spouse name: Not on file  . Number of children: Not on file  . Years of education: Not on file  . Highest education level: Not on file  Occupational History  . Occupation: Disabled    Comment: Music therapist  Social Needs  . Financial resource strain: Not on file  . Food insecurity    Worry: Not on file    Inability: Not on file  . Transportation needs    Medical: Not on file    Non-medical: Not on file  Tobacco Use  . Smoking status: Never Smoker  . Smokeless tobacco: Never Used  Substance and Sexual Activity  . Alcohol use: No  . Drug use: No  . Sexual activity: Not on file    Comment: lives with sister and mother, no major dietary restrictions  Lifestyle  . Physical activity    Days per week: Not on file    Minutes per session: Not on file  . Stress: Not on file  Relationships  . Social Herbalist on phone: Not on file    Gets together: Not on file    Attends religious service:  Not on file    Active member of club or organization: Not on file    Attends meetings of clubs or organizations: Not on file    Relationship status: Not on file  . Intimate partner violence  Fear of current or ex partner: Not on file    Emotionally abused: Not on file    Physically abused: Not on file    Forced sexual activity: Not on file  Other Topics Concern  . Not on file  Social History Narrative   Sister is primary caregiver    Outpatient Medications Prior to Visit  Medication Sig Dispense Refill  . baclofen (LIORESAL) 20 MG tablet TAKE 1/2 TO 1 (10-20MG S) TABLET BY MOUTH4 TIMES DAILY AS NEEDED FOR MUSCLE SPASMS 90 each 3  . clobetasol (TEMOVATE) 0.05 % external solution Apply 1 application topically every Monday, Wednesday, and Friday. Use on scalp 3 times per week    . docusate sodium (COLACE) 100 MG capsule Take 1 tablet by mouth daily.    Marland Kitchen FIBER PO Take 1 capsule by mouth daily.    . Multiple Vitamins-Calcium (ONE-A-DAY WOMENS FORMULA PO) Take 1 tablet by mouth daily.      . mupirocin ointment (BACTROBAN) 2 % Place 1 application into the nose 2 (two) times daily. (Patient not taking: Reported on 06/21/2018) 22 g 1  . Probiotic Product (DIGESTIVE ADVANTAGE) CAPS Take 1 tablet by mouth daily.    . furosemide (LASIX) 20 MG tablet TAKE 1 TABLET BY MOUTH TWICE DAILY 180 tablet 1  . oxybutynin (DITROPAN) 5 MG tablet Take 5 mg by mouth every morning.      . potassium chloride SA (K-DUR) 20 MEQ tablet TAKE 2 TABLETS BY MOUTH TWICE DAILY 360 tablet 1  . rosuvastatin (CRESTOR) 5 MG tablet Take 1 tablet (5 mg total) by mouth daily. 90 tablet 3   No facility-administered medications prior to visit.     Allergies  Allergen Reactions  . Adhesive [Tape] Other (See Comments)    Skin irritation Does not tolerate [employer], tolerates [place]    Review of Systems  Constitutional: Positive for malaise/fatigue. Negative for fever.  HENT: Negative for congestion.   Eyes: Negative for  blurred vision.  Respiratory: Negative for shortness of breath.   Cardiovascular: Negative for chest pain, palpitations and leg swelling.  Gastrointestinal: Negative for abdominal pain, blood in stool and nausea.  Genitourinary: Negative for dysuria and frequency.  Musculoskeletal: Positive for joint pain. Negative for falls.  Skin: Negative for rash.  Neurological: Negative for dizziness, loss of consciousness and headaches.  Endo/Heme/Allergies: Negative for environmental allergies.  Psychiatric/Behavioral: Negative for depression. The patient is not nervous/anxious.        Objective:    Physical Exam Constitutional:      Appearance: Normal appearance. She is not ill-appearing.  HENT:     Head: Normocephalic and atraumatic.  Eyes:     General:        Right eye: No discharge.        Left eye: No discharge.  Pulmonary:     Effort: Pulmonary effort is normal.  Neurological:     Mental Status: She is alert and oriented to person, place, and time.     Comments: In a wheelchair  Psychiatric:        Behavior: Behavior normal.     BP 135/86   Pulse 64   SpO2 96%  Wt Readings from Last 3 Encounters:  06/25/18 230 lb (104.3 kg)  06/21/18 230 lb (104.3 kg)  02/19/18 225 lb (102.1 kg)    Diabetic Foot Exam - Simple   No data filed     Lab Results  Component Value Date   WBC 6.4 06/21/2018   HGB 13.6 06/21/2018  HCT 42.9 06/21/2018   PLT 198 06/21/2018   GLUCOSE 105 (H) 06/21/2018   CHOL 187 06/25/2018   TRIG 210.0 (H) 06/25/2018   HDL 39.60 06/25/2018   LDLDIRECT 125.0 06/25/2018   LDLCALC 110 (H) 02/19/2018   ALT 32 06/25/2018   AST 21 06/25/2018   NA 138 06/21/2018   K 3.9 06/21/2018   CL 106 06/21/2018   CREATININE 0.49 06/21/2018   BUN 10 06/21/2018   CO2 26 06/21/2018   TSH 2.93 06/25/2018   HGBA1C 6.2 06/25/2018    Lab Results  Component Value Date   TSH 2.93 06/25/2018   Lab Results  Component Value Date   WBC 6.4 06/21/2018   HGB 13.6  06/21/2018   HCT 42.9 06/21/2018   MCV 85.6 06/21/2018   PLT 198 06/21/2018   Lab Results  Component Value Date   NA 138 06/21/2018   K 3.9 06/21/2018   CO2 26 06/21/2018   GLUCOSE 105 (H) 06/21/2018   BUN 10 06/21/2018   CREATININE 0.49 06/21/2018   BILITOT 0.4 06/25/2018   ALKPHOS 86 06/25/2018   AST 21 06/25/2018   ALT 32 06/25/2018   PROT 7.2 06/25/2018   ALBUMIN 4.0 06/25/2018   CALCIUM 8.6 (L) 06/21/2018   ANIONGAP 6 06/21/2018   GFR 134.58 02/19/2018   Lab Results  Component Value Date   CHOL 187 06/25/2018   Lab Results  Component Value Date   HDL 39.60 06/25/2018   Lab Results  Component Value Date   LDLCALC 110 (H) 02/19/2018   Lab Results  Component Value Date   TRIG 210.0 (H) 06/25/2018   Lab Results  Component Value Date   CHOLHDL 5 06/25/2018   Lab Results  Component Value Date   HGBA1C 6.2 06/25/2018       Assessment & Plan:   Problem List Items Addressed This Visit    Hyperlipidemia    Tolerating statin, encouraged heart healthy diet, avoid trans fats, minimize simple carbs and saturated fats. Increase exercise as tolerated      Relevant Medications   rosuvastatin (CRESTOR) 5 MG tablet   furosemide (LASIX) 20 MG tablet   Incomplete quadriplegia at C5-6 level ([place])    She continues to live at home with her sister and manages her ADLs with the help of aides and her sister      Essential hypertension    Encouraged to check vitals weekly no changes to meds. Encouraged heart healthy diet such as the DASH diet and exercise as tolerated.       Relevant Medications   rosuvastatin (CRESTOR) 5 MG tablet   furosemide (LASIX) 20 MG tablet   Hyperglycemia    hgba1c acceptable, minimize simple carbs. Increase exercise as tolerated.       Colostomy status ([place])    Continues to well with colostomy in place       Other Visit Diagnoses    Need for tetanus booster    -  Primary   Relevant Orders   Td vaccine greater than or equal to 7yo  preservative free IM      I have changed Janvi A. Turnbo's potassium chloride SA and furosemide. I am also having her maintain her Multiple Vitamins-Calcium (ONE-A-DAY WOMENS FORMULA PO), clobetasol, Digestive Advantage, docusate sodium, mupirocin ointment, FIBER PO, baclofen, rosuvastatin, and oxybutynin.  Meds ordered this encounter  Medications  . DISCONTD: oxybutynin (DITROPAN) 5 MG tablet    Sig: Take 1 tablet (5 mg total) by mouth every morning.  Dispense:  90 tablet    Refill:  3  . rosuvastatin (CRESTOR) 5 MG tablet    Sig: Take 1 tablet (5 mg total) by mouth daily.    Dispense:  90 tablet    Refill:  3  . potassium chloride SA (K-DUR) 20 MEQ tablet    Sig: Take 2 tablets (40 mEq total) by mouth 2 (two) times daily.    Dispense:  360 tablet    Refill:  1  . furosemide (LASIX) 20 MG tablet    Sig: Take 1 tablet (20 mg total) by mouth 2 (two) times daily.    Dispense:  180 tablet    Refill:  1  . oxybutynin (DITROPAN) 5 MG tablet    Sig: Take 1 tablet (5 mg total) by mouth every morning.    Dispense:  90 tablet    Refill:  1      I discussed the assessment and treatment plan with the patient. The patient was provided an opportunity to ask questions and all were answered. The patient agreed with the plan and demonstrated an understanding of the instructions.   The patient was advised to call back or seek an in-person evaluation if the symptoms worsen or if the condition fails to improve as anticipated.  I provided 25 minutes of non-face-to-face time during this encounter.   Penni Homans, MD

## 2019-03-06 NOTE — Assessment & Plan Note (Signed)
Tolerating statin, encouraged heart healthy diet, avoid trans fats, minimize simple carbs and saturated fats. Increase exercise as tolerated 

## 2019-03-06 NOTE — Assessment & Plan Note (Signed)
She is in today for pre op clearance in preparation of a surgery with Dr Prudy Feeler on her shoulder at [facility] on October 14. She is anxious to proceed with surgery and if she has no change in her condition she is medically cleared for surgery.

## 2019-03-06 NOTE — Assessment & Plan Note (Signed)
hgba1c acceptable, minimize simple carbs. Increase exercise as tolerated.  

## 2019-03-06 NOTE — Assessment & Plan Note (Signed)
She continues to live at home with her sister and manages her ADLs with the help of aides and her sister

## 2019-03-06 NOTE — Assessment & Plan Note (Signed)
Continues to well with colostomy in place

## 2019-03-13 ENCOUNTER — Telehealth: Payer: Self-pay

## 2019-03-13 NOTE — Telephone Encounter (Signed)
Copied from [place] [phone]. Topic: General - Other >> Mar 13, 2019  3:54 PM [name], [place] C wrote: Reason for CRM:  pt called in to be advised by provider. Pt says that she is having her flu shot and would like to know if she should go ahead and have her pneumonia shot as well?   Pt says that she did get her tetanus and 2nd shingles shot on - 03/06/19   CB: [phone]

## 2019-03-14 ENCOUNTER — Ambulatory Visit (INDEPENDENT_AMBULATORY_CARE_PROVIDER_SITE_OTHER): Payer: Medicare Other

## 2019-03-14 ENCOUNTER — Other Ambulatory Visit: Payer: Self-pay

## 2019-03-14 DIAGNOSIS — Z23 Encounter for immunization: Secondary | ICD-10-CM | POA: Diagnosis not present

## 2019-03-15 NOTE — Telephone Encounter (Signed)
Yes she should go ahead with her pneumonia shot

## 2019-03-15 NOTE — Telephone Encounter (Signed)
Sent patient mychart message regarding advice on injection.

## 2019-03-21 ENCOUNTER — Other Ambulatory Visit: Payer: Self-pay

## 2019-03-21 ENCOUNTER — Ambulatory Visit (INDEPENDENT_AMBULATORY_CARE_PROVIDER_SITE_OTHER): Payer: Medicare Other | Admitting: *Deleted

## 2019-03-21 DIAGNOSIS — Z23 Encounter for immunization: Secondary | ICD-10-CM

## 2019-03-21 NOTE — Progress Notes (Signed)
Patient here for pneumonia vaccine.  Pneumonia vaccine given in left deltoid and patient tolerated well.

## 2019-03-25 ENCOUNTER — Ambulatory Visit: Payer: Medicare Other | Admitting: Family Medicine

## 2019-03-25 ENCOUNTER — Other Ambulatory Visit: Payer: Self-pay | Admitting: Family Medicine

## 2019-04-04 ENCOUNTER — Encounter: Payer: Self-pay | Admitting: Family Medicine

## 2019-04-08 NOTE — Telephone Encounter (Signed)
Spoke with patient and set her up for Virtual visit on 04/09/2019

## 2019-04-09 ENCOUNTER — Ambulatory Visit (INDEPENDENT_AMBULATORY_CARE_PROVIDER_SITE_OTHER): Payer: Medicare Other | Admitting: Family Medicine

## 2019-04-09 ENCOUNTER — Other Ambulatory Visit: Payer: Self-pay

## 2019-04-09 DIAGNOSIS — E782 Mixed hyperlipidemia: Secondary | ICD-10-CM | POA: Diagnosis not present

## 2019-04-09 DIAGNOSIS — I10 Essential (primary) hypertension: Secondary | ICD-10-CM

## 2019-04-09 DIAGNOSIS — D649 Anemia, unspecified: Secondary | ICD-10-CM

## 2019-04-09 DIAGNOSIS — R300 Dysuria: Secondary | ICD-10-CM

## 2019-04-09 DIAGNOSIS — M25511 Pain in right shoulder: Secondary | ICD-10-CM

## 2019-04-09 DIAGNOSIS — R739 Hyperglycemia, unspecified: Secondary | ICD-10-CM | POA: Diagnosis not present

## 2019-04-09 DIAGNOSIS — G8929 Other chronic pain: Secondary | ICD-10-CM

## 2019-04-10 NOTE — Progress Notes (Signed)
Virtual Visit via Video Note  I connected with [name] on 04/09/19 at  2:40 PM EDT by a video enabled telemedicine application and verified that I am speaking with the correct person using two identifiers.  Location: Patient: home Provider: office   I discussed the limitations of evaluation and management by telemedicine and the availability of in person appointments. The patient expressed understanding and agreed to proceed. [name] CMA was able to get her set up on a video visit.    Subjective:    Patient ID: [name], female    DOB: Oct 20, 1950, 68 y.o.   MRN: [id number]  No chief complaint on file.   HPI Patient is in today for follow up on chronic medical concerns including hyperglycemia, hyperlipidemia, shoulder pain and more. She underwent surgery on her right shoulder on 03/27/19. No concerns regarding pain and she is healing well. No recent febrile illness or other acute concerns. Denies CP/palp/SOB/HA/congestion/fevers/GI or GU c/o. Taking meds as prescribed  Past Medical History:  Diagnosis Date  . Acute bronchitis 05/16/2015  . Anemia    after shoulder replacement  . Autonomic dysreflexia   . AVM (arteriovenous malformation) spine 1981  . Bowel dysfunction    constipation and fecal retention  . Cerumen impaction 10/11/2015  . Complication of anesthesia    low temperature after general anesthesia for colostomy  . Constipation 01/25/2016  . Contusion 05/18/2014   Of foot  . Depression 02/13/2017   History of   . Headache 04/09/2017  . History of kidney stones   . History of neurogenic bowel   . History of rotator cuff tear    Right  . History of ventral hernia   . Hyperglycemia 05/18/2015  . Hyperlipidemia   . Hypertension    No medications at this time  . Incomplete quadriplegia at C5-6 level ([place]) 07/24/2008   Qualifier: Diagnosis of  By: Wynona Luna Requires bowel regimen including enemas and indwelling foley cath Has had tendency towards  pressure sores in past. Had a tear with a transfer in Hospital in past for surgery and developed a sore at that time.   . Low blood pressure reading    Occassionally  . Medicare annual wellness visit, subsequent 11/02/2014   Follows with [facility] Declines colonoscopy  No h/o abnormal pap, declines further paps  . MVP (mitral valve prolapse) 12/01/2012   Mild  . Neurogenic bladder   . OA (osteoarthritis)    Hip and shoulders, neck  . Obesity 11/01/2016  . OSA on CPAP 01/31/2016  . Pain in joint, shoulder region 12/01/2012   Long history B/l Follows with Dr Hassell [facility]  . Postmenopausal bleeding 12/17/2012  . Preventative health care 06/26/2013  . Quadriplegia ([place]) 1981   Secondary to ruptured AVM C5-C6  . Sacral pressure ulcer 12/01/2012   H/o Follows with Dr Tomi Likens of Dermatology  . Skin cancer 11/01/2016   2 [facility] RLE excised by Dr Wilhemina Bonito 2018 [place] LLE excised by Dr Wilhemina Bonito 2018  . Vaginitis and vulvovaginitis 12/17/2012  . Wears glasses     Past Surgical History:  Procedure Laterality Date  . ANAL TAG REMOVAL  2014  . BASAL CELL CARCINOMA EXCISION  2016   Lip  . BASAL CELL CARCINOMA EXCISION  2018   right leg x2  . BASAL CELL CARCINOMA EXCISION Left 2018   leg, chest  . BASAL CELL CARCINOMA EXCISION Right 2019   leg  .  BLADDER STONE REMOVAL  1986/87  . COLONOSCOPY  02/22/2016  . COLOSTOMY  02/22/2016  . CYSTOSCOPY WITH BIOPSY N/A 06/21/2018   Procedure: CYSTOSCOPY WITH BIOPSY FULGURATION;  Surgeon: Irine Seal, MD;  Location: [facility];  Service: Urology;  Laterality: N/A;  . CYSTOSCOPY WITH LITHOLAPAXY N/A 06/21/2018   Procedure: CYSTOSCOPY WITH LITHOLAPAXY;  Surgeon: Irine Seal, MD;  Location: [facility];  Service: Urology;  Laterality: N/A;  . DORSAL CHEILECTOMY-HALLUX RIGIDUS Bilateral 2011  . HERNIA REPAIR  0000000   VENTRAL umbilical hernia repair  . LAMINECTOMY  10/23/1979   AVM  . LAPAROSCOPIC SIGMOID  COLECTOMY  02/22/2016  . [place]   left shoulder  . ROTATOR CUFF REPAIR Right 2001  . SHOULDER ARTHROSCOPY  2007   right  . SQUAMOUS CELL CARCINOMA EXCISION Left 2018   leg  . Toe Nails Removal Bilateral 1981   both great toes  . TOTAL SHOULDER REPLACEMENT  2001   left  . TRANSFER TENDON HAND  1982, 1983   to right hand X3  . VENTRAL HERNIA REPAIR  08/17/2010   Postop ileus (Dr. Redmond Pulling)    Family History  Problem Relation Age of Onset  . Cancer Mother        breast & melanoma  . Hypertension Mother   . Heart disease Father        CAD, MI, AFib  . Cancer Sister        melanoma  . Cancer Maternal Grandfather        melanoma  . Stroke Paternal Grandmother   . Heart disease Paternal Grandfather   . Cancer Maternal Aunt        breast  . Cancer Paternal Aunt        breast  . Cancer Paternal Uncle        breast  . Cancer Paternal Aunt        breast    Social History   Socioeconomic History  . Marital status: Single    Spouse name: Not on file  . Number of children: Not on file  . Years of education: Not on file  . Highest education level: Not on file  Occupational History  . Occupation: Disabled    Comment: Music therapist  Social Needs  . Financial resource strain: Not on file  . Food insecurity    Worry: Not on file    Inability: Not on file  . Transportation needs    Medical: Not on file    Non-medical: Not on file  Tobacco Use  . Smoking status: Never Smoker  . Smokeless tobacco: Never Used  Substance and Sexual Activity  . Alcohol use: No  . Drug use: No  . Sexual activity: Not on file    Comment: lives with sister and mother, no major dietary restrictions  Lifestyle  . Physical activity    Days per week: Not on file    Minutes per session: Not on file  . Stress: Not on file  Relationships  . Social Herbalist on phone: Not on file    Gets together: Not on file    Attends religious service: Not on file     Active member of club or organization: Not on file    Attends meetings of clubs or organizations: Not on file    Relationship status: Not on file  . Intimate partner violence    Fear of current or ex partner: Not  on file    Emotionally abused: Not on file    Physically abused: Not on file    Forced sexual activity: Not on file  Other Topics Concern  . Not on file  Social History Narrative   Sister is primary caregiver    Outpatient Medications Prior to Visit  Medication Sig Dispense Refill  . baclofen (LIORESAL) 20 MG tablet TAKE 1/2 TO 1 (10-20MG S) TABLET BY MOUTH4 TIMES DAILY AS NEEDED FOR MUSCLE SPASMS 90 each 3  . clobetasol (TEMOVATE) 0.05 % external solution Apply 1 application topically every Monday, Wednesday, and Friday. Use on scalp 3 times per week    . docusate sodium (COLACE) 100 MG capsule Take 1 tablet by mouth daily.    Marland Kitchen FIBER PO Take 1 capsule by mouth daily.    . furosemide (LASIX) 20 MG tablet Take 1 tablet (20 mg total) by mouth 2 (two) times daily. 180 tablet 1  . Multiple Vitamins-Calcium (ONE-A-DAY WOMENS FORMULA PO) Take 1 tablet by mouth daily.      . mupirocin ointment (BACTROBAN) 2 % Place 1 application into the nose 2 (two) times daily. (Patient not taking: Reported on 06/21/2018) 22 g 1  . oxybutynin (DITROPAN) 5 MG tablet Take 1 tablet (5 mg total) by mouth every morning. 90 tablet 1  . potassium chloride SA (K-DUR) 20 MEQ tablet Take 2 tablets (40 mEq total) by mouth 2 (two) times daily. 360 tablet 1  . Probiotic Product (DIGESTIVE ADVANTAGE) CAPS Take 1 tablet by mouth daily.    . rosuvastatin (CRESTOR) 5 MG tablet Take 1 tablet (5 mg total) by mouth daily. 90 tablet 3   No facility-administered medications prior to visit.     Allergies  Allergen Reactions  . Adhesive [Tape] Other (See Comments)    Skin irritation Does not tolerate [employer], tolerates [place]    Review of Systems  Constitutional: Negative for fever and malaise/fatigue.  HENT:  Negative for congestion.   Eyes: Negative for blurred vision.  Respiratory: Negative for shortness of breath.   Cardiovascular: Negative for chest pain, palpitations and leg swelling.  Gastrointestinal: Negative for abdominal pain, blood in stool and nausea.  Genitourinary: Negative for dysuria and frequency.  Musculoskeletal: Positive for joint pain and myalgias. Negative for falls.  Skin: Negative for rash.  Neurological: Negative for dizziness, loss of consciousness and headaches.  Endo/Heme/Allergies: Negative for environmental allergies.  Psychiatric/Behavioral: Negative for depression. The patient is not nervous/anxious.        Objective:    Physical Exam Constitutional:      Appearance: Normal appearance. She is not ill-appearing.  HENT:     Head: Normocephalic and atraumatic.     Nose: Nose normal.  Eyes:     General:        Right eye: No discharge.        Left eye: No discharge.  Pulmonary:     Effort: Pulmonary effort is normal.  Musculoskeletal:     Comments: Right shoulder is in a sling  Neurological:     Mental Status: She is oriented to person, place, and time.  Psychiatric:        Mood and Affect: Mood normal.        Behavior: Behavior normal.     There were no vitals taken for this visit. Wt Readings from Last 3 Encounters:  06/25/18 230 lb (104.3 kg)  06/21/18 230 lb (104.3 kg)  02/19/18 225 lb (102.1 kg)    Diabetic Foot Exam - Simple  No data filed     Lab Results  Component Value Date   WBC 6.4 06/21/2018   HGB 13.6 06/21/2018   HCT 42.9 06/21/2018   PLT 198 06/21/2018   GLUCOSE 105 (H) 06/21/2018   CHOL 187 06/25/2018   TRIG 210.0 (H) 06/25/2018   HDL 39.60 06/25/2018   LDLDIRECT 125.0 06/25/2018   LDLCALC 110 (H) 02/19/2018   ALT 32 06/25/2018   AST 21 06/25/2018   NA 138 06/21/2018   K 3.9 06/21/2018   CL 106 06/21/2018   CREATININE 0.49 06/21/2018   BUN 10 06/21/2018   CO2 26 06/21/2018   TSH 2.93 06/25/2018   HGBA1C 6.2  06/25/2018    Lab Results  Component Value Date   TSH 2.93 06/25/2018   Lab Results  Component Value Date   WBC 6.4 06/21/2018   HGB 13.6 06/21/2018   HCT 42.9 06/21/2018   MCV 85.6 06/21/2018   PLT 198 06/21/2018   Lab Results  Component Value Date   NA 138 06/21/2018   K 3.9 06/21/2018   CO2 26 06/21/2018   GLUCOSE 105 (H) 06/21/2018   BUN 10 06/21/2018   CREATININE 0.49 06/21/2018   BILITOT 0.4 06/25/2018   ALKPHOS 86 06/25/2018   AST 21 06/25/2018   ALT 32 06/25/2018   PROT 7.2 06/25/2018   ALBUMIN 4.0 06/25/2018   CALCIUM 8.6 (L) 06/21/2018   ANIONGAP 6 06/21/2018   GFR 134.58 02/19/2018   Lab Results  Component Value Date   CHOL 187 06/25/2018   Lab Results  Component Value Date   HDL 39.60 06/25/2018   Lab Results  Component Value Date   LDLCALC 110 (H) 02/19/2018   Lab Results  Component Value Date   TRIG 210.0 (H) 06/25/2018   Lab Results  Component Value Date   CHOLHDL 5 06/25/2018   Lab Results  Component Value Date   HGBA1C 6.2 06/25/2018       Assessment & Plan:   Problem List Items Addressed This Visit    Hyperlipidemia   Relevant Orders   Lipid panel   Anemia    Check cbc, Increase leafy greens, consider increased lean red meat and using cast iron cookware. Continue to monitor, report any concerns      Relevant Orders   Ferritin   Essential hypertension    Monitor vitals weekly and report any concerns. no changes to meds. Encouraged heart healthy diet such as the DASH diet and exercise as tolerated.       Relevant Orders   CBC   Comprehensive metabolic panel   TSH   Right shoulder pain    She is recovering from shoulder surgery well which was performed on 10/14. She has no acute concerns.       Hyperglycemia - Primary    hgba1c acceptable, minimize simple carbs. Increase exercise as tolerated.      Relevant Orders   Hemoglobin A1c    Other Visit Diagnoses    Dysuria       Relevant Orders   Urine Culture    Urinalysis      I am having Nahiara A. Ramsier maintain her Multiple Vitamins-Calcium (ONE-A-DAY WOMENS FORMULA PO), clobetasol, Digestive Advantage, docusate sodium, mupirocin ointment, FIBER PO, baclofen, rosuvastatin, potassium chloride SA, furosemide, and oxybutynin.  No orders of the defined types were placed in this encounter.   I discussed the assessment and treatment plan with the patient. The patient was provided an opportunity to ask questions and all were  answered. The patient agreed with the plan and demonstrated an understanding of the instructions.   The patient was advised to call back or seek an in-person evaluation if the symptoms worsen or if the condition fails to improve as anticipated.  I provided 25 minutes of non-face-to-face time during this encounter.   Penni Homans, MD

## 2019-04-10 NOTE — Assessment & Plan Note (Addendum)
She is recovering from shoulder surgery well which was performed on 10/14. She has no acute concerns.

## 2019-04-10 NOTE — Assessment & Plan Note (Signed)
Monitor vitals weekly and report any concerns. no changes to meds. Encouraged heart healthy diet such as the DASH diet and exercise as tolerated.  

## 2019-04-10 NOTE — Assessment & Plan Note (Signed)
hgba1c acceptable, minimize simple carbs. Increase exercise as tolerated.  

## 2019-04-10 NOTE — Assessment & Plan Note (Signed)
Check cbc, Increase leafy greens, consider increased lean red meat and using cast iron cookware. Continue to monitor, report any concerns

## 2019-05-13 ENCOUNTER — Other Ambulatory Visit: Payer: Self-pay

## 2019-05-13 ENCOUNTER — Other Ambulatory Visit (INDEPENDENT_AMBULATORY_CARE_PROVIDER_SITE_OTHER): Payer: Medicare Other

## 2019-05-13 DIAGNOSIS — R739 Hyperglycemia, unspecified: Secondary | ICD-10-CM

## 2019-05-13 DIAGNOSIS — R300 Dysuria: Secondary | ICD-10-CM

## 2019-05-13 DIAGNOSIS — D649 Anemia, unspecified: Secondary | ICD-10-CM | POA: Diagnosis not present

## 2019-05-13 DIAGNOSIS — I10 Essential (primary) hypertension: Secondary | ICD-10-CM | POA: Diagnosis not present

## 2019-05-13 DIAGNOSIS — E782 Mixed hyperlipidemia: Secondary | ICD-10-CM | POA: Diagnosis not present

## 2019-05-13 LAB — CBC
HCT: 38.4 % (ref 36.0–46.0)
Hemoglobin: 12.5 g/dL (ref 12.0–15.0)
MCHC: 32.5 g/dL (ref 30.0–36.0)
MCV: 79.6 fl (ref 78.0–100.0)
Platelets: 284 10*3/uL (ref 150.0–400.0)
RBC: 4.83 Mil/uL (ref 3.87–5.11)
RDW: 15.9 % — ABNORMAL HIGH (ref 11.5–15.5)
WBC: 7.4 10*3/uL (ref 4.0–10.5)

## 2019-05-13 LAB — LIPID PANEL
Cholesterol: 144 mg/dL (ref 0–200)
HDL: 37.2 mg/dL — ABNORMAL LOW (ref >39.00)
LDL Cholesterol: 79 mg/dL (ref 0–99)
NonHDL: 106.42
Total CHOL/HDL Ratio: 4
Triglycerides: 137 mg/dL (ref 0.0–149.0)
VLDL: 27.4 mg/dL (ref 0.0–40.0)

## 2019-05-13 LAB — COMPREHENSIVE METABOLIC PANEL
ALT: 36 U/L — ABNORMAL HIGH (ref 0–35)
AST: 34 U/L (ref 0–37)
Albumin: 3.6 g/dL (ref 3.5–5.2)
Alkaline Phosphatase: 106 U/L (ref 39–117)
BUN: 5 mg/dL — ABNORMAL LOW (ref 6–23)
CO2: 27 mEq/L (ref 19–32)
Calcium: 9.3 mg/dL (ref 8.4–10.5)
Chloride: 102 mEq/L (ref 96–112)
Creatinine, Ser: 0.38 mg/dL — ABNORMAL LOW (ref 0.40–1.20)
GFR: 169.16 mL/min (ref >60.00)
Glucose, Bld: 103 mg/dL — ABNORMAL HIGH (ref 70–99)
Potassium: 3.8 mEq/L (ref 3.5–5.1)
Sodium: 138 mEq/L (ref 135–145)
Total Bilirubin: 0.5 mg/dL (ref 0.2–1.2)
Total Protein: 7 g/dL (ref 6.0–8.3)

## 2019-05-13 LAB — FERRITIN: Ferritin: 45.2 ng/mL (ref 10.0–291.0)

## 2019-05-13 LAB — HEMOGLOBIN A1C: Hgb A1c MFr Bld: 5.9 % (ref 4.6–6.5)

## 2019-05-13 LAB — TSH: TSH: 4.13 u[IU]/mL (ref 0.35–4.50)

## 2019-05-14 LAB — URINALYSIS, ROUTINE W REFLEX MICROSCOPIC
Bilirubin Urine: NEGATIVE
Hgb urine dipstick: NEGATIVE
Ketones, ur: NEGATIVE
Nitrite: NEGATIVE
RBC / HPF: NONE SEEN (ref 0–?)
Specific Gravity, Urine: <=1.005 — AB (ref 1.000–1.030)
Total Protein, Urine: NEGATIVE
Urine Glucose: NEGATIVE
Urobilinogen, UA: 0.2 (ref 0.0–1.0)
pH: 7 (ref 5.0–8.0)

## 2019-05-14 NOTE — Addendum Note (Signed)
Addended by: Caffie Pinto on: 05/14/2019 10:34 AM   Modules accepted: Orders

## 2019-05-16 LAB — URINE CULTURE
MICRO NUMBER:: 1150643
SPECIMEN QUALITY:: ADEQUATE

## 2019-06-18 ENCOUNTER — Encounter: Payer: Self-pay | Admitting: Family Medicine

## 2019-06-18 ENCOUNTER — Other Ambulatory Visit: Payer: Self-pay

## 2019-06-18 ENCOUNTER — Ambulatory Visit (INDEPENDENT_AMBULATORY_CARE_PROVIDER_SITE_OTHER): Payer: Medicare PPO | Admitting: Family Medicine

## 2019-06-18 DIAGNOSIS — N319 Neuromuscular dysfunction of bladder, unspecified: Secondary | ICD-10-CM

## 2019-06-18 DIAGNOSIS — M25551 Pain in right hip: Secondary | ICD-10-CM | POA: Diagnosis not present

## 2019-06-18 DIAGNOSIS — I10 Essential (primary) hypertension: Secondary | ICD-10-CM

## 2019-06-18 DIAGNOSIS — R739 Hyperglycemia, unspecified: Secondary | ICD-10-CM

## 2019-06-18 DIAGNOSIS — G8254 Quadriplegia, C5-C7 incomplete: Secondary | ICD-10-CM | POA: Diagnosis not present

## 2019-06-18 DIAGNOSIS — Z933 Colostomy status: Secondary | ICD-10-CM | POA: Diagnosis not present

## 2019-06-18 DIAGNOSIS — E782 Mixed hyperlipidemia: Secondary | ICD-10-CM | POA: Diagnosis not present

## 2019-06-18 MED ORDER — MUPIROCIN 2 % EX OINT: 1.0000 "application " | TOPICAL_OINTMENT | Freq: Two times a day (BID) | CUTANEOUS | 1 refills | Status: DC

## 2019-06-18 MED ORDER — HYDROCHLOROTHIAZIDE 12.5 MG PO CAPS: 12.5000 mg | ORAL_CAPSULE | Freq: Every day | ORAL | 3 refills | Status: DC

## 2019-06-18 MED ORDER — ROSUVASTATIN CALCIUM 5 MG PO TABS: 5.0000 mg | ORAL_TABLET | Freq: Every day | ORAL | 3 refills | Status: DC

## 2019-06-18 MED ORDER — BACLOFEN 20 MG PO TABS: ORAL_TABLET | ORAL | 3 refills | Status: DC

## 2019-06-18 MED ORDER — CEFDINIR 300 MG PO CAPS: 300.0000 mg | ORAL_CAPSULE | Freq: Two times a day (BID) | ORAL | 0 refills | Status: AC

## 2019-06-24 NOTE — Assessment & Plan Note (Signed)
hgba1c acceptable, minimize simple carbs. Increase exercise as tolerated.  

## 2019-06-24 NOTE — Progress Notes (Signed)
Virtual Visit via Video Note  I connected with [name] on 06/18/19 at  3:20 PM EST by a video enabled telemedicine application and verified that I am speaking with the correct person using two identifiers.  Location: Patient: home Provider: home   I discussed the limitations of evaluation and management by telemedicine and the availability of in person appointments. The patient expressed understanding and agreed to proceed. Magdalene Molly, CMA was able to get her set up on a visit, video   Subjective:    Patient ID: [name], female    DOB: [birth year], 69 y.o.   MRN: [id number]  No chief complaint on file.   HPI Patient is in today for follow up on chronic medical concerns including hypertension, hyperglycemia and more. No recent febrile illness or hospitalizations. She is noting recent overflow of urine from her catheter and some dysuria. She continues to struggle with spasm and joing pain, gets some relief from Baclofen. No acute concerns other than her blood pressure has been elevated recently. Denies CP/palp/SOB/HA/congestion/fevers/GI c/o. Taking meds as prescribed  Past Medical History:  Diagnosis Date  . Acute bronchitis 05/16/2015  . Anemia    after shoulder replacement  . Autonomic dysreflexia   . AVM (arteriovenous malformation) spine 1981  . Bowel dysfunction    constipation and fecal retention  . Cerumen impaction 10/11/2015  . Complication of anesthesia    low temperature after general anesthesia for colostomy  . Constipation 01/25/2016  . Contusion 05/18/2014   Of foot  . Depression 02/13/2017   History of   . Headache 04/09/2017  . History of kidney stones   . History of neurogenic bowel   . History of rotator cuff tear    Right  . History of ventral hernia   . Hyperglycemia 05/18/2015  . Hyperlipidemia   . Hypertension    No medications at this time  . Incomplete quadriplegia at C5-6 level ([place]) 07/24/2008   Qualifier: Diagnosis of  By: Wynona Luna  Requires bowel regimen including enemas and indwelling foley cath Has had tendency towards pressure sores in past. Had a tear with a transfer in Hospital in past for surgery and developed a sore at that time.   . Low blood pressure reading    Occassionally  . Medicare annual wellness visit, subsequent 11/02/2014   Follows with [facility] Declines colonoscopy  No h/o abnormal pap, declines further paps  . MVP (mitral valve prolapse) 12/01/2012   Mild  . Neurogenic bladder   . OA (osteoarthritis)    Hip and shoulders, neck  . Obesity 11/01/2016  . OSA on CPAP 01/31/2016  . Pain in joint, shoulder region 12/01/2012   Long history B/l Follows with Dr Hassell Done at [facility]  . Postmenopausal bleeding 12/17/2012  . Preventative health care 06/26/2013  . Quadriplegia ([place]) 1981   Secondary to ruptured AVM C5-C6  . Sacral pressure ulcer 12/01/2012   H/o Follows with Dr Tomi Likens of Dermatology  . Skin cancer 11/01/2016   2 BCC RLE excised by Dr Wilhemina Bonito 2018 [place] LLE excised by Dr Wilhemina Bonito 2018  . Vaginitis and vulvovaginitis 12/17/2012  . Wears glasses     Past Surgical History:  Procedure Laterality Date  . ANAL TAG REMOVAL  2014  . BASAL CELL CARCINOMA EXCISION  2016   Lip  . BASAL CELL CARCINOMA EXCISION  2018   right leg x2  . BASAL CELL CARCINOMA EXCISION Left 2018  leg, chest  . BASAL CELL CARCINOMA EXCISION Right 2019   leg  . BLADDER STONE REMOVAL  1986/87  . COLONOSCOPY  02/22/2016  . COLOSTOMY  02/22/2016  . CYSTOSCOPY WITH BIOPSY N/A 06/21/2018   Procedure: CYSTOSCOPY WITH BIOPSY FULGURATION;  Surgeon: Irine Seal, MD;  Location: [facility];  Service: Urology;  Laterality: N/A;  . CYSTOSCOPY WITH LITHOLAPAXY N/A 06/21/2018   Procedure: CYSTOSCOPY WITH LITHOLAPAXY;  Surgeon: Irine Seal, MD;  Location: [facility];  Service: Urology;  Laterality: N/A;  . DORSAL CHEILECTOMY-HALLUX RIGIDUS Bilateral 2011  . HERNIA REPAIR  0000000    VENTRAL umbilical hernia repair  . LAMINECTOMY  10/23/1979   AVM  . LAPAROSCOPIC SIGMOID COLECTOMY  02/22/2016  . [place]   left shoulder  . ROTATOR CUFF REPAIR Right 2001  . SHOULDER ARTHROSCOPY  2007   right  . SQUAMOUS CELL CARCINOMA EXCISION Left 2018   leg  . Toe Nails Removal Bilateral 1981   both great toes  . TOTAL SHOULDER REPLACEMENT  2001   left  . TRANSFER TENDON HAND  1982, 1983   to right hand X3  . VENTRAL HERNIA REPAIR  08/17/2010   Postop ileus (Dr. Redmond Pulling)    Family History  Problem Relation Age of Onset  . Cancer Mother        breast & melanoma  . Hypertension Mother   . Heart disease Father        CAD, MI, AFib  . Cancer Sister        melanoma  . Cancer Maternal Grandfather        melanoma  . Stroke Paternal Grandmother   . Heart disease Paternal Grandfather   . Cancer Maternal Aunt        breast  . Cancer Paternal Aunt        breast  . Cancer Paternal Uncle        breast  . Cancer Paternal Aunt        breast    Social History   Socioeconomic History  . Marital status: Single    Spouse name: Not on file  . Number of children: Not on file  . Years of education: Not on file  . Highest education level: Not on file  Occupational History  . Occupation: Disabled    Comment: Music therapist  Tobacco Use  . Smoking status: Never Smoker  . Smokeless tobacco: Never Used  Substance and Sexual Activity  . Alcohol use: No  . Drug use: No  . Sexual activity: Not on file    Comment: lives with sister and mother, no major dietary restrictions  Other Topics Concern  . Not on file  Social History Narrative   Sister is primary caregiver   Social Determinants of Health   Financial Resource Strain:   . Difficulty of Paying Living Expenses: Not on file  Food Insecurity:   . Worried About Charity fundraiser in the Last Year: Not on file  . Ran Out of Food in the Last Year: Not on file  Transportation Needs:   . Lack of  Transportation (Medical): Not on file  . Lack of Transportation (Non-Medical): Not on file  Physical Activity:   . Days of Exercise per Week: Not on file  . Minutes of Exercise per Session: Not on file  Stress:   . Feeling of Stress : Not on file  Social Connections:   . Frequency of Communication with  Friends and Family: Not on file  . Frequency of Social Gatherings with Friends and Family: Not on file  . Attends Religious Services: Not on file  . Active Member of Clubs or Organizations: Not on file  . Attends Archivist Meetings: Not on file  . Marital Status: Not on file  Intimate Partner Violence:   . Fear of Current or Ex-Partner: Not on file  . Emotionally Abused: Not on file  . Physically Abused: Not on file  . Sexually Abused: Not on file    Outpatient Medications Prior to Visit  Medication Sig Dispense Refill  . clobetasol (TEMOVATE) 0.05 % external solution Apply 1 application topically every Monday, Wednesday, and Friday. Use on scalp 3 times per week    . docusate sodium (COLACE) 100 MG capsule Take 1 tablet by mouth daily.    Marland Kitchen FIBER PO Take 1 capsule by mouth daily.    . furosemide (LASIX) 20 MG tablet Take 1 tablet (20 mg total) by mouth 2 (two) times daily. 180 tablet 1  . Multiple Vitamins-Calcium (ONE-A-DAY WOMENS FORMULA PO) Take 1 tablet by mouth daily.      Marland Kitchen oxybutynin (DITROPAN) 5 MG tablet Take 1 tablet (5 mg total) by mouth every morning. 90 tablet 1  . potassium chloride SA (K-DUR) 20 MEQ tablet Take 2 tablets (40 mEq total) by mouth 2 (two) times daily. 360 tablet 1  . Probiotic Product (DIGESTIVE ADVANTAGE) CAPS Take 1 tablet by mouth daily.    . baclofen (LIORESAL) 20 MG tablet TAKE 1/2 TO 1 (10-20MG S) TABLET BY MOUTH4 TIMES DAILY AS NEEDED FOR MUSCLE SPASMS 90 each 3  . mupirocin ointment (BACTROBAN) 2 % Place 1 application into the nose 2 (two) times daily. (Patient not taking: Reported on 06/21/2018) 22 g 1  . rosuvastatin (CRESTOR) 5 MG tablet  Take 1 tablet (5 mg total) by mouth daily. 90 tablet 3   No facility-administered medications prior to visit.    Allergies  Allergen Reactions  . Adhesive [Tape] Other (See Comments)    Skin irritation Does not tolerate Convotech, tolerates Hollister    Review of Systems  Constitutional: Positive for malaise/fatigue. Negative for fever.  HENT: Negative for congestion.   Eyes: Negative for blurred vision.  Respiratory: Negative for shortness of breath.   Cardiovascular: Negative for chest pain, palpitations and leg swelling.  Gastrointestinal: Negative for abdominal pain, blood in stool and nausea.  Genitourinary: Positive for dysuria. Negative for frequency.  Musculoskeletal: Positive for joint pain. Negative for falls.  Skin: Negative for rash.  Neurological: Negative for dizziness, loss of consciousness and headaches.  Endo/Heme/Allergies: Negative for environmental allergies.  Psychiatric/Behavioral: Negative for depression. The patient is not nervous/anxious.        Objective:    Physical Exam Constitutional:      Appearance: Normal appearance. She is not ill-appearing.  HENT:     Head: Normocephalic and atraumatic.     Nose: Nose normal.  Eyes:     General:        Right eye: No discharge.        Left eye: No discharge.  Pulmonary:     Effort: Pulmonary effort is normal.  Neurological:     Mental Status: She is alert and oriented to person, place, and time.  Psychiatric:        Behavior: Behavior normal.     BP (!) 178/97 (BP Location: Left Arm, Patient Position: Sitting, Cuff Size: Normal)   Pulse 67  Wt 225 lb (102.1 kg)   BMI 30.94 kg/m  Wt Readings from Last 3 Encounters:  06/18/19 225 lb (102.1 kg)  06/25/18 230 lb (104.3 kg)  06/21/18 230 lb (104.3 kg)    Diabetic Foot Exam - Simple   No data filed     Lab Results  Component Value Date   WBC 7.4 05/13/2019   HGB 12.5 05/13/2019   HCT 38.4 05/13/2019   PLT 284.0 05/13/2019   GLUCOSE 103  (H) 05/13/2019   CHOL 144 05/13/2019   TRIG 137.0 05/13/2019   HDL 37.20 (L) 05/13/2019   LDLDIRECT 125.0 06/25/2018   LDLCALC 79 05/13/2019   ALT 36 (H) 05/13/2019   AST 34 05/13/2019   NA 138 05/13/2019   K 3.8 05/13/2019   CL 102 05/13/2019   CREATININE 0.38 (L) 05/13/2019   BUN 5 (L) 05/13/2019   CO2 27 05/13/2019   TSH 4.13 05/13/2019   HGBA1C 5.9 05/13/2019    Lab Results  Component Value Date   TSH 4.13 05/13/2019   Lab Results  Component Value Date   WBC 7.4 05/13/2019   HGB 12.5 05/13/2019   HCT 38.4 05/13/2019   MCV 79.6 05/13/2019   PLT 284.0 05/13/2019   Lab Results  Component Value Date   NA 138 05/13/2019   K 3.8 05/13/2019   CO2 27 05/13/2019   GLUCOSE 103 (H) 05/13/2019   BUN 5 (L) 05/13/2019   CREATININE 0.38 (L) 05/13/2019   BILITOT 0.5 05/13/2019   ALKPHOS 106 05/13/2019   AST 34 05/13/2019   ALT 36 (H) 05/13/2019   PROT 7.0 05/13/2019   ALBUMIN 3.6 05/13/2019   CALCIUM 9.3 05/13/2019   ANIONGAP 6 06/21/2018   GFR 169.16 05/13/2019   Lab Results  Component Value Date   CHOL 144 05/13/2019   Lab Results  Component Value Date   HDL 37.20 (L) 05/13/2019   Lab Results  Component Value Date   LDLCALC 79 05/13/2019   Lab Results  Component Value Date   TRIG 137.0 05/13/2019   Lab Results  Component Value Date   CHOLHDL 4 05/13/2019   Lab Results  Component Value Date   HGBA1C 5.9 05/13/2019       Assessment & Plan:   Problem List Items Addressed This Visit    Hyperlipidemia    Encouraged heart healthy diet, increase exercise, avoid trans fats, consider a krill oil cap daily. Crestor 5 mg       Relevant Medications   rosuvastatin (CRESTOR) 5 MG tablet   hydrochlorothiazide (MICROZIDE) 12.5 MG capsule   Incomplete quadriplegia at C5-6 level ([place])    Has aides in the home to help with her care and is encouraged to keep everyone masked the entire time they are in the home. And consider opening the windows when able.       Essential hypertension    Monitor and report any concerns, has been elevated will add HCTZ 12.5 mg. Encouraged heart healthy diet such as the DASH diet and exercise as tolerated.       Relevant Medications   rosuvastatin (CRESTOR) 5 MG tablet   hydrochlorothiazide (MICROZIDE) 12.5 MG capsule   Neurogenic bladder    contines to manage her indwelling catheter with [facility]. Some dysuria and overflow noted. Started on Cefdinir 300 mg bid      Hyperglycemia    hgba1c acceptable, minimize simple carbs. Increase exercise as tolerated.       Right hip pain  given refill on Baclofen to continue to use.       Colostomy status ([place])    Continues to do well with colostomy management         I am having [name] start on cefdinir and hydrochlorothiazide. I am also having her maintain her Multiple Vitamins-Calcium (ONE-A-DAY WOMENS FORMULA PO), clobetasol, Digestive Advantage, docusate sodium, FIBER PO, potassium chloride SA, furosemide, oxybutynin, mupirocin ointment, baclofen, and rosuvastatin.  Meds ordered this encounter  Medications  . mupirocin ointment (BACTROBAN) 2 %    Sig: Place 1 application into the nose 2 (two) times daily.    Dispense:  22 g    Refill:  1  . baclofen (LIORESAL) 20 MG tablet    Sig: TAKE 1/2 TO 1 (10-20MG S) TABLET BY MOUTH4 TIMES DAILY AS NEEDED FOR MUSCLE SPASMS    Dispense:  90 each    Refill:  3  . rosuvastatin (CRESTOR) 5 MG tablet    Sig: Take 1 tablet (5 mg total) by mouth daily.    Dispense:  90 tablet    Refill:  3  . cefdinir (OMNICEF) 300 MG capsule    Sig: Take 1 capsule (300 mg total) by mouth 2 (two) times daily for 10 days.    Dispense:  14 capsule    Refill:  0  . hydrochlorothiazide (MICROZIDE) 12.5 MG capsule    Sig: Take 1 capsule (12.5 mg total) by mouth daily.    Dispense:  30 capsule    Refill:  3    I discussed the assessment and treatment plan with the patient. The patient was provided an opportunity to ask  questions and all were answered. The patient agreed with the plan and demonstrated an understanding of the instructions.   The patient was advised to call back or seek an in-person evaluation if the symptoms worsen or if the condition fails to improve as anticipated.  I provided 25 minutes of non-face-to-face time during this encounter.   Penni Homans, MD

## 2019-06-24 NOTE — Assessment & Plan Note (Signed)
Continues to do well with colostomy management

## 2019-06-24 NOTE — Assessment & Plan Note (Addendum)
Encouraged heart healthy diet, increase exercise, avoid trans fats, consider a krill oil cap daily. Crestor 5 mg

## 2019-06-24 NOTE — Assessment & Plan Note (Signed)
given refill on Baclofen to continue to use.

## 2019-06-24 NOTE — Assessment & Plan Note (Addendum)
contines to manage her indwelling catheter with [facility]. Some dysuria and overflow noted. Started on Cefdinir 300 mg bid

## 2019-06-24 NOTE — Assessment & Plan Note (Addendum)
Monitor and report any concerns, has been elevated will add HCTZ 12.5 mg. Encouraged heart healthy diet such as the DASH diet and exercise as tolerated.

## 2019-06-24 NOTE — Assessment & Plan Note (Signed)
Has aides in the home to help with her care and is encouraged to keep everyone masked the entire time they are in the home. And consider opening the windows when able.

## 2019-06-26 ENCOUNTER — Telehealth: Payer: Self-pay | Admitting: Family Medicine

## 2019-06-26 NOTE — Telephone Encounter (Signed)
She only needed a 7 day course just forgot to erase the auto populated statement Epic puts in saying 10 days. Please let her know

## 2019-06-26 NOTE — Telephone Encounter (Signed)
Pt was called and was told that Dr Charlett Blake is out of the office today but message would be sent to her for clarification on instructions.   #14 pills sent in and instructions state take for 10 days.  Please clarify

## 2019-06-26 NOTE — Telephone Encounter (Signed)
Copied from [place] [id number]. Topic: General - Other >> Jun 26, 2019  8:09 AM Leward Quan A wrote: Reason for CRM: Patient called to inquire of Dr Charlett Blake about the Rx sent to pharmacy for cefdinir (OMNICEF) 300 MG capsule 14 pills sent in but instructions say for 10 days. Asking for a call back to clarify. She have completed the 14 pills. Please call Ph# [phone]

## 2019-06-27 DIAGNOSIS — G8250 Quadriplegia, unspecified: Secondary | ICD-10-CM | POA: Diagnosis not present

## 2019-06-27 DIAGNOSIS — Z933 Colostomy status: Secondary | ICD-10-CM | POA: Diagnosis not present

## 2019-06-27 DIAGNOSIS — R32 Unspecified urinary incontinence: Secondary | ICD-10-CM | POA: Diagnosis not present

## 2019-06-27 NOTE — Telephone Encounter (Signed)
Spoke with patient about medication changes she voiced her understanding

## 2019-06-28 ENCOUNTER — Other Ambulatory Visit: Payer: Self-pay

## 2019-06-28 ENCOUNTER — Ambulatory Visit (INDEPENDENT_AMBULATORY_CARE_PROVIDER_SITE_OTHER): Payer: Medicare PPO | Admitting: Family Medicine

## 2019-06-28 ENCOUNTER — Ambulatory Visit: Payer: Self-pay | Admitting: *Deleted

## 2019-06-28 ENCOUNTER — Encounter: Payer: Self-pay | Admitting: Family Medicine

## 2019-06-28 DIAGNOSIS — I10 Essential (primary) hypertension: Secondary | ICD-10-CM

## 2019-06-28 DIAGNOSIS — Z87440 Personal history of urinary (tract) infections: Secondary | ICD-10-CM | POA: Diagnosis not present

## 2019-06-28 MED ORDER — LISINOPRIL 10 MG PO TABS: 10.0000 mg | ORAL_TABLET | Freq: Every day | ORAL | 2 refills | Status: DC

## 2019-06-28 NOTE — Progress Notes (Signed)
Virtual Visit via Video Note  I connected with [name] on 06/28/19 at  2:40 PM EST by a video enabled telemedicine application and verified that I am speaking with the correct person using two identifiers.  Location: Patient: home  Provider: home    I discussed the limitations of evaluation and management by telemedicine and the availability of in person appointments. The patient expressed understanding and agreed to proceed.  History of Present Illness: Pt is home -- c/o some vaginal spotting the last few days -- seen on the tp Pt is quadraplegic and has indwelling cathether -- she sees urology regularly and has an app 2/1 -- he does a pap for her  She just finished omnicef given by pcp for possible uti.  She wondered if the hctz was causing the bleeding -- she is only on 12.5 daily    Past Medical History:  Diagnosis Date  . Acute bronchitis 05/16/2015  . Anemia    after shoulder replacement  . Autonomic dysreflexia   . AVM (arteriovenous malformation) spine 1981  . Bowel dysfunction    constipation and fecal retention  . Cerumen impaction 10/11/2015  . Complication of anesthesia    low temperature after general anesthesia for colostomy  . Constipation 01/25/2016  . Contusion 05/18/2014   Of foot  . Depression 02/13/2017   History of   . Headache 04/09/2017  . History of kidney stones   . History of neurogenic bowel   . History of rotator cuff tear    Right  . History of ventral hernia   . Hyperglycemia 05/18/2015  . Hyperlipidemia   . Hypertension    No medications at this time  . Incomplete quadriplegia at C5-6 level ([place]) 07/24/2008   Qualifier: Diagnosis of  By: Wynona Luna Requires bowel regimen including enemas and indwelling foley cath Has had tendency towards pressure sores in past. Had a tear with a transfer in Hospital in past for surgery and developed a sore at that time.   . Low blood pressure reading    Occassionally  . Medicare annual wellness visit,  subsequent 11/02/2014   Follows with [facility] Declines colonoscopy  No h/o abnormal pap, declines further paps  . MVP (mitral valve prolapse) 12/01/2012   Mild  . Neurogenic bladder   . OA (osteoarthritis)    Hip and shoulders, neck  . Obesity 11/01/2016  . OSA on CPAP 01/31/2016  . Pain in joint, shoulder region 12/01/2012   Long history B/l Follows with Dr Hassell Done at [facility]  . Postmenopausal bleeding 12/17/2012  . Preventative health care 06/26/2013  . Quadriplegia ([place]) 1981   Secondary to ruptured AVM C5-C6  . Sacral pressure ulcer 12/01/2012   H/o Follows with Dr Tomi Likens of Dermatology  . Skin cancer 11/01/2016   2 BCC RLE excised by Dr Wilhemina Bonito 2018 [place] LLE excised by Dr Wilhemina Bonito 2018  . Vaginitis and vulvovaginitis 12/17/2012  . Wears glasses    Current Outpatient Medications on File Prior to Visit  Medication Sig Dispense Refill  . baclofen (LIORESAL) 20 MG tablet TAKE 1/2 TO 1 (10-20MG S) TABLET BY MOUTH4 TIMES DAILY AS NEEDED FOR MUSCLE SPASMS 90 each 3  . cefdinir (OMNICEF) 300 MG capsule Take 1 capsule (300 mg total) by mouth 2 (two) times daily for 10 days. 14 capsule 0  . clobetasol (TEMOVATE) 0.05 % external solution Apply 1 application topically every Monday, Wednesday, and Friday. Use on scalp 3 times  per week    . docusate sodium (COLACE) 100 MG capsule Take 1 tablet by mouth daily.    Marland Kitchen FIBER PO Take 1 capsule by mouth daily.    . hydrochlorothiazide (MICROZIDE) 12.5 MG capsule Take 1 capsule (12.5 mg total) by mouth daily. 30 capsule 3  . Multiple Vitamins-Calcium (ONE-A-DAY WOMENS FORMULA PO) Take 1 tablet by mouth daily.      . mupirocin ointment (BACTROBAN) 2 % Place 1 application into the nose 2 (two) times daily. 22 g 1  . oxybutynin (DITROPAN) 5 MG tablet Take 1 tablet (5 mg total) by mouth every morning. 90 tablet 1  . potassium chloride SA (K-DUR) 20 MEQ tablet Take 2 tablets (40 mEq total) by mouth 2 (two) times daily. 360 tablet 1  .  Probiotic Product (DIGESTIVE ADVANTAGE) CAPS Take 1 tablet by mouth daily.    . rosuvastatin (CRESTOR) 5 MG tablet Take 1 tablet (5 mg total) by mouth daily. 90 tablet 3   No current facility-administered medications on file prior to visit.   Allergies  Allergen Reactions  . Adhesive [Tape] Other (See Comments)    Skin irritation Does not tolerate Convotech, tolerates Hollister    Observations/Objective:father There were no vitals filed for this visit. 133/71   154/86 Her bp has also been running high  Assessment and Plan: 1. Essential hypertension Poorly controlled will alter medications, encouraged DASH diet, minimize caffeine and obtain adequate sleep. Report concerning symptoms and follow up as directed and as needed - lisinopril (ZESTRIL) 10 MG tablet; Take 1 tablet (10 mg total) by mouth daily.  Dispense: 30 tablet; Refill: 2  2. History of UTI And ? Vaginal bleeding  Pt unable to come in due to transportation issues--- urology is doing pap 2/1  We will check urine and make sure no uti  - POCT Urinalysis Dipstick (Automated); Future - Urine culture; Future   Follow Up Instructions:    I discussed the assessment and treatment plan with the patient. The patient was provided an opportunity to ask questions and all were answered. The patient agreed with the plan and demonstrated an understanding of the instructions.   The patient was advised to call back or seek an in-person evaluation if the symptoms worsen or if the condition fails to improve as anticipated.     Ann Held, DO

## 2019-06-28 NOTE — Telephone Encounter (Signed)
Summary: medication side effect?   Pt states that she was put on hydrochlorothiazide (MICROZIDE) 12.5 MG capsule 10 days ago. Pt states that 2 days ago she started to have vaginal bleeding. Pt states that today the bleeding is dripping - states that she is a quadriplegic. Pt states that the last time she wiped there was a small blood clot there.   Pt states that she has no transportation at this time, her Lucianne Lei is in the shop.  Pt states at 9:30 BP was 161/90, pulse 58      Call to patient- patient had started new BP medication 10 days ago- it does not seem to be controlling her BP- her edema is controled. Patient also states she started vaginal spotting 2 days ago- light spotting with no abdominal pain. She does have a history of post menopausal bleeding which she was seen for 2014 with negative testing. Reason for Disposition . Postmenopausal vaginal bleeding . Systolic BP  >= 0000000 OR Diastolic >= [id number]  Answer Assessment - Initial Assessment Questions 1. AMOUNT: "Describe the bleeding that you are having." "How much bleeding is there?"    - SPOTTING: spotting, or pinkish / brownish mucous discharge; does not fill panti-liner or pad    - MILD:  less than 1 pad / hour; less than patient's  menstrual bleeding when she still had menstrual periods   - MODERATE: 1-2 pads / hour; small-medium blood clots (e.g., pea, grape, small coin)    - SEVERE: soaking 2 or more pads/hour for 2 or more hours; bleeding not contained by pads or continuous red blood from vagina; large blood clots (e.g., golf ball, large coin)      spotting 2. ONSET: "When did the bleeding begin?" "Is it continuing now?"     2 days ago- light spotting 3. MENOPAUSE: "When was your last menstrual period?"      Years ago 4. ABDOMINAL PAIN: "Do you have any pain?" "How bad is the pain?"  (e.g., Scale 1-10; mild, moderate, or severe)   - MILD (1-3): doesn't interfere with normal activities, abdomen soft and not tender to touch    -  MODERATE (4-7): interferes with normal activities or awakens from sleep, tender to touch    - SEVERE (8-10): excruciating pain, doubled over, unable to do any normal activities      no 5. BLOOD THINNERS: "Do you take any blood thinners?" (e.g., Coumadin/warfarin, Pradaxa/dabigatran, aspirin)     No- she is taking baby aspirin  6. HORMONES: "Are you taking any hormone medications, prescription or OTC?" (e.g., birth control pills, estrogen)     no 7. CAUSE: "What do you think is causing the bleeding?" (e.g., recent gyn surgery, recent gyn procedure; known bleeding disorder, uterine cancer)       Not sure 8. HEMODYNAMIC STATUS: "Are you weak or feeling lightheaded?" If so, ask: "Can you stand and walk normally?"       no 9. OTHER SYMPTOMS: "What other symptoms are you having with the bleeding?" (e.g., back pain, burning with urination, fever)     No- some burning in bladder- she was treated with antibiotic- symptoms improved.  Answer Assessment - Initial Assessment Questions 1. BLOOD PRESSURE: "What is the blood pressure?" "Did you take at least two measurements 5 minutes apart?"     161/90, 175/93 P 57 2. ONSET: "When did you take your blood pressure?"     9:30 am 3. HOW: "How did you obtain the blood pressure?" (e.g., visiting nurse, automatic  home BP monitor)     Automatic wrist cuff 4. HISTORY: "Do you have a history of high blood pressure?"     no 5. MEDICATIONS: "Are you taking any medications for blood pressure?" "Have you missed any doses recently?"     Patient has been on furosemide for years and she changed to HCTZ 10 ten days ago. Lowest- 136/77- mostly 160 and above 6. OTHER SYMPTOMS: "Do you have any symptoms?" (e.g., headache, chest pain, blurred vision, difficulty breathing, weakness)     no 7. PREGNANCY: "Is there any chance you are pregnant?" "When was your last menstrual period?"     n/a  Protocols used: VAGINAL BLEEDING - POSTMENOPAUSAL-A-AH, HIGH BLOOD  PRESSURE-A-AH

## 2019-07-01 ENCOUNTER — Other Ambulatory Visit (INDEPENDENT_AMBULATORY_CARE_PROVIDER_SITE_OTHER): Payer: Medicare PPO

## 2019-07-01 DIAGNOSIS — Z87440 Personal history of urinary (tract) infections: Secondary | ICD-10-CM

## 2019-07-01 LAB — POC URINALSYSI DIPSTICK (AUTOMATED)
Bilirubin, UA: NEGATIVE
Blood, UA: NEGATIVE
Glucose, UA: NEGATIVE
Ketones, UA: NEGATIVE
Leukocytes, UA: NEGATIVE
Nitrite, UA: NEGATIVE
Protein, UA: NEGATIVE
Spec Grav, UA: 1.01 (ref 1.010–1.025)
Urobilinogen, UA: 0.2 E.U./dL
pH, UA: 7 (ref 5.0–8.0)

## 2019-07-02 DIAGNOSIS — R32 Unspecified urinary incontinence: Secondary | ICD-10-CM | POA: Diagnosis not present

## 2019-07-02 DIAGNOSIS — Z933 Colostomy status: Secondary | ICD-10-CM | POA: Diagnosis not present

## 2019-07-02 DIAGNOSIS — G8250 Quadriplegia, unspecified: Secondary | ICD-10-CM | POA: Diagnosis not present

## 2019-07-02 LAB — URINE CULTURE
MICRO NUMBER:: 10052388
SPECIMEN QUALITY:: ADEQUATE

## 2019-07-10 ENCOUNTER — Telehealth: Payer: Self-pay

## 2019-07-10 NOTE — Telephone Encounter (Signed)
Patient called stating this afternoon around 3:30 her blood pressure was running 73/44, she rechecked and it was 74/40. She states Dr. Charlett Blake started her on HCTZ  12.5 Daily. She recently saw Dr. Etter Sjogren and was added lisinopril 10 mg daily due to high readings. Since starting the lisinopril she has felt dizzy and nauseous. Pt denies chest pain, shortness or breath and slurred speech. Patient is a quadriplegic she she did try to lift her legs above her heart to increase circulation and see if that would help. After doing so she checked her blood pressure and it is up to 101/60 with a pulse of 67. She has no transportation as her Lucianne Lei is in the shop and has no one to call. Patient declined going to the ER as she states she doesn't feel it is necessary. Please advise on what patient is to do in absence of PCP.

## 2019-07-10 NOTE — Telephone Encounter (Signed)
Called her back Lisinopril added about 2 weeks ago  Discussed her situation- she was not feeling well and checked her BP- it was low so she called.  Her BP since then has improved  Her BP is now 129/74  BP Readings from Last 3 Encounters:  06/18/19 (!) 178/97  03/04/19 135/86  10/22/18 118/69   She will tilt her WC back to raise her legs when her BP is low  Will have her halve her lisinopril and take 5 mg If her BP runs low again she will stop it entirely and let us know

## 2019-07-12 DIAGNOSIS — Z96611 Presence of right artificial shoulder joint: Secondary | ICD-10-CM | POA: Diagnosis not present

## 2019-07-12 DIAGNOSIS — Z933 Colostomy status: Secondary | ICD-10-CM | POA: Diagnosis not present

## 2019-07-12 DIAGNOSIS — G8250 Quadriplegia, unspecified: Secondary | ICD-10-CM | POA: Diagnosis not present

## 2019-07-12 DIAGNOSIS — I10 Essential (primary) hypertension: Secondary | ICD-10-CM | POA: Diagnosis not present

## 2019-07-12 DIAGNOSIS — Z471 Aftercare following joint replacement surgery: Secondary | ICD-10-CM | POA: Diagnosis not present

## 2019-07-15 DIAGNOSIS — N200 Calculus of kidney: Secondary | ICD-10-CM | POA: Diagnosis not present

## 2019-07-15 DIAGNOSIS — N312 Flaccid neuropathic bladder, not elsewhere classified: Secondary | ICD-10-CM | POA: Diagnosis not present

## 2019-07-15 DIAGNOSIS — N210 Calculus in bladder: Secondary | ICD-10-CM | POA: Diagnosis not present

## 2019-07-15 DIAGNOSIS — Z01419 Encounter for gynecological examination (general) (routine) without abnormal findings: Secondary | ICD-10-CM | POA: Diagnosis not present

## 2019-07-15 DIAGNOSIS — N130 Hydronephrosis with ureteropelvic junction obstruction: Secondary | ICD-10-CM | POA: Diagnosis not present

## 2019-07-17 DIAGNOSIS — I10 Essential (primary) hypertension: Secondary | ICD-10-CM | POA: Diagnosis not present

## 2019-07-17 DIAGNOSIS — Z471 Aftercare following joint replacement surgery: Secondary | ICD-10-CM | POA: Diagnosis not present

## 2019-07-17 DIAGNOSIS — Z933 Colostomy status: Secondary | ICD-10-CM | POA: Diagnosis not present

## 2019-07-17 DIAGNOSIS — G8250 Quadriplegia, unspecified: Secondary | ICD-10-CM | POA: Diagnosis not present

## 2019-07-17 DIAGNOSIS — Z96611 Presence of right artificial shoulder joint: Secondary | ICD-10-CM | POA: Diagnosis not present

## 2019-07-18 ENCOUNTER — Encounter: Payer: Self-pay | Admitting: Family Medicine

## 2019-07-18 DIAGNOSIS — N939 Abnormal uterine and vaginal bleeding, unspecified: Secondary | ICD-10-CM | POA: Insufficient documentation

## 2019-07-19 DIAGNOSIS — I10 Essential (primary) hypertension: Secondary | ICD-10-CM | POA: Diagnosis not present

## 2019-07-19 DIAGNOSIS — Z471 Aftercare following joint replacement surgery: Secondary | ICD-10-CM | POA: Diagnosis not present

## 2019-07-19 DIAGNOSIS — G8250 Quadriplegia, unspecified: Secondary | ICD-10-CM | POA: Diagnosis not present

## 2019-07-19 DIAGNOSIS — Z933 Colostomy status: Secondary | ICD-10-CM | POA: Diagnosis not present

## 2019-07-19 DIAGNOSIS — Z96611 Presence of right artificial shoulder joint: Secondary | ICD-10-CM | POA: Diagnosis not present

## 2019-07-23 DIAGNOSIS — Z933 Colostomy status: Secondary | ICD-10-CM | POA: Diagnosis not present

## 2019-07-23 DIAGNOSIS — G8250 Quadriplegia, unspecified: Secondary | ICD-10-CM | POA: Diagnosis not present

## 2019-07-23 DIAGNOSIS — Z96611 Presence of right artificial shoulder joint: Secondary | ICD-10-CM | POA: Diagnosis not present

## 2019-07-23 DIAGNOSIS — Z471 Aftercare following joint replacement surgery: Secondary | ICD-10-CM | POA: Diagnosis not present

## 2019-07-23 DIAGNOSIS — I10 Essential (primary) hypertension: Secondary | ICD-10-CM | POA: Diagnosis not present

## 2019-07-24 DIAGNOSIS — Z471 Aftercare following joint replacement surgery: Secondary | ICD-10-CM | POA: Diagnosis not present

## 2019-07-24 DIAGNOSIS — Z96611 Presence of right artificial shoulder joint: Secondary | ICD-10-CM | POA: Diagnosis not present

## 2019-07-24 DIAGNOSIS — Z933 Colostomy status: Secondary | ICD-10-CM | POA: Diagnosis not present

## 2019-07-24 DIAGNOSIS — G8250 Quadriplegia, unspecified: Secondary | ICD-10-CM | POA: Diagnosis not present

## 2019-07-24 DIAGNOSIS — I10 Essential (primary) hypertension: Secondary | ICD-10-CM | POA: Diagnosis not present

## 2019-07-25 DIAGNOSIS — G8250 Quadriplegia, unspecified: Secondary | ICD-10-CM | POA: Diagnosis not present

## 2019-07-25 DIAGNOSIS — Z96611 Presence of right artificial shoulder joint: Secondary | ICD-10-CM | POA: Diagnosis not present

## 2019-07-25 DIAGNOSIS — Z933 Colostomy status: Secondary | ICD-10-CM | POA: Diagnosis not present

## 2019-07-25 DIAGNOSIS — Z471 Aftercare following joint replacement surgery: Secondary | ICD-10-CM | POA: Diagnosis not present

## 2019-07-25 DIAGNOSIS — I10 Essential (primary) hypertension: Secondary | ICD-10-CM | POA: Diagnosis not present

## 2019-07-26 DIAGNOSIS — G8250 Quadriplegia, unspecified: Secondary | ICD-10-CM | POA: Diagnosis not present

## 2019-07-26 DIAGNOSIS — I10 Essential (primary) hypertension: Secondary | ICD-10-CM | POA: Diagnosis not present

## 2019-07-26 DIAGNOSIS — Z96611 Presence of right artificial shoulder joint: Secondary | ICD-10-CM | POA: Diagnosis not present

## 2019-07-26 DIAGNOSIS — Z933 Colostomy status: Secondary | ICD-10-CM | POA: Diagnosis not present

## 2019-07-26 DIAGNOSIS — Z471 Aftercare following joint replacement surgery: Secondary | ICD-10-CM | POA: Diagnosis not present

## 2019-07-27 ENCOUNTER — Encounter: Payer: Self-pay | Admitting: Family Medicine

## 2019-07-29 ENCOUNTER — Other Ambulatory Visit: Payer: Self-pay | Admitting: Family Medicine

## 2019-07-29 DIAGNOSIS — Z933 Colostomy status: Secondary | ICD-10-CM | POA: Diagnosis not present

## 2019-07-29 DIAGNOSIS — G8250 Quadriplegia, unspecified: Secondary | ICD-10-CM | POA: Diagnosis not present

## 2019-07-29 DIAGNOSIS — R32 Unspecified urinary incontinence: Secondary | ICD-10-CM | POA: Diagnosis not present

## 2019-07-29 MED ORDER — CEPHALEXIN 500 MG PO CAPS: 500.0000 mg | ORAL_CAPSULE | Freq: Three times a day (TID) | ORAL | 0 refills | Status: DC

## 2019-07-30 DIAGNOSIS — G8250 Quadriplegia, unspecified: Secondary | ICD-10-CM | POA: Diagnosis not present

## 2019-07-30 DIAGNOSIS — I10 Essential (primary) hypertension: Secondary | ICD-10-CM | POA: Diagnosis not present

## 2019-07-30 DIAGNOSIS — Z933 Colostomy status: Secondary | ICD-10-CM | POA: Diagnosis not present

## 2019-07-30 DIAGNOSIS — Z471 Aftercare following joint replacement surgery: Secondary | ICD-10-CM | POA: Diagnosis not present

## 2019-07-30 DIAGNOSIS — Z96611 Presence of right artificial shoulder joint: Secondary | ICD-10-CM | POA: Diagnosis not present

## 2019-07-30 NOTE — Telephone Encounter (Signed)
Patient was scheduled for [id number] virtual follow up

## 2019-08-03 DIAGNOSIS — G8250 Quadriplegia, unspecified: Secondary | ICD-10-CM | POA: Diagnosis not present

## 2019-08-03 DIAGNOSIS — Z96611 Presence of right artificial shoulder joint: Secondary | ICD-10-CM | POA: Diagnosis not present

## 2019-08-03 DIAGNOSIS — I10 Essential (primary) hypertension: Secondary | ICD-10-CM | POA: Diagnosis not present

## 2019-08-03 DIAGNOSIS — Z471 Aftercare following joint replacement surgery: Secondary | ICD-10-CM | POA: Diagnosis not present

## 2019-08-03 DIAGNOSIS — Z933 Colostomy status: Secondary | ICD-10-CM | POA: Diagnosis not present

## 2019-08-06 DIAGNOSIS — G8250 Quadriplegia, unspecified: Secondary | ICD-10-CM | POA: Diagnosis not present

## 2019-08-06 DIAGNOSIS — Z933 Colostomy status: Secondary | ICD-10-CM | POA: Diagnosis not present

## 2019-08-06 DIAGNOSIS — I10 Essential (primary) hypertension: Secondary | ICD-10-CM | POA: Diagnosis not present

## 2019-08-06 DIAGNOSIS — Z96611 Presence of right artificial shoulder joint: Secondary | ICD-10-CM | POA: Diagnosis not present

## 2019-08-06 DIAGNOSIS — Z471 Aftercare following joint replacement surgery: Secondary | ICD-10-CM | POA: Diagnosis not present

## 2019-08-08 DIAGNOSIS — Z471 Aftercare following joint replacement surgery: Secondary | ICD-10-CM | POA: Diagnosis not present

## 2019-08-08 DIAGNOSIS — Z933 Colostomy status: Secondary | ICD-10-CM | POA: Diagnosis not present

## 2019-08-08 DIAGNOSIS — G8250 Quadriplegia, unspecified: Secondary | ICD-10-CM | POA: Diagnosis not present

## 2019-08-08 DIAGNOSIS — Z96611 Presence of right artificial shoulder joint: Secondary | ICD-10-CM | POA: Diagnosis not present

## 2019-08-08 DIAGNOSIS — I10 Essential (primary) hypertension: Secondary | ICD-10-CM | POA: Diagnosis not present

## 2019-08-09 DIAGNOSIS — Z471 Aftercare following joint replacement surgery: Secondary | ICD-10-CM | POA: Diagnosis not present

## 2019-08-09 DIAGNOSIS — I10 Essential (primary) hypertension: Secondary | ICD-10-CM | POA: Diagnosis not present

## 2019-08-09 DIAGNOSIS — G8250 Quadriplegia, unspecified: Secondary | ICD-10-CM | POA: Diagnosis not present

## 2019-08-09 DIAGNOSIS — Z933 Colostomy status: Secondary | ICD-10-CM | POA: Diagnosis not present

## 2019-08-09 DIAGNOSIS — Z96611 Presence of right artificial shoulder joint: Secondary | ICD-10-CM | POA: Diagnosis not present

## 2019-08-12 ENCOUNTER — Other Ambulatory Visit: Payer: Self-pay

## 2019-08-12 ENCOUNTER — Encounter: Payer: Self-pay | Admitting: Family Medicine

## 2019-08-12 ENCOUNTER — Ambulatory Visit (INDEPENDENT_AMBULATORY_CARE_PROVIDER_SITE_OTHER): Payer: Medicare PPO | Admitting: Family Medicine

## 2019-08-12 DIAGNOSIS — L089 Local infection of the skin and subcutaneous tissue, unspecified: Secondary | ICD-10-CM | POA: Diagnosis not present

## 2019-08-12 DIAGNOSIS — R739 Hyperglycemia, unspecified: Secondary | ICD-10-CM | POA: Diagnosis not present

## 2019-08-12 DIAGNOSIS — I10 Essential (primary) hypertension: Secondary | ICD-10-CM

## 2019-08-12 MED ORDER — LISINOPRIL 5 MG PO TABS: 5.0000 mg | ORAL_TABLET | Freq: Every day | ORAL | 1 refills | Status: DC

## 2019-08-12 MED ORDER — CEFDINIR 300 MG PO CAPS: 300.0000 mg | ORAL_CAPSULE | Freq: Two times a day (BID) | ORAL | 0 refills | Status: AC

## 2019-08-12 NOTE — Assessment & Plan Note (Signed)
Right second toe. Responded some to Keflex but still has some swelling and redness still will try a course of Cefdinir and she will elevated. Apply mupirocin daily and keep it clean and dry. If no improvement she may need referral to wound management and if recurs may consider referral to ortho to evaluate foot.

## 2019-08-12 NOTE — Assessment & Plan Note (Signed)
hgba1c acceptable, minimize simple carbs. Increase exercise as tolerated. Continue current meds 

## 2019-08-12 NOTE — Assessment & Plan Note (Signed)
Monitor and report any concerns, no changes to meds. Encouraged heart healthy diet such as the DASH diet and exercise as tolerated.  ?

## 2019-08-12 NOTE — Progress Notes (Addendum)
Virtual Visit via Video Note  I connected with [name] on 08/12/19 at  3:20 PM EST by a video enabled telemedicine application and verified that I am speaking with the correct person using two identifiers.  Location: Patient: home Provider: office   I discussed the limitations of evaluation and management by telemedicine and the availability of in person appointments. The patient expressed understanding and agreed to proceed. [name], CMA was able to get the patient set up on a video visit.       Subjective:    Patient ID: [name], female    DOB: [birth year], 69 y.o.   MRN: [id number]  Chief Complaint  Patient presents with  . Toe lesion    "follow up, not getting worst but not going away"  . Hypertension    HPI Patient is in today for follow up on infetion of right second toe. It has improved some with keflex and mupirocin use intermittently. No fevers or chills. No malaise. She is unable to feel the toe due to her quadraplegia. She is eating well and no other acute concerns but she has had some labile blood pressures. She was too low with Lisinopril 10 mg but has been good on 5 mg daily. Denies CP/palp/SOB/HA/congestion/fevers/GI or GU c/o. Taking meds as prescribed  Past Medical History:  Diagnosis Date  . Acute bronchitis 05/16/2015  . Anemia    after shoulder replacement  . Autonomic dysreflexia   . AVM (arteriovenous malformation) spine 1981  . Bowel dysfunction    constipation and fecal retention  . Cerumen impaction 10/11/2015  . Complication of anesthesia    low temperature after general anesthesia for colostomy  . Constipation 01/25/2016  . Contusion 05/18/2014   Of foot  . Depression 02/13/2017   History of   . Headache 04/09/2017  . History of kidney stones   . History of neurogenic bowel   . History of rotator cuff tear    Right  . History of ventral hernia   . Hyperglycemia 05/18/2015  . Hyperlipidemia   . Hypertension    No medications at this  time  . Incomplete quadriplegia at C5-6 level ([place]) 07/24/2008   Qualifier: Diagnosis of  By: Wynona Luna Requires bowel regimen including enemas and indwelling foley cath Has had tendency towards pressure sores in past. Had a tear with a transfer in Hospital in past for surgery and developed a sore at that time.   . Low blood pressure reading    Occassionally  . Medicare annual wellness visit, subsequent 11/02/2014   Follows with [facility] Declines colonoscopy  No h/o abnormal pap, declines further paps  . MVP (mitral valve prolapse) 12/01/2012   Mild  . Neurogenic bladder   . OA (osteoarthritis)    Hip and shoulders, neck  . Obesity 11/01/2016  . OSA on CPAP 01/31/2016  . Pain in joint, shoulder region 12/01/2012   Long history B/l Follows with Dr Hassell Done at [facility]  . Postmenopausal bleeding 12/17/2012  . Preventative health care 06/26/2013  . Quadriplegia ([place]) 1981   Secondary to ruptured AVM C5-C6  . Sacral pressure ulcer 12/01/2012   H/o Follows with Dr Tomi Likens of Dermatology  . Skin cancer 11/01/2016   2 [facility] RLE excised by Dr Wilhemina Bonito 2018 [place] LLE excised by Dr Wilhemina Bonito 2018  . Vaginitis and vulvovaginitis 12/17/2012  . Wears glasses     Past Surgical History:  Procedure Laterality Date  .  ANAL TAG REMOVAL  2014  . BASAL CELL CARCINOMA EXCISION  2016   Lip  . BASAL CELL CARCINOMA EXCISION  2018   right leg x2  . BASAL CELL CARCINOMA EXCISION Left 2018   leg, chest  . BASAL CELL CARCINOMA EXCISION Right 2019   leg  . BLADDER STONE REMOVAL  1986/87  . COLONOSCOPY  02/22/2016  . COLOSTOMY  02/22/2016  . CYSTOSCOPY WITH BIOPSY N/A 06/21/2018   Procedure: CYSTOSCOPY WITH BIOPSY FULGURATION;  Surgeon: Irine Seal, MD;  Location: [facility];  Service: Urology;  Laterality: N/A;  . CYSTOSCOPY WITH LITHOLAPAXY N/A 06/21/2018   Procedure: CYSTOSCOPY WITH LITHOLAPAXY;  Surgeon: Irine Seal, MD;  Location: [facility];  Service:  Urology;  Laterality: N/A;  . DORSAL CHEILECTOMY-HALLUX RIGIDUS Bilateral 2011  . HERNIA REPAIR  0000000   VENTRAL umbilical hernia repair  . LAMINECTOMY  10/23/1979   AVM  . LAPAROSCOPIC SIGMOID COLECTOMY  02/22/2016  . [place]   left shoulder  . ROTATOR CUFF REPAIR Right 2001  . SHOULDER ARTHROSCOPY  2007   right  . SQUAMOUS CELL CARCINOMA EXCISION Left 2018   leg  . Toe Nails Removal Bilateral 1981   both great toes  . TOTAL SHOULDER REPLACEMENT  2001   left  . TRANSFER TENDON HAND  1982, 1983   to right hand X3  . VENTRAL HERNIA REPAIR  08/17/2010   Postop ileus (Dr. Redmond Pulling)    Family History  Problem Relation Age of Onset  . Cancer Mother        breast & melanoma  . Hypertension Mother   . Heart disease Father        CAD, MI, AFib  . Cancer Sister        melanoma  . Cancer Maternal Grandfather        melanoma  . Stroke Paternal Grandmother   . Heart disease Paternal Grandfather   . Cancer Maternal Aunt        breast  . Cancer Paternal Aunt        breast  . Cancer Paternal Uncle        breast  . Cancer Paternal Aunt        breast    Social History   Socioeconomic History  . Marital status: Single    Spouse name: Not on file  . Number of children: Not on file  . Years of education: Not on file  . Highest education level: Not on file  Occupational History  . Occupation: Disabled    Comment: Music therapist  Tobacco Use  . Smoking status: Never Smoker  . Smokeless tobacco: Never Used  Substance and Sexual Activity  . Alcohol use: No  . Drug use: No  . Sexual activity: Not on file    Comment: lives with sister and mother, no major dietary restrictions  Other Topics Concern  . Not on file  Social History Narrative   Sister is primary caregiver   Social Determinants of Health   Financial Resource Strain:   . Difficulty of Paying Living Expenses: Not on file  Food Insecurity:   . Worried About Charity fundraiser in the Last  Year: Not on file  . Ran Out of Food in the Last Year: Not on file  Transportation Needs:   . Lack of Transportation (Medical): Not on file  . Lack of Transportation (Non-Medical): Not on file  Physical Activity:   . Days  of Exercise per Week: Not on file  . Minutes of Exercise per Session: Not on file  Stress:   . Feeling of Stress : Not on file  Social Connections:   . Frequency of Communication with Friends and Family: Not on file  . Frequency of Social Gatherings with Friends and Family: Not on file  . Attends Religious Services: Not on file  . Active Member of Clubs or Organizations: Not on file  . Attends Archivist Meetings: Not on file  . Marital Status: Not on file  Intimate Partner Violence:   . Fear of Current or Ex-Partner: Not on file  . Emotionally Abused: Not on file  . Physically Abused: Not on file  . Sexually Abused: Not on file    Outpatient Medications Prior to Visit  Medication Sig Dispense Refill  . baclofen (LIORESAL) 20 MG tablet TAKE 1/2 TO 1 (10-20MG S) TABLET BY MOUTH4 TIMES DAILY AS NEEDED FOR MUSCLE SPASMS 90 each 3  . clobetasol (TEMOVATE) 0.05 % external solution Apply 1 application topically every Monday, Wednesday, and Friday. Use on scalp 3 times per week    . docusate sodium (COLACE) 100 MG capsule Take 1 tablet by mouth daily.    Marland Kitchen FIBER PO Take 1 capsule by mouth daily.    . hydrochlorothiazide (MICROZIDE) 12.5 MG capsule Take 1 capsule (12.5 mg total) by mouth daily. 30 capsule 3  . Multiple Vitamins-Calcium (ONE-A-DAY WOMENS FORMULA PO) Take 1 tablet by mouth daily.      . mupirocin ointment (BACTROBAN) 2 % Place 1 application into the nose 2 (two) times daily. 22 g 1  . oxybutynin (DITROPAN) 5 MG tablet Take 1 tablet (5 mg total) by mouth every morning. 90 tablet 1  . potassium chloride SA (K-DUR) 20 MEQ tablet Take 2 tablets (40 mEq total) by mouth 2 (two) times daily. 360 tablet 1  . Probiotic Product (DIGESTIVE ADVANTAGE) CAPS  Take 1 tablet by mouth daily.    . rosuvastatin (CRESTOR) 5 MG tablet Take 1 tablet (5 mg total) by mouth daily. 90 tablet 3  . cephALEXin (KEFLEX) 500 MG capsule Take 1 capsule (500 mg total) by mouth 3 (three) times daily. 30 capsule 0  . lisinopril (ZESTRIL) 10 MG tablet Take 1 tablet (10 mg total) by mouth daily. 30 tablet 2   No facility-administered medications prior to visit.    Allergies  Allergen Reactions  . Adhesive [Tape] Other (See Comments)    Skin irritation Does not tolerate [employer], tolerates [place]    Review of Systems  Constitutional: Negative for fever and malaise/fatigue.  HENT: Negative for congestion.   Eyes: Negative for blurred vision.  Respiratory: Negative for shortness of breath.   Cardiovascular: Negative for chest pain, palpitations and leg swelling.  Gastrointestinal: Negative for abdominal pain, blood in stool and nausea.  Genitourinary: Negative for dysuria and frequency.  Musculoskeletal: Negative for falls.  Skin: Positive for rash.  Neurological: Negative for dizziness, loss of consciousness and headaches.  Endo/Heme/Allergies: Negative for environmental allergies.  Psychiatric/Behavioral: Negative for depression. The patient is not nervous/anxious.        Objective:    Physical Exam Constitutional:      Appearance: Normal appearance. She is not ill-appearing.  HENT:     Head: Normocephalic and atraumatic.     Nose: Nose normal.  Eyes:     General:        Right eye: No discharge.        Left eye: No  discharge.  Pulmonary:     Effort: Pulmonary effort is normal.  Skin:    Comments: Round scab over second metatarsal of right foot, dorsal surface. Mild erythema and swellign surrounds area.   Neurological:     Mental Status: She is alert.     BP 126/73 (BP Location: Left Wrist)   Pulse 67   Temp 98.4 F (36.9 C) (Temporal)   SpO2 98%  Wt Readings from Last 3 Encounters:  06/18/19 225 lb (102.1 kg)  06/25/18 230 lb (104.3  kg)  06/21/18 230 lb (104.3 kg)    Diabetic Foot Exam - Simple   No data filed     Lab Results  Component Value Date   WBC 7.4 05/13/2019   HGB 12.5 05/13/2019   HCT 38.4 05/13/2019   PLT 284.0 05/13/2019   GLUCOSE 103 (H) 05/13/2019   CHOL 144 05/13/2019   TRIG 137.0 05/13/2019   HDL 37.20 (L) 05/13/2019   LDLDIRECT 125.0 06/25/2018   LDLCALC 79 05/13/2019   ALT 36 (H) 05/13/2019   AST 34 05/13/2019   NA 138 05/13/2019   K 3.8 05/13/2019   CL 102 05/13/2019   CREATININE 0.38 (L) 05/13/2019   BUN 5 (L) 05/13/2019   CO2 27 05/13/2019   TSH 4.13 05/13/2019   HGBA1C 5.9 05/13/2019    Lab Results  Component Value Date   TSH 4.13 05/13/2019   Lab Results  Component Value Date   WBC 7.4 05/13/2019   HGB 12.5 05/13/2019   HCT 38.4 05/13/2019   MCV 79.6 05/13/2019   PLT 284.0 05/13/2019   Lab Results  Component Value Date   NA 138 05/13/2019   K 3.8 05/13/2019   CO2 27 05/13/2019   GLUCOSE 103 (H) 05/13/2019   BUN 5 (L) 05/13/2019   CREATININE 0.38 (L) 05/13/2019   BILITOT 0.5 05/13/2019   ALKPHOS 106 05/13/2019   AST 34 05/13/2019   ALT 36 (H) 05/13/2019   PROT 7.0 05/13/2019   ALBUMIN 3.6 05/13/2019   CALCIUM 9.3 05/13/2019   ANIONGAP 6 06/21/2018   GFR 169.16 05/13/2019   Lab Results  Component Value Date   CHOL 144 05/13/2019   Lab Results  Component Value Date   HDL 37.20 (L) 05/13/2019   Lab Results  Component Value Date   LDLCALC 79 05/13/2019   Lab Results  Component Value Date   TRIG 137.0 05/13/2019   Lab Results  Component Value Date   CHOLHDL 4 05/13/2019   Lab Results  Component Value Date   HGBA1C 5.9 05/13/2019       Assessment & Plan:   Problem List Items Addressed This Visit    Essential hypertension    Monitor and report any concerns, no changes to meds. Encouraged heart healthy diet such as the DASH diet and exercise as tolerated.       Relevant Medications   lisinopril (ZESTRIL) 5 MG tablet   Hyperglycemia     hgba1c acceptable, minimize simple carbs. Increase exercise as tolerated. Continue current meds      Toe infection    Right second toe. Responded some to Keflex but still has some swelling and redness still will try a course of Cefdinir and she will elevated. Apply mupirocin daily and keep it clean and dry. If no improvement she may need referral to wound management and if recurs may consider referral to ortho to evaluate foot.      Relevant Medications   cefdinir (OMNICEF) 300 MG capsule  I have discontinued Kyrra A. Eager's lisinopril and cephALEXin. I am also having her start on cefdinir and lisinopril. Additionally, I am having her maintain her Multiple Vitamins-Calcium (ONE-A-DAY WOMENS FORMULA PO), clobetasol, Digestive Advantage, docusate sodium, FIBER PO, potassium chloride SA, oxybutynin, mupirocin ointment, baclofen, rosuvastatin, and hydrochlorothiazide.  Meds ordered this encounter  Medications  . cefdinir (OMNICEF) 300 MG capsule    Sig: Take 1 capsule (300 mg total) by mouth 2 (two) times daily for 10 days.    Dispense:  20 capsule    Refill:  0  . lisinopril (ZESTRIL) 5 MG tablet    Sig: Take 1 tablet (5 mg total) by mouth daily.    Dispense:  90 tablet    Refill:  1   I discussed the assessment and treatment plan with the patient. The patient was provided an opportunity to ask questions and all were answered. The patient agreed with the plan and demonstrated an understanding of the instructions.   The patient was advised to call back or seek an in-person evaluation if the symptoms worsen or if the condition fails to improve as anticipated.  I provided 25 minutes of non-face-to-face time during this encounter.   Penni Homans, MD

## 2019-08-14 DIAGNOSIS — G4733 Obstructive sleep apnea (adult) (pediatric): Secondary | ICD-10-CM | POA: Diagnosis not present

## 2019-08-26 DIAGNOSIS — R32 Unspecified urinary incontinence: Secondary | ICD-10-CM | POA: Diagnosis not present

## 2019-08-26 DIAGNOSIS — Z933 Colostomy status: Secondary | ICD-10-CM | POA: Diagnosis not present

## 2019-08-26 DIAGNOSIS — G8250 Quadriplegia, unspecified: Secondary | ICD-10-CM | POA: Diagnosis not present

## 2019-08-30 DIAGNOSIS — Z933 Colostomy status: Secondary | ICD-10-CM | POA: Diagnosis not present

## 2019-08-30 DIAGNOSIS — R32 Unspecified urinary incontinence: Secondary | ICD-10-CM | POA: Diagnosis not present

## 2019-08-30 DIAGNOSIS — G8250 Quadriplegia, unspecified: Secondary | ICD-10-CM | POA: Diagnosis not present

## 2019-09-18 ENCOUNTER — Encounter: Payer: Self-pay | Admitting: Family Medicine

## 2019-09-19 ENCOUNTER — Telehealth: Payer: Self-pay | Admitting: Family Medicine

## 2019-09-19 NOTE — Telephone Encounter (Signed)
Pt called to say she found out today she was exposed to covid by one of her CMAs she has an appointment today at 12 at her local drug store. She states she will go read the mychart recommendations but sounds like she has already been doing most of it.

## 2019-09-21 ENCOUNTER — Encounter: Payer: Self-pay | Admitting: Family Medicine

## 2019-10-14 ENCOUNTER — Other Ambulatory Visit: Payer: Self-pay | Admitting: Family Medicine

## 2019-10-22 DIAGNOSIS — M19011 Primary osteoarthritis, right shoulder: Secondary | ICD-10-CM | POA: Diagnosis not present

## 2019-10-22 DIAGNOSIS — Z96611 Presence of right artificial shoulder joint: Secondary | ICD-10-CM | POA: Diagnosis not present

## 2019-10-28 DIAGNOSIS — G8250 Quadriplegia, unspecified: Secondary | ICD-10-CM | POA: Diagnosis not present

## 2019-10-28 DIAGNOSIS — Z933 Colostomy status: Secondary | ICD-10-CM | POA: Diagnosis not present

## 2019-10-28 DIAGNOSIS — R32 Unspecified urinary incontinence: Secondary | ICD-10-CM | POA: Diagnosis not present

## 2019-11-12 DIAGNOSIS — G4733 Obstructive sleep apnea (adult) (pediatric): Secondary | ICD-10-CM | POA: Diagnosis not present

## 2019-11-18 ENCOUNTER — Encounter: Payer: Self-pay | Admitting: Family Medicine

## 2019-11-18 ENCOUNTER — Other Ambulatory Visit: Payer: Self-pay

## 2019-11-18 ENCOUNTER — Ambulatory Visit (INDEPENDENT_AMBULATORY_CARE_PROVIDER_SITE_OTHER): Payer: Medicare PPO | Admitting: Family Medicine

## 2019-11-18 DIAGNOSIS — E782 Mixed hyperlipidemia: Secondary | ICD-10-CM | POA: Diagnosis not present

## 2019-11-18 DIAGNOSIS — I10 Essential (primary) hypertension: Secondary | ICD-10-CM | POA: Diagnosis not present

## 2019-11-18 DIAGNOSIS — L089 Local infection of the skin and subcutaneous tissue, unspecified: Secondary | ICD-10-CM | POA: Diagnosis not present

## 2019-11-18 DIAGNOSIS — R739 Hyperglycemia, unspecified: Secondary | ICD-10-CM | POA: Diagnosis not present

## 2019-11-18 DIAGNOSIS — L209 Atopic dermatitis, unspecified: Secondary | ICD-10-CM

## 2019-11-18 LAB — COMPREHENSIVE METABOLIC PANEL
ALT: 52 U/L — ABNORMAL HIGH (ref 0–35)
AST: 55 U/L — ABNORMAL HIGH (ref 0–37)
Albumin: 4.1 g/dL (ref 3.5–5.2)
Alkaline Phosphatase: 95 U/L (ref 39–117)
BUN: 7 mg/dL (ref 6–23)
CO2: 30 mEq/L (ref 19–32)
Calcium: 9.4 mg/dL (ref 8.4–10.5)
Chloride: 101 mEq/L (ref 96–112)
Creatinine, Ser: 0.41 mg/dL (ref 0.40–1.20)
GFR: 154.71 mL/min (ref >60.00)
Glucose, Bld: 116 mg/dL — ABNORMAL HIGH (ref 70–99)
Potassium: 4.2 mEq/L (ref 3.5–5.1)
Sodium: 138 mEq/L (ref 135–145)
Total Bilirubin: 0.5 mg/dL (ref 0.2–1.2)
Total Protein: 7.2 g/dL (ref 6.0–8.3)

## 2019-11-18 LAB — TSH: TSH: 2.15 u[IU]/mL (ref 0.35–4.50)

## 2019-11-18 LAB — LIPID PANEL
Cholesterol: 141 mg/dL (ref 0–200)
HDL: 38.5 mg/dL — ABNORMAL LOW (ref >39.00)
LDL Cholesterol: 73 mg/dL (ref 0–99)
NonHDL: 102.83
Total CHOL/HDL Ratio: 4
Triglycerides: 147 mg/dL (ref 0.0–149.0)
VLDL: 29.4 mg/dL (ref 0.0–40.0)

## 2019-11-18 LAB — CBC
HCT: 39.2 % (ref 36.0–46.0)
Hemoglobin: 12.9 g/dL (ref 12.0–15.0)
MCHC: 32.9 g/dL (ref 30.0–36.0)
MCV: 79.8 fl (ref 78.0–100.0)
Platelets: 253 10*3/uL (ref 150.0–400.0)
RBC: 4.91 Mil/uL (ref 3.87–5.11)
RDW: 16.2 % — ABNORMAL HIGH (ref 11.5–15.5)
WBC: 7.3 10*3/uL (ref 4.0–10.5)

## 2019-11-18 LAB — HEMOGLOBIN A1C: Hgb A1c MFr Bld: 7.3 % — ABNORMAL HIGH (ref 4.6–6.5)

## 2019-11-18 MED ORDER — LISINOPRIL 5 MG PO TABS: 5.0000 mg | ORAL_TABLET | Freq: Every day | ORAL | 1 refills | Status: DC

## 2019-11-18 MED ORDER — TRIAMCINOLONE ACETONIDE 0.1 % EX CREA: 1.0000 "application " | TOPICAL_CREAM | Freq: Two times a day (BID) | CUTANEOUS | 2 refills | Status: DC

## 2019-11-18 MED ORDER — BACLOFEN 20 MG PO TABS: ORAL_TABLET | ORAL | 3 refills | Status: DC

## 2019-11-18 NOTE — Assessment & Plan Note (Signed)
hgba1c acceptable, minimize simple carbs. Increase exercise as tolerated.  

## 2019-11-18 NOTE — Assessment & Plan Note (Signed)
Try triamcinolone cream to left arm for a mildly erythematous rash.

## 2019-11-18 NOTE — Assessment & Plan Note (Signed)
Encouraged heart healthy diet, increase exercise, avoid trans fats, consider a krill oil cap daily 

## 2019-11-18 NOTE — Patient Instructions (Signed)
Cleanse area with witch hazel astringent prior to applying the Triamcinolone cream. Notify us if no improvement and/or is worsens  Atopic Dermatitis Atopic dermatitis is a skin disorder that causes inflammation of the skin. This is the most common type of eczema. Eczema is a group of skin conditions that cause the skin to be itchy, red, and swollen. This condition is generally worse during the cooler winter months and often improves during the warm summer months. Symptoms can vary from person to person. Atopic dermatitis usually starts showing signs in infancy and can last through adulthood. This condition cannot be passed from one person to another (non-contagious), but it is more common in families. Atopic dermatitis may not always be present. When it is present, it is called a flare-up. What are the causes? The exact cause of this condition is not known. Flare-ups of the condition may be triggered by:  Contact with something that you are sensitive or allergic to.  Stress.  Certain foods.  Extremely hot or cold weather.  Harsh chemicals and soaps.  Dry air.  Chlorine. What increases the risk? This condition is more likely to develop in people who have a personal history or family history of eczema, allergies, asthma, or hay fever. What are the signs or symptoms? Symptoms of this condition include:  Dry, scaly skin.  Red, itchy rash.  Itchiness, which can be severe. This may occur before the skin rash. This can make sleeping difficult.  Skin thickening and cracking that can occur over time. How is this diagnosed? This condition is diagnosed based on your symptoms, a medical history, and a physical exam. How is this treated? There is no cure for this condition, but symptoms can usually be controlled. Treatment focuses on:  Controlling the itchiness and scratching. You may be given medicines, such as antihistamines or steroid creams.  Limiting exposure to things that you are  sensitive or allergic to (allergens).  Recognizing situations that cause stress and developing a plan to manage stress. If your atopic dermatitis does not get better with medicines, or if it is all over your body (widespread), a treatment using a specific type of light (phototherapy) may be used. Follow these instructions at home: Skin care   Keep your skin well-moisturized. Doing this seals in moisture and helps to prevent dryness. ? Use unscented lotions that have petroleum in them. ? Avoid lotions that contain alcohol or water. They can dry the skin.  Keep baths or showers short (less than 5 minutes) in warm water. Do not use hot water. ? Use mild, unscented cleansers for bathing. Avoid soap and bubble bath. ? Apply a moisturizer to your skin right after a bath or shower.  Do not apply anything to your skin without checking with your health care provider. General instructions  Dress in clothes made of cotton or cotton blends. Dress lightly because heat increases itchiness.  When washing your clothes, rinse your clothes twice so all of the soap is removed.  Avoid any triggers that can cause a flare-up.  Try to manage your stress.  Keep your fingernails cut short.  Avoid scratching. Scratching makes the rash and itchiness worse. It may also result in a skin infection (impetigo) due to a break in the skin caused by scratching.  Take or apply over-the-counter and prescription medicines only as told by your health care provider.  Keep all follow-up visits as told by your health care provider. This is important.  Do not be around people who have  cold sores or fever blisters. If you get the infection, it may cause your atopic dermatitis to worsen. Contact a health care provider if:  Your itchiness interferes with sleep.  Your rash gets worse or it is not better within one week of starting treatment.  You have a fever.  You have a rash flare-up after having contact with someone  who has cold sores or fever blisters. Get help right away if:  You develop pus or soft yellow scabs in the rash area. Summary  This condition causes a red rash and itchy, dry, scaly skin.  Treatment focuses on controlling the itchiness and scratching, limiting exposure to things that you are sensitive or allergic to (allergens), recognizing situations that cause stress, and developing a plan to manage stress.  Keep your skin well-moisturized.  Keep baths or showers shorter than 5 minutes and use warm water. Do not use hot water. This information is not intended to replace advice given to you by your health care provider. Make sure you discuss any questions you have with your health care provider. Document Revised: 09/18/2018 Document Reviewed: 07/01/2016 Elsevier Patient Education  [place].

## 2019-11-18 NOTE — Progress Notes (Signed)
Subjective:    Patient ID: [name], female    DOB: 1950-08-01, 69 y.o.   MRN: [id number]  Chief Complaint  Patient presents with  . Follow-up    HPI Patient is in today for follow up on chronic medical concerns. She has recovered from covid, she has had her first covid shot. No recent febrile illness or hospitalizations. She is noting a rash on left arm that is erythematous but not pruritic as she has on sensation. Denies CP/palp/SOB/HA/congestion/fevers/GI or GU c/o. Taking meds as prescribed  Past Medical History:  Diagnosis Date  . Acute bronchitis 05/16/2015  . Anemia    after shoulder replacement  . Autonomic dysreflexia   . AVM (arteriovenous malformation) spine 1981  . Bowel dysfunction    constipation and fecal retention  . Cerumen impaction 10/11/2015  . Complication of anesthesia    low temperature after general anesthesia for colostomy  . Constipation 01/25/2016  . Contusion 05/18/2014   Of foot  . Depression 02/13/2017   History of   . Headache 04/09/2017  . History of kidney stones   . History of neurogenic bowel   . History of rotator cuff tear    Right  . History of ventral hernia   . Hyperglycemia 05/18/2015  . Hyperlipidemia   . Hypertension    No medications at this time  . Incomplete quadriplegia at C5-6 level ([place]) 07/24/2008   Qualifier: Diagnosis of  By: Wynona Luna Requires bowel regimen including enemas and indwelling foley cath Has had tendency towards pressure sores in past. Had a tear with a transfer in Hospital in past for surgery and developed a sore at that time.   . Low blood pressure reading    Occassionally  . Medicare annual wellness visit, subsequent 11/02/2014   Follows with [facility] Declines colonoscopy  No h/o abnormal pap, declines further paps  . MVP (mitral valve prolapse) 12/01/2012   Mild  . Neurogenic bladder   . OA (osteoarthritis)    Hip and shoulders, neck  . Obesity 11/01/2016  . OSA on CPAP 01/31/2016  .  Pain in joint, shoulder region 12/01/2012   Long history B/l Follows with [facility] Hassell [facility]  . Postmenopausal bleeding 12/17/2012  . Preventative health care 06/26/2013  . Quadriplegia ([place]) 1981   Secondary to ruptured AVM C5-C6  . Sacral pressure ulcer 12/01/2012   H/o Follows with Dr Tomi Likens of Dermatology  . Skin cancer 11/01/2016   2 [facility] RLE excised by Dr Wilhemina Bonito 2018 [place] LLE excised by Dr Wilhemina Bonito 2018  . Vaginitis and vulvovaginitis 12/17/2012  . Wears glasses     Past Surgical History:  Procedure Laterality Date  . ANAL TAG REMOVAL  2014  . BASAL CELL CARCINOMA EXCISION  2016   Lip  . BASAL CELL CARCINOMA EXCISION  2018   right leg x2  . BASAL CELL CARCINOMA EXCISION Left 2018   leg, chest  . BASAL CELL CARCINOMA EXCISION Right 2019   leg  . BLADDER STONE REMOVAL  1986/87  . COLONOSCOPY  02/22/2016  . COLOSTOMY  02/22/2016  . CYSTOSCOPY WITH BIOPSY N/A 06/21/2018   Procedure: CYSTOSCOPY WITH BIOPSY FULGURATION;  Surgeon: Irine Seal, MD;  Location: [facility];  Service: Urology;  Laterality: N/A;  . CYSTOSCOPY WITH LITHOLAPAXY N/A 06/21/2018   Procedure: CYSTOSCOPY WITH LITHOLAPAXY;  Surgeon: Irine Seal, MD;  Location: [facility];  Service: Urology;  Laterality: N/A;  .  DORSAL CHEILECTOMY-HALLUX RIGIDUS Bilateral 2011  . HERNIA REPAIR  0175   VENTRAL umbilical hernia repair  . LAMINECTOMY  10/23/1979   AVM  . LAPAROSCOPIC SIGMOID COLECTOMY  02/22/2016  . [place]   left shoulder  . ROTATOR CUFF REPAIR Right 2001  . SHOULDER ARTHROSCOPY  2007   right  . SQUAMOUS CELL CARCINOMA EXCISION Left 2018   leg  . Toe Nails Removal Bilateral 1981   both great toes  . TOTAL SHOULDER REPLACEMENT  2001   left  . TRANSFER TENDON HAND  1982, 1983   to right hand X3  . VENTRAL HERNIA REPAIR  08/17/2010   Postop ileus (Dr. Redmond Pulling)    Family History  Problem Relation Age of Onset  . Cancer Mother         breast & melanoma  . Hypertension Mother   . Heart disease Father        CAD, MI, AFib  . Cancer Sister        melanoma  . Cancer Maternal Grandfather        melanoma  . Stroke Paternal Grandmother   . Heart disease Paternal Grandfather   . Cancer Maternal Aunt        breast  . Cancer Paternal Aunt        breast  . Cancer Paternal Uncle        breast  . Cancer Paternal Aunt        breast    Social History   Socioeconomic History  . Marital status: Single    Spouse name: Not on file  . Number of children: Not on file  . Years of education: Not on file  . Highest education level: Not on file  Occupational History  . Occupation: Disabled    Comment: Music therapist  Tobacco Use  . Smoking status: Never Smoker  . Smokeless tobacco: Never Used  Substance and Sexual Activity  . Alcohol use: No  . Drug use: No  . Sexual activity: Not on file    Comment: lives with sister and mother, no major dietary restrictions  Other Topics Concern  . Not on file  Social History Narrative   Sister is primary caregiver   Social Determinants of Health   Financial Resource Strain:   . Difficulty of Paying Living Expenses:   Food Insecurity:   . Worried About Charity fundraiser in the Last Year:   . Arboriculturist in the Last Year:   Transportation Needs:   . Film/video editor (Medical):   Marland Kitchen Lack of Transportation (Non-Medical):   Physical Activity:   . Days of Exercise per Week:   . Minutes of Exercise per Session:   Stress:   . Feeling of Stress :   Social Connections:   . Frequency of Communication with Friends and Family:   . Frequency of Social Gatherings with Friends and Family:   . Attends Religious Services:   . Active Member of Clubs or Organizations:   . Attends Archivist Meetings:   Marland Kitchen Marital Status:   Intimate Partner Violence:   . Fear of Current or Ex-Partner:   . Emotionally Abused:   Marland Kitchen Physically Abused:   . Sexually Abused:     Outpatient  Medications Prior to Visit  Medication Sig Dispense Refill  . clobetasol (TEMOVATE) 0.05 % external solution Apply 1 application topically every Monday, Wednesday, and Friday. Use on scalp 3 times per week    .  docusate sodium (COLACE) 100 MG capsule Take 1 tablet by mouth daily.    Marland Kitchen ELDERBERRY PO Take 2 each by mouth daily.    Marland Kitchen FIBER PO Take 1 capsule by mouth daily.    . hydrochlorothiazide (MICROZIDE) 12.5 MG capsule TAKE 1 CAPSULE BY MOUTH ONCE DAILY 30 capsule 3  . MELATONIN GUMMIES PO Take 5 mg by mouth at bedtime.    . Multiple Vitamins-Calcium (ONE-A-DAY WOMENS FORMULA PO) Take 1 tablet by mouth daily.      . mupirocin ointment (BACTROBAN) 2 % Place 1 application into the nose 2 (two) times daily. 22 g 1  . oxybutynin (DITROPAN) 5 MG tablet Take 1 tablet (5 mg total) by mouth every morning. 90 tablet 1  . Probiotic Product (DIGESTIVE ADVANTAGE) CAPS Take 1 tablet by mouth daily.    . rosuvastatin (CRESTOR) 5 MG tablet Take 1 tablet (5 mg total) by mouth daily. 90 tablet 3  . baclofen (LIORESAL) 20 MG tablet TAKE 1/2 TO 1 (10-20MG S) TABLET BY MOUTH4 TIMES DAILY AS NEEDED FOR MUSCLE SPASMS 90 each 3  . lisinopril (ZESTRIL) 5 MG tablet Take 1 tablet (5 mg total) by mouth daily. 90 tablet 1  . potassium chloride SA (K-DUR) 20 MEQ tablet Take 2 tablets (40 mEq total) by mouth 2 (two) times daily. 360 tablet 1   No facility-administered medications prior to visit.    Allergies  Allergen Reactions  . Adhesive [Tape] Other (See Comments)    Skin irritation Does not tolerate [employer], tolerates [place]    Review of Systems  Constitutional: Negative for fever and malaise/fatigue.  HENT: Negative for congestion.   Eyes: Negative for blurred vision.  Respiratory: Negative for shortness of breath.   Cardiovascular: Negative for chest pain, palpitations and leg swelling.  Gastrointestinal: Negative for abdominal pain, blood in stool and nausea.  Genitourinary: Negative for dysuria  and frequency.  Musculoskeletal: Negative for falls.  Skin: Positive for rash.  Neurological: Negative for dizziness, loss of consciousness and headaches.  Endo/Heme/Allergies: Negative for environmental allergies.  Psychiatric/Behavioral: Negative for depression. The patient is not nervous/anxious.        Objective:    Physical Exam Vitals and nursing note reviewed.  Constitutional:      General: She is not in acute distress.    Appearance: She is well-developed.  HENT:     Head: Normocephalic and atraumatic.     Nose: Nose normal.  Eyes:     General:        Right eye: No discharge.        Left eye: No discharge.  Cardiovascular:     Rate and Rhythm: Normal rate and regular rhythm.     Heart sounds: No murmur.  Pulmonary:     Effort: Pulmonary effort is normal.     Breath sounds: Normal breath sounds.  Abdominal:     General: Bowel sounds are normal.     Palpations: Abdomen is soft.     Tenderness: There is no abdominal tenderness.  Musculoskeletal:     Cervical back: Normal range of motion and neck supple.  Skin:    General: Skin is warm and dry.     Findings: Erythema and rash present.     Comments: Rash on flexor surface of left lower arm  Neurological:     Mental Status: She is alert and oriented to person, place, and time. Mental status is at baseline.     Comments: In motorized wheelchair     BP Marland Kitchen)  123/57 (BP Location: Left Arm, Cuff Size: Large)   Pulse 68   Temp 97.6 F (36.4 C) (Temporal)   Resp 12   SpO2 100%  Wt Readings from Last 3 Encounters:  06/18/19 225 lb (102.1 kg)  06/25/18 230 lb (104.3 kg)  06/21/18 230 lb (104.3 kg)    Diabetic Foot Exam - Simple   No data filed     Lab Results  Component Value Date   WBC 7.3 11/18/2019   HGB 12.9 11/18/2019   HCT 39.2 11/18/2019   PLT 253.0 11/18/2019   GLUCOSE 116 (H) 11/18/2019   CHOL 141 11/18/2019   TRIG 147.0 11/18/2019   HDL 38.50 (L) 11/18/2019   LDLDIRECT 125.0 06/25/2018    LDLCALC 73 11/18/2019   ALT 52 (H) 11/18/2019   AST 55 (H) 11/18/2019   NA 138 11/18/2019   K 4.2 11/18/2019   CL 101 11/18/2019   CREATININE 0.41 11/18/2019   BUN 7 11/18/2019   CO2 30 11/18/2019   TSH 2.15 11/18/2019   HGBA1C 7.3 (H) 11/18/2019    Lab Results  Component Value Date   TSH 2.15 11/18/2019   Lab Results  Component Value Date   WBC 7.3 11/18/2019   HGB 12.9 11/18/2019   HCT 39.2 11/18/2019   MCV 79.8 11/18/2019   PLT 253.0 11/18/2019   Lab Results  Component Value Date   NA 138 11/18/2019   K 4.2 11/18/2019   CO2 30 11/18/2019   GLUCOSE 116 (H) 11/18/2019   BUN 7 11/18/2019   CREATININE 0.41 11/18/2019   BILITOT 0.5 11/18/2019   ALKPHOS 95 11/18/2019   AST 55 (H) 11/18/2019   ALT 52 (H) 11/18/2019   PROT 7.2 11/18/2019   ALBUMIN 4.1 11/18/2019   CALCIUM 9.4 11/18/2019   ANIONGAP 6 06/21/2018   GFR 154.71 11/18/2019   Lab Results  Component Value Date   CHOL 141 11/18/2019   Lab Results  Component Value Date   HDL 38.50 (L) 11/18/2019   Lab Results  Component Value Date   LDLCALC 73 11/18/2019   Lab Results  Component Value Date   TRIG 147.0 11/18/2019   Lab Results  Component Value Date   CHOLHDL 4 11/18/2019   Lab Results  Component Value Date   HGBA1C 7.3 (H) 11/18/2019       Assessment & Plan:   Problem List Items Addressed This Visit    Hyperlipidemia    Encouraged heart healthy diet, increase exercise, avoid trans fats, consider a krill oil cap daily      Relevant Medications   lisinopril (ZESTRIL) 5 MG tablet   Other Relevant Orders   Lipid panel (Completed)   Essential hypertension    Well controlled, no changes to meds. Encouraged heart healthy diet such as the DASH diet and exercise as tolerated.       Relevant Medications   lisinopril (ZESTRIL) 5 MG tablet   Other Relevant Orders   CBC (Completed)   Comprehensive metabolic panel (Completed)   TSH (Completed)   Hyperglycemia    hgba1c acceptable,  minimize simple carbs. Increase exercise as tolerated.       Relevant Orders   Hemoglobin A1c (Completed)   Toe infection    Second toe right foot, resolved but still some redness on DIP joint      Atopic dermatitis    Try triamcinolone cream to left arm for a mildly erythematous rash.          I have discontinued Stanton Kidney  A. Pagett's potassium chloride SA. I am also having her start on triamcinolone cream. Additionally, I am having her maintain her Multiple Vitamins-Calcium (ONE-A-DAY WOMENS FORMULA PO), clobetasol, Digestive Advantage, docusate sodium, FIBER PO, oxybutynin, mupirocin ointment, rosuvastatin, hydrochlorothiazide, ELDERBERRY PO, MELATONIN GUMMIES PO, baclofen, and lisinopril.  Meds ordered this encounter  Medications  . baclofen (LIORESAL) 20 MG tablet    Sig: TAKE 1/2 TO 1 (10-20MG S) TABLET BY MOUTH4 TIMES DAILY AS NEEDED FOR MUSCLE SPASMS    Dispense:  90 each    Refill:  3  . lisinopril (ZESTRIL) 5 MG tablet    Sig: Take 1 tablet (5 mg total) by mouth daily.    Dispense:  90 tablet    Refill:  1  . triamcinolone cream (KENALOG) 0.1 %    Sig: Apply 1 application topically 2 (two) times daily.    Dispense:  80 g    Refill:  2     Penni Homans, MD

## 2019-11-18 NOTE — Assessment & Plan Note (Signed)
Second toe right foot, resolved but still some redness on DIP joint

## 2019-11-18 NOTE — Assessment & Plan Note (Signed)
Well controlled, no changes to meds. Encouraged heart healthy diet such as the DASH diet and exercise as tolerated.  °

## 2019-11-19 MED ORDER — METFORMIN HCL 500 MG PO TABS
500.0000 mg | ORAL_TABLET | Freq: Every day | ORAL | 3 refills | Status: DC
Start: 2019-11-19 — End: 2021-01-25

## 2019-11-19 NOTE — Addendum Note (Signed)
Addended byDamita Dunnings D on: 11/19/2019 09:50 AM   Modules accepted: Orders

## 2019-11-21 ENCOUNTER — Encounter: Payer: Self-pay | Admitting: Family Medicine

## 2019-11-25 DIAGNOSIS — M1990 Unspecified osteoarthritis, unspecified site: Secondary | ICD-10-CM | POA: Diagnosis not present

## 2019-11-25 DIAGNOSIS — G8254 Quadriplegia, C5-C7 incomplete: Secondary | ICD-10-CM | POA: Diagnosis not present

## 2019-11-25 DIAGNOSIS — M25519 Pain in unspecified shoulder: Secondary | ICD-10-CM | POA: Diagnosis not present

## 2019-11-28 DIAGNOSIS — G8250 Quadriplegia, unspecified: Secondary | ICD-10-CM | POA: Diagnosis not present

## 2019-11-28 DIAGNOSIS — Z933 Colostomy status: Secondary | ICD-10-CM | POA: Diagnosis not present

## 2019-11-28 DIAGNOSIS — R32 Unspecified urinary incontinence: Secondary | ICD-10-CM | POA: Diagnosis not present

## 2019-12-03 DIAGNOSIS — R32 Unspecified urinary incontinence: Secondary | ICD-10-CM | POA: Diagnosis not present

## 2019-12-03 DIAGNOSIS — Z933 Colostomy status: Secondary | ICD-10-CM | POA: Diagnosis not present

## 2019-12-03 DIAGNOSIS — G8250 Quadriplegia, unspecified: Secondary | ICD-10-CM | POA: Diagnosis not present

## 2019-12-10 ENCOUNTER — Telehealth: Payer: Self-pay

## 2019-12-10 DIAGNOSIS — G4733 Obstructive sleep apnea (adult) (pediatric): Secondary | ICD-10-CM | POA: Diagnosis not present

## 2019-12-10 NOTE — Telephone Encounter (Signed)
Left message on machine that paperwork has been faxed back.

## 2019-12-10 NOTE — Telephone Encounter (Signed)
Caller: Caren Griffins ([employer])       Call Back #: [phone]  Caren Griffins is calling to see if we received paperwork for patient's wheelchair repair

## 2019-12-11 DIAGNOSIS — L820 Inflamed seborrheic keratosis: Secondary | ICD-10-CM | POA: Diagnosis not present

## 2019-12-11 DIAGNOSIS — L821 Other seborrheic keratosis: Secondary | ICD-10-CM | POA: Diagnosis not present

## 2019-12-11 DIAGNOSIS — L570 Actinic keratosis: Secondary | ICD-10-CM | POA: Diagnosis not present

## 2019-12-11 DIAGNOSIS — Z85828 Personal history of other malignant neoplasm of skin: Secondary | ICD-10-CM | POA: Diagnosis not present

## 2019-12-11 DIAGNOSIS — L718 Other rosacea: Secondary | ICD-10-CM | POA: Diagnosis not present

## 2019-12-11 DIAGNOSIS — D485 Neoplasm of uncertain behavior of skin: Secondary | ICD-10-CM | POA: Diagnosis not present

## 2019-12-11 NOTE — Telephone Encounter (Signed)
Caller: Caren Griffins ([employer]) Call back phone number: [phone]  Caren Griffins calls back to let you know she has not received paperwork. Can you please re-fax to [phone].

## 2019-12-12 NOTE — Telephone Encounter (Signed)
Paperwork has been faxed and Caren Griffins notified.

## 2019-12-30 ENCOUNTER — Other Ambulatory Visit: Payer: Self-pay

## 2019-12-30 ENCOUNTER — Ambulatory Visit: Payer: Medicare PPO | Attending: Orthopaedic Surgery

## 2019-12-30 DIAGNOSIS — R29898 Other symptoms and signs involving the musculoskeletal system: Secondary | ICD-10-CM | POA: Diagnosis not present

## 2019-12-30 DIAGNOSIS — M25611 Stiffness of right shoulder, not elsewhere classified: Secondary | ICD-10-CM | POA: Insufficient documentation

## 2019-12-30 DIAGNOSIS — R32 Unspecified urinary incontinence: Secondary | ICD-10-CM | POA: Diagnosis not present

## 2019-12-30 DIAGNOSIS — Z96611 Presence of right artificial shoulder joint: Secondary | ICD-10-CM | POA: Insufficient documentation

## 2019-12-30 DIAGNOSIS — M19011 Primary osteoarthritis, right shoulder: Secondary | ICD-10-CM

## 2019-12-30 DIAGNOSIS — Z933 Colostomy status: Secondary | ICD-10-CM | POA: Diagnosis not present

## 2019-12-30 DIAGNOSIS — G8250 Quadriplegia, unspecified: Secondary | ICD-10-CM | POA: Diagnosis not present

## 2019-12-31 DIAGNOSIS — M25519 Pain in unspecified shoulder: Secondary | ICD-10-CM | POA: Diagnosis not present

## 2019-12-31 DIAGNOSIS — M1990 Unspecified osteoarthritis, unspecified site: Secondary | ICD-10-CM | POA: Diagnosis not present

## 2019-12-31 DIAGNOSIS — G8254 Quadriplegia, C5-C7 incomplete: Secondary | ICD-10-CM | POA: Diagnosis not present

## 2020-01-01 NOTE — Therapy (Signed)
[place], [place], [address] Phone: [phone]   Fax:  [phone]  Physical Therapy Evaluation  Patient Details  Name: [name] MRN: [id number] Date of Birth: 1950/06/28 Referring Provider (PT): Douglass Rivers, MD   Encounter Date: 12/30/2019   PT End of Session - 01/01/20 0606    Visit Number 1    Number of Visits 9    Date for PT Re-Evaluation 03/04/20    Authorization Type HUMANA MEDICARE CHOICE PPO; MEDICAID OF [place]    PT Start Time 1537    PT Stop Time 1622    PT Time Calculation (min) 45 min    Activity Tolerance Patient tolerated treatment well    Behavior During Therapy [facility] for tasks assessed/performed           Past Medical History:  Diagnosis Date  . Acute bronchitis 05/16/2015  . Anemia    after shoulder replacement  . Autonomic dysreflexia   . AVM (arteriovenous malformation) spine 1981  . Bowel dysfunction    constipation and fecal retention  . Cerumen impaction 10/11/2015  . Complication of anesthesia    low temperature after general anesthesia for colostomy  . Constipation 01/25/2016  . Contusion 05/18/2014   Of foot  . Depression 02/13/2017   History of   . Headache 04/09/2017  . History of kidney stones   . History of neurogenic bowel   . History of rotator cuff tear    Right  . History of ventral hernia   . Hyperglycemia 05/18/2015  . Hyperlipidemia   . Hypertension    No medications at this time  . Incomplete quadriplegia at C5-6 level () 07/24/2008   Qualifier: Diagnosis of  By: Wynona Luna Requires bowel regimen including enemas and indwelling foley cath Has had tendency towards pressure sores in past. Had a tear with a transfer in Hospital in past for surgery and developed a sore at that time.   . Low blood pressure reading    Occassionally  . Medicare annual wellness visit, subsequent 11/02/2014   Follows with [facility] Declines colonoscopy  No h/o  abnormal pap, declines further paps  . MVP (mitral valve prolapse) 12/01/2012   Mild  . Neurogenic bladder   . OA (osteoarthritis)    Hip and shoulders, neck  . Obesity 11/01/2016  . OSA on CPAP 01/31/2016  . Pain in joint, shoulder region 12/01/2012   Long history B/l Follows with Dr Hassell Done at [facility]  . Postmenopausal bleeding 12/17/2012  . Preventative health care 06/26/2013  . Quadriplegia ([place]) 1981   Secondary to ruptured AVM C5-C6  . Sacral pressure ulcer 12/01/2012   H/o Follows with Dr Tomi Likens of Dermatology  . Skin cancer 11/01/2016   2 BCC RLE excised by Dr Wilhemina Bonito 2018 [place] LLE excised by Dr Wilhemina Bonito 2018  . Vaginitis and vulvovaginitis 12/17/2012  . Wears glasses     Past Surgical History:  Procedure Laterality Date  . ANAL TAG REMOVAL  2014  . BASAL CELL CARCINOMA EXCISION  2016   Lip  . BASAL CELL CARCINOMA EXCISION  2018   right leg x2  . BASAL CELL CARCINOMA EXCISION Left 2018   leg, chest  . BASAL CELL CARCINOMA EXCISION Right 2019   leg  . BLADDER STONE REMOVAL  1986/87  . COLONOSCOPY  02/22/2016  . COLOSTOMY  02/22/2016  . CYSTOSCOPY WITH BIOPSY N/A 06/21/2018   Procedure: CYSTOSCOPY WITH BIOPSY  FULGURATION;  Surgeon: Irine Seal, MD;  Location[facility];  Service: Urology;  Laterality: N/A;  . CYSTOSCOPY WITH LITHOLAPAXY N/A 06/21/2018   Procedure: CYSTOSCOPY WITH LITHOLAPAXY;  Surgeon: Irine Seal, MD;  Location: [facility];  Service: Urology;  Laterality: N/A;  . DORSAL CHEILECTOMY-HALLUX RIGIDUS Bilateral 2011  . HERNIA REPAIR  [id number]   VENTRAL umbilical hernia repair  . LAMINECTOMY  10/23/1979   AVM  . LAPAROSCOPIC SIGMOID COLECTOMY  02/22/2016  . [place]   left shoulder  . ROTATOR CUFF REPAIR Right 2001  . SHOULDER ARTHROSCOPY  2007   right  . SQUAMOUS CELL CARCINOMA EXCISION Left 2018   leg  . Toe Nails Removal Bilateral 1981   both great toes  . TOTAL SHOULDER REPLACEMENT   2001   left  . TRANSFER TENDON HAND  1982, 1983   to right hand X3  . VENTRAL HERNIA REPAIR  08/17/2010   Postop ileus (Dr. Redmond Pulling)    There were no vitals filed for this visit.    Subjective Assessment - 01/01/20 0608    Subjective Pt reports she has in home care in the morning and evening. Her older sister assists with care, but is limited due to her health and strength. her sister uses a walker for mobility. Pt expressed concern re: a hernia which she does not want to wrosen by over straining. Currently, she reports her R shoulder fatigues after driving only shorter distances.    Pertinent History Incomplete quadriplegia at C5-6 level (1981), L TSR 21 years ago, colostomy, indwelling urine catheter    Limitations Lifting;House hold activities;Other (comment)   driving tolerance   How long can you sit comfortably? No an issue; Tilt in space WC    How long can you stand comfortably? NA    How long can you walk comfortably? NA    Diagnostic tests R-TSR s osteolysis; moderate AC jt degeration    Patient Stated Goals Pt reports she wants to strengthen her R arm to improve her driving tolerance, to assist better with her bed mobility, and to protect her L TSR which she has had for 21 years.    Currently in Pain? No/denies   has R Sh pain at night             [facility] PT Assessment - 01/01/20 0001      Assessment   Medical Diagnosis Primary osteoarthritis, right shoulder; R-TSA, R    Referring Provider (PT) Douglass Rivers, MD    Onset Date/Surgical Date 03/17/19    Hand Dominance Right    Prior Therapy Home health      Precautions   Precautions None    Precaution Comments --      Restrictions   Weight Bearing Restrictions No      Balance Screen   Has the patient fallen in the past 6 months No    Has the patient had a decrease in activity level because of a fear of falling?  No    Is the patient reluctant to leave their home because of a fear of falling?  No      Home  Environment   Living Environment Private residence    Living Arrangements Other relatives    Home Access Ramped entrance    [place] One level    Home Equipment Wheelchair - power;Adaptive equipment;Other (comment)    Additional Comments overbed lift, mechanical lift, roll in shower, mobility loops over bed  Prior Function   Vocation On disability      Cognition   Overall Cognitive Status Within Functional Limits for tasks assessed      Observation/Other Assessments   Focus on Therapeutic Outcomes (FOTO)  NA      Coordination   Gross Motor Movements are Fluid and Coordinated No   Incomplete quadriplegia at C5-6 level; decreased use of UEs   Fine Motor Movements are Fluid and Coordinated No   Incomplete quadriplegia.Claw hands bilat,no finger dexterity     Posture/Postural Control   Posture/Postural Control Postural limitations    Postural Limitations Rounded Shoulders;Forward head;Decreased lumbar lordosis;Decreased thoracic kyphosis      ROM / Strength   AROM / PROM / Strength AROM;Strength;PROM      AROM   AROM Assessment Site Shoulder    Right/Left Shoulder Right    Right Shoulder Flexion 95 Degrees    Right Shoulder ABduction 90 Degrees    Right Shoulder External Rotation 10 Degrees      PROM   PROM Assessment Site Shoulder    Right/Left Shoulder Right    Right Shoulder Flexion 110 Degrees    Right Shoulder ABduction 105 Degrees    Right Shoulder External Rotation 10 Degrees      Strength   Strength Assessment Site Shoulder;Elbow    Right/Left Shoulder Right    Right Shoulder Flexion 3+/5    Right Shoulder Extension 3/5    Right Shoulder ABduction 3+/5    Right Shoulder Internal Rotation 3/5    Right Shoulder External Rotation 3/5    Right/Left Elbow Right    Right Elbow Flexion 4+/5    Right Elbow Extension 4+/5      Bed Mobility   Bed Mobility --   Over bed lift and assist loops     Transfers   Transfers --   mechanical lift     Wheelchair  Mobility   Wheelchair Mobility --   Tilt in space power WC     Balance   Balance Assessed --   L UE assist for counter balance with lifting R UE                     Objective measurements completed on examination: See above findings.               PT Education - 01/01/20 0605    Education Details Eval findings, POC, HEP c proper breathing to avoid valsalva    Person(s) Educated Patient    Methods Explanation;Demonstration;Tactile cues;Verbal cues;Handout    Comprehension Verbalized understanding;Returned demonstration;Verbal cues required;Tactile cues required;Need further instruction            PT Short Term Goals - 01/01/20 1312      PT SHORT TERM GOAL #1   Title Pt will be Ind in a HEP    Baseline Started today    Time 3    Period Weeks    Status New    Target Date 01/22/20             PT Long Term Goals - 01/01/20 1314      PT LONG TERM GOAL #1   Title Improved R shoulder strength to 4/5 or greater    Baseline 3 -3+/5    Time 9    Period Weeks    Status New    Target Date 03/04/20      PT LONG TERM GOAL #2   Title Improve R shoulder AROM to flexion= 105d,  abd =100d    Baseline Flexion=95d; abd =90d    Time 9    Period Weeks    Status New    Target Date 03/04/20      PT LONG TERM GOAL #3   Title Pt will toleratedthe UBE of 10 mins at level 2 to demonstrate improve tolerance of the R shoulder    Baseline NT    Time 9    Period Weeks    Status New    Target Date 03/04/20      PT LONG TERM GOAL #4   Title Pt will report improved tolerance of her R arm with driving to improve safety.    Baseline Decreased tolerance    Time 9    Period Weeks    Status New    Target Date 03/04/20      PT LONG TERM GOAL #5   Title Pt will report improved use of her R arm with bed mobility for needed changes in position at night for comfort and pressure relief.    Baseline Unable to complete position changes    Time 9    Period Weeks     Status New    Target Date 03/04/20      Additional Long Term Goals   Additional Long Term Goals Yes      PT LONG TERM GOAL #6   Title Pt will be Ind in a final HEP    Time 9    Period Weeks    Status New    Target Date 03/04/20                  Plan - 01/01/20 0553    Clinical Impression Statement Pt presents to PT 10 months s/p a R R-TSR. Pt had home health rehab initially after surgery for a month or so, but none since. The R shoulder has min to mod deficits of strength and ROM. 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 Currently, she stays in the same position all night.    Personal Factors and Comorbidities Fitness;Comorbidity 3+;Past/Current Experience    Comorbidities Incomplete quadriplegia at C5-6 level (1981); Mitral valve disorder, depression, obesity    Examination-Activity Limitations Reach Overhead;Lift;Other   Driving   Stability/Clinical Decision Making Evolving/Moderate complexity    Clinical Decision Making Moderate    Rehab Potential Good    PT Frequency 1x / week    PT Duration 8 weeks    PT Treatment/Interventions ADLs/Self Care Home Management;Electrical Stimulation;Iontophoresis 4mg /ml Dexamethasone;Moist Heat;Ultrasound;Neuromuscular re-education;Balance training;Therapeutic exercise;Therapeutic activities;Patient/family education;Manual techniques;Taping;Dry needling;Passive range of motion    PT Next Visit Plan Assess response to HEP. Continue development of ther ex/HEP to improve R shoulder strength and ROM    PT Home Exercise Plan [email or web address]    Consulted and Agree with Plan of Care Patient           Patient will benefit from skilled therapeutic intervention in order to improve the following deficits and impairments:  Decreased range of motion, Obesity, Decreased activity tolerance, Decreased  strength, Decreased mobility, Decreased balance  Visit Diagnosis: Primary osteoarthritis, right shoulder  Status post reverse total shoulder replacement, right  Decreased ROM of right shoulder  Weakness of shoulder     Problem List Patient Active Problem List   Diagnosis Date Noted  . Atopic dermatitis 11/18/2019  .  Toe infection 08/12/2019  . Vaginal bleeding 07/18/2019  . Kidney stone 06/25/2018  . Colostomy status ([place]) 02/19/2018  . Right knee pain 04/09/2017  . Right hip pain 04/09/2017  . Headache 04/09/2017  . Depression 02/13/2017  . Obesity 11/01/2016  . Sleep apnea 01/31/2016  . Constipation 01/25/2016  . Hyperglycemia 05/18/2015  . Medicare annual wellness visit, subsequent 11/02/2014  . Preventative health care 06/26/2013  . Postmenopausal bleeding 12/17/2012  . Neurogenic bladder 12/01/2012  . Right shoulder pain 12/01/2012  . Mitral valve disorder 12/01/2012  . Rhinitis 01/03/2012  . CALLUS, RIGHT FOOT 10/30/2009  . ALOPECIA 10/06/2009  . INSOMNIA 08/04/2009  . Anemia 08/28/2008  . Hyperlipidemia 07/27/2008  . Essential hypertension 07/27/2008  . Incomplete quadriplegia at C5-6 level ([place]) 07/24/2008   Gar Ponto MS, PT 01/01/20 1:34 PM  [facility] [address] [place], [place], [address] Phone: [phone]   Fax:  [phone]  Name: [name] MRN: [id number] Date of Birth: 11-Apr-1951

## 2020-01-10 ENCOUNTER — Other Ambulatory Visit: Payer: Self-pay

## 2020-01-10 ENCOUNTER — Ambulatory Visit: Payer: Medicare PPO

## 2020-01-10 DIAGNOSIS — Z96611 Presence of right artificial shoulder joint: Secondary | ICD-10-CM

## 2020-01-10 DIAGNOSIS — M19011 Primary osteoarthritis, right shoulder: Secondary | ICD-10-CM | POA: Diagnosis not present

## 2020-01-10 DIAGNOSIS — R29898 Other symptoms and signs involving the musculoskeletal system: Secondary | ICD-10-CM | POA: Diagnosis not present

## 2020-01-10 DIAGNOSIS — M25611 Stiffness of right shoulder, not elsewhere classified: Secondary | ICD-10-CM

## 2020-01-10 NOTE — Therapy (Signed)
[place], [place], [address] Phone: [phone]   Fax:  [phone]  Physical Therapy Treatment  Patient Details  Name: [name] MRN: [id number] Date of Birth: 1950/10/03 Referring Provider (PT): Douglass Rivers, MD   Encounter Date: 01/10/2020   PT End of Session - 01/10/20 1455    Visit Number 2    Number of Visits 9    Date for PT Re-Evaluation 03/04/20    Authorization Type HUMANA MEDICARE CHOICE PPO; MEDICAID OF [place]    PT Start Time 1220    PT Stop Time 1310    PT Time Calculation (min) 50 min    Activity Tolerance Patient tolerated treatment well    Behavior During Therapy [facility] for tasks assessed/performed           Past Medical History:  Diagnosis Date  . Acute bronchitis 05/16/2015  . Anemia    after shoulder replacement  . Autonomic dysreflexia   . AVM (arteriovenous malformation) spine 1981  . Bowel dysfunction    constipation and fecal retention  . Cerumen impaction 10/11/2015  . Complication of anesthesia    low temperature after general anesthesia for colostomy  . Constipation 01/25/2016  . Contusion 05/18/2014   Of foot  . Depression 02/13/2017   History of   . Headache 04/09/2017  . History of kidney stones   . History of neurogenic bowel   . History of rotator cuff tear    Right  . History of ventral hernia   . Hyperglycemia 05/18/2015  . Hyperlipidemia   . Hypertension    No medications at this time  . Incomplete quadriplegia at C5-6 level ([place]) 07/24/2008   Qualifier: Diagnosis of  By: Wynona Luna Requires bowel regimen including enemas and indwelling foley cath Has had tendency towards pressure sores in past. Had a tear with a transfer in Hospital in past for surgery and developed a sore at that time.   . Low blood pressure reading    Occassionally  . Medicare annual wellness visit, subsequent 11/02/2014   Follows with [facility] Declines colonoscopy  No h/o  abnormal pap, declines further paps  . MVP (mitral valve prolapse) 12/01/2012   Mild  . Neurogenic bladder   . OA (osteoarthritis)    Hip and shoulders, neck  . Obesity 11/01/2016  . OSA on CPAP 01/31/2016  . Pain in joint, shoulder region 12/01/2012   Long history B/l Follows with Dr Hassell Done at [facility]  . Postmenopausal bleeding 12/17/2012  . Preventative health care 06/26/2013  . Quadriplegia ([place]) 1981   Secondary to ruptured AVM C5-C6  . Sacral pressure ulcer 12/01/2012   H/o Follows with Dr Tomi Likens of Dermatology  . Skin cancer 11/01/2016   2 BCC RLE excised by Dr Wilhemina Bonito 2018 [place] LLE excised by Dr Wilhemina Bonito 2018  . Vaginitis and vulvovaginitis 12/17/2012  . Wears glasses     Past Surgical History:  Procedure Laterality Date  . ANAL TAG REMOVAL  2014  . BASAL CELL CARCINOMA EXCISION  2016   Lip  . BASAL CELL CARCINOMA EXCISION  2018   right leg x2  . BASAL CELL CARCINOMA EXCISION Left 2018   leg, chest  . BASAL CELL CARCINOMA EXCISION Right 2019   leg  . BLADDER STONE REMOVAL  1986/87  . COLONOSCOPY  02/22/2016  . COLOSTOMY  02/22/2016  . CYSTOSCOPY WITH BIOPSY N/A 06/21/2018   Procedure: CYSTOSCOPY WITH BIOPSY  FULGURATION;  Surgeon: Irine Seal, MD;  Location: [facility];  Service: Urology;  Laterality: N/A;  . CYSTOSCOPY WITH LITHOLAPAXY N/A 06/21/2018   Procedure: CYSTOSCOPY WITH LITHOLAPAXY;  Surgeon: Irine Seal, MD;  Location: [facility];  Service: Urology;  Laterality: N/A;  . DORSAL CHEILECTOMY-HALLUX RIGIDUS Bilateral 2011  . HERNIA REPAIR  4970   VENTRAL umbilical hernia repair  . LAMINECTOMY  10/23/1979   AVM  . LAPAROSCOPIC SIGMOID COLECTOMY  02/22/2016  . [place]   left shoulder  . ROTATOR CUFF REPAIR Right 2001  . SHOULDER ARTHROSCOPY  2007   right  . SQUAMOUS CELL CARCINOMA EXCISION Left 2018   leg  . Toe Nails Removal Bilateral 1981   both great toes  . TOTAL SHOULDER REPLACEMENT   2001   left  . TRANSFER TENDON HAND  1982, 1983   to right hand X3  . VENTRAL HERNIA REPAIR  08/17/2010   Postop ileus (Dr. Redmond Pulling)    There were no vitals filed for this visit.   Subjective Assessment - 01/10/20 1448    Subjective Pt reports R low back discomfort which happens from time to time with R UE reaching and leaning forward with her trunk.    Pertinent History Incomplete quadriplegia at C5-6 level (1981), L TSR 21 years ago, colostomy, indwelling urine catheter    Limitations Lifting;House hold activities;Other (comment)    Patient Stated Goals Pt reports she wants to strengthen her R arm to improve her driving tolerance, to assist better with her bed mobility, and to protect her L TSR which she has had for 21 years.    Currently in Pain? No/denies                             [facility] Adult PT Treatment/Exercise - 01/10/20 0001      Self-Care   Self-Care Other Self-Care Comments    Other Self-Care Comments  Use of tennis balll for low back muscle massage. Use of small thickness towel roll for gentle trunk ext mobilization      Exercises   Exercises Shoulder      Shoulder Exercises: Seated   Retraction Strengthening;10 reps    External Rotation Strengthening;10 reps    External Rotation Weight (lbs) none    External Rotation Limitations 3 sets; elbow resting on side table    Flexion Strengthening;Right;10 reps    Flexion Weight (lbs) 1 lb    Flexion Limitations 3 sets    Abduction Strengthening;Right;10 reps    ABduction Weight (lbs) 1 lb    ABduction Limitations 3 sets      Shoulder Exercises: ROM/Strengthening   Other ROM/Strengthening Exercises Horizonal abduction active stretc 5x,15 sec    Other ROM/Strengthening Exercises Shoulder IR active stretch 5x, 15 sec                  PT Education - 01/10/20 1451    Education Details HEP; Use of tennis balll for low back muscle massage. Use of small thickness towel roll for gentle trunk ext  mobilization. Use of heating pad for back pain.    Person(s) Educated Patient    Methods Explanation;Tactile cues;Demonstration;Verbal cues;Handout    Comprehension Verbalized understanding;Returned demonstration;Verbal cues required;Tactile cues required;Need further instruction            PT Short Term Goals - 01/01/20 1312      PT SHORT TERM GOAL #1  Title Pt will be Ind in a HEP    Baseline Started today    Time 3    Period Weeks    Status New    Target Date 01/22/20             PT Long Term Goals - 01/01/20 1314      PT LONG TERM GOAL #1   Title Improved R shoulder strength to 4/5 or greater    Baseline 3 -3+/5    Time 9    Period Weeks    Status New    Target Date 03/04/20      PT LONG TERM GOAL #2   Title Improve R shoulder AROM to flexion= 105d, abd =100d    Baseline Flexion=95d; abd =90d    Time 9    Period Weeks    Status New    Target Date 03/04/20      PT LONG TERM GOAL #3   Title Pt will toleratedthe UBE of 10 mins at level 2 to demonstrate improve tolerance of the R shoulder    Baseline NT    Time 9    Period Weeks    Status New    Target Date 03/04/20      PT LONG TERM GOAL #4   Title Pt will report improved tolerance of her R arm with driving to improve safety.    Baseline Decreased tolerance    Time 9    Period Weeks    Status New    Target Date 03/04/20      PT LONG TERM GOAL #5   Title Pt will report improved use of her R arm with bed mobility for needed changes in position at night for comfort and pressure relief.    Baseline Unable to complete position changes    Time 9    Period Weeks    Status New    Target Date 03/04/20      Additional Long Term Goals   Additional Long Term Goals Yes      PT LONG TERM GOAL #6   Title Pt will be Ind in a final HEP    Time 9    Period Weeks    Status New    Target Date 03/04/20                 Plan - 01/10/20 1456    Clinical Impression Statement PT focused on measures to  help reduce/manage intermittent R LBP, and ROM and strengthening for the R shoulder. Pt's R shoulder ER actively is less than passively indicating opportunity to improve AROM for this motion. Pt participated well in the PT session, and report R shoulder fatigue at the end of session.    Personal Factors and Comorbidities Fitness;Comorbidity 3+;Past/Current Experience;Comorbidity 1    Comorbidities Incomplete quadriplegia at C5-6 level (1981); Mitral valve disorder, depression, obesity    Examination-Activity Limitations Reach Overhead;Lift;Other    Stability/Clinical Decision Making Evolving/Moderate complexity    Clinical Decision Making Moderate    Rehab Potential Good    PT Frequency 1x / week    PT Duration 8 weeks    PT Treatment/Interventions ADLs/Self Care Home Management;Electrical Stimulation;Iontophoresis 4mg /ml Dexamethasone;Moist Heat;Ultrasound;Neuromuscular re-education;Balance training;Therapeutic exercise;Therapeutic activities;Patient/family education;Manual techniques;Taping;Dry needling;Passive range of motion    PT Next Visit Plan Assess response to HEP. Continue development of ther ex/HEP to improve R shoulder strength and ROM    PT Home Exercise Plan [email or web address]    Consulted and Agree with Plan of Care  Patient           Patient will benefit from skilled therapeutic intervention in order to improve the following deficits and impairments:  Decreased range of motion, Obesity, Decreased activity tolerance, Decreased strength, Decreased mobility, Decreased balance  Visit Diagnosis: Primary osteoarthritis, right shoulder  Status post reverse total shoulder replacement, right  Decreased ROM of right shoulder  Weakness of shoulder     Problem List Patient Active Problem List   Diagnosis Date Noted  . Atopic dermatitis 11/18/2019  . Toe infection 08/12/2019  . Vaginal bleeding 07/18/2019  . Kidney stone 06/25/2018  . Colostomy status ([place]) 02/19/2018  . Right knee  pain 04/09/2017  . Right hip pain 04/09/2017  . Headache 04/09/2017  . Depression 02/13/2017  . Obesity 11/01/2016  . Sleep apnea 01/31/2016  . Constipation 01/25/2016  . Hyperglycemia 05/18/2015  . Medicare annual wellness visit, subsequent 11/02/2014  . Preventative health care 06/26/2013  . Postmenopausal bleeding 12/17/2012  . Neurogenic bladder 12/01/2012  . Right shoulder pain 12/01/2012  . Mitral valve disorder 12/01/2012  . Rhinitis 01/03/2012  . CALLUS, RIGHT FOOT 10/30/2009  . ALOPECIA 10/06/2009  . INSOMNIA 08/04/2009  . Anemia 08/28/2008  . Hyperlipidemia 07/27/2008  . Essential hypertension 07/27/2008  . Incomplete quadriplegia at C5-6 level ([place]) 07/24/2008   Gar Ponto MS, PT 01/10/20 3:26 PM  [place] [facility] [address] [place], [place], [address] Phone: [phone]   Fax:  [phone]  Name: [name] MRN: [id number] Date of Birth: 1951-06-12

## 2020-01-13 ENCOUNTER — Other Ambulatory Visit: Payer: Self-pay

## 2020-01-13 ENCOUNTER — Ambulatory Visit: Payer: Medicare PPO | Attending: Orthopaedic Surgery | Admitting: Physical Therapy

## 2020-01-13 DIAGNOSIS — Z96611 Presence of right artificial shoulder joint: Secondary | ICD-10-CM | POA: Insufficient documentation

## 2020-01-13 DIAGNOSIS — M19011 Primary osteoarthritis, right shoulder: Secondary | ICD-10-CM | POA: Diagnosis not present

## 2020-01-13 DIAGNOSIS — R29898 Other symptoms and signs involving the musculoskeletal system: Secondary | ICD-10-CM | POA: Diagnosis not present

## 2020-01-13 DIAGNOSIS — M25611 Stiffness of right shoulder, not elsewhere classified: Secondary | ICD-10-CM | POA: Diagnosis not present

## 2020-01-13 NOTE — Therapy (Signed)
[place], [place], [address] Phone: [phone]   Fax:  [phone]  Physical Therapy Treatment  Patient Details  Name: [name] MRN: [id number] Date of Birth: [birth year] Referring Provider (PT): Douglass Rivers, MD   Encounter Date: 01/13/2020   PT End of Session - 01/13/20 1418    Visit Number 3    Number of Visits 9    Date for PT Re-Evaluation 03/04/20    Authorization Type HU[employer] MEDICARE CHOICE PPO; MEDICAID OF [place]    PT Start Time 5573    PT Stop Time 1458    PT Time Calculation (min) 41 min    Activity Tolerance Patient tolerated treatment well    Behavior During Therapy [facility] for tasks assessed/performed           Past Medical History:  Diagnosis Date  . Acute bronchitis 05/16/2015  . Anemia    after shoulder replacement  . Autonomic dysreflexia   . AVM (arteriovenous malformation) spine 1981  . Bowel dysfunction    constipation and fecal retention  . Cerumen impaction 10/11/2015  . Complication of anesthesia    low temperature after general anesthesia for colostomy  . Constipation 01/25/2016  . Contusion 05/18/2014   Of foot  . Depression 02/13/2017   History of   . Headache 04/09/2017  . History of kidney stones   . History of neurogenic bowel   . History of rotator cuff tear    Right  . History of ventral hernia   . Hyperglycemia 05/18/2015  . Hyperlipidemia   . Hypertension    No medications at this time  . Incomplete quadriplegia at C5-6 level ([place]) 07/24/2008   Qualifier: Diagnosis of  By: Wynona Luna Requires bowel regimen including enemas and indwelling foley cath Has had tendency towards pressure sores in past. Had a tear with a transfer in Hospital in past for surgery and developed a sore at that time.   . Low blood pressure reading    Occassionally  . Medicare annual wellness visit, subsequent 11/02/2014   Follows with [facility] Declines colonoscopy  No h/o  abnormal pap, declines further paps  . MVP (mitral valve prolapse) 12/01/2012   Mild  . Neurogenic bladder   . OA (osteoarthritis)    Hip and shoulders, neck  . Obesity 11/01/2016  . OSA on CPAP 01/31/2016  . Pain in joint, shoulder region 12/01/2012   Long history B/l Follows with Dr Hassell Done at [facility]  . Postmenopausal bleeding 12/17/2012  . Preventative health care 06/26/2013  . Quadriplegia ([place]) 1981   Secondary to ruptured AVM C5-C6  . Sacral pressure ulcer 12/01/2012   H/o Follows with Dr Tomi Likens of Dermatology  . Skin cancer 11/01/2016   2 BCC RLE excised by Dr Wilhemina Bonito 2018 [place] LLE excised by Dr Wilhemina Bonito 2018  . Vaginitis and vulvovaginitis 12/17/2012  . Wears glasses     Past Surgical History:  Procedure Laterality Date  . ANAL TAG REMOVAL  2014  . BASAL CELL CARCINOMA EXCISION  2016   Lip  . BASAL CELL CARCINOMA EXCISION  2018   right leg x2  . BASAL CELL CARCINOMA EXCISION Left 2018   leg, chest  . BASAL CELL CARCINOMA EXCISION Right 2019   leg  . BLADDER STONE REMOVAL  1986/87  . COLONOSCOPY  02/22/2016  . COLOSTOMY  02/22/2016  . CYSTOSCOPY WITH BIOPSY N/A 06/21/2018   Procedure: CYSTOSCOPY WITH BIOPSY  FULGURATION;  Surgeon: Irine Seal, MD;  Location: [facility];  Service: Urology;  Laterality: N/A;  . CYSTOSCOPY WITH LITHOLAPAXY N/A 06/21/2018   Procedure: CYSTOSCOPY WITH LITHOLAPAXY;  Surgeon: Irine Seal, MD;  Location: [facility];  Service: Urology;  Laterality: N/A;  . DORSAL CHEILECTOMY-HALLUX RIGIDUS Bilateral 2011  . HERNIA REPAIR  [id number]   VENTRAL umbilical hernia repair  . LAMINECTOMY  10/23/1979   AVM  . LAPAROSCOPIC SIGMOID COLECTOMY  02/22/2016  . [place]   left shoulder  . ROTATOR CUFF REPAIR Right 2001  . SHOULDER ARTHROSCOPY  2007   right  . SQUAMOUS CELL CARCINOMA EXCISION Left 2018   leg  . Toe Nails Removal Bilateral 1981   both great toes  . TOTAL SHOULDER REPLACEMENT   2001   left  . TRANSFER TENDON HAND  1982, 1983   to right hand X3  . VENTRAL HERNIA REPAIR  08/17/2010   Postop ileus (Dr. Redmond Pulling)    There were no vitals filed for this visit.   Subjective Assessment - 01/13/20 1420    Subjective "biggest issue with reaching out and back, no pain just weakness.              [facility] PT Assessment - 01/13/20 0001      Assessment   Medical Diagnosis Primary osteoarthritis, right shoulder; R-TSA, R    Referring Provider (PT) Douglass Rivers, MD                         [facility] Adult PT Treatment/Exercise - 01/13/20 0001      Shoulder Exercises: Seated   Flexion Strengthening;Right;10 reps   unweighted with emphasis on eccentric loading   Other Seated Exercises ue Ranger protraction 2 x 10 with yellow band, rows 2 x 10    Other Seated Exercises ER with UE ranger withyellow band,       Shoulder Exercises: Pulleys   Flexion 2 minutes    Scaption 2 minutes      Shoulder Exercises: Stretch   Other Shoulder Stretches upper trap/ levator scapuale stretch 1 x 30 seconds      Manual Therapy   Manual Therapy Soft tissue mobilization    Manual therapy comments MTPR along R upper trap/ levator scapuale and pec minor/ anterior deltoid    Soft tissue mobilization IASTM along R upper trap/ levator scapuale                    PT Short Term Goals - 01/01/20 1312      PT SHORT TERM GOAL #1   Title Pt will be Ind in a HEP    Baseline Started today    Time 3    Period Weeks    Status New    Target Date 01/22/20             PT Long Term Goals - 01/01/20 1314      PT LONG TERM GOAL #1   Title Improved R shoulder strength to 4/5 or greater    Baseline 3 -3+/5    Time 9    Period Weeks    Status New    Target Date 03/04/20      PT LONG TERM GOAL #2   Title Improve R shoulder AROM to flexion= 105d, abd =100d    Baseline Flexion=95d; abd =90d    Time 9    Period Weeks    Status  New    Target Date 03/04/20      PT  LONG TERM GOAL #3   Title Pt will toleratedthe UBE of 10 mins at level 2 to demonstrate improve tolerance of the R shoulder    Baseline NT    Time 9    Period Weeks    Status New    Target Date 03/04/20      PT LONG TERM GOAL #4   Title Pt will report improved tolerance of her R arm with driving to improve safety.    Baseline Decreased tolerance    Time 9    Period Weeks    Status New    Target Date 03/04/20      PT LONG TERM GOAL #5   Title Pt will report improved use of her R arm with bed mobility for needed changes in position at night for comfort and pressure relief.    Baseline Unable to complete position changes    Time 9    Period Weeks    Status New    Target Date 03/04/20      Additional Long Term Goals   Additional Long Term Goals Yes      PT LONG TERM GOAL #6   Title Pt will be Ind in a final HEP    Time 9    Period Weeks    Status New    Target Date 03/04/20                 Plan - 01/13/20 1507    Clinical Impression Statement continued focus on shoulder AAROM and strengthening of the posterior shoulder muscualure secdonary to increased anteriorly rolled shoulders. She responded well to [facility] along the R upper trap/ levator scapular and pec minor. reivewd exercises. pt noted feeling tired followed session and denied any pain.    PT Treatment/Interventions ADLs/Self Care Home Management;Electrical Stimulation;Iontophoresis 4mg /ml Dexamethasone;Moist Heat;Ultrasound;Neuromuscular re-education;Balance training;Therapeutic exercise;Therapeutic activities;Patient/family education;Manual techniques;Taping;Dry needling;Passive range of motion    PT Next Visit Plan Assess response to HEP. Continue development of ther ex/HEP to improve R shoulder strength and ROM, reaching overhead eccentric loading, conitnue UE ranger for ER and retraction    Consulted and Agree with Plan of Care Patient           Patient will benefit from skilled therapeutic intervention in  order to improve the following deficits and impairments:  Decreased range of motion, Obesity, Decreased activity tolerance, Decreased strength, Decreased mobility, Decreased balance  Visit Diagnosis: Primary osteoarthritis, right shoulder  Status post reverse total shoulder replacement, right  Decreased ROM of right shoulder  Weakness of shoulder     Problem List Patient Active Problem List   Diagnosis Date Noted  . Atopic dermatitis 11/18/2019  . Toe infection 08/12/2019  . Vaginal bleeding 07/18/2019  . Kidney stone 06/25/2018  . Colostomy status ([place]) 02/19/2018  . Right knee pain 04/09/2017  . Right hip pain 04/09/2017  . Headache 04/09/2017  . Depression 02/13/2017  . Obesity 11/01/2016  . Sleep apnea 01/31/2016  . Constipation 01/25/2016  . Hyperglycemia 05/18/2015  . Medicare annual wellness visit, subsequent 11/02/2014  . Preventative health care 06/26/2013  . Postmenopausal bleeding 12/17/2012  . Neurogenic bladder 12/01/2012  . Right shoulder pain 12/01/2012  . Mitral valve disorder 12/01/2012  . Rhinitis 01/03/2012  . CALLUS, RIGHT FOOT 10/30/2009  . ALOPECIA 10/06/2009  . INSOMNIA 08/04/2009  . Anemia 08/28/2008  . Hyperlipidemia 07/27/2008  . Essential hypertension 07/27/2008  . Incomplete quadriplegia at  C5-6 level ([place]) 07/24/2008   Starr Lake PT, DPT, LAT, ATC  01/13/20  3:12 PM      [facility] [address] [place], [place], [address] Phone: [phone]   Fax:  [phone]  Name: [name] MRN: [id number] Date of Birth: 05-05-1951

## 2020-01-17 DIAGNOSIS — Z1231 Encounter for screening mammogram for malignant neoplasm of breast: Secondary | ICD-10-CM | POA: Diagnosis not present

## 2020-01-17 LAB — HM MAMMOGRAPHY

## 2020-01-22 ENCOUNTER — Other Ambulatory Visit: Payer: Self-pay

## 2020-01-22 ENCOUNTER — Encounter: Payer: Self-pay | Admitting: Physical Therapy

## 2020-01-22 ENCOUNTER — Ambulatory Visit: Payer: Medicare PPO | Admitting: Physical Therapy

## 2020-01-22 DIAGNOSIS — M19011 Primary osteoarthritis, right shoulder: Secondary | ICD-10-CM | POA: Diagnosis not present

## 2020-01-22 DIAGNOSIS — R29898 Other symptoms and signs involving the musculoskeletal system: Secondary | ICD-10-CM

## 2020-01-22 DIAGNOSIS — Z96611 Presence of right artificial shoulder joint: Secondary | ICD-10-CM

## 2020-01-22 DIAGNOSIS — M25611 Stiffness of right shoulder, not elsewhere classified: Secondary | ICD-10-CM | POA: Diagnosis not present

## 2020-01-22 NOTE — Therapy (Signed)
[place], [place], [address] Phone: [phone]   Fax:  [phone]  Physical Therapy Treatment  Patient Details  Name: [name] MRN: [id number] Date of Birth: 1950-08-22 Referring Provider (PT): Douglass Rivers, MD   Encounter Date: 01/22/2020   PT End of Session - 01/22/20 1417    Visit Number 4    Number of Visits 9    Date for PT Re-Evaluation 03/04/20    Authorization Type HUMANA MEDICARE CHOICE PPO; MEDICAID OF [place]    PT Start Time 1415    PT Stop Time 1500    PT Time Calculation (min) 45 min    Activity Tolerance Patient tolerated treatment well    Behavior During Therapy [facility] for tasks assessed/performed           Past Medical History:  Diagnosis Date  . Acute bronchitis 05/16/2015  . Anemia    after shoulder replacement  . Autonomic dysreflexia   . AVM (arteriovenous malformation) spine 1981  . Bowel dysfunction    constipation and fecal retention  . Cerumen impaction 10/11/2015  . Complication of anesthesia    low temperature after general anesthesia for colostomy  . Constipation 01/25/2016  . Contusion 05/18/2014   Of foot  . Depression 02/13/2017   History of   . Headache 04/09/2017  . History of kidney stones   . History of neurogenic bowel   . History of rotator cuff tear    Right  . History of ventral hernia   . Hyperglycemia 05/18/2015  . Hyperlipidemia   . Hypertension    No medications at this time  . Incomplete quadriplegia at C5-6 level ([place]) 07/24/2008   Qualifier: Diagnosis of  By: Wynona Luna Requires bowel regimen including enemas and indwelling foley cath Has had tendency towards pressure sores in past. Had a tear with a transfer in Hospital in past for surgery and developed a sore at that time.   . Low blood pressure reading    Occassionally  . Medicare annual wellness visit, subsequent 11/02/2014   Follows with [facility] Declines colonoscopy  No h/o  abnormal pap, declines further paps  . MVP (mitral valve prolapse) 12/01/2012   Mild  . Neurogenic bladder   . OA (osteoarthritis)    Hip and shoulders, neck  . Obesity 11/01/2016  . OSA on CPAP 01/31/2016  . Pain in joint, shoulder region 12/01/2012   Long history B/l Follows with [facility] Hassell [facility]  . Postmenopausal bleeding 12/17/2012  . Preventative health care 06/26/2013  . Quadriplegia ([place]) 1981   Secondary to ruptured AVM C5-C6  . Sacral pressure ulcer 12/01/2012   H/o Follows with Dr Tomi Likens of Dermatology  . Skin cancer 11/01/2016   2 BCC RLE excised by Dr Wilhemina Bonito 2018 [place] LLE excised by Dr Wilhemina Bonito 2018  . Vaginitis and vulvovaginitis 12/17/2012  . Wears glasses     Past Surgical History:  Procedure Laterality Date  . ANAL TAG REMOVAL  2014  . BASAL CELL CARCINOMA EXCISION  2016   Lip  . BASAL CELL CARCINOMA EXCISION  2018   right leg x2  . BASAL CELL CARCINOMA EXCISION Left 2018   leg, chest  . BASAL CELL CARCINOMA EXCISION Right 2019   leg  . BLADDER STONE REMOVAL  1986/87  . COLONOSCOPY  02/22/2016  . COLOSTOMY  02/22/2016  . CYSTOSCOPY WITH BIOPSY N/A 06/21/2018   Procedure: CYSTOSCOPY WITH BIOPSY  FULGURATION;  Surgeon: Irine Seal, MD;  Location: [facility];  Service: Urology;  Laterality: N/A;  . CYSTOSCOPY WITH LITHOLAPAXY N/A 06/21/2018   Procedure: CYSTOSCOPY WITH LITHOLAPAXY;  Surgeon: Irine Seal, MD;  Location[facility];  Service: Urology;  Laterality: N/A;  . DORSAL CHEILECTOMY-HALLUX RIGIDUS Bilateral 2011  . HERNIA REPAIR  [id number]   VENTRAL umbilical hernia repair  . LAMINECTOMY  10/23/1979   AVM  . LAPAROSCOPIC SIGMOID COLECTOMY  02/22/2016  . [place]   left shoulder  . ROTATOR CUFF REPAIR Right 2001  . SHOULDER ARTHROSCOPY  2007   right  . SQUAMOUS CELL CARCINOMA EXCISION Left 2018   leg  . Toe Nails Removal Bilateral 1981   both great toes  . TOTAL SHOULDER REPLACEMENT   2001   left  . TRANSFER TENDON HAND  1982, 1983   to right hand X3  . VENTRAL HERNIA REPAIR  08/17/2010   Postop ileus (Dr. Redmond Pulling)    There were no vitals filed for this visit.   Subjective Assessment - 01/22/20 1417    Subjective "I am doing okay today, some soreness after the last session    Pertinent History Incomplete quadriplegia at C5-6 level (1981), L TSR 21 years ago, colostomy, indwelling urine catheter    Patient Stated Goals Pt reports she wants to strengthen her R arm to improve her driving tolerance, to assist better with her bed mobility, and to protect her L TSR which she has had for 21 years.    Currently in Pain? No/denies              [facility] PT Assessment - 01/22/20 0001      Assessment   Medical Diagnosis Primary osteoarthritis, right shoulder; R-TSA, R    Referring Provider (PT) Douglass Rivers, MD                         [facility] Adult PT Treatment/Exercise - 01/22/20 0001      Shoulder Exercises: Seated   Flexion Strengthening;Both;10 reps   UE ranger on 8 inch step x 4 sets (increasing height ea. set   Theraband Level (Shoulder Flexion) Level 1 (Yellow)   with no resistance raising with eccentric loading.   Other Seated Exercises ue Ranger rows 2 x 10 focusing on scapular retraction before extending shoulder.    Other Seated Exercises UE ranger ER with yellow band 2 x 10, horizontal abduction on the R      Shoulder Exercises: Stretch   Other Shoulder Stretches upper trap/ levator scapuale stretch 1 x 30 seconds      Manual Therapy   Manual therapy comments MTPR along R upper trap/ levator scapuale and pec minor/ anterior deltoid    Soft tissue mobilization IASTM along R upper trap/ levator scapuale                    PT Short Term Goals - 01/01/20 1312      PT SHORT TERM GOAL #1   Title Pt will be Ind in a HEP    Baseline Started today    Time 3    Period Weeks    Status New    Target Date 01/22/20             PT  Long Term Goals - 01/01/20 1314      PT LONG TERM GOAL #1   Title Improved R shoulder strength to  4/5 or greater    Baseline 3 -3+/5    Time 9    Period Weeks    Status New    Target Date 03/04/20      PT LONG TERM GOAL #2   Title Improve R shoulder AROM to flexion= 105d, abd =100d    Baseline Flexion=95d; abd =90d    Time 9    Period Weeks    Status New    Target Date 03/04/20      PT LONG TERM GOAL #3   Title Pt will toleratedthe UBE of 10 mins at level 2 to demonstrate improve tolerance of the R shoulder    Baseline NT    Time 9    Period Weeks    Status New    Target Date 03/04/20      PT LONG TERM GOAL #4   Title Pt will report improved tolerance of her R arm with driving to improve safety.    Baseline Decreased tolerance    Time 9    Period Weeks    Status New    Target Date 03/04/20      PT LONG TERM GOAL #5   Title Pt will report improved use of her R arm with bed mobility for needed changes in position at night for comfort and pressure relief.    Baseline Unable to complete position changes    Time 9    Period Weeks    Status New    Target Date 03/04/20      Additional Long Term Goals   Additional Long Term Goals Yes      PT LONG TERM GOAL #6   Title Pt will be Ind in a final HEP    Time 9    Period Weeks    Status New    Target Date 03/04/20                 Plan - 01/22/20 1511    Clinical Impression Statement continued STW along the pec major/ minor and  Rupper trap followed with stretching. worked on posterior shoulder strengthening to facilitate efficient positioning then progressing to shoulder flexion using UE ranger to promote AAROM.    PT Treatment/Interventions ADLs/Self Care Home Management;Electrical Stimulation;Iontophoresis 4mg /ml Dexamethasone;Moist Heat;Ultrasound;Neuromuscular re-education;Balance training;Therapeutic exercise;Therapeutic activities;Patient/family education;Manual techniques;Taping;Dry needling;Passive range of  motion    PT Next Visit Plan Assess response to HEP. Continue development of ther ex/HEP to improve R shoulder strength and ROM, reaching overhead eccentric loading, conitnue UE ranger for ER and retraction, trial wall walks.    PT [place]           Patient will benefit from skilled therapeutic intervention in order to improve the following deficits and impairments:  Decreased range of motion, Obesity, Decreased activity tolerance, Decreased strength, Decreased mobility, Decreased balance  Visit Diagnosis: Primary osteoarthritis, right shoulder  Status post reverse total shoulder replacement, right  Decreased ROM of right shoulder  Weakness of shoulder     Problem List Patient Active Problem List   Diagnosis Date Noted  . Atopic dermatitis 11/18/2019  . Toe infection 08/12/2019  . Vaginal bleeding 07/18/2019  . Kidney stone 06/25/2018  . Colostomy status ([place]) 02/19/2018  . Right knee pain 04/09/2017  . Right hip pain 04/09/2017  . Headache 04/09/2017  . Depression 02/13/2017  . Obesity 11/01/2016  . Sleep apnea 01/31/2016  . Constipation 01/25/2016  . Hyperglycemia 05/18/2015  . Medicare annual wellness visit, subsequent 11/02/2014  . Preventative  health care 06/26/2013  . Postmenopausal bleeding 12/17/2012  . Neurogenic bladder 12/01/2012  . Right shoulder pain 12/01/2012  . Mitral valve disorder 12/01/2012  . Rhinitis 01/03/2012  . CALLUS, RIGHT FOOT 10/30/2009  . ALOPECIA 10/06/2009  . INSOMNIA 08/04/2009  . Anemia 08/28/2008  . Hyperlipidemia 07/27/2008  . Essential hypertension 07/27/2008  . Incomplete quadriplegia at C5-6 level ([place]) 07/24/2008   Starr Lake PT, DPT, LAT, ATC  01/22/20  3:15 PM      [facility] [address] [place], [place], [address] Phone: [phone]   Fax:  [phone]  Name: [name] MRN: [id number] Date of Birth: 06-05-1951

## 2020-01-23 ENCOUNTER — Encounter: Payer: Self-pay | Admitting: *Deleted

## 2020-01-29 ENCOUNTER — Ambulatory Visit: Payer: Medicare PPO | Admitting: Physical Therapy

## 2020-01-29 ENCOUNTER — Other Ambulatory Visit: Payer: Self-pay

## 2020-01-29 DIAGNOSIS — M25611 Stiffness of right shoulder, not elsewhere classified: Secondary | ICD-10-CM | POA: Diagnosis not present

## 2020-01-29 DIAGNOSIS — R29898 Other symptoms and signs involving the musculoskeletal system: Secondary | ICD-10-CM

## 2020-01-29 DIAGNOSIS — Z96611 Presence of right artificial shoulder joint: Secondary | ICD-10-CM

## 2020-01-29 DIAGNOSIS — M19011 Primary osteoarthritis, right shoulder: Secondary | ICD-10-CM

## 2020-01-29 NOTE — Therapy (Signed)
[name], [place], [address] Phone: [phone]   Fax:  [phone]  Physical Therapy Treatment  Patient Details  Name: [name] MRN: [id number] Date of Birth: 05/02/1951 Referring Provider (PT): Alexandra Rivers, MD   Encounter Date: 01/29/2020   PT End of Session - 01/29/20 1418    Visit Number 5    Number of Visits 9    Date for PT Re-Evaluation 03/04/20    Authorization Type [employer] MEDICARE CHOICE PPO; MEDICAID OF [place]    PT Start Time 6962    PT Stop Time 1456    PT Time Calculation (min) 39 min    Activity Tolerance Patient tolerated treatment well    Behavior During Therapy [facility] for tasks assessed/performed           Past Medical History:  Diagnosis Date  . Acute bronchitis 05/16/2015  . Anemia    after shoulder replacement  . Autonomic dysreflexia   . AVM (arteriovenous malformation) spine 1981  . Bowel dysfunction    constipation and fecal retention  . Cerumen impaction 10/11/2015  . Complication of anesthesia    low temperature after general anesthesia for colostomy  . Constipation 01/25/2016  . Contusion 05/18/2014   Of foot  . Depression 02/13/2017   History of   . Headache 04/09/2017  . History of kidney stones   . History of neurogenic bowel   . History of rotator cuff tear    Right  . History of ventral hernia   . Hyperglycemia 05/18/2015  . Hyperlipidemia   . Hypertension    No medications at this time  . Incomplete quadriplegia at C5-6 level ([place]) 07/24/2008   Qualifier: Diagnosis of  By: [name] Requires bowel regimen including enemas and indwelling foley cath Has had tendency towards pressure sores in past. Had a tear with a transfer in Hospital in past for surgery and developed a sore at that time.   . Low blood pressure reading    Occassionally  . Medicare annual wellness visit, subsequent 11/02/2014   Follows with [facility] Declines colonoscopy  No h/o  abnormal pap, declines further paps  . MVP (mitral valve prolapse) 12/01/2012   Mild  . Neurogenic bladder   . OA (osteoarthritis)    Hip and shoulders, neck  . Obesity 11/01/2016  . OSA on CPAP 01/31/2016  . Pain in joint, shoulder region 12/01/2012   Long history B/l Follows with Dr Hassell Done at [facility]  . Postmenopausal bleeding 12/17/2012  . Preventative health care 06/26/2013  . Quadriplegia ([place]) 1981   Secondary to ruptured AVM C5-C6  . Sacral pressure ulcer 12/01/2012   H/o Follows with Dr Tomi Likens of Dermatology  . Skin cancer 11/01/2016   2 BCC RLE excised by Dr Wilhemina Bonito 2018 [place] LLE excised by Dr Wilhemina Bonito 2018  . Vaginitis and vulvovaginitis 12/17/2012  . Wears glasses     Past Surgical History:  Procedure Laterality Date  . ANAL TAG REMOVAL  2014  . BASAL CELL CARCINOMA EXCISION  2016   Lip  . BASAL CELL CARCINOMA EXCISION  2018   right leg x2  . BASAL CELL CARCINOMA EXCISION Left 2018   leg, chest  . BASAL CELL CARCINOMA EXCISION Right 2019   leg  . BLADDER STONE REMOVAL  1986/87  . COLONOSCOPY  02/22/2016  . COLOSTOMY  02/22/2016  . CYSTOSCOPY WITH BIOPSY N/A 06/21/2018   Procedure: CYSTOSCOPY WITH BIOPSY  FULGURATION;  Surgeon: Irine Seal, MD;  Location: [facility];  Service: Urology;  Laterality: N/A;  . CYSTOSCOPY WITH LITHOLAPAXY N/A 06/21/2018   Procedure: CYSTOSCOPY WITH LITHOLAPAXY;  Surgeon: Irine Seal, MD;  Location: [facility];  Service: Urology;  Laterality: N/A;  . DORSAL CHEILECTOMY-HALLUX RIGIDUS Bilateral 2011  . HERNIA REPAIR  1610   VENTRAL umbilical hernia repair  . LAMINECTOMY  10/23/1979   AVM  . LAPAROSCOPIC SIGMOID COLECTOMY  02/22/2016  . [name]   left shoulder  . ROTATOR CUFF REPAIR Right 2001  . SHOULDER ARTHROSCOPY  2007   right  . SQUAMOUS CELL CARCINOMA EXCISION Left 2018   leg  . Toe Nails Removal Bilateral 1981   both great toes  . TOTAL SHOULDER REPLACEMENT   2001   left  . TRANSFER TENDON HAND  1982, 1983   to right hand X3  . VENTRAL HERNIA REPAIR  08/17/2010   Postop ileus (Dr. Redmond Pulling)    There were no vitals filed for this visit.   Subjective Assessment - 01/29/20 1420    Subjective " I am still having issues with the weakness."    Patient Stated Goals Pt reports she wants to strengthen her R arm to improve her driving tolerance, to assist better with her bed mobility, and to protect her L TSR which she has had for 21 years.                             [place] Adult PT Treatment/Exercise - 01/29/20 0001      Elbow Exercises   Elbow Flexion 15 reps   x 3 sets with 2.5#     Shoulder Exercises: Seated   Row Strengthening;Right;15 reps;Theraband    Theraband Level (Shoulder Row) Level 3 (Green)   with band around elbow    Flexion Strengthening;10 reps   3 x reaching forward at gradually increasing height   Flexion Weight (lbs) 1 lb   cuff weihgt   Flexion Limitations eccentric lowering of R shoulder    with assiatance rasing the arm  2 x 10 with 2.5   Abduction 10 reps   x 3 sets scaption angle   ABduction Weight (lbs) 1    Other Seated Exercises short lever shoulder abduction 1 x 15      Manual Therapy   Manual therapy comments MTPR along R upper trap/ levator scapuale and pec minor/ anterior deltoid                  PT Education - 01/29/20 1510    Education Details rows using band in the door and looping around her elbow.    Person(s) Educated Patient    Methods Explanation;Verbal cues    Comprehension Verbalized understanding;Verbal cues required            PT Short Term Goals - 01/01/20 1312      PT SHORT TERM GOAL #1   Title Pt will be Ind in a HEP    Baseline Started today    Time 3    Period Weeks    Status New    Target Date 01/22/20             PT Long Term Goals - 01/01/20 1314      PT LONG TERM GOAL #1   Title Improved R shoulder strength to 4/5 or greater    Baseline 3  -3+/5  Time 9    Period Weeks    Status New    Target Date 03/04/20      PT LONG TERM GOAL #2   Title Improve R shoulder AROM to flexion= 105d, abd =100d    Baseline Flexion=95d; abd =90d    Time 9    Period Weeks    Status New    Target Date 03/04/20      PT LONG TERM GOAL #3   Title Pt will toleratedthe UBE of 10 mins at level 2 to demonstrate improve tolerance of the R shoulder    Baseline NT    Time 9    Period Weeks    Status New    Target Date 03/04/20      PT LONG TERM GOAL #4   Title Pt will report improved tolerance of her R arm with driving to improve safety.    Baseline Decreased tolerance    Time 9    Period Weeks    Status New    Target Date 03/04/20      PT LONG TERM GOAL #5   Title Pt will report improved use of her R arm with bed mobility for needed changes in position at night for comfort and pressure relief.    Baseline Unable to complete position changes    Time 9    Period Weeks    Status New    Target Date 03/04/20      Additional Long Term Goals   Additional Long Term Goals Yes      PT LONG TERM GOAL #6   Title Pt will be Ind in a final HEP    Time 9    Period Weeks    Status New    Target Date 03/04/20                 Plan - 01/29/20 1509    Clinical Impression Statement Focused session primarly on overhead and lateral reaching with increased reps and utilizing cuff weight to promote endurance. She did very well with all exercises but does fatigue qucikly. no report of pain during or following session, but she did note feeling fatigued.    PT Treatment/Interventions ADLs/Self Care Home Management;Electrical Stimulation;Iontophoresis 4mg /ml Dexamethasone;Moist Heat;Ultrasound;Neuromuscular re-education;Balance training;Therapeutic exercise;Therapeutic activities;Patient/family education;Manual techniques;Taping;Dry needling;Passive range of motion    PT Next Visit Plan Assess response to HEP. Continue development of ther ex/HEP to  improve R shoulder strength and ROM, reaching overhead eccentric loading, conitnue UE ranger for ER and retraction, trial wall walks.           Patient will benefit from skilled therapeutic intervention in order to improve the following deficits and impairments:  Decreased range of motion, Obesity, Decreased activity tolerance, Decreased strength, Decreased mobility, Decreased balance  Visit Diagnosis: Primary osteoarthritis, right shoulder  Status post reverse total shoulder replacement, right  Decreased ROM of right shoulder  Weakness of shoulder     Problem List Patient Active Problem List   Diagnosis Date Noted  . Atopic dermatitis 11/18/2019  . Toe infection 08/12/2019  . Vaginal bleeding 07/18/2019  . Kidney stone 06/25/2018  . Colostomy status ([place]) 02/19/2018  . Right knee pain 04/09/2017  . Right hip pain 04/09/2017  . Headache 04/09/2017  . Depression 02/13/2017  . Obesity 11/01/2016  . Sleep apnea 01/31/2016  . Constipation 01/25/2016  . Hyperglycemia 05/18/2015  . Medicare annual wellness visit, subsequent 11/02/2014  . Preventative health care 06/26/2013  . Postmenopausal bleeding 12/17/2012  . Neurogenic  bladder 12/01/2012  . Right shoulder pain 12/01/2012  . Mitral valve disorder 12/01/2012  . Rhinitis 01/03/2012  . CALLUS, RIGHT FOOT 10/30/2009  . ALOPECIA 10/06/2009  . INSOMNIA 08/04/2009  . Anemia 08/28/2008  . Hyperlipidemia 07/27/2008  . Essential hypertension 07/27/2008  . Incomplete quadriplegia at C5-6 level ([place]) 07/24/2008   Starr Lake PT, DPT, LAT, ATC  01/29/20  3:14 PM      [facility] [address] [place], [place], [address] Phone: [phone]   Fax:  [phone]  Name: MELLANIE BEJARANO MRN: [id number] Date of Birth: 02-27-1951

## 2020-01-30 DIAGNOSIS — G8250 Quadriplegia, unspecified: Secondary | ICD-10-CM | POA: Diagnosis not present

## 2020-01-30 DIAGNOSIS — R32 Unspecified urinary incontinence: Secondary | ICD-10-CM | POA: Diagnosis not present

## 2020-01-30 DIAGNOSIS — Z933 Colostomy status: Secondary | ICD-10-CM | POA: Diagnosis not present

## 2020-02-05 ENCOUNTER — Ambulatory Visit: Payer: Medicare PPO

## 2020-02-05 ENCOUNTER — Other Ambulatory Visit: Payer: Self-pay

## 2020-02-05 DIAGNOSIS — R29898 Other symptoms and signs involving the musculoskeletal system: Secondary | ICD-10-CM

## 2020-02-05 DIAGNOSIS — Z96611 Presence of right artificial shoulder joint: Secondary | ICD-10-CM

## 2020-02-05 DIAGNOSIS — M25611 Stiffness of right shoulder, not elsewhere classified: Secondary | ICD-10-CM | POA: Diagnosis not present

## 2020-02-05 DIAGNOSIS — M19011 Primary osteoarthritis, right shoulder: Secondary | ICD-10-CM

## 2020-02-05 NOTE — Therapy (Signed)
[place], [place], [address] Phone: [phone]   Fax:  [phone]  Physical Therapy Treatment  Patient Details  Name: [name] MRN: [id number] Date of Birth: [birth year] Referring Provider (PT): Douglass Rivers, MD   Encounter Date: 02/05/2020   PT End of Session - 02/05/20 1510    Visit Number 6    Number of Visits 9    Date for PT Re-Evaluation 03/04/20    Authorization Type HUMA[employer] MEDICARE CHOICE PPO; MEDICAID OF [place]    PT Start Time 6834    PT Stop Time 1455    PT Time Calculation (min) 48 min    Activity Tolerance Patient tolerated treatment well    Behavior During Therapy [facility] for tasks assessed/performed           Past Medical History:  Diagnosis Date  . Acute bronchitis 05/16/2015  . Anemia    after shoulder replacement  . Autonomic dysreflexia   . AVM (arteriovenous malformation) spine 1981  . Bowel dysfunction    constipation and fecal retention  . Cerumen impaction 10/11/2015  . Complication of anesthesia    low temperature after general anesthesia for colostomy  . Constipation 01/25/2016  . Contusion 05/18/2014   Of foot  . Depression 02/13/2017   History of   . Headache 04/09/2017  . History of kidney stones   . History of neurogenic bowel   . History of rotator cuff tear    Right  . History of ventral hernia   . Hyperglycemia 05/18/2015  . Hyperlipidemia   . Hypertension    No medications at this time  . Incomplete quadriplegia at C5-6 level ([place]) 07/24/2008   Qualifier: Diagnosis of  By: Wynona Luna Requires bowel regimen including enemas and indwelling foley cath Has had tendency towards pressure sores in past. Had a tear with a transfer in Hospital in past for surgery and developed a sore at that time.   . Low blood pressure reading    Occassionally  . Medicare annual wellness visit, subsequent 11/02/2014   Follows with [facility] Declines colonoscopy  No h/o  abnormal pap, declines further paps  . MVP (mitral valve prolapse) 12/01/2012   Mild  . Neurogenic bladder   . OA (osteoarthritis)    Hip and shoulders, neck  . Obesity 11/01/2016  . OSA on CPAP 01/31/2016  . Pain in joint, shoulder region 12/01/2012   Long history B/l Follows with Dr Hassell Done at [facility]  . Postmenopausal bleeding 12/17/2012  . Preventative health care 06/26/2013  . Quadriplegia ([place]) 1981   Secondary to ruptured AVM C5-C6  . Sacral pressure ulcer 12/01/2012   H/o Follows with Dr Tomi Likens of Dermatology  . Skin cancer 11/01/2016   2 BCC RLE excised by Dr Wilhemina Bonito 2018 [place] LLE excised by Dr Wilhemina Bonito 2018  . Vaginitis and vulvovaginitis 12/17/2012  . Wears glasses     Past Surgical History:  Procedure Laterality Date  . ANAL TAG REMOVAL  2014  . BASAL CELL CARCINOMA EXCISION  2016   Lip  . BASAL CELL CARCINOMA EXCISION  2018   right leg x2  . BASAL CELL CARCINOMA EXCISION Left 2018   leg, chest  . BASAL CELL CARCINOMA EXCISION Right 2019   leg  . BLADDER STONE REMOVAL  1986/87  . COLONOSCOPY  02/22/2016  . COLOSTOMY  02/22/2016  . CYSTOSCOPY WITH BIOPSY N/A 06/21/2018   Procedure: CYSTOSCOPY WITH BIOPSY  FULGURATION;  Surgeon: Irine Seal, MD;  Location[facility];  Service: Urology;  Laterality: N/A;  . CYSTOSCOPY WITH LITHOLAPAXY N/A 06/21/2018   Procedure: CYSTOSCOPY WITH LITHOLAPAXY;  Surgeon: Irine Seal, MD;  Location: [facility];  Service: Urology;  Laterality: N/A;  . DORSAL CHEILECTOMY-HALLUX RIGIDUS Bilateral 2011  . HERNIA REPAIR  7253   VENTRAL umbilical hernia repair  . LAMINECTOMY  10/23/1979   AVM  . LAPAROSCOPIC SIGMOID COLECTOMY  02/22/2016  . [place]   left shoulder  . ROTATOR CUFF REPAIR Right 2001  . SHOULDER ARTHROSCOPY  2007   right  . SQUAMOUS CELL CARCINOMA EXCISION Left 2018   leg  . Toe Nails Removal Bilateral 1981   both great toes  . TOTAL SHOULDER REPLACEMENT   2001   left  . TRANSFER TENDON HAND  1982, 1983   to right hand X3  . VENTRAL HERNIA REPAIR  08/17/2010   Postop ileus (Dr. Redmond Pulling)    There were no vitals filed for this visit.       [facility] PT Assessment - 02/05/20 0001      AROM   Right Shoulder Flexion 105 Degrees    Right Shoulder ABduction 112 Degrees    Right Shoulder External Rotation 30 Degrees      PROM   Right Shoulder Flexion 118 Degrees    Right Shoulder ABduction 122 Degrees    Right Shoulder External Rotation 55 Degrees                         [facility] Adult PT Treatment/Exercise - 02/05/20 0001      Shoulder Exercises: Seated   Row Strengthening;Right;Theraband;10 reps    Theraband Level (Shoulder Row) Level 3 (Green)   with band around elbow    Row Limitations 3 sets    Flexion Strengthening;Right;10 reps    Flexion Limitations eccentric lowering/ wall assisted lifting    Abduction Strengthening;10 reps    ABduction Limitations eccentric lowering/ wall assisted lifting    Other Seated Exercises Manually resisted ER/IR 10x3    Other Seated Exercises Supported arm in hor. abd. ER c assist to endrange, then eccentric lowering  10x2                    PT Short Term Goals - 02/05/20 1519      PT SHORT TERM GOAL #1   Title Pt will be Ind in a HEP. Achieved    Baseline Started today    Status Achieved    Target Date 02/05/20             PT Long Term Goals - 02/05/20 1520      PT LONG TERM GOAL #2   Title Improve R shoulder AROM to flexion= 105d, abd =100d. Achieved    Baseline Flexion=95d; abd =90d    Target Date 02/05/20      Additional Long Term Goals   Additional Long Term Goals Yes      PT LONG TERM GOAL #7   Title Improved AROM for R sh flexion and abd to 115d or greater    Baseline Flexion 95d; abd 90d    Status New    Target Date 03/04/20                 Plan - 02/05/20 1511    Clinical Impression Statement PT focused on strengthening of the R  shoulder and elbow.  Both PROM and AROM for flexion, ext, and ER have made good progress. Pt reports improved strength of the R UE with toleraing short distances. Pt states is is not able to drive longer distances due to fatigue.    Personal Factors and Comorbidities Fitness;Comorbidity 3+;Past/Current Experience;Comorbidity 1    Comorbidities Incomplete quadriplegia at C5-6 level (1981); Mitral valve disorder, depression, obesity    Examination-Activity Limitations Reach Overhead;Lift;Other    Stability/Clinical Decision Making Evolving/Moderate complexity    Clinical Decision Making Moderate    Rehab Potential Good    PT Frequency 1x / week    PT Duration 8 weeks    PT Treatment/Interventions ADLs/Self Care Home Management;Electrical Stimulation;Iontophoresis 4mg /ml Dexamethasone;Moist Heat;Ultrasound;Neuromuscular re-education;Balance training;Therapeutic exercise;Therapeutic activities;Patient/family education;Manual techniques;Taping;Dry needling;Passive range of motion    PT Next Visit Plan Continue strengthening to improve tolerance and overhead use of the UE.    PT Home Exercise Plan [email or web address]    Consulted and Agree with Plan of Care Patient           Patient will benefit from skilled therapeutic intervention in order to improve the following deficits and impairments:  Decreased range of motion, Obesity, Decreased activity tolerance, Decreased strength, Decreased mobility, Decreased balance  Visit Diagnosis: Primary osteoarthritis, right shoulder  Status post reverse total shoulder replacement, right  Decreased ROM of right shoulder  Weakness of shoulder     Problem List Patient Active Problem List   Diagnosis Date Noted  . Atopic dermatitis 11/18/2019  . Toe infection 08/12/2019  . Vaginal bleeding 07/18/2019  . Kidney stone 06/25/2018  . Colostomy status ([place]) 02/19/2018  . Right knee pain 04/09/2017  . Right hip pain 04/09/2017  . Headache 04/09/2017  . Depression  02/13/2017  . Obesity 11/01/2016  . Sleep apnea 01/31/2016  . Constipation 01/25/2016  . Hyperglycemia 05/18/2015  . Medicare annual wellness visit, subsequent 11/02/2014  . Preventative health care 06/26/2013  . Postmenopausal bleeding 12/17/2012  . Neurogenic bladder 12/01/2012  . Right shoulder pain 12/01/2012  . Mitral valve disorder 12/01/2012  . Rhinitis 01/03/2012  . CALLUS, RIGHT FOOT 10/30/2009  . ALOPECIA 10/06/2009  . INSOMNIA 08/04/2009  . Anemia 08/28/2008  . Hyperlipidemia 07/27/2008  . Essential hypertension 07/27/2008  . Incomplete quadriplegia at C5-6 level ([place]) 07/24/2008   Gar Ponto MS, PT 02/05/20 3:28 PM  [facility] [address] [place], [place], [address] Phone: [phone]   Fax:  [phone]  Name: [name] MRN: [id number] Date of Birth: 1950/08/11

## 2020-02-10 ENCOUNTER — Other Ambulatory Visit: Payer: Self-pay | Admitting: Family Medicine

## 2020-02-10 DIAGNOSIS — G4733 Obstructive sleep apnea (adult) (pediatric): Secondary | ICD-10-CM | POA: Diagnosis not present

## 2020-02-12 ENCOUNTER — Ambulatory Visit: Payer: Medicare PPO

## 2020-02-18 DIAGNOSIS — M19011 Primary osteoarthritis, right shoulder: Secondary | ICD-10-CM | POA: Diagnosis not present

## 2020-02-18 DIAGNOSIS — Z96611 Presence of right artificial shoulder joint: Secondary | ICD-10-CM | POA: Diagnosis not present

## 2020-02-21 ENCOUNTER — Ambulatory Visit: Payer: Medicare PPO | Attending: Orthopaedic Surgery | Admitting: Physical Therapy

## 2020-02-21 ENCOUNTER — Encounter: Payer: Self-pay | Admitting: Physical Therapy

## 2020-02-21 ENCOUNTER — Other Ambulatory Visit: Payer: Self-pay

## 2020-02-21 DIAGNOSIS — M25611 Stiffness of right shoulder, not elsewhere classified: Secondary | ICD-10-CM | POA: Diagnosis not present

## 2020-02-21 DIAGNOSIS — R29898 Other symptoms and signs involving the musculoskeletal system: Secondary | ICD-10-CM | POA: Diagnosis not present

## 2020-02-21 DIAGNOSIS — M19011 Primary osteoarthritis, right shoulder: Secondary | ICD-10-CM | POA: Diagnosis not present

## 2020-02-21 DIAGNOSIS — Z96611 Presence of right artificial shoulder joint: Secondary | ICD-10-CM

## 2020-02-21 NOTE — Therapy (Signed)
[place], [place], [address] Phone: [phone]   Fax:  [phone]  Physical Therapy Treatment  Patient Details  Name: [name] MRN: [id number] Date of Birth: 10-16-1950 Referring Provider (PT): Douglass Rivers, MD   Encounter Date: 02/21/2020   PT End of Session - 02/21/20 1400    Visit Number 7    Number of Visits 9    Date for PT Re-Evaluation 03/04/20    Authorization Type HUMA[employer] MEDICARE CHOICE PPO; MEDICAID OF [place]    PT Start Time 1400    PT Stop Time 1445    PT Time Calculation (min) 45 min    Activity Tolerance Patient tolerated treatment well    Behavior During Therapy [facility] for tasks assessed/performed           Past Medical History:  Diagnosis Date  . Acute bronchitis 05/16/2015  . Anemia    after shoulder replacement  . Autonomic dysreflexia   . AVM (arteriovenous malformation) spine 1981  . Bowel dysfunction    constipation and fecal retention  . Cerumen impaction 10/11/2015  . Complication of anesthesia    low temperature after general anesthesia for colostomy  . Constipation 01/25/2016  . Contusion 05/18/2014   Of foot  . Depression 02/13/2017   History of   . Headache 04/09/2017  . History of kidney stones   . History of neurogenic bowel   . History of rotator cuff tear    Right  . History of ventral hernia   . Hyperglycemia 05/18/2015  . Hyperlipidemia   . Hypertension    No medications at this time  . Incomplete quadriplegia at C5-6 level ([place]) 07/24/2008   Qualifier: Diagnosis of  By: Wynona Luna Requires bowel regimen including enemas and indwelling foley cath Has had tendency towards pressure sores in past. Had a tear with a transfer in Hospital in past for surgery and developed a sore at that time.   . Low blood pressure reading    Occassionally  . Medicare annual wellness visit, subsequent 11/02/2014   Follows with [facility] Declines colonoscopy  No h/o  abnormal pap, declines further paps  . MVP (mitral valve prolapse) 12/01/2012   Mild  . Neurogenic bladder   . OA (osteoarthritis)    Hip and shoulders, neck  . Obesity 11/01/2016  . OSA on CPAP 01/31/2016  . Pain in joint, shoulder region 12/01/2012   Long history B/l Follows with Dr Hassell Done at [facility]  . Postmenopausal bleeding 12/17/2012  . Preventative health care 06/26/2013  . Quadriplegia ([place]) 1981   Secondary to ruptured AVM C5-C6  . Sacral pressure ulcer 12/01/2012   H/o Follows with Dr Tomi Likens of Dermatology  . Skin cancer 11/01/2016   2 BCC RLE excised by Dr Wilhemina Bonito 2018 [place] LLE excised by Dr Wilhemina Bonito 2018  . Vaginitis and vulvovaginitis 12/17/2012  . Wears glasses     Past Surgical History:  Procedure Laterality Date  . ANAL TAG REMOVAL  2014  . BASAL CELL CARCINOMA EXCISION  2016   Lip  . BASAL CELL CARCINOMA EXCISION  2018   right leg x2  . BASAL CELL CARCINOMA EXCISION Left 2018   leg, chest  . BASAL CELL CARCINOMA EXCISION Right 2019   leg  . BLADDER STONE REMOVAL  1986/87  . COLONOSCOPY  02/22/2016  . COLOSTOMY  02/22/2016  . CYSTOSCOPY WITH BIOPSY N/A 06/21/2018   Procedure: CYSTOSCOPY WITH BIOPSY  FULGURATION;  Surgeon: Irine Seal, MD;  Location: [facility];  Service: Urology;  Laterality: N/A;  . CYSTOSCOPY WITH LITHOLAPAXY N/A 06/21/2018   Procedure: CYSTOSCOPY WITH LITHOLAPAXY;  Surgeon: Irine Seal, MD;  Location: [facility];  Service: Urology;  Laterality: N/A;  . DORSAL CHEILECTOMY-HALLUX RIGIDUS Bilateral 2011  . HERNIA REPAIR  4098   VENTRAL umbilical hernia repair  . LAMINECTOMY  10/23/1979   AVM  . LAPAROSCOPIC SIGMOID COLECTOMY  02/22/2016  . [place]   left shoulder  . ROTATOR CUFF REPAIR Right 2001  . SHOULDER ARTHROSCOPY  2007   right  . SQUAMOUS CELL CARCINOMA EXCISION Left 2018   leg  . Toe Nails Removal Bilateral 1981   both great toes  . TOTAL SHOULDER REPLACEMENT   2001   left  . TRANSFER TENDON HAND  1982, 1983   to right hand X3  . VENTRAL HERNIA REPAIR  08/17/2010   Postop ileus (Dr. Redmond Pulling)    There were no vitals filed for this visit.   Subjective Assessment - 02/21/20 1406    Subjective "I saw the MD and he wants me to conitnue with strengthening to get the external rotation and endurance strength."    Patient Stated Goals Pt reports she wants to strengthen her R arm to improve her driving tolerance, to assist better with her bed mobility, and to protect her L TSR which she has had for 21 years.    Currently in Pain? No/denies              [facility] PT Assessment - 02/21/20 0001      Assessment   Medical Diagnosis Primary osteoarthritis, right shoulder; R-TSA, R    Referring Provider (PT) Douglass Rivers, MD                         [facility] Adult PT Treatment/Exercise - 02/21/20 0001      Shoulder Exercises: Seated   Retraction Strengthening;Right;10 reps   hodling 2-3 seconds   Retraction Limitations cues to stamp shoulder blade on to back of seat    Flexion Strengthening;Right;20 reps;Weights    Flexion Weight (lbs) 2    Flexion Limitations eccentric lowering, manual assist to redcue shoulder abduction throughout session.    Other Seated Exercises manual resistance with ER 2 x 15 working into end range , with manual assistace to maintain position shoulder at 90/90 ER with manual resistance 2 x 15   focusing no eccentric loading only    Other Seated Exercises circles  at shoulder CW/CCW 3 x 10 2#      Shoulder Exercises: Stretch   Other Shoulder Stretches pec / IR stretch LLLD 3 x 1 min    with shoulder abducted to 25 degrees   Other Shoulder Stretches levator scapulae stretch 2 x 30 sec R only      Manual Therapy   Manual therapy comments MTPR along R upper trap/ levator scapuale and pec minor/ anterior deltoid                  PT Education - 02/21/20 1459    Education Details to use the towel under the  arm when working on the shoulder ER to reduce overactivation of the deltoid.    Person(s) Educated Patient    Methods Explanation;Verbal cues;Handout    Comprehension Verbalized understanding;Verbal cues required            PT Short  Term Goals - 02/05/20 1519      PT SHORT TERM GOAL #1   Title Pt will be Ind in a HEP. Achieved    Baseline Started today    Status Achieved    Target Date 02/05/20             PT Long Term Goals - 02/05/20 1520      PT LONG TERM GOAL #2   Title Improve R shoulder AROM to flexion= 105d, abd =100d. Achieved    Baseline Flexion=95d; abd =90d    Target Date 02/05/20      Additional Long Term Goals   Additional Long Term Goals Yes      PT LONG TERM GOAL #7   Title Improved AROM for R sh flexion and abd to 115d or greater    Baseline Flexion 95d; abd 90d    Status New    Target Date 03/04/20                 Plan - 02/21/20 1500    Clinical Impression Statement pt arrived reporting she saw the MD and that he wanted her to continue with physical therapy to increase funcitonal reaching, endurnace and maximizeing external rotation ROM/ strenth. continued working on forward reaching with circles to mimick diriving with resistance, reaching overhead and shoulder ER with manual resistance. pt is very deltoid dominate which is expected following this type of surgery, she improved her ER when focusing on using a towel in the axillary space.    PT Next Visit Plan ERO next session for 1 x week for 6 weeks. Continue strengthening to improve tolerance and overhead use of the UE.    Consulted and Agree with Plan of Care Patient           Patient will benefit from skilled therapeutic intervention in order to improve the following deficits and impairments:  Decreased range of motion, Obesity, Decreased activity tolerance, Decreased strength, Decreased mobility, Decreased balance  Visit Diagnosis: Primary osteoarthritis, right shoulder  Status post  reverse total shoulder replacement, right  Decreased ROM of right shoulder  Weakness of shoulder     Problem List Patient Active Problem List   Diagnosis Date Noted  . Atopic dermatitis 11/18/2019  . Toe infection 08/12/2019  . Vaginal bleeding 07/18/2019  . Kidney stone 06/25/2018  . Colostomy status ([place]) 02/19/2018  . Right knee pain 04/09/2017  . Right hip pain 04/09/2017  . Headache 04/09/2017  . Depression 02/13/2017  . Obesity 11/01/2016  . Sleep apnea 01/31/2016  . Constipation 01/25/2016  . Hyperglycemia 05/18/2015  . Medicare annual wellness visit, subsequent 11/02/2014  . Preventative health care 06/26/2013  . Postmenopausal bleeding 12/17/2012  . Neurogenic bladder 12/01/2012  . Right shoulder pain 12/01/2012  . Mitral valve disorder 12/01/2012  . Rhinitis 01/03/2012  . CALLUS, RIGHT FOOT 10/30/2009  . ALOPECIA 10/06/2009  . INSOMNIA 08/04/2009  . Anemia 08/28/2008  . Hyperlipidemia 07/27/2008  . Essential hypertension 07/27/2008  . Incomplete quadriplegia at C5-6 level ([place]) 07/24/2008   Starr Lake PT, DPT, LAT, ATC  02/21/20  3:08 PM      [place] [address] [place], [place], [address] Phone: [phone]   Fax:  [phone]  Name: [name] MRN: [id number] Date of Birth: 1950-12-11

## 2020-02-24 ENCOUNTER — Other Ambulatory Visit (INDEPENDENT_AMBULATORY_CARE_PROVIDER_SITE_OTHER): Payer: Medicare PPO

## 2020-02-24 ENCOUNTER — Other Ambulatory Visit: Payer: Self-pay

## 2020-02-24 DIAGNOSIS — R739 Hyperglycemia, unspecified: Secondary | ICD-10-CM

## 2020-02-24 NOTE — Addendum Note (Signed)
Addended by: Kelle Darting A on: 02/24/2020 01:07 PM   Modules accepted: Orders

## 2020-02-25 LAB — COMPREHENSIVE METABOLIC PANEL
AG Ratio: 1.4 (calc) (ref 1.0–2.5)
ALT: 24 U/L (ref 6–29)
AST: 22 U/L (ref 10–35)
Albumin: 4.1 g/dL (ref 3.6–5.1)
Alkaline phosphatase (APISO): 93 U/L (ref 37–153)
BUN/Creatinine Ratio: 18 (calc) (ref 6–22)
BUN: 7 mg/dL (ref 7–25)
CO2: 27 mmol/L (ref 20–32)
Calcium: 9.1 mg/dL (ref 8.6–10.4)
Chloride: 98 mmol/L (ref 98–110)
Creat: 0.39 mg/dL — ABNORMAL LOW (ref 0.50–0.99)
Globulin: 2.9 g/dL (calc) (ref 1.9–3.7)
Glucose, Bld: 103 mg/dL — ABNORMAL HIGH (ref 65–99)
Potassium: 4.2 mmol/L (ref 3.5–5.3)
Sodium: 135 mmol/L (ref 135–146)
Total Bilirubin: 0.5 mg/dL (ref 0.2–1.2)
Total Protein: 7 g/dL (ref 6.1–8.1)

## 2020-02-25 LAB — HEMOGLOBIN A1C
Hgb A1c MFr Bld: 6.4 % of total Hgb — ABNORMAL HIGH (ref <5.7)
Mean Plasma Glucose: 137 (calc)
eAG (mmol/L): 7.6 (calc)

## 2020-02-28 ENCOUNTER — Other Ambulatory Visit: Payer: Self-pay | Admitting: Family Medicine

## 2020-02-28 ENCOUNTER — Other Ambulatory Visit: Payer: Self-pay

## 2020-02-28 ENCOUNTER — Ambulatory Visit: Payer: Medicare PPO

## 2020-02-28 DIAGNOSIS — M25611 Stiffness of right shoulder, not elsewhere classified: Secondary | ICD-10-CM | POA: Diagnosis not present

## 2020-02-28 DIAGNOSIS — Z96611 Presence of right artificial shoulder joint: Secondary | ICD-10-CM

## 2020-02-28 DIAGNOSIS — M19011 Primary osteoarthritis, right shoulder: Secondary | ICD-10-CM | POA: Diagnosis not present

## 2020-02-28 DIAGNOSIS — R29898 Other symptoms and signs involving the musculoskeletal system: Secondary | ICD-10-CM | POA: Diagnosis not present

## 2020-02-29 NOTE — Therapy (Signed)
[place], [place], [address] Phone: [phone]   Fax:  [phone]  Physical Therapy Treatment/Recert  Patient Details  Name: [name] MRN: [id number] Date of Birth: 08-Jan-1951 Referring Provider (PT): Douglass Rivers, MD   Encounter Date: 02/28/2020   PT End of Session - 02/29/20 1026    Visit Number 8    Number of Visits 14    Date for PT Re-Evaluation 03/20/20    Authorization Type HUMANA MEDICARE CHOICE PPO; MEDICAID OF [place]    PT Start Time 1962    PT Stop Time 2297    PT Time Calculation (min) 45 min    Activity Tolerance Patient tolerated treatment well    Behavior During Therapy [facility] for tasks assessed/performed           Past Medical History:  Diagnosis Date   Acute bronchitis 05/16/2015   Anemia    after shoulder replacement   Autonomic dysreflexia    AVM (arteriovenous malformation) spine 1981   Bowel dysfunction    constipation and fecal retention   Cerumen impaction 9/89/2119   Complication of anesthesia    low temperature after general anesthesia for colostomy   Constipation 01/25/2016   Contusion 05/18/2014   Of foot   Depression 02/13/2017   History of    Headache 04/09/2017   History of kidney stones    History of neurogenic bowel    History of rotator cuff tear    Right   History of ventral hernia    Hyperglycemia 05/18/2015   Hyperlipidemia    Hypertension    No medications at this time   Incomplete quadriplegia at C5-6 level ([place]) 07/24/2008   Qualifier: Diagnosis of  By: Wynona Luna Requires bowel regimen including enemas and indwelling foley cath Has had tendency towards pressure sores in past. Had a tear with a transfer in [facility] in past for surgery and developed a sore at that time.    Low blood pressure reading    Occassionally   Medicare annual wellness visit, subsequent 11/02/2014   Follows with [facility] Declines colonoscopy   No h/o abnormal pap, declines further paps   MVP (mitral valve prolapse) 12/01/2012   Mild   Neurogenic bladder    OA (osteoarthritis)    Hip and shoulders, neck   Obesity 11/01/2016   OSA on CPAP 01/31/2016   Pain in joint, shoulder region 12/01/2012   Long history B/l Follows with Dr Hassell Done [facility]   Postmenopausal bleeding 12/17/2012   Preventative health care 06/26/2013   Quadriplegia ([place]) 1981   Secondary to ruptured AVM C5-C6   Sacral pressure ulcer 12/01/2012   H/o Follows with Dr Tomi Likens of Dermatology   Skin cancer 11/01/2016   2 BCC RLE excised by Dr Wilhemina Bonito 2018 [place] LLE excised by Dr Wilhemina Bonito 2018   Vaginitis and vulvovaginitis 12/17/2012   Wears glasses     Past Surgical History:  Procedure Laterality Date   ANAL TAG REMOVAL  2014   BASAL CELL CARCINOMA EXCISION  2016   Lip   BASAL CELL CARCINOMA EXCISION  2018   right leg x2   BASAL CELL CARCINOMA EXCISION Left 2018   leg, chest   BASAL CELL CARCINOMA EXCISION Right 2019   leg   BLADDER STONE REMOVAL  1986/87   COLONOSCOPY  02/22/2016   COLOSTOMY  02/22/2016   CYSTOSCOPY WITH BIOPSY N/A 06/21/2018   Procedure: CYSTOSCOPY WITH BIOPSY  FULGURATION;  Surgeon: Irine Seal, MD;  Location: [facility];  Service: Urology;  Laterality: N/A;   CYSTOSCOPY WITH LITHOLAPAXY N/A 06/21/2018   Procedure: CYSTOSCOPY WITH LITHOLAPAXY;  Surgeon: Irine Seal, MD;  Location: [facility];  Service: Urology;  Laterality: N/A;   DORSAL CHEILECTOMY-HALLUX RIGIDUS Bilateral 2011   HERNIA REPAIR  9169   VENTRAL umbilical hernia repair   LAMINECTOMY  10/23/1979   AVM   LAPAROSCOPIC SIGMOID COLECTOMY  02/22/2016   ROTATOR CUFF REPAIR  1996, 1997   left shoulder   ROTATOR CUFF REPAIR Right 2001   SHOULDER ARTHROSCOPY  2007   right   SQUAMOUS CELL CARCINOMA EXCISION Left 2018   leg   Toe Nails Removal Bilateral 1981   both great toes   TOTAL SHOULDER  REPLACEMENT  2001   left   TRANSFER TENDON HAND  1982, 1983   to right hand X3   VENTRAL HERNIA REPAIR  08/17/2010   Postop ileus (Dr. Redmond Pulling)    There were no vitals filed for this visit.   Subjective Assessment - 02/28/20 1351    Subjective Pt reports she has been completing her HEP. Pt states she has been having some bladder an stomach issus today, so she might not be able to do as much. Pt reports her driving tolerance is improving.    Pertinent History Incomplete quadriplegia at C5-6 level (1981), L TSR 21 years ago, colostomy, indwelling urine catheter    Limitations Lifting;House hold activities;Other (comment)    Diagnostic tests R-TSR s osteolysis; moderate AC jt degeration    Patient Stated Goals Pt reports she wants to strengthen her R arm to improve her driving tolerance, to assist better with her bed mobility, and to protect her L TSR which she has had for 21 years.    Currently in Pain? No/denies              [facility] PT Assessment - 02/29/20 0001      AROM   Right Shoulder Flexion 111 Degrees    Right Shoulder ABduction 115 Degrees      Strength   Right Shoulder Flexion 4-/5    Right Shoulder ABduction 4-/5    Right Shoulder External Rotation 3+/5                         [facility] Adult PT Treatment/Exercise - 02/29/20 0001      Shoulder Exercises: Seated   Retraction Strengthening;Right;10 reps   hodling 2-3 seconds   Retraction Limitations cues to stamp shoulder blade on to back of seat    Row Strengthening;Right;Theraband;10 reps    Theraband Level (Shoulder Row) Level 3 (Green)   with band around elbow    Row Limitations 3 sets    Flexion Strengthening;Right;20 reps;Weights    Flexion Weight (lbs) 2    Flexion Limitations eccentric lowering, manual assist to redcue shoulder abduction throughout session.    Other Seated Exercises manual resistance with ER 2 x 15 working into end range , with manual assistace to maintain position shoulder at  90/90 ER with manual resistance 2 x 15   focusing no eccentric loading only    Other Seated Exercises circles  at shoulder CW/CCW 3 x 10 2#                    PT Short Term Goals - 02/05/20 1519      PT SHORT TERM GOAL #1   Title  Pt will be Ind in a HEP. Achieved    Baseline Started today    Status Achieved    Target Date 02/05/20             PT Long Term Goals - 02/29/20 1150      PT LONG TERM GOAL #1   Title Improved R shoulder strength to 4/5 or greater. On-going- flex 4-/5; abd 4-/5; ER 3+/5    Baseline 3 -3+/5    Status On-going    Target Date 04/17/20      PT LONG TERM GOAL #3   Title Pt will toleratedthe UBE of 10 mins at level 2 to demonstrate improve tolerance of the R shoulder. On-going    Status On-going    Target Date 04/17/20      PT LONG TERM GOAL #4   Title Pt will report improved tolerance of her R arm with driving to improve safety. On-going-pt is reporting improved tolerance    Baseline Decreased tolerance    Target Date 04/17/20      PT LONG TERM GOAL #5   Title Pt will report improved use of her R arm with bed mobility for needed changes in position at night for comfort and pressure relief. On-going    Baseline Unable to complete position changes    Status On-going    Target Date 04/17/20      PT LONG TERM GOAL #6   Title Pt will be Ind in a final HEP. on-going    Target Date 04/17/20      PT LONG TERM GOAL #7   Title Improved AROM for R sh flexion and abd to 115d or greater. On-going- 115d    Status On-going    Target Date 04/17/20                 Plan - 02/29/20 1200    Clinical Impression Statement Pt continues to make appropriate progress re: strength and functional use of the R UE. Re-assessment found strength and ROM to be improved. Pt will benefit from continued PT 1w6 to increase ROM, strength, and tolerance to maximize pt's functional use of her R UE.    Personal Factors and Comorbidities Fitness;Comorbidity  3+;Past/Current Experience;Comorbidity 1    Comorbidities Incomplete quadriplegia at C5-6 level (1981); Mitral valve disorder, depression, obesity    Examination-Activity Limitations Reach Overhead;Lift;Other    Stability/Clinical Decision Making Evolving/Moderate complexity    Clinical Decision Making Moderate    Rehab Potential Good    PT Frequency 1x / week    PT Treatment/Interventions ADLs/Self Care Home Management;Electrical Stimulation;Iontophoresis 4mg /ml Dexamethasone;Moist Heat;Ultrasound;Neuromuscular re-education;Balance training;Therapeutic exercise;Therapeutic activities;Patient/family education;Manual techniques;Taping;Dry needling;Passive range of motion    PT Next Visit Plan Continue PT to address R shoulder strength, ROM, and tolerance to maximize function.    PT Home Exercise Plan [email or web address]    Consulted and Agree with Plan of Care Patient           Patient will benefit from skilled therapeutic intervention in order to improve the following deficits and impairments:  Decreased range of motion, Obesity, Decreased activity tolerance, Decreased strength, Decreased mobility, Decreased balance  Visit Diagnosis: Primary osteoarthritis, right shoulder  Status post reverse total shoulder replacement, right  Decreased ROM of right shoulder  Weakness of shoulder     Problem List Patient Active Problem List   Diagnosis Date Noted   Atopic dermatitis 11/18/2019   Toe infection 08/12/2019   Vaginal bleeding 07/18/2019   Kidney stone 06/25/2018   Colostomy  status ([place]) 02/19/2018   Right knee pain 04/09/2017   Right hip pain 04/09/2017   Headache 04/09/2017   Depression 02/13/2017   Obesity 11/01/2016   Sleep apnea 01/31/2016   Constipation 01/25/2016   Hyperglycemia 05/18/2015   Medicare annual wellness visit, subsequent 11/02/2014   Preventative health care 06/26/2013   Postmenopausal bleeding 12/17/2012   Neurogenic bladder 12/01/2012   Right  shoulder pain 12/01/2012   Mitral valve disorder 12/01/2012   Rhinitis 01/03/2012   CALLUS, RIGHT FOOT 10/30/2009   ALOPECIA 10/06/2009   INSOMNIA 08/04/2009   Anemia 08/28/2008   Hyperlipidemia 07/27/2008   Essential hypertension 07/27/2008   Incomplete quadriplegia at C5-6 level ([place]) 07/24/2008    Gar Ponto MS, PT 02/29/20 12:07 PM  [name], [place], [address] Phone: [phone]   Fax:  [phone]  Name: [name] MRN: [id number] Date of Birth: [birth year]

## 2020-03-02 DIAGNOSIS — Z933 Colostomy status: Secondary | ICD-10-CM | POA: Diagnosis not present

## 2020-03-02 DIAGNOSIS — R32 Unspecified urinary incontinence: Secondary | ICD-10-CM | POA: Diagnosis not present

## 2020-03-02 DIAGNOSIS — G8250 Quadriplegia, unspecified: Secondary | ICD-10-CM | POA: Diagnosis not present

## 2020-03-07 DIAGNOSIS — R32 Unspecified urinary incontinence: Secondary | ICD-10-CM | POA: Diagnosis not present

## 2020-03-07 DIAGNOSIS — Z933 Colostomy status: Secondary | ICD-10-CM | POA: Diagnosis not present

## 2020-03-07 DIAGNOSIS — G8250 Quadriplegia, unspecified: Secondary | ICD-10-CM | POA: Diagnosis not present

## 2020-03-13 ENCOUNTER — Other Ambulatory Visit: Payer: Self-pay

## 2020-03-13 ENCOUNTER — Encounter: Payer: Self-pay | Admitting: Physical Therapy

## 2020-03-13 ENCOUNTER — Ambulatory Visit: Payer: Medicare PPO | Attending: Orthopaedic Surgery | Admitting: Physical Therapy

## 2020-03-13 DIAGNOSIS — R29898 Other symptoms and signs involving the musculoskeletal system: Secondary | ICD-10-CM | POA: Insufficient documentation

## 2020-03-13 DIAGNOSIS — M25611 Stiffness of right shoulder, not elsewhere classified: Secondary | ICD-10-CM | POA: Diagnosis not present

## 2020-03-13 DIAGNOSIS — Z96611 Presence of right artificial shoulder joint: Secondary | ICD-10-CM | POA: Insufficient documentation

## 2020-03-13 DIAGNOSIS — M19011 Primary osteoarthritis, right shoulder: Secondary | ICD-10-CM | POA: Insufficient documentation

## 2020-03-13 NOTE — Therapy (Signed)
[place], [place], [address] Phone: [phone]   Fax:  [phone]  Physical Therapy Treatment  Patient Details  Name: [name] MRN: [id number] Date of Birth: 1950-08-13 Referring Provider (PT): Douglass Rivers, MD   Encounter Date: 03/13/2020   PT End of Session - 03/13/20 1233    Visit Number 9    Number of Visits 14    Date for PT Re-Evaluation 03/20/20    Authorization Type [employer] MEDICARE CHOICE PPO; MEDICAID OF [place]    Progress Note Due on Visit 10    PT Start Time 1233    PT Stop Time 1313    PT Time Calculation (min) 40 min    Activity Tolerance Patient tolerated treatment well    Behavior During Therapy [facility] for tasks assessed/performed           Past Medical History:  Diagnosis Date  . Acute bronchitis 05/16/2015  . Anemia    after shoulder replacement  . Autonomic dysreflexia   . AVM (arteriovenous malformation) spine 1981  . Bowel dysfunction    constipation and fecal retention  . Cerumen impaction 10/11/2015  . Complication of anesthesia    low temperature after general anesthesia for colostomy  . Constipation 01/25/2016  . Contusion 05/18/2014   Of foot  . Depression 02/13/2017   History of   . Headache 04/09/2017  . History of kidney stones   . History of neurogenic bowel   . History of rotator cuff tear    Right  . History of ventral hernia   . Hyperglycemia 05/18/2015  . Hyperlipidemia   . Hypertension    No medications at this time  . Incomplete quadriplegia at C5-6 level ([place]) 07/24/2008   Qualifier: Diagnosis of  By: Wynona Luna Requires bowel regimen including enemas and indwelling foley cath Has had tendency towards pressure sores in past. Had a tear with a transfer in Hospital in past for surgery and developed a sore at that time.   . Low blood pressure reading    Occassionally  . Medicare annual wellness visit, subsequent 11/02/2014   Follows with [facility] Declines colonoscopy  No h/o abnormal pap, declines further paps  . MVP (mitral valve prolapse) 12/01/2012   Mild  . Neurogenic bladder   . OA (osteoarthritis)    Hip and shoulders, neck  . Obesity 11/01/2016  . OSA on CPAP 01/31/2016  . Pain in joint, shoulder region 12/01/2012   Long history B/l Follows with Dr Hassell Done at [facility]  . Postmenopausal bleeding 12/17/2012  . Preventative health care 06/26/2013  . Quadriplegia ([place]) 1981   Secondary to ruptured AVM C5-C6  . Sacral pressure ulcer 12/01/2012   H/o Follows with Dr Tomi Likens of Dermatology  . Skin cancer 11/01/2016   2 BCC RLE excised by Dr Wilhemina Bonito 2018 [place] LLE excised by Dr Wilhemina Bonito 2018  . Vaginitis and vulvovaginitis 12/17/2012  . Wears glasses     Past Surgical History:  Procedure Laterality Date  . ANAL TAG REMOVAL  2014  . BASAL CELL CARCINOMA EXCISION  2016   Lip  . BASAL CELL CARCINOMA EXCISION  2018   right leg x2  . BASAL CELL CARCINOMA EXCISION Left 2018   leg, chest  . BASAL CELL CARCINOMA EXCISION Right 2019   leg  . BLADDER STONE REMOVAL  1986/87  . COLONOSCOPY  02/22/2016  . COLOSTOMY  02/22/2016  . CYSTOSCOPY WITH  BIOPSY N/A 06/21/2018   Procedure: CYSTOSCOPY WITH BIOPSY FULGURATION;  Surgeon: Irine Seal, MD;  Location: [facility];  Service: Urology;  Laterality: N/A;  . CYSTOSCOPY WITH LITHOLAPAXY N/A 06/21/2018   Procedure: CYSTOSCOPY WITH LITHOLAPAXY;  Surgeon: Irine Seal, MD;  Location: [facility];  Service: Urology;  Laterality: N/A;  . DORSAL CHEILECTOMY-HALLUX RIGIDUS Bilateral 2011  . HERNIA REPAIR  [id number]   VENTRAL umbilical hernia repair  . LAMINECTOMY  10/23/1979   AVM  . LAPAROSCOPIC SIGMOID COLECTOMY  02/22/2016  . [place]   left shoulder  . ROTATOR CUFF REPAIR Right 2001  . SHOULDER ARTHROSCOPY  2007   right  . SQUAMOUS CELL CARCINOMA EXCISION Left 2018   leg  . Toe Nails Removal Bilateral 1981   both great  toes  . TOTAL SHOULDER REPLACEMENT  2001   left  . TRANSFER TENDON HAND  1982, 1983   to right hand X3  . VENTRAL HERNIA REPAIR  08/17/2010   Postop ileus (Dr. Redmond Pulling)    There were no vitals filed for this visit.   Subjective Assessment - 03/13/20 1234    Subjective " I am doing the exercises but I don't kno wif I am making more gaines or note"    Diagnostic tests R-TSR s osteolysis; moderate AC jt degeration    Currently in Pain? No/denies              [facility] PT Assessment - 03/13/20 0001      Assessment   Medical Diagnosis Primary osteoarthritis, right shoulder; R-TSA, R    Referring Provider (PT) Douglass Rivers, MD                         [facility] Adult PT Treatment/Exercise - 03/13/20 0001      Shoulder Exercises: Seated   External Rotation Strengthening;Right   in neutral 2 x 2 min    Theraband Level (Shoulder External Rotation) Other (comment)   manual resistance   External Rotation Limitations 3 sets; elbow resting on black bolster with AAROM for ER and eccentric control     Flexion Strengthening;Right    Flexion Limitations eccentric lowering, manual assist to redcue shoulder abduction,. workin gon to fatigue starting with 4# x 2 min , 3#, x 2 min, 2# x 2 min       Shoulder Exercises: Stretch   Other Shoulder Stretches PNF contract relax of shoulder internal rotators with 10 sec contraction and 30 sec stretch x 3      Manual Therapy   Manual Therapy Joint mobilization    Joint Mobilization seated GHJ PA/ AP grade III                     PT Short Term Goals - 02/05/20 1519      PT SHORT TERM GOAL #1   Title Pt will be Ind in a HEP. Achieved    Baseline Started today    Status Achieved    Target Date 02/05/20             PT Long Term Goals - 02/29/20 1150      PT LONG TERM GOAL #1   Title Improved R shoulder strength to 4/5 or greater. On-going- flex 4-/5; abd 4-/5; ER 3+/5    Baseline 3 -3+/5    Status On-going    Target  Date 04/17/20      PT LONG TERM GOAL #  3   Title Pt will toleratedthe UBE of 10 mins at level 2 to demonstrate improve tolerance of the R shoulder. On-going    Status On-going    Target Date 04/17/20      PT LONG TERM GOAL #4   Title Pt will report improved tolerance of her R arm with driving to improve safety. On-going-pt is reporting improved tolerance    Baseline Decreased tolerance    Target Date 04/17/20      PT LONG TERM GOAL #5   Title Pt will report improved use of her R arm with bed mobility for needed changes in position at night for comfort and pressure relief. On-going    Baseline Unable to complete position changes    Status On-going    Target Date 04/17/20      PT LONG TERM GOAL #6   Title Pt will be Ind in a final HEP. on-going    Target Date 04/17/20      PT LONG TERM GOAL #7   Title Improved AROM for R sh flexion and abd to 115d or greater. On-going- 115d    Status On-going    Target Date 04/17/20                 Plan - 03/13/20 1427    Clinical Impression Statement Worked on shoulder ER using seated GHJ AP/PA mobs to promote ROM and MTPR peri-scapular. focused on shoulder ER with Manual resistance in both neutral and at 90/90 position. challenged shoulder flexion working to fatigue graduating weight as she fatigues. end of session she reported no pain but did not signifcant fatigue.    PT Treatment/Interventions ADLs/Self Care Home Management;Electrical Stimulation;Iontophoresis 4mg /ml Dexamethasone;Moist Heat;Ultrasound;Neuromuscular re-education;Balance training;Therapeutic exercise;Therapeutic activities;Patient/family education;Manual techniques;Taping;Dry needling;Passive range of motion    PT Next Visit Plan Continue PT to address R shoulder strength, ROM, and tolerance to maximize function.    PT Home Exercise Plan [email or web address]    Consulted and Agree with Plan of Care Patient           Patient will benefit from skilled therapeutic intervention in  order to improve the following deficits and impairments:  Decreased range of motion, Obesity, Decreased activity tolerance, Decreased strength, Decreased mobility, Decreased balance  Visit Diagnosis: Primary osteoarthritis, right shoulder  Status post reverse total shoulder replacement, right  Decreased ROM of right shoulder  Weakness of shoulder     Problem List Patient Active Problem List   Diagnosis Date Noted  . Atopic dermatitis 11/18/2019  . Toe infection 08/12/2019  . Vaginal bleeding 07/18/2019  . Kidney stone 06/25/2018  . Colostomy status ([place]) 02/19/2018  . Right knee pain 04/09/2017  . Right hip pain 04/09/2017  . Headache 04/09/2017  . Depression 02/13/2017  . Obesity 11/01/2016  . Sleep apnea 01/31/2016  . Constipation 01/25/2016  . Hyperglycemia 05/18/2015  . Medicare annual wellness visit, subsequent 11/02/2014  . Preventative health care 06/26/2013  . Postmenopausal bleeding 12/17/2012  . Neurogenic bladder 12/01/2012  . Right shoulder pain 12/01/2012  . Mitral valve disorder 12/01/2012  . Rhinitis 01/03/2012  . CALLUS, RIGHT FOOT 10/30/2009  . ALOPECIA 10/06/2009  . INSOMNIA 08/04/2009  . Anemia 08/28/2008  . Hyperlipidemia 07/27/2008  . Essential hypertension 07/27/2008  . Incomplete quadriplegia at C5-6 level ([place]) 07/24/2008    Starr Lake PT, DPT, LAT, ATC  03/13/20  2:32 PM      [facility] [address] [place], [place], [address] Phone: [phone]   Fax:  [phone]  Name: [name] MRN: [id number] Date of Birth: 1950/09/04

## 2020-03-16 DIAGNOSIS — H02831 Dermatochalasis of right upper eyelid: Secondary | ICD-10-CM | POA: Diagnosis not present

## 2020-03-16 DIAGNOSIS — H16223 Keratoconjunctivitis sicca, not specified as Sjogren's, bilateral: Secondary | ICD-10-CM | POA: Diagnosis not present

## 2020-03-16 DIAGNOSIS — H2513 Age-related nuclear cataract, bilateral: Secondary | ICD-10-CM | POA: Diagnosis not present

## 2020-03-16 DIAGNOSIS — H02834 Dermatochalasis of left upper eyelid: Secondary | ICD-10-CM | POA: Diagnosis not present

## 2020-03-18 ENCOUNTER — Encounter: Payer: Medicare PPO | Admitting: Physical Therapy

## 2020-03-20 ENCOUNTER — Encounter: Payer: Self-pay | Admitting: Physical Therapy

## 2020-03-20 ENCOUNTER — Other Ambulatory Visit: Payer: Self-pay

## 2020-03-20 ENCOUNTER — Ambulatory Visit: Payer: Medicare PPO | Admitting: Physical Therapy

## 2020-03-20 DIAGNOSIS — M19011 Primary osteoarthritis, right shoulder: Secondary | ICD-10-CM | POA: Diagnosis not present

## 2020-03-20 DIAGNOSIS — Z96611 Presence of right artificial shoulder joint: Secondary | ICD-10-CM

## 2020-03-20 DIAGNOSIS — R29898 Other symptoms and signs involving the musculoskeletal system: Secondary | ICD-10-CM | POA: Diagnosis not present

## 2020-03-20 DIAGNOSIS — M25611 Stiffness of right shoulder, not elsewhere classified: Secondary | ICD-10-CM | POA: Diagnosis not present

## 2020-03-20 NOTE — Therapy (Signed)
[place], [place], [address] Phone: [phone]   Fax:  [phone]  Physical Therapy Treatment  Patient Details  Name: [name] MRN: [id number] Date of Birth: [birth year] Referring Provider (PT): Douglass Rivers, MD   Encounter Date: 03/20/2020   PT End of Session - 03/20/20 1231    Visit Number 10    Number of Visits 14    Date for PT Re-Evaluation 03/20/20    Authorization Type HUMANA MEDICARE CHOICE PPO; MEDICAID OF [place]    Progress Note Due on Visit 20    PT Start Time 1231    PT Stop Time 1315    PT Time Calculation (min) 44 min    Activity Tolerance Patient tolerated treatment well    Behavior During Therapy [facility] for tasks assessed/performed           Past Medical History:  Diagnosis Date  . Acute bronchitis 05/16/2015  . Anemia    after shoulder replacement  . Autonomic dysreflexia   . AVM (arteriovenous malformation) spine 1981  . Bowel dysfunction    constipation and fecal retention  . Cerumen impaction 10/11/2015  . Complication of anesthesia    low temperature after general anesthesia for colostomy  . Constipation 01/25/2016  . Contusion 05/18/2014   Of foot  . Depression 02/13/2017   History of   . Headache 04/09/2017  . History of kidney stones   . History of neurogenic bowel   . History of rotator cuff tear    Right  . History of ventral hernia   . Hyperglycemia 05/18/2015  . Hyperlipidemia   . Hypertension    No medications at this time  . Incomplete quadriplegia at C5-6 level ([place]) 07/24/2008   Qualifier: Diagnosis of  By: Wynona Luna Requires bowel regimen including enemas and indwelling foley cath Has had tendency towards pressure sores in past. Had a tear with a transfer in Hospital in past for surgery and developed a sore at that time.   . Low blood pressure reading    Occassionally  . Medicare annual wellness visit, subsequent 11/02/2014   Follow[facility] with [facility] Declines colonoscopy  No h/o abnormal pap, declines further paps  . MVP (mitral valve prolapse) 12/01/2012   Mild  . Neurogenic bladder   . OA (osteoarthritis)    Hip and shoulders, neck  . Obesity 11/01/2016  . OSA on CPAP 01/31/2016  . Pain in joint, shoulder region 12/01/2012   Long history B/l Follows with Dr Hassell Done at [facility]  . Postmenopausal bleeding 12/17/2012  . Preventative health care 06/26/2013  . Quadriplegia ([place]) 1981   Secondary to ruptured AVM C5-C6  . Sacral pressure ulcer 12/01/2012   H/o Follows with Dr Tomi Likens of Dermatology  . Skin cancer 11/01/2016   2 BCC RLE excised by Dr Wilhemina Bonito 2018 [place] LLE excised by Dr Wilhemina Bonito 2018  . Vaginitis and vulvovaginitis 12/17/2012  . Wears glasses     Past Surgical History:  Procedure Laterality Date  . ANAL TAG REMOVAL  2014  . BASAL CELL CARCINOMA EXCISION  2016   Lip  . BASAL CELL CARCINOMA EXCISION  2018   right leg x2  . BASAL CELL CARCINOMA EXCISION Left 2018   leg, chest  . BASAL CELL CARCINOMA EXCISION Right 2019   leg  . BLADDER STONE REMOVAL  1986/87  . COLONOSCOPY  02/22/2016  . COLOSTOMY  02/22/2016  . CYSTOSCOPY WITH  BIOPSY N/A 06/21/2018   Procedure: CYSTOSCOPY WITH BIOPSY FULGURATION;  Surgeon: Irine Seal, MD;  Location[facility];  Service: Urology;  Laterality: N/A;  . CYSTOSCOPY WITH LITHOLAPAXY N/A 06/21/2018   Procedure: CYSTOSCOPY WITH LITHOLAPAXY;  Surgeon: Irine Seal, MD;  Location: [facility];  Service: Urology;  Laterality: N/A;  . DORSAL CHEILECTOMY-HALLUX RIGIDUS Bilateral 2011  . HERNIA REPAIR  [id number]   VENTRAL umbilical hernia repair  . LAMINECTOMY  10/23/1979   AVM  . LAPAROSCOPIC SIGMOID COLECTOMY  02/22/2016  . [place]   left shoulder  . ROTATOR CUFF REPAIR Right 2001  . SHOULDER ARTHROSCOPY  2007   right  . SQUAMOUS CELL CARCINOMA EXCISION Left 2018   leg  . Toe Nails Removal Bilateral 1981   both great  toes  . TOTAL SHOULDER REPLACEMENT  2001   left  . TRANSFER TENDON HAND  1982, 1983   to right hand X3  . VENTRAL HERNIA REPAIR  08/17/2010   Postop ileus (Dr. Redmond Pulling)    There were no vitals filed for this visit.   Subjective Assessment - 03/20/20 1231    Subjective "I am little sore today, not sure if it was from the last session or from doing it at home."    Patient Stated Goals Pt reports she wants to strengthen her R arm to improve her driving tolerance, to assist better with her bed mobility, and to protect her L TSR which she has had for 21 years.    Currently in Pain? Yes    Pain Score 5     Pain Location Shoulder    Pain Orientation Right    Pain Descriptors / Indicators Sore    Pain Type Chronic pain              [facility] PT Assessment - 03/20/20 0001      Assessment   Medical Diagnosis Primary osteoarthritis, right shoulder; R-TSA, R    Referring Provider (PT) Douglass Rivers, MD                         [facility] Adult PT Treatment/Exercise - 03/20/20 0001      Shoulder Exercises: Seated   External Rotation Strengthening;Right   at 90/90 with elbow propped on bolster   Theraband Level (Shoulder External Rotation) --   with manual resistance focusing on eccentrics.    Flexion Strengthening;15 reps   holding over head position x 5 sec ea     Shoulder Exercises: Isometric Strengthening   External Rotation 5X5"   using LUE to to provide resistance.      Manual Therapy   Manual therapy comments MTPR along R upper trap/ levator scapuale and pec minor/ anterior deltoid    Joint Mobilization seated GHJ PA/ AP grade III                     PT Short Term Goals - 02/05/20 1519      PT SHORT TERM GOAL #1   Title Pt will be Ind in a HEP. Achieved    Baseline Started today    Status Achieved    Target Date 02/05/20             PT Long Term Goals - 03/20/20 1318      PT LONG TERM GOAL #1   Title Improved R shoulder strength to 4/5 or  greater. On-going- flex 4-/5; abd 4-/5; ER  3+/5    Period Weeks    Status On-going      PT LONG TERM GOAL #2   Title Improve R shoulder AROM to flexion= 105d, abd =100d. Achieved    Period Weeks    Status On-going      PT LONG TERM GOAL #3   Title Pt will toleratedthe UBE of 10 mins at level 2 to demonstrate improve tolerance of the R shoulder. On-going    Period Weeks    Status Unable to assess      PT LONG TERM GOAL #4   Title Pt will report improved tolerance of her R arm with driving to improve safety. On-going-pt is reporting improved tolerance    Period Weeks    Status On-going      PT LONG TERM GOAL #5   Title Pt will report improved use of her R arm with bed mobility for needed changes in position at night for comfort and pressure relief. On-going    Period Weeks    Status On-going      PT LONG TERM GOAL #6   Title Pt will be Ind in a final HEP. on-going    Period Weeks      PT LONG TERM GOAL #7   Title Improved AROM for R sh flexion and abd to 115d or greater. On-going- 115d    Period Weeks    Status On-going                 Plan - 03/20/20 1319    Clinical Impression Statement pt reports soreness coming into session today which she is unsure if it is from last session or doing exercises at home. continued GHJ mobs to promote ROM and strengthening continued challenging endurnace. End of session she reported feeling looser.    Comorbidities Incomplete quadriplegia at C5-6 level (1981); Mitral valve disorder, depression, obesity    PT Treatment/Interventions ADLs/Self Care Home Management;Electrical Stimulation;Iontophoresis 4mg /ml Dexamethasone;Moist Heat;Ultrasound;Neuromuscular re-education;Balance training;Therapeutic exercise;Therapeutic activities;Patient/family education;Manual techniques;Taping;Dry needling;Passive range of motion    PT Next Visit Plan Continue PT to address R shoulder strength, ROM, and tolerance to maximize function. working on extension  from a flexed pos to promote bed mobility    PT Home Exercise Plan [email or web address]    Consulted and Agree with Plan of Care Patient           Patient will benefit from skilled therapeutic intervention in order to improve the following deficits and impairments:  Decreased range of motion, Obesity, Decreased activity tolerance, Decreased strength, Decreased mobility, Decreased balance  Visit Diagnosis: Primary osteoarthritis, right shoulder  Status post reverse total shoulder replacement, right  Decreased ROM of right shoulder  Weakness of shoulder     Problem List Patient Active Problem List   Diagnosis Date Noted  . Atopic dermatitis 11/18/2019  . Toe infection 08/12/2019  . Vaginal bleeding 07/18/2019  . Kidney stone 06/25/2018  . Colostomy status ([place]) 02/19/2018  . Right knee pain 04/09/2017  . Right hip pain 04/09/2017  . Headache 04/09/2017  . Depression 02/13/2017  . Obesity 11/01/2016  . Sleep apnea 01/31/2016  . Constipation 01/25/2016  . Hyperglycemia 05/18/2015  . Medicare annual wellness visit, subsequent 11/02/2014  . Preventative health care 06/26/2013  . Postmenopausal bleeding 12/17/2012  . Neurogenic bladder 12/01/2012  . Right shoulder pain 12/01/2012  . Mitral valve disorder 12/01/2012  . Rhinitis 01/03/2012  . CALLUS, RIGHT FOOT 10/30/2009  . ALOPECIA 10/06/2009  . INSOMNIA 08/04/2009  . Anemia 08/28/2008  .  Hyperlipidemia 07/27/2008  . Essential hypertension 07/27/2008  . Incomplete quadriplegia at C5-6 level ([place]) 07/24/2008    Starr Lake PT, DPT, LAT, ATC  03/20/20  1:24 PM      [facility] [address] [place], [place], [address] Phone: [phone]   Fax:  [phone]  Name: [name] MRN: [id number] Date of Birth: 01-03-1951

## 2020-03-25 ENCOUNTER — Telehealth: Payer: Self-pay

## 2020-03-25 NOTE — Telephone Encounter (Signed)
Caller states she would like to get her flu shot on Tuesday next week. Telephone: [phone]

## 2020-03-25 NOTE — Telephone Encounter (Signed)
Called patient, patient notified that we do not have flu clinic on Tuesday next week. Patient opted to go pharmacy

## 2020-03-26 ENCOUNTER — Encounter: Payer: Self-pay | Admitting: Physical Therapy

## 2020-03-26 ENCOUNTER — Other Ambulatory Visit: Payer: Self-pay

## 2020-03-26 ENCOUNTER — Ambulatory Visit: Payer: Medicare PPO | Admitting: Physical Therapy

## 2020-03-26 DIAGNOSIS — R29898 Other symptoms and signs involving the musculoskeletal system: Secondary | ICD-10-CM

## 2020-03-26 DIAGNOSIS — M19011 Primary osteoarthritis, right shoulder: Secondary | ICD-10-CM

## 2020-03-26 DIAGNOSIS — M25611 Stiffness of right shoulder, not elsewhere classified: Secondary | ICD-10-CM

## 2020-03-26 DIAGNOSIS — Z96611 Presence of right artificial shoulder joint: Secondary | ICD-10-CM

## 2020-03-26 NOTE — Therapy (Signed)
[place], [place], [address] Phone: [phone]   Fax:  [phone]  Physical Therapy Treatment / Re-certification  Patient Details  Name: [name] MRN: [id number] Date of Birth: 04/20/1951 Referring Provider (PT): Douglass Rivers, MD   Encounter Date: 03/26/2020   PT End of Session - 03/26/20 1415    Visit Number 11    Number of Visits 14    Date for PT Re-Evaluation 04/10/20    Authorization Type HU[employer] MEDICARE CHOICE PPO; MEDICAID OF [place]    Progress Note Due on Visit 20    PT Start Time 1415    PT Stop Time 1458    PT Time Calculation (min) 43 min    Activity Tolerance Patient tolerated treatment well    Behavior During Therapy [facility] for tasks assessed/performed           Past Medical History:  Diagnosis Date  . Acute bronchitis 05/16/2015  . Anemia    after shoulder replacement  . Autonomic dysreflexia   . AVM (arteriovenous malformation) spine 1981  . Bowel dysfunction    constipation and fecal retention  . Cerumen impaction 10/11/2015  . Complication of anesthesia    low temperature after general anesthesia for colostomy  . Constipation 01/25/2016  . Contusion 05/18/2014   Of foot  . Depression 02/13/2017   History of   . Headache 04/09/2017  . History of kidney stones   . History of neurogenic bowel   . History of rotator cuff tear    Right  . History of ventral hernia   . Hyperglycemia 05/18/2015  . Hyperlipidemia   . Hypertension    No medications at this time  . Incomplete quadriplegia at C5-6 level ([place]) 07/24/2008   Qualifier: Diagnosis of  By: Wynona Luna Requires bowel regimen including enemas and indwelling foley cath Has had tendency towards pressure sores in past. Had a tear with a transfer in Hospital in past for surgery and developed a sore at that time.   . Low blood pressure reading    Occassionally  . Medicare annual wellness visit, subsequent 11/02/2014    Follows with [facility] Declines colonoscopy  No h/o abnormal pap, declines further paps  . MVP (mitral valve prolapse) 12/01/2012   Mild  . Neurogenic bladder   . OA (osteoarthritis)    Hip and shoulders, neck  . Obesity 11/01/2016  . OSA on CPAP 01/31/2016  . Pain in joint, shoulder region 12/01/2012   Long history B/l Follows with Dr Hassell Done at [facility]  . Postmenopausal bleeding 12/17/2012  . Preventative health care 06/26/2013  . Quadriplegia ([place]) 1981   Secondary to ruptured AVM C5-C6  . Sacral pressure ulcer 12/01/2012   H/o Follows with Dr Tomi Likens of Dermatology  . Skin cancer 11/01/2016   2 BCC RLE excised by Dr Wilhemina Bonito 2018 [place] LLE excised by Dr Wilhemina Bonito 2018  . Vaginitis and vulvovaginitis 12/17/2012  . Wears glasses     Past Surgical History:  Procedure Laterality Date  . ANAL TAG REMOVAL  2014  . BASAL CELL CARCINOMA EXCISION  2016   Lip  . BASAL CELL CARCINOMA EXCISION  2018   right leg x2  . BASAL CELL CARCINOMA EXCISION Left 2018   leg, chest  . BASAL CELL CARCINOMA EXCISION Right 2019   leg  . BLADDER STONE REMOVAL  1986/87  . COLONOSCOPY  02/22/2016  . COLOSTOMY  02/22/2016  .  CYSTOSCOPY WITH BIOPSY N/A 06/21/2018   Procedure: CYSTOSCOPY WITH BIOPSY FULGURATION;  Surgeon: Irine Seal, MD;  Location: [facility];  Service: Urology;  Laterality: N/A;  . CYSTOSCOPY WITH LITHOLAPAXY N/A 06/21/2018   Procedure: CYSTOSCOPY WITH LITHOLAPAXY;  Surgeon: Irine Seal, MD;  Location: [facility];  Service: Urology;  Laterality: N/A;  . DORSAL CHEILECTOMY-HALLUX RIGIDUS Bilateral 2011  . HERNIA REPAIR  0277   VENTRAL umbilical hernia repair  . LAMINECTOMY  10/23/1979   AVM  . LAPAROSCOPIC SIGMOID COLECTOMY  02/22/2016  . [place]   left shoulder  . ROTATOR CUFF REPAIR Right 2001  . SHOULDER ARTHROSCOPY  2007   right  . SQUAMOUS CELL CARCINOMA EXCISION Left 2018   leg  . Toe Nails Removal  Bilateral 1981   both great toes  . TOTAL SHOULDER REPLACEMENT  2001   left  . TRANSFER TENDON HAND  1982, 1983   to right hand X3  . VENTRAL HERNIA REPAIR  08/17/2010   Postop ileus (Dr. Redmond Pulling)    There were no vitals filed for this visit.   Subjective Assessment - 03/26/20 1418    Subjective " I was pretty sore after the last session, which today is about a 2/10 with reaching overhead."    Patient Stated Goals Pt reports she wants to strengthen her R arm to improve her driving tolerance, to assist better with her bed mobility, and to protect her L TSR which she has had for 21 years.    Currently in Pain? Yes    Pain Score 2     Pain Location Shoulder    Pain Orientation Right    Pain Descriptors / Indicators Aching    Pain Type Chronic pain    Aggravating Factors  reaching overhead              [facility] PT Assessment - 03/26/20 0001      Assessment   Medical Diagnosis Primary osteoarthritis, right shoulder; R-TSA, R    Referring Provider (PT) Douglass Rivers, MD      AROM   Right Shoulder Flexion 111 Degrees    Right Shoulder ABduction 115 Degrees                         [facility] Adult PT Treatment/Exercise - 03/26/20 0001      Shoulder Exercises: Seated   External Rotation Strengthening;Right   with shoulder at 90/90   Theraband Level (Shoulder External Rotation) Other (comment)   manual resistance    Other Seated Exercises freemotion pulling down from overhead 20#, 4 x 10 pulling down at different angles.  free motion bicep curl 2 x 10 7#,  tricept extension 2 x 10 7#      Shoulder Exercises: Stretch   Other Shoulder Stretches PNF contract relax of shoulder internal rotators with 10 sec contraction and 30 sec stretch x 3      Manual Therapy   Manual therapy comments MTPR along R upper trap/ levator scapuale and pec minor/ anterior deltoid    Joint Mobilization seated GHJ PA/ AP grade III                     PT Short Term Goals - 02/05/20  1519      PT SHORT TERM GOAL #1   Title Pt will be Ind in a HEP. Achieved    Baseline Started today    Status Achieved  Target Date 02/05/20             PT Long Term Goals - 03/26/20 1603      PT LONG TERM GOAL #1   Title Improved R shoulder strength to 4/5 or greater. On-going- flex 4-/5; abd 4-/5; ER 3+/5    Period Weeks    Status On-going      PT LONG TERM GOAL #2   Title Improve R shoulder AROM to flexion= 105d, abd =100d. Achieved    Period Weeks    Status Achieved      PT LONG TERM GOAL #3   Title Pt will toleratedthe UBE of 10 mins at level 2 to demonstrate improve tolerance of the R shoulder. On-going    Baseline unable to perform due to her power W/C has a bolt on the bottom of her chair limiting her ability to use are bike.    Period Weeks    Status Unable to assess      PT LONG TERM GOAL #4   Title Pt will report improved tolerance of her R arm with driving to improve safety. On-going-pt is reporting improved tolerance    Period Weeks      PT LONG TERM GOAL #5   Title Pt will report improved use of her R arm with bed mobility for needed changes in position at night for comfort and pressure relief. On-going    Period Weeks    Status On-going      PT LONG TERM GOAL #6   Title Pt will be Ind in a final HEP. on-going    Period Weeks    Status On-going      PT LONG TERM GOAL #7   Title Improved AROM for R sh flexion and abd to 115d or greater. On-going- 115d    Status Partially Met                 Plan - 03/26/20 1510    Clinical Impression Statement pt arrived reporting increased soreness since the last session. pt continues to maintain her current ROM, and continues to demonstrate limited ER which per this type of surgery is WNL especially with the absence of the the external rotators and the deltoid being the primary mover. Continued STW and shoulder mobs to maximize ROM. focused remainder of session with function reaching and pull to mimick  rolling over in bed using loop strap over head. plan to see pt for the remaining 2 visits to work on functional strength/ mobility and upgrade any HEP and address questions PRN.    PT Frequency 1x / week    PT Duration 2 weeks    PT Treatment/Interventions ADLs/Self Care Home Management;Electrical Stimulation;Iontophoresis 46m/ml Dexamethasone;Moist Heat;Ultrasound;Neuromuscular re-education;Balance training;Therapeutic exercise;Therapeutic activities;Patient/family education;Manual techniques;Taping;Dry needling;Passive range of motion    PT Next Visit Plan Continue PT to address R shoulder strength, ROM, and tolerance to maximize function. working on extension from a flexed pos to promote bed mobility    Consulted and Agree with Plan of Care Patient           Patient will benefit from skilled therapeutic intervention in order to improve the following deficits and impairments:  Decreased range of motion, Obesity, Decreased activity tolerance, Decreased strength, Decreased mobility, Decreased balance  Visit Diagnosis: Primary osteoarthritis, right shoulder  Status post reverse total shoulder replacement, right  Decreased ROM of right shoulder  Weakness of shoulder     Problem List Patient Active Problem List   Diagnosis Date Noted  .  Atopic dermatitis 11/18/2019  . Toe infection 08/12/2019  . Vaginal bleeding 07/18/2019  . Kidney stone 06/25/2018  . Colostomy status ([place]) 02/19/2018  . Right knee pain 04/09/2017  . Right hip pain 04/09/2017  . Headache 04/09/2017  . Depression 02/13/2017  . Obesity 11/01/2016  . Sleep apnea 01/31/2016  . Constipation 01/25/2016  . Hyperglycemia 05/18/2015  . Medicare annual wellness visit, subsequent 11/02/2014  . Preventative health care 06/26/2013  . Postmenopausal bleeding 12/17/2012  . Neurogenic bladder 12/01/2012  . Right shoulder pain 12/01/2012  . Mitral valve disorder 12/01/2012  . Rhinitis 01/03/2012  . CALLUS, RIGHT FOOT  10/30/2009  . ALOPECIA 10/06/2009  . INSOMNIA 08/04/2009  . Anemia 08/28/2008  . Hyperlipidemia 07/27/2008  . Essential hypertension 07/27/2008  . Incomplete quadriplegia at C5-6 level ([place]) 07/24/2008   Starr Lake PT, DPT, LAT, ATC  03/26/20  4:13 PM      [facility] [address] [place], [place], [address] Phone: [phone]   Fax:  [phone]  Name: [name] MRN: [id number] Date of Birth: 1950/09/10

## 2020-03-31 ENCOUNTER — Other Ambulatory Visit (HOSPITAL_BASED_OUTPATIENT_CLINIC_OR_DEPARTMENT_OTHER): Payer: Self-pay | Admitting: Internal Medicine

## 2020-03-31 MED FILL — FLUAD QUADRIVALENT 0.5 ML P: 0.5 | 1 days supply | Qty: 1 | Fill #0

## 2020-04-03 ENCOUNTER — Encounter: Payer: Self-pay | Admitting: Physical Therapy

## 2020-04-03 ENCOUNTER — Other Ambulatory Visit: Payer: Self-pay

## 2020-04-03 ENCOUNTER — Ambulatory Visit: Payer: Medicare PPO | Admitting: Physical Therapy

## 2020-04-03 DIAGNOSIS — R32 Unspecified urinary incontinence: Secondary | ICD-10-CM | POA: Diagnosis not present

## 2020-04-03 DIAGNOSIS — Z96611 Presence of right artificial shoulder joint: Secondary | ICD-10-CM

## 2020-04-03 DIAGNOSIS — M25611 Stiffness of right shoulder, not elsewhere classified: Secondary | ICD-10-CM

## 2020-04-03 DIAGNOSIS — R29898 Other symptoms and signs involving the musculoskeletal system: Secondary | ICD-10-CM

## 2020-04-03 DIAGNOSIS — G8250 Quadriplegia, unspecified: Secondary | ICD-10-CM | POA: Diagnosis not present

## 2020-04-03 DIAGNOSIS — M19011 Primary osteoarthritis, right shoulder: Secondary | ICD-10-CM

## 2020-04-03 DIAGNOSIS — Z933 Colostomy status: Secondary | ICD-10-CM | POA: Diagnosis not present

## 2020-04-03 NOTE — Therapy (Signed)
[facility] [address] [place], [place], [address] Phone: [phone]   Fax:  [phone]  Physical Therapy Treatment  Patient Details  Name: [name] MRN: [id number] Date of Birth: 07/22/1950 Referring Provider (PT): Waterman, Brian, MD   Encounter Date: 04/03/2020   PT End of Session - 04/03/20 1400    Visit Number 12    Number of Visits 14    Date for PT Re-Evaluation 04/10/20    Authorization Type HUMANA MEDICARE CHOICE PPO; MEDICAID OF [place]    Progress Note Due on Visit 20    PT Start Time 1401    PT Stop Time 1445    PT Time Calculation (min) 44 min    Activity Tolerance Patient tolerated treatment well    Behavior During Therapy WFL for tasks assessed/performed           Past Medical History:  Diagnosis Date  . Acute bronchitis 05/16/2015  . Anemia    after shoulder replacement  . Autonomic dysreflexia   . AVM (arteriovenous malformation) spine 1981  . Bowel dysfunction    constipation and fecal retention  . Cerumen impaction 10/11/2015  . Complication of anesthesia    low temperature after general anesthesia for colostomy  . Constipation 01/25/2016  . Contusion 05/18/2014   Of foot  . Depression 02/13/2017   History of   . Headache 04/09/2017  . History of kidney stones   . History of neurogenic bowel   . History of rotator cuff tear    Right  . History of ventral hernia   . Hyperglycemia 05/18/2015  . Hyperlipidemia   . Hypertension    No medications at this time  . Incomplete quadriplegia at C5-6 level ([facility]) 07/24/2008   Qualifier: Diagnosis of  By: Yoo DO, D. Robert Requires bowel regimen including enemas and indwelling foley cath Has had tendency towards pressure sores in past. Had a tear with a transfer in Hospital in past for surgery and developed a sore at that time.   . Low blood pressure reading    Occassionally  . Medicare annual wellness visit, subsequent 11/02/2014   Follows with [facility] Declines colonoscopy  No h/o abnormal pap, declines further paps  . MVP (mitral valve prolapse) 12/01/2012   Mild  . Neurogenic bladder   . OA (osteoarthritis)    Hip and shoulders, neck  . Obesity 11/01/2016  . OSA on CPAP 01/31/2016  . Pain in joint, shoulder region 12/01/2012   Long history B/l Follows with Dr Martin at [facility]  . Postmenopausal bleeding 12/17/2012  . Preventative health care 06/26/2013  . Quadriplegia ([facility]) 1981   Secondary to ruptured AVM C5-C6  . Sacral pressure ulcer 12/01/2012   H/o Follows with Dr Don Jones of Dermatology  . Skin cancer 11/01/2016   2 BCC RLE excised by Dr Dan Jones 2018 1SCC LLE excised by Dr Dan Jones 2018  . Vaginitis and vulvovaginitis 12/17/2012  . Wears glasses     Past Surgical History:  Procedure Laterality Date  . ANAL TAG REMOVAL  2014  . BASAL CELL CARCINOMA EXCISION  2016   Lip  . BASAL CELL CARCINOMA EXCISION  2018   right leg x2  . BASAL CELL CARCINOMA EXCISION Left 2018   leg, chest  . BASAL CELL CARCINOMA EXCISION Right 2019   leg  . BLADDER STONE REMOVAL  1986/87  . COLONOSCOPY  02/22/2016  . COLOSTOMY  02/22/2016  . CYSTOSCOPY WITH   Hyperlipidemia 07/27/2008  . Essential hypertension 07/27/2008  . Incomplete quadriplegia at C5-6 level ([place]) 07/24/2008   Starr Lake PT, DPT, LAT, ATC  04/03/20  2:56 PM      [facility] [address] [place], [place], [address] Phone: [phone]   Fax:  [phone]  Name: [name] MRN: [id number] Date of Birth: July 23, 1950  Hyperlipidemia 07/27/2008  . Essential hypertension 07/27/2008  . Incomplete quadriplegia at C5-6 level ([facility]) 07/24/2008     PT, DPT, LAT, ATC  04/03/20  2:56 PM      [facility] [address] [place], [place], [address] Phone: [phone]   Fax:  [phone]  Name: [name] MRN: [id number] Date of Birth: 08/18/1950     Hyperlipidemia 07/27/2008  . Essential hypertension 07/27/2008  . Incomplete quadriplegia at C5-6 level ([facility]) 07/24/2008     PT, DPT, LAT, ATC  04/03/20  2:56 PM      [facility] [address] [place], [place], [address] Phone: [phone]   Fax:  [phone]  Name: [name] MRN: [id number] Date of Birth: 08/18/1950   

## 2020-04-10 ENCOUNTER — Encounter: Payer: Self-pay | Admitting: Physical Therapy

## 2020-04-10 ENCOUNTER — Other Ambulatory Visit: Payer: Self-pay

## 2020-04-10 ENCOUNTER — Ambulatory Visit: Payer: Medicare PPO | Admitting: Physical Therapy

## 2020-04-10 DIAGNOSIS — M25611 Stiffness of right shoulder, not elsewhere classified: Secondary | ICD-10-CM

## 2020-04-10 DIAGNOSIS — M19011 Primary osteoarthritis, right shoulder: Secondary | ICD-10-CM

## 2020-04-10 DIAGNOSIS — Z96611 Presence of right artificial shoulder joint: Secondary | ICD-10-CM

## 2020-04-10 DIAGNOSIS — R29898 Other symptoms and signs involving the musculoskeletal system: Secondary | ICD-10-CM | POA: Diagnosis not present

## 2020-04-10 NOTE — Therapy (Signed)
[facility] [address] [place], [place], [address] Phone: [phone]   Fax:  [phone]  Physical Therapy Treatment / discharge note  Patient Details  Name: [name] MRN: [id number] Date of Birth: [birth year] Referring Provider (PT): Floria Raveling, MD   Encounter Date: 04/10/2020   PT End of Session - 04/10/20 1401    Visit Number 13    Number of Visits 14    Date for PT Re-Evaluation 04/10/20    Authorization Type [employer] MEDICARE CHOICE PPO; MEDICAID OF [place]    Progress Note Due on Visit 20    PT Start Time 1401    PT Stop Time 1430    PT Time Calculation (min) 29 min    Activity Tolerance Patient tolerated treatment well    Behavior During Therapy [facility] for tasks assessed/performed           Past Medical History:  Diagnosis Date  . Acute bronchitis 05/16/2015  . Anemia    after shoulder replacement  . Autonomic dysreflexia   . AVM (arteriovenous malformation) spine 1981  . Bowel dysfunction    constipation and fecal retention  . Cerumen impaction 10/11/2015  . Complication of anesthesia    low temperature after general anesthesia for colostomy  . Constipation 01/25/2016  . Contusion 05/18/2014   Of foot  . Depression 02/13/2017   History of   . Headache 04/09/2017  . History of kidney stones   . History of neurogenic bowel   . History of rotator cuff tear    Right  . History of ventral hernia   . Hyperglycemia 05/18/2015  . Hyperlipidemia   . Hypertension    No medications at this time  . Incomplete quadriplegia at C5-6 level (HCC) 07/24/2008   Qualifier: Diagnosis of  By: Nena Jordan Requires bowel regimen including enemas and indwelling foley cath Has had tendency towards pressure sores in past. Had a tear with a transfer in Hospital in past for surgery and developed a sore at that time.   . Low blood pressure reading    Occassionally  . Medicare annual wellness visit, subsequent 11/02/2014    Follows with [facility] Declines colonoscopy  No h/o abnormal pap, declines further paps  . MVP (mitral valve prolapse) 12/01/2012   Mild  . Neurogenic bladder   . OA (osteoarthritis)    Hip and shoulders, neck  . Obesity 11/01/2016  . OSA on CPAP 01/31/2016  . Pain in joint, shoulder region 12/01/2012   Long history B/l Follows with Dr Daphine Deutscher at [facility]  . Postmenopausal bleeding 12/17/2012  . Preventative health care 06/26/2013  . Quadriplegia (HCC) 1981   Secondary to ruptured AVM C5-C6  . Sacral pressure ulcer 12/01/2012   H/o Follows with Dr Duane Lope of Dermatology  . Skin cancer 11/01/2016   2 BCC RLE excised by Dr Karlyn Agee 2018 1SCC LLE excised by Dr Karlyn Agee 2018  . Vaginitis and vulvovaginitis 12/17/2012  . Wears glasses     Past Surgical History:  Procedure Laterality Date  . ANAL TAG REMOVAL  2014  . BASAL CELL CARCINOMA EXCISION  2016   Lip  . BASAL CELL CARCINOMA EXCISION  2018   right leg x2  . BASAL CELL CARCINOMA EXCISION Left 2018   leg, chest  . BASAL CELL CARCINOMA EXCISION Right 2019   leg  . BLADDER STONE REMOVAL  1986/87  . COLONOSCOPY  02/22/2016  . COLOSTOMY  02/22/2016  .  CYSTOSCOPY WITH BIOPSY N/A 06/21/2018   Procedure: CYSTOSCOPY WITH BIOPSY FULGURATION;  Surgeon: Bjorn Pippin, MD;  Location: [facility];  Service: Urology;  Laterality: N/A;  . CYSTOSCOPY WITH LITHOLAPAXY N/A 06/21/2018   Procedure: CYSTOSCOPY WITH LITHOLAPAXY;  Surgeon: Bjorn Pippin, MD;  Location: [facility];  Service: Urology;  Laterality: N/A;  . DORSAL CHEILECTOMY-HALLUX RIGIDUS Bilateral 2011  . HERNIA REPAIR  2012   VENTRAL umbilical hernia repair  . LAMINECTOMY  10/23/1979   AVM  . LAPAROSCOPIC SIGMOID COLECTOMY  02/22/2016  . ROTATOR CUFF REPAIR  1996, 1997   left shoulder  . ROTATOR CUFF REPAIR Right 2001  . SHOULDER ARTHROSCOPY  2007   right  . SQUAMOUS CELL CARCINOMA EXCISION Left 2018   leg  . Toe Nails Removal  Bilateral 1981   both great toes  . TOTAL SHOULDER REPLACEMENT  2001   left  . TRANSFER TENDON HAND  1982, 1983   to right hand X3  . VENTRAL HERNIA REPAIR  08/17/2010   Postop ileus (Dr. Andrey Campanile)    There were no vitals filed for this visit.   Subjective Assessment - 04/10/20 1401    Subjective "I am doing pretty good, I am not sore this week."    Patient Stated Goals Pt reports she wants to strengthen her R arm to improve her driving tolerance, to assist better with her bed mobility, and to protect her L TSR which she has had for 21 years.    Currently in Pain? No/denies    Aggravating Factors  N/A              [facility] PT Assessment - 04/10/20 0001      Assessment   Medical Diagnosis Primary osteoarthritis, right shoulder; R-TSA, R    Referring Provider (PT) Floria Raveling, MD      AROM   Right Shoulder Extension 58 Degrees    Right Shoulder Flexion 112 Degrees    Right Shoulder ABduction 102 Degrees    Right Shoulder Internal Rotation 70 Degrees    to stomach   Right Shoulder External Rotation 15 Degrees   in seated with shoulder by the side.     Strength   Right Shoulder Flexion 4/5    Right Shoulder Extension 4/5    Right Shoulder ABduction 4-/5    Right Shoulder Internal Rotation 4/5    Right Shoulder External Rotation 3+/5   at mid range                                PT Education - 04/10/20 1444    Education Details Reviewed HEp and progression of strengthenign with increased reps / sets to promote endurnace and continues strengthening as well as how to progress to the next resistive band.    Person(s) Educated Patient    Methods Verbal cues    Comprehension Verbalized understanding;Verbal cues required            PT Short Term Goals - 02/05/20 1519      PT SHORT TERM GOAL #1   Title Pt will be Ind in a HEP. Achieved    Baseline Started today    Status Achieved    Target Date 02/05/20             PT Long Term Goals -  04/10/20 1410      PT LONG TERM GOAL #1   Title Improved R shoulder  strength to 4/5 or greater. On-going- flex 4-/5; abd 4-/5; ER 3+/5    Period Weeks    Status Partially Met      PT LONG TERM GOAL #2   Period Weeks    Status Achieved      PT LONG TERM GOAL #3   Title Pt will toleratedthe UBE of 10 mins at level 2 to demonstrate improve tolerance of the R shoulder. On-going    Baseline unable to perform due to her power W/C has a bolt on the bottom of her chair limiting her ability to use are bike.    Status Not Met      PT LONG TERM GOAL #4   Title Pt will report improved tolerance of her R arm with driving to improve safety.    Status Achieved      PT LONG TERM GOAL #5   Title Pt will report improved use of her R arm with bed mobility for needed changes in position at night for comfort and pressure relief.    Period Weeks    Status Not Met      PT LONG TERM GOAL #6   Title Pt will be Ind in a final HEP.    Period Weeks    Status Achieved                 Plan - 04/10/20 1446    Clinical Impression Statement Wander has made great progress with physical therpay increasing R shoulder ROM and strength and additionally denies any pain today. She continues to have mild strength deficits with shoulder abduction, as well as ER which is expected following this surgery. reviewed HEp and discussed progression of strengthening. she met or partially met all goals except for LTG #3 due to limitations regarding equipment. She is able to maintain and progress her current funciton independently and will be discharged from PT today.    PT Treatment/Interventions ADLs/Self Care Home Management;Electrical Stimulation;Iontophoresis 4mg /ml Dexamethasone;Moist Heat;Ultrasound;Neuromuscular re-education;Balance training;Therapeutic exercise;Therapeutic activities;Patient/family education;Manual techniques;Taping;Dry needling;Passive range of motion    PT Next Visit Plan D/C today            Patient will benefit from skilled therapeutic intervention in order to improve the following deficits and impairments:  Decreased range of motion, Obesity, Decreased activity tolerance, Decreased strength, Decreased mobility, Decreased balance  Visit Diagnosis: Primary osteoarthritis, right shoulder  Status post reverse total shoulder replacement, right  Decreased ROM of right shoulder     Problem List Patient Active Problem List   Diagnosis Date Noted  . Atopic dermatitis 11/18/2019  . Toe infection 08/12/2019  . Vaginal bleeding 07/18/2019  . Kidney stone 06/25/2018  . Colostomy status (HCC) 02/19/2018  . Right knee pain 04/09/2017  . Right hip pain 04/09/2017  . Headache 04/09/2017  . Depression 02/13/2017  . Obesity 11/01/2016  . Sleep apnea 01/31/2016  . Constipation 01/25/2016  . Hyperglycemia 05/18/2015  . Medicare annual wellness visit, subsequent 11/02/2014  . Preventative health care 06/26/2013  . Postmenopausal bleeding 12/17/2012  . Neurogenic bladder 12/01/2012  . Right shoulder pain 12/01/2012  . Mitral valve disorder 12/01/2012  . Rhinitis 01/03/2012  . CALLUS, RIGHT FOOT 10/30/2009  . ALOPECIA 10/06/2009  . INSOMNIA 08/04/2009  . Anemia 08/28/2008  . Hyperlipidemia 07/27/2008  . Essential hypertension 07/27/2008  . Incomplete quadriplegia at C5-6 level (HCC) 07/24/2008   Lulu Riding PT, DPT, LAT, ATC  04/10/20  2:49 PM      [facility]  [address] [place], [place], [address] Phone: [phone]   Fax:  [phone]  Name: [name] MRN: [id number] Date of Birth: 1950-07-16       PHYSICAL THERAPY DISCHARGE SUMMARY  Visits from Start of Care: 13  Current functional level related to goals / functional outcomes: See goals   Remaining deficits: See assessment   Education / Equipment: HEP, theraband, compensation techniques, anatomy of the area involved.  Plan: Patient  agrees to discharge.  Patient goals were partially met. Patient is being discharged due to meeting the stated rehab goals.  ?????          Henson Fraticelli PT, DPT, LAT, ATC  04/10/20  2:50 PM

## 2020-04-14 DIAGNOSIS — M25519 Pain in unspecified shoulder: Secondary | ICD-10-CM | POA: Diagnosis not present

## 2020-04-14 DIAGNOSIS — G8254 Quadriplegia, C5-C7 incomplete: Secondary | ICD-10-CM | POA: Diagnosis not present

## 2020-04-14 DIAGNOSIS — M1990 Unspecified osteoarthritis, unspecified site: Secondary | ICD-10-CM | POA: Diagnosis not present

## 2020-04-17 ENCOUNTER — Telehealth: Payer: Self-pay | Admitting: Physical Therapy

## 2020-04-17 NOTE — Telephone Encounter (Signed)
Called and LVM with [facility] regarding information regarding [name] and installing DME at her home. Noted to call us back when they are able if there is any pertinent information they may need.   Jaimeson Gopal PT, DPT, LAT, ATC  04/17/20  10:43 AM

## 2020-04-20 NOTE — Telephone Encounter (Signed)
Returned Joy Heard's call from [facility] regarding information about getting a in bedroom/ bathrom ceiling mounted lift. Alexandra Felipa Evener inquired if a in room ceiling motor lift would be helpful for [name]. I discussed that to my knowledge Alexandra [place] currently has a hoyer lift at home which is the primary way she transfers in and out of bed which requires the use of a harness to strap into the lift which she reports without assistance she would not be able to get her self into the harness. For an in home motor ceiling mount lift I would image a harness would be needed to secure Alexandra Dodge to transfer and she would have limited ability to secure herself in the harness without the assistance of a caretaker.   Mauricio Dahlen PT, DPT, LAT, ATC  04/20/20  4:18 PM

## 2020-05-06 DIAGNOSIS — Z933 Colostomy status: Secondary | ICD-10-CM | POA: Diagnosis not present

## 2020-05-06 DIAGNOSIS — G8250 Quadriplegia, unspecified: Secondary | ICD-10-CM | POA: Diagnosis not present

## 2020-05-06 DIAGNOSIS — R32 Unspecified urinary incontinence: Secondary | ICD-10-CM | POA: Diagnosis not present

## 2020-05-12 DIAGNOSIS — G4733 Obstructive sleep apnea (adult) (pediatric): Secondary | ICD-10-CM | POA: Diagnosis not present

## 2020-05-25 ENCOUNTER — Ambulatory Visit: Payer: Medicare PPO | Admitting: Family Medicine

## 2020-05-27 ENCOUNTER — Telehealth: Payer: Self-pay

## 2020-05-28 NOTE — Telephone Encounter (Signed)
Form completed, awaiting MD signature. (In PCP yellow folder).

## 2020-06-01 ENCOUNTER — Telehealth: Payer: Self-pay | Admitting: *Deleted

## 2020-06-01 NOTE — Telephone Encounter (Signed)
Error

## 2020-06-01 NOTE — Telephone Encounter (Signed)
Form done and placed in mail.  Patient notified that has been done.

## 2020-06-05 DIAGNOSIS — Z933 Colostomy status: Secondary | ICD-10-CM | POA: Diagnosis not present

## 2020-06-05 DIAGNOSIS — G8250 Quadriplegia, unspecified: Secondary | ICD-10-CM | POA: Diagnosis not present

## 2020-06-05 DIAGNOSIS — R32 Unspecified urinary incontinence: Secondary | ICD-10-CM | POA: Diagnosis not present

## 2020-06-08 DIAGNOSIS — R32 Unspecified urinary incontinence: Secondary | ICD-10-CM | POA: Diagnosis not present

## 2020-06-08 DIAGNOSIS — G8250 Quadriplegia, unspecified: Secondary | ICD-10-CM | POA: Diagnosis not present

## 2020-06-08 DIAGNOSIS — Z933 Colostomy status: Secondary | ICD-10-CM | POA: Diagnosis not present

## 2020-06-09 DIAGNOSIS — G8250 Quadriplegia, unspecified: Secondary | ICD-10-CM | POA: Diagnosis not present

## 2020-06-09 DIAGNOSIS — R32 Unspecified urinary incontinence: Secondary | ICD-10-CM | POA: Diagnosis not present

## 2020-06-09 DIAGNOSIS — Z933 Colostomy status: Secondary | ICD-10-CM | POA: Diagnosis not present

## 2020-06-11 ENCOUNTER — Other Ambulatory Visit: Payer: Self-pay | Admitting: Family Medicine

## 2020-06-11 DIAGNOSIS — G4733 Obstructive sleep apnea (adult) (pediatric): Secondary | ICD-10-CM | POA: Diagnosis not present

## 2020-06-15 DIAGNOSIS — D2261 Melanocytic nevi of right upper limb, including shoulder: Secondary | ICD-10-CM | POA: Diagnosis not present

## 2020-06-15 DIAGNOSIS — L570 Actinic keratosis: Secondary | ICD-10-CM | POA: Diagnosis not present

## 2020-06-15 DIAGNOSIS — L661 Lichen planopilaris: Secondary | ICD-10-CM | POA: Diagnosis not present

## 2020-06-15 DIAGNOSIS — L820 Inflamed seborrheic keratosis: Secondary | ICD-10-CM | POA: Diagnosis not present

## 2020-06-15 DIAGNOSIS — L821 Other seborrheic keratosis: Secondary | ICD-10-CM | POA: Diagnosis not present

## 2020-06-15 DIAGNOSIS — Z85828 Personal history of other malignant neoplasm of skin: Secondary | ICD-10-CM | POA: Diagnosis not present

## 2020-06-15 DIAGNOSIS — L718 Other rosacea: Secondary | ICD-10-CM | POA: Diagnosis not present

## 2020-06-15 DIAGNOSIS — D2262 Melanocytic nevi of left upper limb, including shoulder: Secondary | ICD-10-CM | POA: Diagnosis not present

## 2020-06-15 DIAGNOSIS — D485 Neoplasm of uncertain behavior of skin: Secondary | ICD-10-CM | POA: Diagnosis not present

## 2020-06-25 ENCOUNTER — Other Ambulatory Visit: Payer: Self-pay | Admitting: Family Medicine

## 2020-06-30 ENCOUNTER — Telehealth: Payer: Self-pay | Admitting: Family Medicine

## 2020-06-30 DIAGNOSIS — R3911 Hesitancy of micturition: Secondary | ICD-10-CM

## 2020-06-30 NOTE — Telephone Encounter (Signed)
Patient states she is having problems with catheter and would like a urine culture. Patient decline appt

## 2020-06-30 NOTE — Telephone Encounter (Signed)
Please set her up with a urinalysis and culture for urinary hesitancy

## 2020-06-30 NOTE — Telephone Encounter (Signed)
Please advise 

## 2020-07-01 ENCOUNTER — Other Ambulatory Visit: Payer: Self-pay | Admitting: Family Medicine

## 2020-07-01 ENCOUNTER — Other Ambulatory Visit (INDEPENDENT_AMBULATORY_CARE_PROVIDER_SITE_OTHER): Payer: Medicare PPO

## 2020-07-01 ENCOUNTER — Other Ambulatory Visit: Payer: Self-pay

## 2020-07-01 DIAGNOSIS — R3911 Hesitancy of micturition: Secondary | ICD-10-CM

## 2020-07-01 LAB — URINALYSIS, ROUTINE W REFLEX MICROSCOPIC
Bilirubin Urine: NEGATIVE
Ketones, ur: NEGATIVE
Nitrite: POSITIVE — AB
Specific Gravity, Urine: 1.01 (ref 1.000–1.030)
Total Protein, Urine: NEGATIVE
Urine Glucose: NEGATIVE
Urobilinogen, UA: 0.2 (ref 0.0–1.0)
pH: 8 (ref 5.0–8.0)

## 2020-07-01 MED ORDER — AMOXICILLIN 500 MG PO CAPS: 500.0000 mg | ORAL_CAPSULE | Freq: Three times a day (TID) | ORAL | 0 refills | Status: DC

## 2020-07-01 NOTE — Telephone Encounter (Signed)
Spoke with patient and she stated that she will try to get urine here by 3pm.

## 2020-07-02 LAB — URINE CULTURE
MICRO NUMBER:: 11433441
SPECIMEN QUALITY:: ADEQUATE

## 2020-07-06 ENCOUNTER — Other Ambulatory Visit: Payer: Medicare PPO

## 2020-07-27 ENCOUNTER — Telehealth: Payer: Self-pay | Admitting: Family Medicine

## 2020-07-27 NOTE — Telephone Encounter (Signed)
Patient would like to know who is refilling her colostomy supplies.  (supplier is [employer])

## 2020-07-28 ENCOUNTER — Other Ambulatory Visit: Payer: Self-pay | Admitting: Family Medicine

## 2020-07-28 NOTE — Telephone Encounter (Signed)
Spoke with patient and she stated that [employer] sent a request for urological supplies to her GI surgeon, who then called her to tell her that she is not of patient of theirs anymore.  She then said to herself well who has been ordering her ostomy supplies?  I asked have she been following up with a GI doctor and she stated "no she has not followed up since her surgery".  She wants to know if she has any issues with her ostomy supplies will you refill them?  Or should she be following up with GI?

## 2020-07-28 NOTE — Telephone Encounter (Signed)
So yes and yes. I can help to order her ostomy supplies but if she needs her tube changed, adjusted etc. She would need to see GI again so makes sense to stay involved with them. Sometimes interventional radiology can help with the tubes also if we need them.

## 2020-07-28 NOTE — Telephone Encounter (Signed)
Patient notified

## 2020-08-03 ENCOUNTER — Telehealth: Payer: Self-pay

## 2020-08-03 NOTE — Telephone Encounter (Signed)
Paperwork faxed to [phone]

## 2020-08-05 ENCOUNTER — Other Ambulatory Visit: Payer: Self-pay | Admitting: Family Medicine

## 2020-08-11 ENCOUNTER — Other Ambulatory Visit: Payer: Self-pay | Admitting: Family Medicine

## 2020-08-14 ENCOUNTER — Other Ambulatory Visit: Payer: Self-pay | Admitting: Family Medicine

## 2020-08-14 NOTE — Telephone Encounter (Signed)
Patient checking the status of medication

## 2020-08-26 DIAGNOSIS — N310 Uninhibited neuropathic bladder, not elsewhere classified: Secondary | ICD-10-CM | POA: Diagnosis not present

## 2020-08-26 DIAGNOSIS — N3020 Other chronic cystitis without hematuria: Secondary | ICD-10-CM | POA: Diagnosis not present

## 2020-08-26 DIAGNOSIS — N130 Hydronephrosis with ureteropelvic junction obstruction: Secondary | ICD-10-CM | POA: Diagnosis not present

## 2020-08-27 ENCOUNTER — Other Ambulatory Visit: Payer: Self-pay | Admitting: Family Medicine

## 2020-08-29 ENCOUNTER — Other Ambulatory Visit: Payer: Self-pay | Admitting: Family Medicine

## 2020-08-31 ENCOUNTER — Other Ambulatory Visit: Payer: Self-pay | Admitting: Family Medicine

## 2020-09-01 ENCOUNTER — Ambulatory Visit (INDEPENDENT_AMBULATORY_CARE_PROVIDER_SITE_OTHER): Payer: Medicare PPO | Admitting: Medical

## 2020-09-01 ENCOUNTER — Other Ambulatory Visit: Payer: Self-pay

## 2020-09-01 VITALS — BP 140/80 | HR 51 | Resp 18 | Ht 71.0 in | Wt 220.0 lb

## 2020-09-01 DIAGNOSIS — R739 Hyperglycemia, unspecified: Secondary | ICD-10-CM

## 2020-09-01 DIAGNOSIS — E785 Hyperlipidemia, unspecified: Secondary | ICD-10-CM | POA: Diagnosis not present

## 2020-09-01 DIAGNOSIS — I10 Essential (primary) hypertension: Secondary | ICD-10-CM

## 2020-09-01 DIAGNOSIS — Z87440 Personal history of urinary (tract) infections: Secondary | ICD-10-CM | POA: Diagnosis not present

## 2020-09-01 MED ORDER — HYDROCHLOROTHIAZIDE 12.5 MG PO CAPS: 12.5000 mg | ORAL_CAPSULE | Freq: Every day | ORAL | 3 refills | Status: DC

## 2020-09-01 NOTE — Patient Instructions (Addendum)
Hx of htn. Your blood pressure is moderate well controlled today. I want you to check bp daily. If above current level would consider increasing your lisinopril to 10 mg daily. Continue hctz 12.5 mg daily. You have refills of meds presently.  Hx of elevated sugar in past. Well controlled in recent past. Will recheck cmp and a1c today.  For elevated cholesterol will get lipid panel. Continue crestor 5 mg daily.   For recent uti continue bactrim.  Folllow up date to be determined after lab review.

## 2020-09-01 NOTE — Progress Notes (Signed)
Subjective:    Patient ID: [name], female    DOB: July 25, 1950, 70 y.o.   MRN: [id number]  HPI  Pt in for follow up.  Pt tells me just recently started bactrim after seeing her urologsit for yearly exam. Recent more frequent catheter changes and culture came back +. She was given bactrim DS.  Pt has hx of htn. Pt indicates she has some htn with white coat. Pt states driving here was very stressful. Pt does have a bp machine at home but does not routinely check it. No gross motor or sensory function deficits. Pt is on lisinopril 5 mg daily and hctz 12.5 mg daily.  Hx of diabetes. Pt a1c was 6.4 back in september. Pt was prescribed metformin but never took the med. 9 months ago a1c was 7.3.  Mild elated cholesterol in the past. Pt is on crestor 5 mg daily. Refill of crestor sent in yesterday.    Review of Systems  Constitutional: Negative for chills, fatigue and fever.  HENT: Negative for congestion.   Respiratory: Negative for cough, chest tightness, shortness of breath and wheezing.   Cardiovascular: Negative for chest pain and palpitations.  Gastrointestinal: Negative for abdominal pain.  Genitourinary: Negative for dysuria and flank pain.  Musculoskeletal: Negative for back pain, gait problem, joint swelling and myalgias.  Neurological: Negative for dizziness, seizures, weakness and headaches.  Hematological: Negative for adenopathy. Does not bruise/bleed easily.  Psychiatric/Behavioral: Negative for behavioral problems and confusion.    Past Medical History:  Diagnosis Date  . Acute bronchitis 05/16/2015  . Anemia    after shoulder replacement  . Autonomic dysreflexia   . AVM (arteriovenous malformation) spine 1981  . Bowel dysfunction    constipation and fecal retention  . Cerumen impaction 10/11/2015  . Complication of anesthesia    low temperature after general anesthesia for colostomy  . Constipation 01/25/2016  . Contusion 05/18/2014   Of foot  . Depression  02/13/2017   History of   . Headache 04/09/2017  . History of kidney stones   . History of neurogenic bowel   . History of rotator cuff tear    Right  . History of ventral hernia   . Hyperglycemia 05/18/2015  . Hyperlipidemia   . Hypertension    No medications at this time  . Incomplete quadriplegia at C5-6 level ([place]) 07/24/2008   Qualifier: Diagnosis of  By: Wynona Luna Requires bowel regimen including enemas and indwelling foley cath Has had tendency towards pressure sores in past. Had a tear with a transfer in Hospital in past for surgery and developed a sore at that time.   . Low blood pressure reading    Occassionally  . Medicare annual wellness visit, subsequent 11/02/2014   Follows with [facility] Declines colonoscopy  No h/o abnormal pap, declines further paps  . MVP (mitral valve prolapse) 12/01/2012   Mild  . Neurogenic bladder   . OA (osteoarthritis)    Hip and shoulders, neck  . Obesity 11/01/2016  . OSA on CPAP 01/31/2016  . Pain in joint, shoulder region 12/01/2012   Long history B/l Follow[facility] Hassell [facility]  . Postmenopausal bleeding 12/17/2012  . Preventative health care 06/26/2013  . Quadriplegia ([place]) 1981   Secondary to ruptured AVM C5-C6  . Sacral pressure ulcer 12/01/2012   H/o Follows with Dr Tomi Likens of Dermatology  . Skin cancer 11/01/2016   2 BCC RLE excised by Dr Wilhemina Bonito  2018 [place] LLE excised by Dr Wilhemina Bonito 2018  . Vaginitis and vulvovaginitis 12/17/2012  . Wears glasses      Social History   Socioeconomic History  . Marital status: Single    Spouse name: Not on file  . Number of children: Not on file  . Years of education: Not on file  . Highest education level: Not on file  Occupational History  . Occupation: Disabled    Comment: Music therapist  Tobacco Use  . Smoking status: Never Smoker  . Smokeless tobacco: Never Used  Vaping Use  . Vaping Use: Never used  Substance and Sexual Activity  . Alcohol use: No   . Drug use: No  . Sexual activity: Not on file    Comment: lives with sister and mother, no major dietary restrictions  Other Topics Concern  . Not on file  Social History Narrative   Sister is primary caregiver   Social Determinants of Health   Financial Resource Strain: Not on file  Food Insecurity: Not on file  Transportation Needs: Not on file  Physical Activity: Not on file  Stress: Not on file  Social Connections: Not on file  Intimate Partner Violence: Not on file    Past Surgical History:  Procedure Laterality Date  . ANAL TAG REMOVAL  2014  . BASAL CELL CARCINOMA EXCISION  2016   Lip  . BASAL CELL CARCINOMA EXCISION  2018   right leg x2  . BASAL CELL CARCINOMA EXCISION Left 2018   leg, chest  . BASAL CELL CARCINOMA EXCISION Right 2019   leg  . BLADDER STONE REMOVAL  1986/87  . COLONOSCOPY  02/22/2016  . COLOSTOMY  02/22/2016  . CYSTOSCOPY WITH BIOPSY N/A 06/21/2018   Procedure: CYSTOSCOPY WITH BIOPSY FULGURATION;  Surgeon: Irine Seal, MD;  Location: [facility];  Service: Urology;  Laterality: N/A;  . CYSTOSCOPY WITH LITHOLAPAXY N/A 06/21/2018   Procedure: CYSTOSCOPY WITH LITHOLAPAXY;  Surgeon: Irine Seal, MD;  Location: [facility];  Service: Urology;  Laterality: N/A;  . DORSAL CHEILECTOMY-HALLUX RIGIDUS Bilateral 2011  . HERNIA REPAIR  9417   VENTRAL umbilical hernia repair  . LAMINECTOMY  10/23/1979   AVM  . LAPAROSCOPIC SIGMOID COLECTOMY  02/22/2016  . [place]   left shoulder  . ROTATOR CUFF REPAIR Right 2001  . SHOULDER ARTHROSCOPY  2007   right  . SQUAMOUS CELL CARCINOMA EXCISION Left 2018   leg  . Toe Nails Removal Bilateral 1981   both great toes  . TOTAL SHOULDER REPLACEMENT  2001   left  . TRANSFER TENDON HAND  1982, 1983   to right hand X3  . VENTRAL HERNIA REPAIR  08/17/2010   Postop ileus (Dr. Redmond Pulling)    Family History  Problem Relation Age of Onset  . Cancer Mother         breast & melanoma  . Hypertension Mother   . Heart disease Father        CAD, MI, AFib  . Cancer Sister        melanoma  . Cancer Maternal Grandfather        melanoma  . Stroke Paternal Grandmother   . Heart disease Paternal Grandfather   . Cancer Maternal Aunt        breast  . Cancer Paternal Aunt        breast  . Cancer Paternal Uncle        breast  . Cancer Paternal  Aunt        breast    Allergies  Allergen Reactions  . Adhesive [Tape] Other (See Comments)    Skin irritation Does not tolerate [employer], tolerates [place]    Current Outpatient Medications on File Prior to Visit  Medication Sig Dispense Refill  . baclofen (LIORESAL) 20 MG tablet Take 0.5-1 tablets (10-20 mg total) by mouth 4 (four) times daily as needed for muscle spasms. 90 each 1  . clobetasol (TEMOVATE) 0.05 % external solution Apply 1 application topically every Monday, Wednesday, and Friday. Use on scalp 3 times per week    . docusate sodium (COLACE) 100 MG capsule Take 1 tablet by mouth daily.    Marland Kitchen ELDERBERRY PO Take 2 each by mouth daily.    Marland Kitchen FIBER PO Take 1 capsule by mouth daily.    . hydrochlorothiazide (MICROZIDE) 12.5 MG capsule Take 1 capsule (12.5 mg total) by mouth daily. 90 capsule 0  . lisinopril (ZESTRIL) 5 MG tablet TAKE 1 TABLET BY MOUTH DAILY 90 tablet 1  . MELATONIN GUMMIES PO Take 5 mg by mouth at bedtime.    . Multiple Vitamins-Calcium (ONE-A-DAY WOMENS FORMULA PO) Take 1 tablet by mouth daily.    Marland Kitchen oxybutynin (DITROPAN-XL) 10 MG 24 hr tablet Take 10 mg by mouth at bedtime.    . Probiotic Product (DIGESTIVE ADVANTAGE) CAPS Take 1 tablet by mouth daily.    . rosuvastatin (CRESTOR) 5 MG tablet Take 1 tablet (5 mg total) by mouth daily. 90 tablet 1  . amoxicillin (AMOXIL) 500 MG capsule Take 1 capsule (500 mg total) by mouth 3 (three) times daily. 21 capsule 0  . metFORMIN (GLUCOPHAGE) 500 MG tablet Take 1 tablet (500 mg total) by mouth daily with breakfast. (Patient not taking:  Reported on 01/01/2020) 30 tablet 3  . mupirocin ointment (BACTROBAN) 2 % Place 1 application into the nose 2 (two) times daily. (Patient not taking: No sig reported) 22 g 1  . oxybutynin (DITROPAN) 5 MG tablet TAKE 1 TABLET BY MOUTH DAILY IN THE MORNING (Patient not taking: Reported on 09/01/2020) 90 tablet 1  . triamcinolone cream (KENALOG) 0.1 % Apply 1 application topically 2 (two) times daily. (Patient not taking: Reported on 01/01/2020) 80 g 2   No current facility-administered medications on file prior to visit.    BP (!) 171/64   Pulse (!) 51   Resp 18   Ht 5\' 11"  (1.803 m)   Wt 220 lb (99.8 kg)   SpO2 100%   BMI 30.68 kg/m       Objective:   Physical Exam  General- No acute distress. Pleasant patient. Neck- Full range of motion, no jvd Lungs- Clear, even and unlabored. Heart- regular rate and rhythm. Neurologic- CNII- XII grossly intact. Back- no cva tenderness.  Abdomen- soft nt, nd, +bs, no rebound or guarding. No suprapubic tenderness.      Assessment & Plan:  Hx of htn. Your blood pressure is moderate well controlled today. I want you to check bp daily. If above current level would consider increasing your lisinopril to 10 mg daily. Continue hctz 12.5 mg daily. You have refills of meds presently.  Hx of elevated sugar in past. Well controlled in recent past. Will recheck cmp and a1c today.  For elevated cholesterol will get lipid panel. Continue crestor 5 mg daily.   For recent uti continue bactrim.  Folllow up date to be determined after lab review.  Mackie Pai, PA-C

## 2020-09-02 LAB — COMPREHENSIVE METABOLIC PANEL
ALT: 18 U/L (ref 0–35)
AST: 17 U/L (ref 0–37)
Albumin: 4.2 g/dL (ref 3.5–5.2)
Alkaline Phosphatase: 97 U/L (ref 39–117)
BUN: 8 mg/dL (ref 6–23)
CO2: 29 mEq/L (ref 19–32)
Calcium: 9.9 mg/dL (ref 8.4–10.5)
Chloride: 99 mEq/L (ref 96–112)
Creatinine, Ser: 0.46 mg/dL (ref 0.40–1.20)
GFR: 98.73 mL/min (ref >60.00)
Glucose, Bld: 111 mg/dL — ABNORMAL HIGH (ref 70–99)
Potassium: 4.4 mEq/L (ref 3.5–5.1)
Sodium: 138 mEq/L (ref 135–145)
Total Bilirubin: 0.6 mg/dL (ref 0.2–1.2)
Total Protein: 7.1 g/dL (ref 6.0–8.3)

## 2020-09-02 LAB — LIPID PANEL
Cholesterol: 157 mg/dL (ref 0–200)
HDL: 41.4 mg/dL (ref >39.00)
LDL Cholesterol: 83 mg/dL (ref 0–99)
NonHDL: 115.62
Total CHOL/HDL Ratio: 4
Triglycerides: 163 mg/dL — ABNORMAL HIGH (ref 0.0–149.0)
VLDL: 32.6 mg/dL (ref 0.0–40.0)

## 2020-09-02 LAB — HEMOGLOBIN A1C: Hgb A1c MFr Bld: 6.3 % (ref 4.6–6.5)

## 2020-09-04 DIAGNOSIS — R32 Unspecified urinary incontinence: Secondary | ICD-10-CM | POA: Diagnosis not present

## 2020-09-04 DIAGNOSIS — Z933 Colostomy status: Secondary | ICD-10-CM | POA: Diagnosis not present

## 2020-09-04 DIAGNOSIS — G8250 Quadriplegia, unspecified: Secondary | ICD-10-CM | POA: Diagnosis not present

## 2020-09-07 DIAGNOSIS — G8250 Quadriplegia, unspecified: Secondary | ICD-10-CM | POA: Diagnosis not present

## 2020-09-07 DIAGNOSIS — R32 Unspecified urinary incontinence: Secondary | ICD-10-CM | POA: Diagnosis not present

## 2020-09-07 DIAGNOSIS — Z933 Colostomy status: Secondary | ICD-10-CM | POA: Diagnosis not present

## 2020-09-09 DIAGNOSIS — G4733 Obstructive sleep apnea (adult) (pediatric): Secondary | ICD-10-CM | POA: Diagnosis not present

## 2020-10-09 DIAGNOSIS — G8250 Quadriplegia, unspecified: Secondary | ICD-10-CM | POA: Diagnosis not present

## 2020-10-09 DIAGNOSIS — Z933 Colostomy status: Secondary | ICD-10-CM | POA: Diagnosis not present

## 2020-10-09 DIAGNOSIS — R32 Unspecified urinary incontinence: Secondary | ICD-10-CM | POA: Diagnosis not present

## 2020-10-26 DIAGNOSIS — D225 Melanocytic nevi of trunk: Secondary | ICD-10-CM | POA: Diagnosis not present

## 2020-10-26 DIAGNOSIS — L820 Inflamed seborrheic keratosis: Secondary | ICD-10-CM | POA: Diagnosis not present

## 2020-10-26 DIAGNOSIS — L570 Actinic keratosis: Secondary | ICD-10-CM | POA: Diagnosis not present

## 2020-10-26 DIAGNOSIS — L821 Other seborrheic keratosis: Secondary | ICD-10-CM | POA: Diagnosis not present

## 2020-10-26 DIAGNOSIS — Z85828 Personal history of other malignant neoplasm of skin: Secondary | ICD-10-CM | POA: Diagnosis not present

## 2020-10-26 DIAGNOSIS — L720 Epidermal cyst: Secondary | ICD-10-CM | POA: Diagnosis not present

## 2020-10-26 DIAGNOSIS — C44612 Basal cell carcinoma of skin of right upper limb, including shoulder: Secondary | ICD-10-CM | POA: Diagnosis not present

## 2020-10-26 DIAGNOSIS — D485 Neoplasm of uncertain behavior of skin: Secondary | ICD-10-CM | POA: Diagnosis not present

## 2020-10-26 DIAGNOSIS — L814 Other melanin hyperpigmentation: Secondary | ICD-10-CM | POA: Diagnosis not present

## 2020-10-26 DIAGNOSIS — L718 Other rosacea: Secondary | ICD-10-CM | POA: Diagnosis not present

## 2020-11-10 ENCOUNTER — Other Ambulatory Visit: Payer: Self-pay | Admitting: Family Medicine

## 2020-11-10 DIAGNOSIS — Z933 Colostomy status: Secondary | ICD-10-CM | POA: Diagnosis not present

## 2020-11-10 DIAGNOSIS — G8250 Quadriplegia, unspecified: Secondary | ICD-10-CM | POA: Diagnosis not present

## 2020-11-10 DIAGNOSIS — R32 Unspecified urinary incontinence: Secondary | ICD-10-CM | POA: Diagnosis not present

## 2020-11-24 DIAGNOSIS — Z933 Colostomy status: Secondary | ICD-10-CM | POA: Diagnosis not present

## 2020-11-24 DIAGNOSIS — R32 Unspecified urinary incontinence: Secondary | ICD-10-CM | POA: Diagnosis not present

## 2020-11-24 DIAGNOSIS — G8250 Quadriplegia, unspecified: Secondary | ICD-10-CM | POA: Diagnosis not present

## 2020-12-08 DIAGNOSIS — G4733 Obstructive sleep apnea (adult) (pediatric): Secondary | ICD-10-CM | POA: Diagnosis not present

## 2020-12-12 DIAGNOSIS — G8250 Quadriplegia, unspecified: Secondary | ICD-10-CM | POA: Diagnosis not present

## 2020-12-12 DIAGNOSIS — Z933 Colostomy status: Secondary | ICD-10-CM | POA: Diagnosis not present

## 2020-12-12 DIAGNOSIS — R32 Unspecified urinary incontinence: Secondary | ICD-10-CM | POA: Diagnosis not present

## 2021-01-18 DIAGNOSIS — R928 Other abnormal and inconclusive findings on diagnostic imaging of breast: Secondary | ICD-10-CM | POA: Diagnosis not present

## 2021-01-18 DIAGNOSIS — Z1231 Encounter for screening mammogram for malignant neoplasm of breast: Secondary | ICD-10-CM | POA: Diagnosis not present

## 2021-01-18 DIAGNOSIS — N6002 Solitary cyst of left breast: Secondary | ICD-10-CM | POA: Diagnosis not present

## 2021-01-18 LAB — HM MAMMOGRAPHY

## 2021-01-19 ENCOUNTER — Encounter: Payer: Self-pay | Admitting: *Deleted

## 2021-01-24 NOTE — Progress Notes (Signed)
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 Machine is now old. She has limited use of her hands, but is able to manage mask and headgear.  Prior to Admission medications   Medication Sig Start Date End Date Taking? Authorizing Provider  baclofen (LIORESAL) 20 MG tablet TAKE 1/2 TO 1 TABLET BY MOUTH FOUR TIMESDAILY AS NEEDED FOR MUSCLE SPASMS 11/10/20  Yes Mosie Lukes, MD  clobetasol (TEMOVATE) 0.05 % external solution Apply 1 application topically every Monday, Wednesday, and Friday. Use on scalp 3 times per week   Yes [provider]  docusate sodium (COLACE) 100 MG capsule Take 1 tablet by mouth daily. 02/22/16  Yes [provider]  ELDERBERRY PO Take 2 each by mouth daily.   Yes [provider]  FIBER PO Take 1 capsule by mouth daily.   Yes [provider]  hydrochlorothiazide (MICROZIDE) 12.5 MG capsule Take 1 capsule (12.5 mg total) by mouth daily. 09/01/20  Yes Saguier, Percell Miller, PA-C  influenza vaccine adjuvanted (FLUAD) 0.5 ML injection INJECT AS DIRECTED 03/31/20 03/31/21 Yes Carlyle Basques, MD  lisinopril (ZESTRIL) 5 MG tablet TAKE 1 TABLET BY MOUTH DAILY 08/05/20  Yes Mosie Lukes, MD  MELATONIN GUMMIES PO Take 5 mg by mouth at bedtime.   Yes [provider]  Multiple Vitamins-Calcium (ONE-A-DAY WOMENS FORMULA PO) Take 1 tablet by mouth daily.   Yes [provider]  oxybutynin (DITROPAN-XL) 10 MG 24 hr tablet Take 10 mg by mouth at bedtime.   Yes [provider]  Probiotic Product  (DIGESTIVE ADVANTAGE) CAPS Take 1 tablet by mouth daily. 06/13/14  Yes [provider]  rosuvastatin (CRESTOR) 5 MG tablet Take 1 tablet (5 mg total) by mouth daily. 08/31/20  Yes Mosie Lukes, MD  amoxicillin (AMOXIL) 500 MG capsule Take 1 capsule (500 mg total) by mouth 3 (three) times daily. 07/01/20   Mosie Lukes, MD  metFORMIN (GLUCOPHAGE) 500 MG tablet Take 1 tablet (500 mg total) by mouth daily with breakfast. Patient not taking: Reported on 01/01/2020 11/19/19   Mosie Lukes, MD  mupirocin ointment (BACTROBAN) 2 % Place 1 application into the nose 2 (two) times daily. Patient not taking: No sig reported 06/18/19   Mosie Lukes, MD  oxybutynin (DITROPAN) 5 MG tablet TAKE 1 TABLET BY MOUTH DAILY IN THE MORNING Patient not taking: Reported on 09/01/2020 02/28/20   Mosie Lukes, MD  triamcinolone cream (KENALOG) 0.1 % Apply 1 application topically 2 (two) times daily. Patient not taking: Reported on 01/01/2020 11/18/19   Mosie Lukes, MD   Past Medical History:  Diagnosis Date   Acute bronchitis 05/16/2015   Anemia    after shoulder replacement   Autonomic dysreflexia    AVM (arteriovenous malformation) spine 1981   Bowel dysfunction    constipation and fecal retention   Cerumen impaction [id number]   Complication of anesthesia    low temperature after general anesthesia for colostomy   Constipation 01/25/2016   Contusion 05/18/2014   Of foot   Depression 02/13/2017  History of    Headache 04/09/2017   History of kidney stones    History of neurogenic bowel    History of rotator cuff tear    Right   History of ventral hernia    Hyperglycemia 05/18/2015   Hyperlipidemia    Hypertension    No medications at this time   Incomplete quadriplegia at C5-6 level ([place]) 07/24/2008   Qualifier: Diagnosis of  By: Wynona Luna Requires bowel regimen including enemas and indwelling foley cath Has had tendency towards pressure sores in past. Had a tear with a transfer in  [facility] in past for surgery and developed a sore at that time.    Low blood pressure reading    Occassionally   Medicare annual wellness visit, subsequent 11/02/2014   Follows with [facility] Declines colonoscopy  No h/o abnormal pap, declines further paps   MVP (mitral valve prolapse) 12/01/2012   Mild   Neurogenic bladder    OA (osteoarthritis)    Hip and shoulders, neck   Obesity 11/01/2016   OSA on CPAP 01/31/2016   Pain in joint, shoulder region 12/01/2012   Long history B/l Follows [facility] Dr Hassell [facility]   Postmenopausal bleeding 12/17/2012   Preventative health care 06/26/2013   Quadriplegia ([place]) 1981   Secondary to ruptured AVM C5-C6   Sacral pressure ulcer 12/01/2012   H/o Follows with Dr Tomi Likens of Dermatology   Skin cancer 11/01/2016   2 BCC RLE excised by Dr Wilhemina Bonito 2018 [place] LLE excised by Dr Wilhemina Bonito 2018   Vaginitis and vulvovaginitis 12/17/2012   Wears glasses    Past Surgical History:  Procedure Laterality Date   ANAL TAG REMOVAL  2014   BASAL CELL CARCINOMA EXCISION  2016   Lip   BASAL CELL CARCINOMA EXCISION  2018   right leg x2   BASAL CELL CARCINOMA EXCISION Left 2018   leg, chest   BASAL CELL CARCINOMA EXCISION Right 2019   leg   BLADDER STONE REMOVAL  1986/87   COLONOSCOPY  02/22/2016   COLOSTOMY  02/22/2016   CYSTOSCOPY WITH BIOPSY N/A 06/21/2018   Procedure: CYSTOSCOPY WITH BIOPSY FULGURATION;  Surgeon: Irine Seal, MD;  Location: [facility];  Service: Urology;  Laterality: N/A;   CYSTOSCOPY WITH LITHOLAPAXY N/A 06/21/2018   Procedure: CYSTOSCOPY WITH LITHOLAPAXY;  Surgeon: Irine Seal, MD;  Location: [facility];  Service: Urology;  Laterality: N/A;   DORSAL CHEILECTOMY-HALLUX RIGIDUS Bilateral 2011   HERNIA REPAIR  0000000   VENTRAL umbilical hernia repair   LAMINECTOMY  10/23/1979   AVM   LAPAROSCOPIC SIGMOID COLECTOMY  02/22/2016   ROTATOR CUFF REPAIR  1996, 1997   left shoulder   ROTATOR CUFF  REPAIR Right 2001   SHOULDER ARTHROSCOPY  2007   right   SQUAMOUS CELL CARCINOMA EXCISION Left 2018   leg   Toe Nails Removal Bilateral 1981   both great toes   TOTAL SHOULDER REPLACEMENT  2001   left   TRANSFER TENDON HAND  1982, 1983   to right hand X3   VENTRAL HERNIA REPAIR  08/17/2010   Postop ileus (Dr. Redmond Pulling)   Family History  Problem Relation Age of Onset   Cancer Mother        breast & melanoma   Hypertension Mother    Heart disease Father        CAD, MI, AFib   Cancer Sister  melanoma   Cancer Maternal Grandfather        melanoma   Stroke Paternal Grandmother    Heart disease Paternal Grandfather    Cancer Maternal Aunt        breast   Cancer Paternal Aunt        breast   Cancer Paternal Uncle        breast   Cancer Paternal Aunt        breast   Social History   Socioeconomic History   Marital status: Single    Spouse name: Not on file   Number of children: Not on file   Years of education: Not on file   Highest education level: Not on file  Occupational History   Occupation: Disabled    Comment: Music therapist  Tobacco Use   Smoking status: Never   Smokeless tobacco: Never  Vaping Use   Vaping Use: Never used  Substance and Sexual Activity   Alcohol use: No   Drug use: No   Sexual activity: Not on file    Comment: lives with sister and mother, no major dietary restrictions  Other Topics Concern   Not on file  Social History Narrative   Sister is primary caregiver   Social Determinants of Health   Financial Resource Strain: Not on file  Food Insecurity: Not on file  Transportation Needs: Not on file  Physical Activity: Not on file  Stress: Not on file  Social Connections: Not on file  Intimate Partner Violence: Not on file   ROS-see HPI   + = positive Constitutional:    weight loss, night sweats, fevers, chills, fatigue, lassitude. HEENT:    headaches, difficulty swallowing, tooth/dental problems, sore throat,       sneezing,  itching, ear ache, nasal congestion, post nasal drip, snoring CV:    chest pain, orthopnea, PND, swelling in lower extremities, anasarca,                                   dizziness, palpitations Resp:   shortness of breath with exertion or at rest.                productive cough,   non-productive cough, coughing up of blood.              change in color of mucus.  wheezing.   Skin:    rash or lesions. GI:  No-   heartburn, indigestion, abdominal pain, nausea, vomiting, diarrhea,                 change in bowel habits, loss of appetite GU: dysuria, change in color of urine, no urgency or frequency.   flank pain. MS:   joint pain, stiffness, decreased range of motion, back pain. Neuro-     nothing unusual Psych:  change in mood or affect.  depression or anxiety.   memory loss.  OBJ- Physical Exam General- Alert, Oriented, Affect-appropriate, Distress- none acute, + power wheelchair Skin- rash-none, lesions- none, excoriation- none Lymphadenopathy- none Head- atraumatic            Eyes- Gross vision intact, PERRLA, conjunctivae and secretions clear            Ears- Hearing, canals-normal            Nose- Clear, no-Septal dev, mucus, polyps, erosion, perforation             Throat-  Mallampati II I-IV, mucosa clear , drainage- none, tonsils- atrophic Neck- flexible , trachea midline, no stridor , thyroid nl, carotid no bruit Chest - symmetrical excursion , unlabored           Heart/CV- RRR , no murmur , no gallop  , no rub, nl s1 s2                           - JVD- none , edema- none, stasis changes- none, varices- none           Lung- clear to P&A, wheeze- none, cough- none , dullness-none, rub- none           Chest wall-  Abd-  Br/ Gen/ Rectal- Not done, not indicated Extrem-+hand contractures/ limited use Neuro- grossly intact to observation

## 2021-01-25 ENCOUNTER — Ambulatory Visit (INDEPENDENT_AMBULATORY_CARE_PROVIDER_SITE_OTHER): Payer: Medicare PPO | Admitting: Internal Medicine

## 2021-01-25 ENCOUNTER — Encounter: Payer: Self-pay | Admitting: Internal Medicine

## 2021-01-25 ENCOUNTER — Other Ambulatory Visit: Payer: Self-pay

## 2021-01-25 VITALS — BP 128/72 | HR 60 | Temp 97.8°F | Ht 71.5 in

## 2021-01-25 DIAGNOSIS — G4733 Obstructive sleep apnea (adult) (pediatric): Secondary | ICD-10-CM | POA: Diagnosis not present

## 2021-01-25 DIAGNOSIS — G8254 Quadriplegia, C5-C7 incomplete: Secondary | ICD-10-CM

## 2021-01-25 NOTE — Assessment & Plan Note (Signed)
Has been compliant with benefit and good control. Machine is now old. Discussed replacement, supply chain delays, etc. Plan- replace machine- initially auto 5-20 until we can get a download.

## 2021-01-25 NOTE — Patient Instructions (Signed)
Order- DME [employer]- please replace old CPAP machine, auto 5-20, mask of choice, humidifier, supplies, AirView/ card

## 2021-01-25 NOTE — Assessment & Plan Note (Signed)
Drives herself and has managed to make a life despite considerable challenges. Seems able to manage her CPAP ok.

## 2021-01-28 ENCOUNTER — Telehealth: Payer: Self-pay | Admitting: Internal Medicine

## 2021-01-28 DIAGNOSIS — Z933 Colostomy status: Secondary | ICD-10-CM | POA: Diagnosis not present

## 2021-01-28 DIAGNOSIS — R32 Unspecified urinary incontinence: Secondary | ICD-10-CM | POA: Diagnosis not present

## 2021-01-28 DIAGNOSIS — G8250 Quadriplegia, unspecified: Secondary | ICD-10-CM | POA: Diagnosis not present

## 2021-01-28 NOTE — Telephone Encounter (Signed)
Pt stated that she contact [employer] and they do not have her order per the OV note that Dr. Annamaria Boots stated;  "Order- DME [employer]- please replace old CPAP machine, auto 5-20, mask of choice, humidifier, supplies, AirView/ card"  Pls regard; [phone]

## 2021-01-28 NOTE — Telephone Encounter (Signed)
Call made to [facility], spoke with Cherilyn. They do not have a recent cpap order.   I asked what was the best number to fax to [phone].   Order printed and faxed.   LM informing patient order has been sent.   Nothing further needed at this time.

## 2021-01-29 ENCOUNTER — Telehealth: Payer: Self-pay

## 2021-01-29 NOTE — Telephone Encounter (Signed)
[employer] called in regards to paperwork faxed on 08/08 for a wheelchair repair.   They are re-faxing paperwork over today    Call back number: [phone]

## 2021-02-01 ENCOUNTER — Other Ambulatory Visit: Payer: Self-pay | Admitting: Family Medicine

## 2021-02-02 NOTE — Telephone Encounter (Signed)
Paper work resent.

## 2021-02-08 ENCOUNTER — Other Ambulatory Visit: Payer: Self-pay | Admitting: Family Medicine

## 2021-02-16 DIAGNOSIS — M1990 Unspecified osteoarthritis, unspecified site: Secondary | ICD-10-CM | POA: Diagnosis not present

## 2021-02-16 DIAGNOSIS — M25519 Pain in unspecified shoulder: Secondary | ICD-10-CM | POA: Diagnosis not present

## 2021-02-16 DIAGNOSIS — G8254 Quadriplegia, C5-C7 incomplete: Secondary | ICD-10-CM | POA: Diagnosis not present

## 2021-02-22 ENCOUNTER — Other Ambulatory Visit: Payer: Self-pay | Admitting: Family Medicine

## 2021-03-13 DIAGNOSIS — M25519 Pain in unspecified shoulder: Secondary | ICD-10-CM | POA: Diagnosis not present

## 2021-03-13 DIAGNOSIS — M1990 Unspecified osteoarthritis, unspecified site: Secondary | ICD-10-CM | POA: Diagnosis not present

## 2021-03-13 DIAGNOSIS — G8254 Quadriplegia, C5-C7 incomplete: Secondary | ICD-10-CM | POA: Diagnosis not present

## 2021-03-15 DIAGNOSIS — G8250 Quadriplegia, unspecified: Secondary | ICD-10-CM | POA: Diagnosis not present

## 2021-03-15 DIAGNOSIS — R32 Unspecified urinary incontinence: Secondary | ICD-10-CM | POA: Diagnosis not present

## 2021-03-15 DIAGNOSIS — Z933 Colostomy status: Secondary | ICD-10-CM | POA: Diagnosis not present

## 2021-03-16 DIAGNOSIS — G4733 Obstructive sleep apnea (adult) (pediatric): Secondary | ICD-10-CM | POA: Diagnosis not present

## 2021-03-17 DIAGNOSIS — G8250 Quadriplegia, unspecified: Secondary | ICD-10-CM | POA: Diagnosis not present

## 2021-03-17 DIAGNOSIS — H16223 Keratoconjunctivitis sicca, not specified as Sjogren's, bilateral: Secondary | ICD-10-CM | POA: Diagnosis not present

## 2021-03-17 DIAGNOSIS — Z933 Colostomy status: Secondary | ICD-10-CM | POA: Diagnosis not present

## 2021-03-17 DIAGNOSIS — H02834 Dermatochalasis of left upper eyelid: Secondary | ICD-10-CM | POA: Diagnosis not present

## 2021-03-17 DIAGNOSIS — H02831 Dermatochalasis of right upper eyelid: Secondary | ICD-10-CM | POA: Diagnosis not present

## 2021-03-17 DIAGNOSIS — H43393 Other vitreous opacities, bilateral: Secondary | ICD-10-CM | POA: Diagnosis not present

## 2021-03-17 DIAGNOSIS — R32 Unspecified urinary incontinence: Secondary | ICD-10-CM | POA: Diagnosis not present

## 2021-03-17 DIAGNOSIS — H2513 Age-related nuclear cataract, bilateral: Secondary | ICD-10-CM | POA: Diagnosis not present

## 2021-03-18 DIAGNOSIS — G4733 Obstructive sleep apnea (adult) (pediatric): Secondary | ICD-10-CM | POA: Diagnosis not present

## 2021-03-23 ENCOUNTER — Other Ambulatory Visit (HOSPITAL_BASED_OUTPATIENT_CLINIC_OR_DEPARTMENT_OTHER): Payer: Self-pay

## 2021-03-23 MED ORDER — INFLUENZA VAC A&B SA ADJ QUAD 0.5 ML IM PRSY
PREFILLED_SYRINGE | INTRAMUSCULAR | 0 refills | Status: DC
Start: 2021-03-23 — End: 2023-12-27
  Filled 2021-03-23: qty 0.5, 1d supply, fill #0

## 2021-03-25 ENCOUNTER — Other Ambulatory Visit: Payer: Self-pay | Admitting: Family Medicine

## 2021-03-25 ENCOUNTER — Other Ambulatory Visit: Payer: Self-pay

## 2021-03-25 ENCOUNTER — Telehealth: Payer: Self-pay | Admitting: Family Medicine

## 2021-03-25 MED ORDER — BACLOFEN 20 MG PO TABS: ORAL_TABLET | ORAL | 0 refills | Status: DC

## 2021-03-25 NOTE — Telephone Encounter (Signed)
Medication:  baclofen (LIORESAL) 20 MG tablet   Has the patient contacted their pharmacy? Yes.   (If no, request that the patient contact the pharmacy for the refill.) (If yes, when and what did the pharmacy advise?) Medication refill was declined    Preferred Pharmacy (with phone number or street name):  [facility] - [place] [place] [address]  Phone:  [phone]  Fax:  [phone]

## 2021-03-25 NOTE — Telephone Encounter (Signed)
Medication sent.

## 2021-04-02 DIAGNOSIS — G8254 Quadriplegia, C5-C7 incomplete: Secondary | ICD-10-CM | POA: Diagnosis not present

## 2021-04-02 DIAGNOSIS — M25519 Pain in unspecified shoulder: Secondary | ICD-10-CM | POA: Diagnosis not present

## 2021-04-02 DIAGNOSIS — M1990 Unspecified osteoarthritis, unspecified site: Secondary | ICD-10-CM | POA: Diagnosis not present

## 2021-04-05 DIAGNOSIS — Z933 Colostomy status: Secondary | ICD-10-CM | POA: Diagnosis not present

## 2021-04-05 DIAGNOSIS — G8250 Quadriplegia, unspecified: Secondary | ICD-10-CM | POA: Diagnosis not present

## 2021-04-05 DIAGNOSIS — R32 Unspecified urinary incontinence: Secondary | ICD-10-CM | POA: Diagnosis not present

## 2021-04-16 DIAGNOSIS — G4733 Obstructive sleep apnea (adult) (pediatric): Secondary | ICD-10-CM | POA: Diagnosis not present

## 2021-04-21 ENCOUNTER — Ambulatory Visit (INDEPENDENT_AMBULATORY_CARE_PROVIDER_SITE_OTHER): Payer: Medicare PPO

## 2021-04-21 VITALS — Ht 71.0 in | Wt 220.0 lb

## 2021-04-21 DIAGNOSIS — Z0000 Encounter for general adult medical examination without abnormal findings: Secondary | ICD-10-CM | POA: Diagnosis not present

## 2021-04-21 NOTE — Patient Instructions (Signed)
[name] , Thank you for taking time to complete your Medicare Wellness Visit. I appreciate your ongoing commitment to your health goals. Please review the following plan we discussed and let me know if I can assist you in the future.   Screening recommendations/referrals: Colonoscopy: Follow GI recommendations Mammogram: Completed 01/18/2021-Due 01/18/2022 Bone Density: Declined Recommended yearly ophthalmology/optometry visit for glaucoma screening and checkup Recommended yearly dental visit for hygiene and checkup  Vaccinations: Influenza vaccine: Up to date Pneumococcal vaccine: Up to date Tdap vaccine: Up to date Shingles vaccine: Completed vaccines   Covid-19:Booster available at the pharmacy  Advanced directives: Copy in chart  Conditions/risks identified: See problem list  Next appointment: Follow up in one year for your annual wellness visit 04/25/2022 @ 1:00   Preventive Care 68 Years and Older, Female Preventive care refers to lifestyle choices and visits with your health care provider that can promote health and wellness. What does preventive care include? A yearly physical exam. This is also called an annual well check. Dental exams once or twice a year. Routine eye exams. Ask your health care provider how often you should have your eyes checked. Personal lifestyle choices, including: Daily care of your teeth and gums. Regular physical activity. Eating a healthy diet. Avoiding tobacco and drug use. Limiting alcohol use. Practicing safe sex. Taking low-dose aspirin every day. Taking vitamin and mineral supplements as recommended by your health care provider. What happens during an annual well check? The services and screenings done by your health care provider during your annual well check will depend on your age, overall health, lifestyle risk factors, and family history of disease. Counseling  Your health care provider may ask you questions about your: Alcohol  use. Tobacco use. Drug use. Emotional well-being. Home and relationship well-being. Sexual activity. Eating habits. History of falls. Memory and ability to understand (cognition). Work and work Statistician. Reproductive health. Screening  You may have the following tests or measurements: Height, weight, and BMI. Blood pressure. Lipid and cholesterol levels. These may be checked every 5 years, or more frequently if you are over 12 years old. Skin check. Lung cancer screening. You may have this screening every year starting at age 75 if you have a 30-pack-year history of smoking and currently smoke or have quit within the past 15 years. Fecal occult blood test (FOBT) of the stool. You may have this test every year starting at age 99. Flexible sigmoidoscopy or colonoscopy. You may have a sigmoidoscopy every 5 years or a colonoscopy every 10 years starting at age 49. Hepatitis C blood test. Hepatitis B blood test. Sexually transmitted disease (STD) testing. Diabetes screening. This is done by checking your blood sugar (glucose) after you have not eaten for a while (fasting). You may have this done every 1-3 years. Bone density scan. This is done to screen for osteoporosis. You may have this done starting at age 54. Mammogram. This may be done every 1-2 years. Talk to your health care provider about how often you should have regular mammograms. Talk with your health care provider about your test results, treatment options, and if necessary, the need for more tests. Vaccines  Your health care provider may recommend certain vaccines, such as: Influenza vaccine. This is recommended every year. Tetanus, diphtheria, and acellular pertussis (Tdap, Td) vaccine. You may need a Td booster every 10 years. Zoster vaccine. You may need this after age 79. Pneumococcal 13-valent conjugate (PCV13) vaccine. One dose is recommended after age 33. Pneumococcal polysaccharide (PPSV23)  vaccine. One dose is  recommended after age 41. Talk to your health care provider about which screenings and vaccines you need and how often you need them. This information is not intended to replace advice given to you by your health care provider. Make sure you discuss any questions you have with your health care provider. Document Released: 06/26/2015 Document Revised: 02/17/2016 Document Reviewed: 03/31/2015 Elsevier Interactive Patient Education  2017 [employer] Prevention in the Home Falls can cause injuries. They can happen to people of all ages. There are many things you can do to make your home safe and to help prevent falls. What can I do on the outside of my home? Regularly fix the edges of walkways and driveways and fix any cracks. Remove anything that might make you trip as you walk through a door, such as a raised step or threshold. Trim any bushes or trees on the path to your home. Use bright outdoor lighting. Clear any walking paths of anything that might make someone trip, such as rocks or tools. Regularly check to see if handrails are loose or broken. Make sure that both sides of any steps have handrails. Any raised decks and porches should have guardrails on the edges. Have any leaves, snow, or ice cleared regularly. Use sand or salt on walking paths during winter. Clean up any spills in your garage right away. This includes oil or grease spills. What can I do in the bathroom? Use night lights. Install grab bars by the toilet and in the tub and shower. Do not use towel bars as grab bars. Use non-skid mats or decals in the tub or shower. If you need to sit down in the shower, use a plastic, non-slip stool. Keep the floor dry. Clean up any water that spills on the floor as soon as it happens. Remove soap buildup in the tub or shower regularly. Attach bath mats securely with double-sided non-slip rug tape. Do not have throw rugs and other things on the floor that can make you  trip. What can I do in the bedroom? Use night lights. Make sure that you have a light by your bed that is easy to reach. Do not use any sheets or blankets that are too big for your bed. They should not hang down onto the floor. Have a firm chair that has side arms. You can use this for support while you get dressed. Do not have throw rugs and other things on the floor that can make you trip. What can I do in the kitchen? Clean up any spills right away. Avoid walking on wet floors. Keep items that you use a lot in easy-to-reach places. If you need to reach something above you, use a strong step stool that has a grab bar. Keep electrical cords out of the way. Do not use floor polish or wax that makes floors slippery. If you must use wax, use non-skid floor wax. Do not have throw rugs and other things on the floor that can make you trip. What can I do with my stairs? Do not leave any items on the stairs. Make sure that there are handrails on both sides of the stairs and use them. Fix handrails that are broken or loose. Make sure that handrails are as long as the stairways. Check any carpeting to make sure that it is firmly attached to the stairs. Fix any carpet that is loose or worn. Avoid having throw rugs at the top or bottom  of the stairs. If you do have throw rugs, attach them to the floor with carpet tape. Make sure that you have a light switch at the top of the stairs and the bottom of the stairs. If you do not have them, ask someone to add them for you. What else can I do to help prevent falls? Wear shoes that: Do not have high heels. Have rubber bottoms. Are comfortable and fit you well. Are closed at the toe. Do not wear sandals. If you use a stepladder: Make sure that it is fully opened. Do not climb a closed stepladder. Make sure that both sides of the stepladder are locked into place. Ask someone to hold it for you, if possible. Clearly mark and make sure that you can  see: Any grab bars or handrails. First and last steps. Where the edge of each step is. Use tools that help you move around (mobility aids) if they are needed. These include: Canes. Walkers. Scooters. Crutches. Turn on the lights when you go into a dark area. Replace any light bulbs as soon as they burn out. Set up your furniture so you have a clear path. Avoid moving your furniture around. If any of your floors are uneven, fix them. If there are any pets around you, be aware of where they are. Review your medicines with your doctor. Some medicines can make you feel dizzy. This can increase your chance of falling. Ask your doctor what other things that you can do to help prevent falls. This information is not intended to replace advice given to you by your health care provider. Make sure you discuss any questions you have with your health care provider. Document Released: 03/26/2009 Document Revised: 11/05/2015 Document Reviewed: 07/04/2014 Elsevier Interactive Patient Education  2017 [employer].

## 2021-04-21 NOTE — Progress Notes (Addendum)
Subjective:   [name] is a 70 y.o. female who presents for Medicare Annual (Subsequent) preventive examination.  I connected with [name] today by telephone and verified that I am speaking with the correct person using two identifiers. Location patient: home Location provider: work Persons participating in the virtual visit: patient, Marine scientist.    I discussed the limitations, risks, security and privacy concerns of performing an evaluation and management service by telephone and the availability of in person appointments. I also discussed with the patient that there may be a patient responsible charge related to this service. The patient expressed understanding and verbally consented to this telephonic visit.    Interactive audio and video telecommunications were attempted between this provider and patient, however failed, due to patient having technical difficulties OR patient did not have access to video capability.  We continued and completed visit with audio only.  Some vital signs may be absent or patient reported.   Time Spent with patient on telephone encounter: 20 minutes   Review of Systems     Cardiac Risk Factors include: advanced age (>67men, >20 women);hypertension;dyslipidemia;obesity (BMI >30kg/m2)     Objective:    Today's Vitals   04/21/21 1259  Weight: 220 lb (99.8 kg)  Height: 5\' 11"  (1.803 m)   Body mass index is 30.68 kg/m.  Advanced Directives 01/01/2020 06/21/2018 08/07/2017 06/21/2016 10/21/2014 08/27/2014  Does Patient Have a Medical Advance Directive? Yes Yes Yes Yes Yes Yes  Type of Advance Directive - [place];Living will [place];Living will [place];Living will [place];Living will [place]  Does patient want to make changes to medical advance directive? No - Patient declined No - Patient declined - - Yes - information given No - Patient declined  Copy of  [place] in Chart? - No - copy requested Yes No - copy requested Yes No - copy requested    Current Medications (verified) Outpatient Encounter Medications as of 04/21/2021  Medication Sig   baclofen (LIORESAL) 20 MG tablet TAKE 1/2 TO 1 TABLET BY MOUTH FOUR TIMESDAILY AS NEEDED FOR MUSCLE SPASMS   clobetasol (TEMOVATE) 0.05 % external solution Apply 1 application topically every Monday, Wednesday, and Friday. Use on scalp 3 times per week   docusate sodium (COLACE) 100 MG capsule Take 1 tablet by mouth daily.   ELDERBERRY PO Take 2 each by mouth daily.   FIBER PO Take 1 capsule by mouth daily.   hydrochlorothiazide (MICROZIDE) 12.5 MG capsule Take 1 capsule (12.5 mg total) by mouth daily.   lisinopril (ZESTRIL) 5 MG tablet TAKE 1 TABLET BY MOUTH DAILY   MELATONIN GUMMIES PO Take 5 mg by mouth at bedtime.   Multiple Vitamins-Calcium (ONE-A-DAY WOMENS FORMULA PO) Take 1 tablet by mouth daily.   OXYBUTYNIN CHLORIDE ER PO Take 10 mg by mouth at bedtime.   Probiotic Product (DIGESTIVE ADVANTAGE) CAPS Take 1 tablet by mouth daily.   rosuvastatin (CRESTOR) 5 MG tablet TAKE 1 TABLET BY MOUTH ONCE DAILY   influenza vaccine adjuvanted (FLUAD) 0.5 ML injection Inject into the muscle. (Patient not taking: Reported on 04/21/2021)   [DISCONTINUED] oxybutynin (DITROPAN-XL) 10 MG 24 hr tablet Take 10 mg by mouth at bedtime.   No facility-administered encounter medications on file as of 04/21/2021.    Allergies (verified) Adhesive [tape]   History: Past Medical History:  Diagnosis Date   Acute bronchitis 05/16/2015   Anemia    after shoulder  replacement   Autonomic dysreflexia    AVM (arteriovenous malformation) spine 1981   Bowel dysfunction    constipation and fecal retention   Cerumen impaction 3/73/4287   Complication of anesthesia    low temperature after general anesthesia for colostomy   Constipation 01/25/2016   Contusion 05/18/2014   Of foot   Depression 02/13/2017    History of    Headache 04/09/2017   History of kidney stones    History of neurogenic bowel    History of rotator cuff tear    Right   History of ventral hernia    Hyperglycemia 05/18/2015   Hyperlipidemia    Hypertension    No medications at this time   Incomplete quadriplegia at C5-6 level ([place]) 07/24/2008   Qualifier: Diagnosis of  By: Wynona Luna Requires bowel regimen including enemas and indwelling foley cath Has had tendency towards pressure sores in past. Had a tear with a transfer in [facility] in past for surgery and developed a sore at that time.    Low blood pressure reading    Occassionally   Medicare annual wellness visit, subsequent 11/02/2014   Follows with [facility] Declines colonoscopy  No h/o abnormal pap, declines further paps   MVP (mitral valve prolapse) 12/01/2012   Mild   Neurogenic bladder    OA (osteoarthritis)    Hip and shoulders, neck   Obesity 11/01/2016   OSA on CPAP 01/31/2016   Pain in joint, shoulder region 12/01/2012   Long history B/l Follows with Dr Hassell Done at [facility]   Postmenopausal bleeding 12/17/2012   Preventative health care 06/26/2013   Quadriplegia ([place]) 1981   Secondary to ruptured AVM C5-C6   Sacral pressure ulcer 12/01/2012   H/o Follows with Dr Tomi Likens of Dermatology   Skin cancer 11/01/2016   2 BCC RLE excised by Dr Wilhemina Bonito 2018 [place] LLE excised by Dr Wilhemina Bonito 2018   Vaginitis and vulvovaginitis 12/17/2012   Wears glasses    Past Surgical History:  Procedure Laterality Date   ANAL TAG REMOVAL  2014   BASAL CELL CARCINOMA EXCISION  2016   Lip   BASAL CELL CARCINOMA EXCISION  2018   right leg x2   BASAL CELL CARCINOMA EXCISION Left 2018   leg, chest   BASAL CELL CARCINOMA EXCISION Right 2019   leg   BLADDER STONE REMOVAL  1986/87   COLONOSCOPY  02/22/2016   COLOSTOMY  02/22/2016   CYSTOSCOPY WITH BIOPSY N/A 06/21/2018   Procedure: CYSTOSCOPY WITH BIOPSY FULGURATION;  Surgeon: Irine Seal, MD;  Location:  [facility];  Service: Urology;  Laterality: N/A;   CYSTOSCOPY WITH LITHOLAPAXY N/A 06/21/2018   Procedure: CYSTOSCOPY WITH LITHOLAPAXY;  Surgeon: Irine Seal, MD;  Location: [facility];  Service: Urology;  Laterality: N/A;   DORSAL CHEILECTOMY-HALLUX RIGIDUS Bilateral 2011   HERNIA REPAIR  6811   VENTRAL umbilical hernia repair   LAMINECTOMY  10/23/1979   AVM   LAPAROSCOPIC SIGMOID COLECTOMY  02/22/2016   ROTATOR CUFF REPAIR  1996, 1997   left shoulder   ROTATOR CUFF REPAIR Right 2001   SHOULDER ARTHROSCOPY  2007   right   SQUAMOUS CELL CARCINOMA EXCISION Left 2018   leg   Toe Nails Removal Bilateral 1981   both great toes   TOTAL SHOULDER REPLACEMENT  2001   left   TRANSFER TENDON HAND  1982, 1983   to right hand X3   VENTRAL HERNIA REPAIR  08/17/2010   Postop ileus (Dr. Redmond Pulling)   Family History  Problem Relation Age of Onset   Cancer Mother        breast & melanoma   Hypertension Mother    Heart disease Father        CAD, MI, AFib   Cancer Sister        melanoma   Cancer Maternal Grandfather        melanoma   Stroke Paternal Grandmother    Heart disease Paternal Grandfather    Cancer Maternal Aunt        breast   Cancer Paternal Aunt        breast   Cancer Paternal Uncle        breast   Cancer Paternal Aunt        breast   Social History   Socioeconomic History   Marital status: Single    Spouse name: Not on file   Number of children: Not on file   Years of education: Not on file   Highest education level: Not on file  Occupational History   Occupation: Disabled    Comment: Music therapist  Tobacco Use   Smoking status: Never   Smokeless tobacco: Never  Vaping Use   Vaping Use: Never used  Substance and Sexual Activity   Alcohol use: No   Drug use: No   Sexual activity: Not on file    Comment: lives with sister and mother, no major dietary restrictions  Other Topics Concern   Not on file  Social History Narrative    Sister is primary caregiver   Social Determinants of Health   Financial Resource Strain: Low Risk    Difficulty of Paying Living Expenses: Not hard at all  Food Insecurity: No Food Insecurity   Worried About Charity fundraiser in the Last Year: Never true   [place] in the Last Year: Never true  Transportation Needs: No Transportation Needs   Lack of Transportation (Medical): No   Lack of Transportation (Non-Medical): No  Physical Activity: Inactive   Days of Exercise per Week: 0 days   Minutes of Exercise per Session: 0 min  Stress: No Stress Concern Present   Feeling of Stress : Not at all  Social Connections: Moderately Isolated   Frequency of Communication with Friends and Family: More than three times a week   Frequency of Social Gatherings with Friends and Family: More than three times a week   Attends Religious Services: More than 4 times per year   Active Member of [employer] or Organizations: No   Attends Music therapist: Never   Marital Status: Never married    Tobacco Counseling Counseling given: Not Answered   Clinical Intake:  Pre-visit preparation completed: Yes        BMI - recorded: 30.68 Nutritional Status: BMI > 30  Obese Nutritional Risks: None Diabetes: No  How often do you need to have someone help you when you read instructions, pamphlets, or other written materials from your doctor or pharmacy?: 1 - Never  Diabetic?No  Interpreter Needed?: No  Information entered by :: Caroleen Hamman LPN   Activities of Daily Living In your present state of health, do you have any difficulty performing the following activities: 04/21/2021  Hearing? N  Vision? N  Difficulty concentrating or making decisions? N  Walking or climbing stairs? Y  Comment wheelchair  Dressing or bathing? Y  Doing errands, shopping? Y  Conservation officer, nature  and eating ? Y  Using the Toilet? N  In the past six months, have you accidently leaked urine? (No Data)   Comment indwelling catheter  Do you have problems with loss of bowel control? (No Data)  Comment Colostomy  Managing your Medications? N  Managing your Finances? N  Housekeeping or managing your Housekeeping? N  Some recent data might be hidden    Patient Care Team: Mosie Lukes, MD as PCP - General (Family Medicine) Irine Seal, MD as Attending Physician (Urology) Clent Jacks, MD as Consulting Physician (Ophthalmology) Laurance Flatten, MD as Consulting Physician (Orthopedic Surgery) Cheryll Cockayne, MD as Consulting Physician (Surgical Oncology) Ronald Lobo, MD as Consulting Physician (Gastroenterology) Danella Sensing, MD as Consulting Physician (Dermatology)  Indicate any recent Medical Services you may have received from other than [facility] providers in the past year (date may be approximate).     Assessment:   This is a routine wellness examination for [facility].  Hearing/Vision screen Hearing Screening - Comments:: No issues Vision Screening - Comments:: Last eye exam-03/2021-Dr. Groat  Dietary issues and exercise activities discussed: Current Exercise Habits: The patient does not participate in regular exercise at present, Exercise limited by: Other - see comments (paraplegic)   Goals Addressed             This Visit's Progress    Patient Stated       Maintain current health       Depression Screen PHQ 2/9 Scores 04/21/2021 06/21/2016  PHQ - 2 Score 0 0    Fall Risk Fall Risk  04/21/2021 06/21/2016  Falls in the past year? 0 No  Number falls in past yr: 0 -  Injury with Fall? 0 -  Follow up Falls prevention discussed -    FALL RISK PREVENTION PERTAINING TO THE HOME:  Any stairs in or around the home? Yes  If so, are there any without handrails? No  Home free of loose throw rugs in walkways, pet beds, electrical cords, etc? Yes  Adequate lighting in your home to reduce risk of falls? Yes   ASSISTIVE DEVICES UTILIZED TO PREVENT FALLS:  Life alert? No   Use of a cane, walker or w/c? Yes wheelchair Grab bars in the bathroom? Yes  Shower chair or bench in shower? Yes  Elevated toilet seat or a handicapped toilet? No   TIMED UP AND GO:  Was the test performed? No . Phone visit   Cognitive Function:Normal cognitive status assessed by this Nurse Health Advisor. No abnormalities found.   MMSE - Mini Mental State Exam 06/21/2016  Orientation to time 5  Orientation to Place 5  Registration 3  Attention/ Calculation 5  Recall 3  Language- name 2 objects 2  Language- repeat 1  Language- follow 3 step command 3  Language- read & follow direction 1  Write a sentence 1  Copy design 1  Total score 30        Immunizations Immunization History  Administered Date(s) Administered   Fluad Quad(high Dose 65+) 03/14/2019, 03/23/2021   H1N1 07/24/2008   Influenza Split 04/05/2011, 04/18/2012   Influenza Whole 06/03/2008, 03/31/2009, 04/01/2010   Influenza, High Dose Seasonal PF 02/19/2018   Influenza,inj,Quad PF,6+ Mos 03/20/2013, 03/03/2014, 05/18/2015, 04/20/2016, 04/06/2017   Moderna Sars-Covid-2 Vaccination 11/02/2019, 11/30/2019, 06/15/2020   Pneumococcal Conjugate-13 06/24/2013   Pneumococcal Polysaccharide-23 12/07/2005, 03/21/2019   Pneumococcal-Unspecified 12/07/2005, 06/24/2013   Td 06/03/2008   Tdap 03/06/2019   Zoster Recombinat (Shingrix) 07/13/2018, 03/06/2019   Zoster, Live 11/26/2012  TDAP status: Up to date  Flu Vaccine status: Up to date  Pneumococcal vaccine status: Up to date  Covid-19 vaccine status: Information provided on how to obtain vaccines.   Qualifies for Shingles Vaccine? No   Zostavax completed Yes   Shingrix Completed?: Yes  Screening Tests Health Maintenance  Topic Date Due   DEXA SCAN  Never done   Fecal DNA (Cologuard)  05/23/2018   COVID-19 Vaccine (4 - Booster for Moderna series) 08/10/2020   MAMMOGRAM  01/19/2023   TETANUS/TDAP  03/05/2029   Pneumonia Vaccine 65+ Years old   Completed   INFLUENZA VACCINE  Completed   Hepatitis C Screening  Completed   Zoster Vaccines- Shingrix  Completed   HPV VACCINES  Aged Out    Health Maintenance  Health Maintenance Due  Topic Date Due   DEXA SCAN  Never done   Fecal DNA (Cologuard)  05/23/2018   COVID-19 Vaccine (4 - Booster for Moderna series) 08/10/2020    Colorectal cancer screening: Patient has a colostomy. Followed by GI  Mammogram status: Completed bilateral 01/18/2021. Repeat every year  Bone Density status: Declined  Lung Cancer Screening: (Low Dose CT Chest recommended if Age 10-80 years, 30 pack-year currently smoking OR have quit w/in 15years.) does not qualify.     Additional Screening:  Hepatitis C Screening: Completed 05/18/2015  Vision Screening: Recommended annual ophthalmology exams for early detection of glaucoma and other disorders of the eye. Is the patient up to date with their annual eye exam?  Yes  Who is the provider or what is the name of the office in which the patient attends annual eye exams? Dr. Katy Fitch   Dental Screening: Recommended annual dental exams for proper oral hygiene  Community Resource Referral / Chronic Care Management: CRR required this visit?  No   CCM required this visit?  No      Plan:     I have personally reviewed and noted the following in the patient's chart:   Medical and social history Use of alcohol, tobacco or illicit drugs  Current medications and supplements including opioid prescriptions.  Functional ability and status Nutritional status Physical activity Advanced directives List of other physicians Hospitalizations, surgeries, and ER visits in previous 12 months Vitals Screenings to include cognitive, depression, and falls Referrals and appointments  In addition, I have reviewed and discussed with patient certain preventive protocols, quality metrics, and best practice recommendations. A written personalized care plan for preventive  services as well as general preventive health recommendations were provided to patient.   Due to this being a telephonic visit, the after visit summary with patients personalized plan was offered to patient via mail or my-chart.  Patient would like to access on my-chart.   Marta Antu, LPN   33/08/5454  Nurse Health Advisor  Nurse Notes: None  I have reviewed and agree with Health Coaches documentation.  Kathlene November, MD

## 2021-04-28 DIAGNOSIS — D2272 Melanocytic nevi of left lower limb, including hip: Secondary | ICD-10-CM | POA: Diagnosis not present

## 2021-04-28 DIAGNOSIS — D225 Melanocytic nevi of trunk: Secondary | ICD-10-CM | POA: Diagnosis not present

## 2021-04-28 DIAGNOSIS — L821 Other seborrheic keratosis: Secondary | ICD-10-CM | POA: Diagnosis not present

## 2021-04-28 DIAGNOSIS — D2271 Melanocytic nevi of right lower limb, including hip: Secondary | ICD-10-CM | POA: Diagnosis not present

## 2021-04-28 DIAGNOSIS — Z85828 Personal history of other malignant neoplasm of skin: Secondary | ICD-10-CM | POA: Diagnosis not present

## 2021-04-28 DIAGNOSIS — D485 Neoplasm of uncertain behavior of skin: Secondary | ICD-10-CM | POA: Diagnosis not present

## 2021-04-28 DIAGNOSIS — L570 Actinic keratosis: Secondary | ICD-10-CM | POA: Diagnosis not present

## 2021-04-28 DIAGNOSIS — L820 Inflamed seborrheic keratosis: Secondary | ICD-10-CM | POA: Diagnosis not present

## 2021-04-28 DIAGNOSIS — L814 Other melanin hyperpigmentation: Secondary | ICD-10-CM | POA: Diagnosis not present

## 2021-05-10 DIAGNOSIS — M25519 Pain in unspecified shoulder: Secondary | ICD-10-CM | POA: Diagnosis not present

## 2021-05-10 DIAGNOSIS — M1990 Unspecified osteoarthritis, unspecified site: Secondary | ICD-10-CM | POA: Diagnosis not present

## 2021-05-10 DIAGNOSIS — G8254 Quadriplegia, C5-C7 incomplete: Secondary | ICD-10-CM | POA: Diagnosis not present

## 2021-05-11 ENCOUNTER — Other Ambulatory Visit: Payer: Self-pay | Admitting: Family Medicine

## 2021-05-21 ENCOUNTER — Other Ambulatory Visit: Payer: Self-pay | Admitting: Family Medicine

## 2021-05-21 ENCOUNTER — Telehealth: Payer: Self-pay | Admitting: Family Medicine

## 2021-05-21 MED ORDER — AZITHROMYCIN 250 MG PO TABS: ORAL_TABLET | ORAL | 0 refills | Status: AC

## 2021-05-21 NOTE — Telephone Encounter (Signed)
Pt aware.

## 2021-05-21 NOTE — Telephone Encounter (Signed)
Pt called in and stated she has been having a cough and congestion for about 10days now. She has been taking sinusmax mucinex but that doesn't seem to be helping. I informed her we dont have any more visits until next week.She wanted to see if dr blyth had any suggestions to help her feel better. She truly feels its sinuses.

## 2021-06-21 ENCOUNTER — Other Ambulatory Visit: Payer: Self-pay | Admitting: Family Medicine

## 2021-07-30 ENCOUNTER — Other Ambulatory Visit: Payer: Self-pay | Admitting: Family Medicine

## 2021-08-09 ENCOUNTER — Other Ambulatory Visit: Payer: Self-pay | Admitting: Family Medicine

## 2021-08-16 ENCOUNTER — Telehealth: Payer: Self-pay | Admitting: Family Medicine

## 2021-08-16 NOTE — Telephone Encounter (Signed)
Documents faxed to [employer] company. Awaiting fax confirmation.  ?

## 2021-08-16 NOTE — Telephone Encounter (Signed)
Pt is wheelchair bound & DME company states they do not have the proper documents in order for pt to have wheelchair repaired  ? ?Please advise  ?

## 2021-08-17 NOTE — Telephone Encounter (Signed)
Successful fax confirmation received.

## 2021-08-17 NOTE — Telephone Encounter (Signed)
Forms refaxed, awaiting confirmation.  ?

## 2021-08-17 NOTE — Telephone Encounter (Signed)
Please put attention to [facility] at [employer] and refax to [phone].  ?

## 2021-08-23 ENCOUNTER — Other Ambulatory Visit: Payer: Self-pay | Admitting: Family Medicine

## 2021-08-30 DIAGNOSIS — N310 Uninhibited neuropathic bladder, not elsewhere classified: Secondary | ICD-10-CM | POA: Diagnosis not present

## 2021-08-30 DIAGNOSIS — N200 Calculus of kidney: Secondary | ICD-10-CM | POA: Diagnosis not present

## 2021-08-30 DIAGNOSIS — N3020 Other chronic cystitis without hematuria: Secondary | ICD-10-CM | POA: Diagnosis not present

## 2021-09-03 ENCOUNTER — Other Ambulatory Visit: Payer: Self-pay | Admitting: Medical

## 2021-09-22 ENCOUNTER — Other Ambulatory Visit: Payer: Self-pay | Admitting: Family Medicine

## 2021-09-28 ENCOUNTER — Encounter: Payer: Self-pay | Admitting: Medical

## 2021-09-28 ENCOUNTER — Ambulatory Visit (INDEPENDENT_AMBULATORY_CARE_PROVIDER_SITE_OTHER): Payer: Medicare PPO | Admitting: Medical

## 2021-09-28 VITALS — BP 137/77 | HR 60 | Temp 98.1°F | Resp 18 | Ht 71.0 in | Wt 220.0 lb

## 2021-09-28 DIAGNOSIS — R739 Hyperglycemia, unspecified: Secondary | ICD-10-CM

## 2021-09-28 DIAGNOSIS — E119 Type 2 diabetes mellitus without complications: Secondary | ICD-10-CM | POA: Diagnosis not present

## 2021-09-28 DIAGNOSIS — E785 Hyperlipidemia, unspecified: Secondary | ICD-10-CM

## 2021-09-28 DIAGNOSIS — I10 Essential (primary) hypertension: Secondary | ICD-10-CM | POA: Diagnosis not present

## 2021-09-28 DIAGNOSIS — K5909 Other constipation: Secondary | ICD-10-CM | POA: Diagnosis not present

## 2021-09-28 DIAGNOSIS — E1165 Type 2 diabetes mellitus with hyperglycemia: Secondary | ICD-10-CM

## 2021-09-28 DIAGNOSIS — M62838 Other muscle spasm: Secondary | ICD-10-CM

## 2021-09-28 LAB — LIPID PANEL
Cholesterol: 174 mg/dL (ref 0–200)
HDL: 44.4 mg/dL (ref >39.00)
LDL Cholesterol: 101 mg/dL — ABNORMAL HIGH (ref 0–99)
NonHDL: 129.28
Total CHOL/HDL Ratio: 4
Triglycerides: 140 mg/dL (ref 0.0–149.0)
VLDL: 28 mg/dL (ref 0.0–40.0)

## 2021-09-28 LAB — COMPREHENSIVE METABOLIC PANEL
ALT: 15 U/L (ref 0–35)
AST: 12 U/L (ref 0–37)
Albumin: 4.3 g/dL (ref 3.5–5.2)
Alkaline Phosphatase: 90 U/L (ref 39–117)
BUN: 7 mg/dL (ref 6–23)
CO2: 31 mEq/L (ref 19–32)
Calcium: 9.5 mg/dL (ref 8.4–10.5)
Chloride: 99 mEq/L (ref 96–112)
Creatinine, Ser: 0.36 mg/dL — ABNORMAL LOW (ref 0.40–1.20)
GFR: 103.95 mL/min (ref >60.00)
Glucose, Bld: 95 mg/dL (ref 70–99)
Potassium: 4.4 mEq/L (ref 3.5–5.1)
Sodium: 138 mEq/L (ref 135–145)
Total Bilirubin: 0.5 mg/dL (ref 0.2–1.2)
Total Protein: 7.4 g/dL (ref 6.0–8.3)

## 2021-09-28 LAB — HEMOGLOBIN A1C: Hgb A1c MFr Bld: 6.5 % (ref 4.6–6.5)

## 2021-09-28 MED ORDER — DOCUSATE SODIUM 100 MG PO CAPS: 100.0000 mg | ORAL_CAPSULE | Freq: Every day | ORAL | 2 refills | Status: AC

## 2021-09-28 MED ORDER — BACLOFEN 20 MG PO TABS: ORAL_TABLET | ORAL | 1 refills | Status: DC

## 2021-09-28 MED ORDER — ROSUVASTATIN CALCIUM 5 MG PO TABS: 5.0000 mg | ORAL_TABLET | Freq: Every day | ORAL | 3 refills | Status: DC

## 2021-09-28 NOTE — Addendum Note (Signed)
Addended by: Anabel Halon on: 09/28/2021 07:52 PM ? ? Modules accepted: Orders ? ?

## 2021-09-28 NOTE — Progress Notes (Signed)
? ?Subjective:  ? ? Patient ID: [name], female    DOB: March 07, 1951, 71 y.o.   MRN: [id number] ? ?HPI ?Pt bp levels are 105/77, 142/83, 89/45, 111/75, 125/61, 167/107 and 135/101. Pulses have been 50-85 range. ? ?Pt states usually bp lower in morning. ? ?Pt states her bp are seldom low in afternoons. She states rare low about 2-3 times a month.  ? ?Bp meds lisinopril and hctz.  ? ?Pt is diabetic on chart review. No on diabetic meds. ? ?Pt has high triglycerides. Pt is on crestor. ? ? ?Review of Systems  ?Constitutional:  Negative for chills, fatigue and fever.  ?HENT:  Negative for congestion and ear discharge.   ?Respiratory:  Negative for cough, chest tightness, shortness of breath and wheezing.   ?Cardiovascular:  Negative for chest pain and palpitations.  ?Gastrointestinal:  Negative for abdominal pain.  ?Genitourinary:  Negative for dysuria, flank pain and frequency.  ?Musculoskeletal:  Negative for arthralgias and back pain.  ?Skin:  Negative for rash.  ?Neurological:  Negative for dizziness, syncope, weakness, light-headedness, numbness and headaches.  ?Hematological:  Negative for adenopathy.  ?Psychiatric/Behavioral:  Negative for behavioral problems, confusion and sleep disturbance. The patient is not nervous/anxious.   ? ? ? ?Past Medical History:  ?Diagnosis Date  ? Acute bronchitis 05/16/2015  ? Anemia   ? after shoulder replacement  ? Autonomic dysreflexia   ? AVM (arteriovenous malformation) spine 1981  ? Bowel dysfunction   ? constipation and fecal retention  ? Cerumen impaction 10/11/2015  ? Complication of anesthesia   ? low temperature after general anesthesia for colostomy  ? Constipation 01/25/2016  ? Contusion 05/18/2014  ? Of foot  ? Depression 02/13/2017  ? History of   ? Headache 04/09/2017  ? History of kidney stones   ? History of neurogenic bowel   ? History of rotator cuff tear   ? Right  ? History of ventral hernia   ? Hyperglycemia 05/18/2015  ? Hyperlipidemia   ? Hypertension   ? No  medications at this time  ? Incomplete quadriplegia at C5-6 level ([place]) 07/24/2008  ? Qualifier: Diagnosis of  By: Wynona Luna Requires bowel regimen including enemas and indwelling foley cath Has had tendency towards pressure sores in past. Had a tear with a transfer in Hospital in past for surgery and developed a sore at that time.   ? Low blood pressure reading   ? Occassionally  ? Medicare annual wellness visit, subsequent 11/02/2014  ? Follows with [facility] Declines colonoscopy  No h/o abnormal pap, declines further paps  ? MVP (mitral valve prolapse) 12/01/2012  ? Mild  ? Neurogenic bladder   ? OA (osteoarthritis)   ? Hip and shoulders, neck  ? Obesity 11/01/2016  ? OSA on CPAP 01/31/2016  ? Pain in joint, shoulder region 12/01/2012  ? Long history B/l Follow[facility] with [facility] Hassell [facility] at [facility]  ? Postmenopausal bleeding 12/17/2012  ? Preventative health care 06/26/2013  ? Quadriplegia ([place]) 1981  ? Secondary to ruptured AVM C5-C6  ? Sacral pressure ulcer 12/01/2012  ? H/o Follows with Dr Tomi Likens of Dermatology  ? Skin cancer 11/01/2016  ? 2 BCC RLE excised by Dr Wilhemina Bonito 2018 [place] LLE excised by Dr Wilhemina Bonito 2018  ? Vaginitis and vulvovaginitis 12/17/2012  ? Wears glasses   ? ?  ?Social History  ? ?Socioeconomic History  ? Marital status: Single  ?  Spouse name: Not on file  ?  Number of children: Not on file  ? Years of education: Not on file  ? Highest education level: Not on file  ?Occupational History  ? Occupation: Disabled  ?  Comment: math teacher  ?Tobacco Use  ? Smoking status: Never  ? Smokeless tobacco: Never  ?Vaping Use  ? Vaping Use: Never used  ?Substance and Sexual Activity  ? Alcohol use: No  ? Drug use: No  ? Sexual activity: Not on file  ?  Comment: lives with sister and mother, no major dietary restrictions  ?Other Topics Concern  ? Not on file  ?Social History Narrative  ? Sister is primary caregiver  ? ?Social Determinants of Health  ? ?Financial Resource Strain: Low Risk   ?  Difficulty of Paying Living Expenses: Not hard at all  ?Food Insecurity: No Food Insecurity  ? Worried About Charity fundraiser in the Last Year: Never true  ? Ran Out of Food in the Last Year: Never true  ?Transportation Needs: No Transportation Needs  ? Lack of Transportation (Medical): No  ? Lack of Transportation (Non-Medical): No  ?Physical Activity: Inactive  ? Days of Exercise per Week: 0 days  ? Minutes of Exercise per Session: 0 min  ?Stress: No Stress Concern Present  ? Feeling of Stress : Not at all  ?Social Connections: Moderately Isolated  ? Frequency of Communication with Friends and Family: More than three times a week  ? Frequency of Social Gatherings with Friends and Family: More than three times a week  ? Attends Religious Services: More than 4 times per year  ? Active Member of Clubs or Organizations: No  ? Attends Archivist Meetings: Never  ? Marital Status: Never married  ?Intimate Partner Violence: Not At Risk  ? Fear of Current or Ex-Partner: No  ? Emotionally Abused: No  ? Physically Abused: No  ? Sexually Abused: No  ? ? ?Past Surgical History:  ?Procedure Laterality Date  ? ANAL TAG REMOVAL  2014  ? BASAL CELL CARCINOMA EXCISION  2016  ? Lip  ? BASAL CELL CARCINOMA EXCISION  2018  ? right leg x2  ? BASAL CELL CARCINOMA EXCISION Left 2018  ? leg, chest  ? BASAL CELL CARCINOMA EXCISION Right 2019  ? leg  ? BLADDER STONE REMOVAL  1986/87  ? COLONOSCOPY  02/22/2016  ? COLOSTOMY  02/22/2016  ? CYSTOSCOPY WITH BIOPSY N/A 06/21/2018  ? Procedure: CYSTOSCOPY WITH BIOPSY FULGURATION;  Surgeon: Irine Seal, MD;  Location: [facility];  Service: Urology;  Laterality: N/A;  ? CYSTOSCOPY WITH LITHOLAPAXY N/A 06/21/2018  ? Procedure: CYSTOSCOPY WITH LITHOLAPAXY;  Surgeon: Irine Seal, MD;  Location: [facility];  Service: Urology;  Laterality: N/A;  ? DORSAL CHEILECTOMY-HALLUX RIGIDUS Bilateral 2011  ? HERNIA REPAIR  2012  ? VENTRAL umbilical hernia repair  ?  LAMINECTOMY  10/23/1979  ? AVM  ? LAPAROSCOPIC SIGMOID COLECTOMY  02/22/2016  ? [place]  ? left shoulder  ? ROTATOR CUFF REPAIR Right 2001  ? SHOULDER ARTHROSCOPY  2007  ? right  ? SQUAMOUS CELL CARCINOMA EXCISION Left 2018  ? leg  ? Toe Nails Removal Bilateral 1981  ? both great toes  ? TOTAL SHOULDER REPLACEMENT  2001  ? left  ? TRANSFER TENDON HAND  1982, 1983  ? to right hand X3  ? VENTRAL HERNIA REPAIR  08/17/2010  ? Postop ileus (Dr. Redmond Pulling)  ? ? ?Family History  ?Problem Relation Age  of Onset  ? Cancer Mother   ?     breast & melanoma  ? Hypertension Mother   ? Heart disease Father   ?     CAD, MI, AFib  ? Cancer Sister   ?     melanoma  ? Cancer Maternal Grandfather   ?     melanoma  ? Stroke Paternal Grandmother   ? Heart disease Paternal Grandfather   ? Cancer Maternal Aunt   ?     breast  ? Cancer Paternal Aunt   ?     breast  ? Cancer Paternal Uncle   ?     breast  ? Cancer Paternal Aunt   ?     breast  ? ? ?Allergies  ?Allergen Reactions  ? Adhesive [Tape] Other (See Comments)  ?  Skin irritation ?Does not tolerate Convotech, tolerates Hollister  ? ? ?Current Outpatient Medications on File Prior to Visit  ?Medication Sig Dispense Refill  ? baclofen (LIORESAL) 20 MG tablet TAKE 1/2 TO 1 TABLET BY MOUTH FOUR TIMESDAILY AS NEEDED FOR MUSCLE SPASMS 90 each 0  ? clobetasol (TEMOVATE) 0.05 % external solution Apply 1 application topically every Monday, Wednesday, and Friday. Use on scalp 3 times per week    ? docusate sodium (COLACE) 100 MG capsule Take 1 tablet by mouth daily.    ? ELDERBERRY PO Take 2 each by mouth daily.    ? FIBER PO Take 1 capsule by mouth daily.    ? hydrochlorothiazide (MICROZIDE) 12.5 MG capsule TAKE 1 CAPSULE BY MOUTH DAILY 30 capsule 0  ? influenza vaccine adjuvanted (FLUAD) 0.5 ML injection Inject into the muscle. 0.5 mL 0  ? lisinopril (ZESTRIL) 5 MG tablet TAKE 1 TABLET BY MOUTH DAILY *NEEDS LABS/APPOINTMENT FOR FURTHER REFILLS* 90 tablet 0  ? MELATONIN  GUMMIES PO Take 5 mg by mouth at bedtime.    ? Multiple Vitamins-Calcium (ONE-A-DAY WOMENS FORMULA PO) Take 1 tablet by mouth daily.    ? OXYBUTYNIN CHLORIDE ER PO Take 10 mg by mouth at bedtime.    ? Probiotic Produ

## 2021-09-28 NOTE — Patient Instructions (Addendum)
Htn- you blood pressure varies with most of readings in good range but occasional high and rare very low. Today bp is good/acceptable. Want you to check bp in morning and late afternoon for next 4 days. Update me by my chart and will review today readings and next 4 days with your pcp. I would not recommend changes presently. Continue both lisinopril and hctz. ? ?For diabetes will check cmp and a1c today. Well make sure still well controlled with diet alone. ? ?For high cholesterol will repeat lipid panel today continue crestor. ? ?Refilled muscle relaxant baclofen  today for muscle spasms ? ?Refiled colace for constipation. ? ?Follow up date to be determined after lab review. ? ? ? ? ? ? ? ? ?

## 2021-10-04 ENCOUNTER — Other Ambulatory Visit: Payer: Self-pay | Admitting: Family Medicine

## 2021-10-05 ENCOUNTER — Encounter: Payer: Self-pay | Admitting: Medical

## 2021-10-25 DIAGNOSIS — L570 Actinic keratosis: Secondary | ICD-10-CM | POA: Diagnosis not present

## 2021-10-25 DIAGNOSIS — D485 Neoplasm of uncertain behavior of skin: Secondary | ICD-10-CM | POA: Diagnosis not present

## 2021-10-25 DIAGNOSIS — L821 Other seborrheic keratosis: Secondary | ICD-10-CM | POA: Diagnosis not present

## 2021-10-25 DIAGNOSIS — L814 Other melanin hyperpigmentation: Secondary | ICD-10-CM | POA: Diagnosis not present

## 2021-10-25 DIAGNOSIS — L820 Inflamed seborrheic keratosis: Secondary | ICD-10-CM | POA: Diagnosis not present

## 2021-10-25 DIAGNOSIS — D1801 Hemangioma of skin and subcutaneous tissue: Secondary | ICD-10-CM | POA: Diagnosis not present

## 2021-10-25 DIAGNOSIS — Z85828 Personal history of other malignant neoplasm of skin: Secondary | ICD-10-CM | POA: Diagnosis not present

## 2021-10-27 ENCOUNTER — Other Ambulatory Visit: Payer: Self-pay | Admitting: Family Medicine

## 2021-10-30 DIAGNOSIS — M25519 Pain in unspecified shoulder: Secondary | ICD-10-CM | POA: Diagnosis not present

## 2021-10-30 DIAGNOSIS — M1990 Unspecified osteoarthritis, unspecified site: Secondary | ICD-10-CM | POA: Diagnosis not present

## 2021-10-30 DIAGNOSIS — G8254 Quadriplegia, C5-C7 incomplete: Secondary | ICD-10-CM | POA: Diagnosis not present

## 2022-01-06 ENCOUNTER — Encounter: Payer: Self-pay | Admitting: Family Medicine

## 2022-01-06 ENCOUNTER — Ambulatory Visit (INDEPENDENT_AMBULATORY_CARE_PROVIDER_SITE_OTHER): Payer: Medicare PPO | Admitting: Family Medicine

## 2022-01-06 DIAGNOSIS — H6121 Impacted cerumen, right ear: Secondary | ICD-10-CM

## 2022-01-06 DIAGNOSIS — I10 Essential (primary) hypertension: Secondary | ICD-10-CM | POA: Diagnosis not present

## 2022-01-06 NOTE — Progress Notes (Signed)
Subjective:   By signing my name below, I, [name], attest that this documentation has been prepared under the direction and in the presence of Mosie Lukes, MD 01/06/2022.       Patient ID: [name], female    DOB: 1950/11/04, 71 y.o.   MRN: [id number]  Chief Complaint  Patient presents with   Ear Fullness    HPI Patient is in today for an office visit.  Clogged ear: She complains that her right ear is clogged. She requires ear irrigation.   Face tenderness: She complains of left sided face tenderness, specifically in the cheek region.   Allergies: She states that she does not take any medications to help manage her allergies.  Past Medical History:  Diagnosis Date   Acute bronchitis 05/16/2015   Anemia    after shoulder replacement   Autonomic dysreflexia    AVM (arteriovenous malformation) spine 1981   Bowel dysfunction    constipation and fecal retention   Cerumen impaction 2/70/6237   Complication of anesthesia    low temperature after general anesthesia for colostomy   Constipation 01/25/2016   Contusion 05/18/2014   Of foot   Depression 02/13/2017   History of    Headache 04/09/2017   History of kidney stones    History of neurogenic bowel    History of rotator cuff tear    Right   History of ventral hernia    Hyperglycemia 05/18/2015   Hyperlipidemia    Hypertension    No medications at this time   Incomplete quadriplegia at C5-6 level ([place]) 07/24/2008   Qualifier: Diagnosis of  By: Wynona Luna Requires bowel regimen including enemas and indwelling foley cath Has had tendency towards pressure sores in past. Had a tear with a transfer in [facility] in past for surgery and developed a sore at that time.    Low blood pressure reading    Occassionally   Medicare annual wellness visit, subsequent 11/02/2014   Follows with [facility] Declines colonoscopy  No h/o abnormal pap, declines further paps   MVP (mitral valve prolapse) 12/01/2012   Mild    Neurogenic bladder    OA (osteoarthritis)    Hip and shoulders, neck   Obesity 11/01/2016   OSA on CPAP 01/31/2016   Pain in joint, shoulder region 12/01/2012   Long history B/l Follows with Dr Hassell Done at [facility]   Postmenopausal bleeding 12/17/2012   Preventative health care 06/26/2013   Quadriplegia ([place]) 1981   Secondary to ruptured AVM C5-C6   Sacral pressure ulcer 12/01/2012   H/o Follows with Dr Tomi Likens of Dermatology   Skin cancer 11/01/2016   2 BCC RLE excised by Dr Wilhemina Bonito 2018 [place] LLE excised by Dr Wilhemina Bonito 2018   Vaginitis and vulvovaginitis 12/17/2012   Wears glasses     Past Surgical History:  Procedure Laterality Date   ANAL TAG REMOVAL  2014   BASAL CELL CARCINOMA EXCISION  2016   Lip   BASAL CELL CARCINOMA EXCISION  2018   right leg x2   BASAL CELL CARCINOMA EXCISION Left 2018   leg, chest   BASAL CELL CARCINOMA EXCISION Right 2019   leg   BLADDER STONE REMOVAL  1986/87   COLONOSCOPY  02/22/2016   COLOSTOMY  02/22/2016   CYSTOSCOPY WITH BIOPSY N/A 06/21/2018   Procedure: CYSTOSCOPY WITH BIOPSY FULGURATION;  Surgeon: Irine Seal, MD;  Location: [facility];  Service: Urology;  Laterality: N/A;   CYSTOSCOPY WITH LITHOLAPAXY N/A 06/21/2018   Procedure: CYSTOSCOPY WITH LITHOLAPAXY;  Surgeon: Irine Seal, MD;  Location: [facility];  Service: Urology;  Laterality: N/A;   DORSAL CHEILECTOMY-HALLUX RIGIDUS Bilateral 2011   HERNIA REPAIR  7371   VENTRAL umbilical hernia repair   LAMINECTOMY  10/23/1979   AVM   LAPAROSCOPIC SIGMOID COLECTOMY  02/22/2016   ROTATOR CUFF REPAIR  1996, 1997   left shoulder   ROTATOR CUFF REPAIR Right 2001   SHOULDER ARTHROSCOPY  2007   right   SQUAMOUS CELL CARCINOMA EXCISION Left 2018   leg   Toe Nails Removal Bilateral 1981   both great toes   TOTAL SHOULDER REPLACEMENT  2001   left   TRANSFER TENDON HAND  1982, 1983   to right hand X3   VENTRAL HERNIA REPAIR  08/17/2010   Postop  ileus (Dr. Redmond Pulling)    Family History  Problem Relation Age of Onset   Cancer Mother        breast & melanoma   Hypertension Mother    Heart disease Father        CAD, MI, AFib   Cancer Sister        melanoma   Cancer Maternal Grandfather        melanoma   Stroke Paternal Grandmother    Heart disease Paternal Grandfather    Cancer Maternal Aunt        breast   Cancer Paternal Aunt        breast   Cancer Paternal Uncle        breast   Cancer Paternal Aunt        breast    Social History   Socioeconomic History   Marital status: Single    Spouse name: Not on file   Number of children: Not on file   Years of education: Not on file   Highest education level: Not on file  Occupational History   Occupation: Disabled    Comment: Music therapist  Tobacco Use   Smoking status: Never   Smokeless tobacco: Never  Vaping Use   Vaping Use: Never used  Substance and Sexual Activity   Alcohol use: No   Drug use: No   Sexual activity: Not on file    Comment: lives with sister and mother, no major dietary restrictions  Other Topics Concern   Not on file  Social History Narrative   Sister is primary caregiver   Social Determinants of Health   Financial Resource Strain: Low Risk  (04/21/2021)   Overall Financial Resource Strain (CARDIA)    Difficulty of Paying Living Expenses: Not hard at all  Food Insecurity: No Food Insecurity (04/21/2021)   Hunger Vital Sign    Worried About Running Out of Food in the Last Year: Never true    [place] in the Last Year: Never true  Transportation Needs: No Transportation Needs (04/21/2021)   PRAPARE - Hydrologist (Medical): No    Lack of Transportation (Non-Medical): No  Physical Activity: Inactive (04/21/2021)   Exercise Vital Sign    Days of Exercise per Week: 0 days    Minutes of Exercise per Session: 0 min  Stress: No Stress Concern Present (04/21/2021)   [place]    Feeling of Stress : Not at all  Social Connections: Moderately Isolated (04/21/2021)   Social Connection and Isolation Panel [NHANES]  Frequency of Communication with Friends and Family: More than three times a week    Frequency of Social Gatherings with Friends and Family: More than three times a week    Attends Religious Services: More than 4 times per year    Active Member of [employer] or Organizations: No    Attends Archivist Meetings: Never    Marital Status: Never married  Intimate Partner Violence: Not At Risk (04/21/2021)   Humiliation, Afraid, Rape, and Kick questionnaire    Fear of Current or Ex-Partner: No    Emotionally Abused: No    Physically Abused: No    Sexually Abused: No    Outpatient Medications Prior to Visit  Medication Sig Dispense Refill   baclofen (LIORESAL) 20 MG tablet 1 tab po bid 180 each 1   clobetasol (TEMOVATE) 0.05 % external solution Apply 1 application topically every Monday, Wednesday, and Friday. Use on scalp 3 times per week     docusate sodium (COLACE) 100 MG capsule Take 1 capsule (100 mg total) by mouth daily. 90 capsule 2   ELDERBERRY PO Take 2 each by mouth daily.     FIBER PO Take 1 capsule by mouth daily.     hydrochlorothiazide (MICROZIDE) 12.5 MG capsule TAKE 1 CAPSULE BY MOUTH DAILY 90 capsule 1   influenza vaccine adjuvanted (FLUAD) 0.5 ML injection Inject into the muscle. 0.5 mL 0   lisinopril (ZESTRIL) 5 MG tablet TAKE 1 TABLET BY MOUTH DAILY 90 tablet 1   MELATONIN GUMMIES PO Take 5 mg by mouth at bedtime.     Multiple Vitamins-Calcium (ONE-A-DAY WOMENS FORMULA PO) Take 1 tablet by mouth daily.     OXYBUTYNIN CHLORIDE ER PO Take 10 mg by mouth at bedtime.     Probiotic Product (DIGESTIVE ADVANTAGE) CAPS Take 1 tablet by mouth daily.     rosuvastatin (CRESTOR) 5 MG tablet Take 1 tablet (5 mg total) by mouth daily. 90 tablet 3   No facility-administered medications prior to visit.     Allergies  Allergen Reactions   Adhesive [Tape] Other (See Comments)    Skin irritation Does not tolerate Convotech, tolerates Hollister    ROS     Objective:    Physical Exam Constitutional:      General: She is not in acute distress.    Appearance: Normal appearance. She is not ill-appearing.  HENT:     Head: Normocephalic and atraumatic.     Right Ear: Tympanic membrane, ear canal and external ear normal.     Left Ear: Tympanic membrane, ear canal and external ear normal.     Ears:     Comments: (+) Heavy wax in the right ear Eyes:     Extraocular Movements: Extraocular movements intact.     Pupils: Pupils are equal, round, and reactive to light.  Cardiovascular:     Rate and Rhythm: Normal rate and regular rhythm.     Pulses: Normal pulses.     Heart sounds: Normal heart sounds. No murmur heard.    No gallop.  Pulmonary:     Effort: Pulmonary effort is normal. No respiratory distress.     Breath sounds: Normal breath sounds. No wheezing or rales.  Skin:    General: Skin is warm and dry.  Neurological:     Mental Status: She is alert and oriented to person, place, and time.  Psychiatric:        Mood and Affect: Mood normal.        Behavior:  Behavior normal.        Judgment: Judgment normal.     BP 138/78 (BP Location: Left Arm, Patient Position: Sitting, Cuff Size: Normal)   Pulse 67   Resp 20   Ht '5\' 11"'$  (1.803 m)   Wt 220 lb (99.8 kg)   SpO2 98%   BMI 30.68 kg/m  Wt Readings from Last 3 Encounters:  01/06/22 220 lb (99.8 kg)  09/28/21 220 lb (99.8 kg)  04/21/21 220 lb (99.8 kg)    Diabetic Foot Exam - Simple   No data filed    Lab Results  Component Value Date   WBC 7.3 11/18/2019   HGB 12.9 11/18/2019   HCT 39.2 11/18/2019   PLT 253.0 11/18/2019   GLUCOSE 95 09/28/2021   CHOL 174 09/28/2021   TRIG 140.0 09/28/2021   HDL 44.40 09/28/2021   LDLDIRECT 125.0 06/25/2018   LDLCALC 101 (H) 09/28/2021   ALT 15 09/28/2021   AST 12  09/28/2021   NA 138 09/28/2021   K 4.4 09/28/2021   CL 99 09/28/2021   CREATININE 0.36 (L) 09/28/2021   BUN 7 09/28/2021   CO2 31 09/28/2021   TSH 2.15 11/18/2019   HGBA1C 6.5 09/28/2021    Lab Results  Component Value Date   TSH 2.15 11/18/2019   Lab Results  Component Value Date   WBC 7.3 11/18/2019   HGB 12.9 11/18/2019   HCT 39.2 11/18/2019   MCV 79.8 11/18/2019   PLT 253.0 11/18/2019   Lab Results  Component Value Date   NA 138 09/28/2021   K 4.4 09/28/2021   CO2 31 09/28/2021   GLUCOSE 95 09/28/2021   BUN 7 09/28/2021   CREATININE 0.36 (L) 09/28/2021   BILITOT 0.5 09/28/2021   ALKPHOS 90 09/28/2021   AST 12 09/28/2021   ALT 15 09/28/2021   PROT 7.4 09/28/2021   ALBUMIN 4.3 09/28/2021   CALCIUM 9.5 09/28/2021   ANIONGAP 6 06/21/2018   GFR 103.95 09/28/2021   Lab Results  Component Value Date   CHOL 174 09/28/2021   Lab Results  Component Value Date   HDL 44.40 09/28/2021   Lab Results  Component Value Date   LDLCALC 101 (H) 09/28/2021   Lab Results  Component Value Date   TRIG 140.0 09/28/2021   Lab Results  Component Value Date   CHOLHDL 4 09/28/2021   Lab Results  Component Value Date   HGBA1C 6.5 09/28/2021       Assessment & Plan:   Problem List Items Addressed This Visit     Essential hypertension    Well controlled, no changes to meds. Encouraged heart healthy diet such as the DASH diet and exercise as tolerated.       Cerumen impaction    Right ear. Attempted disimpaction with water syringe, some wax was removed but not all. Will use H2O2 qhs x 5 days and then return for flushing again      No orders of the defined types were placed in this encounter.  I, Penni Homans, MD, personally preformed the services described in this documentation.  All medical record entries made by the scribe were at my direction and in my presence.  I have reviewed the chart and discharge instructions (if applicable) and agree that the record  reflects my personal performance and is accurate and complete. 01/06/2022.  I,Mohammed Iqbal,acting as a scribe for Penni Homans, MD.,have documented all relevant documentation on the behalf of Penni Homans, MD,as directed by  Erline Levine  Charlett Blake, MD while in the presence of Penni Homans, MD.  Penni Homans, MD

## 2022-01-06 NOTE — Patient Instructions (Signed)
Ear Irrigation Ear irrigation is a procedure to wash dirt and wax out of your ear canal. This procedure is also called lavage. You may need ear irrigation if you are having trouble hearing because of a buildup of earwax. You may also have ear irrigation as part of the treatment for an ear infection. Getting wax and dirt out of your ear canal can help ear drops work better. Tell a health care provider about: Any allergies you have. All medicines you are taking, including vitamins, herbs, eye drops, creams, and over-the-counter medicines. Any problems you or family members have had with anesthetic medicines. Any blood disorders you have. Any surgeries you have had. This includes any ear surgeries. Any medical conditions you have. Whether you are pregnant or may be pregnant. What are the risks? Generally, this is a safe procedure. However, problems may occur, including: Infection. Pain. Hearing loss. Fluid and debris being pushed through the eardrum and into the middle ear. This can occur if there are holes in the eardrum. Ear irrigation failing to work. What happens before the procedure? You will talk with your provider about the procedure and plan. You may be given ear drops to put in your ear 15-20 minutes before irrigation. This helps loosen the wax. What happens during the procedure?  A syringe is filled with water or saline solution, which is made of salt and water. The syringe is gently inserted into the ear canal. The fluid is used to flush out wax and other debris. The procedure may vary among health care providers and hospitals. What can I expect after the procedure? After an ear irrigation, follow instructions given to you by your health care provider. Follow these instructions at home: Using ear irrigation kits Ear irrigation kits are available for use at home. Ask your health care provider if this is an option for you. In general, you should: Use a home irrigation kit only as  told by your health care provider. Read the package instructions carefully. Follow the directions for using the syringe. Use water that is room temperature. Do not do ear irrigation at home if you: Have diabetes. Diabetes increases the risk of infection. Have a hole or tear in your eardrum. Have tubes in your ears. Have had any ear surgery in the past. Have been told not to irrigate your ears. Cleaning your ears  Clean the outside of your ear with a soft washcloth daily. If told by your health care provider, use a few drops of baby oil, mineral oil, glycerin, hydrogen peroxide, or over-the-counter earwax softening drops. Do not use cotton swabs to clean your ears. These can push wax down into the ear canal. Do not put anything into your ears to try to remove wax. This includes ear candles. General instructions Take over-the-counter and prescription medicines only as told by your health care provider. If you were prescribed an antibiotic medicine, use it as told by your health care provider. Do not stop using the antibiotic even if your condition improves. Keep the ear clean and dry by following the instructions from your health care provider. Keep all follow-up visits. This is important. Visit your health care provider at least once a year to have your ears and hearing checked. Contact a health care provider if: Your hearing is not improving or is getting worse. You have pain or redness in your ear. You are dizzy. You have ringing in your ears. You have nausea or vomiting. You have fluid, blood, or pus coming out  of your ear. Summary Ear irrigation is a procedure to wash dirt and wax out of your ear canal. This procedure is also called lavage. To perform ear irrigation, ear drops may be put in your ear 15-20 minutes before irrigation. Water or saline solution will be used to flush out earwax and other debris. You may be able to irrigate your ears at home. Ask your health care provider  if this is an option for you. Follow your health care provider's instructions. Clean your ears with a soft cloth after irrigation. Do not use cotton swabs to clean your ears. These can push wax down into the ear canal. This information is not intended to replace advice given to you by your health care provider. Make sure you discuss any questions you have with your health care provider. Document Revised: 09/17/2019 Document Reviewed: 09/17/2019 Elsevier Patient Education  2023 Elsevier Inc.  

## 2022-01-10 NOTE — Assessment & Plan Note (Signed)
Well controlled, no changes to meds. Encouraged heart healthy diet such as the DASH diet and exercise as tolerated.  °

## 2022-01-10 NOTE — Assessment & Plan Note (Signed)
Right ear. Attempted disimpaction with water syringe, some wax was removed but not all. Will use H2O2 qhs x 5 days and then return for flushing again

## 2022-01-17 ENCOUNTER — Ambulatory Visit (INDEPENDENT_AMBULATORY_CARE_PROVIDER_SITE_OTHER): Payer: Medicare PPO | Admitting: Family

## 2022-01-17 VITALS — BP 132/61 | HR 61 | Temp 97.8°F | Resp 16

## 2022-01-17 DIAGNOSIS — H6120 Impacted cerumen, unspecified ear: Secondary | ICD-10-CM | POA: Diagnosis not present

## 2022-01-17 NOTE — Assessment & Plan Note (Signed)
Resolved. Monitor.  

## 2022-01-17 NOTE — Progress Notes (Signed)
Subjective:   By signing my name below, I, [name], attest that this documentation has been prepared under the direction and in the presence of Alexandra Alar, NP 01/17/2022    Patient ID: [name], female    DOB: Oct 04, 1950, 71 y.o.   MRN: [id number]  Chief Complaint  Patient presents with   Cerumen Impaction    Here for follow up    HPI Patient is in today for a office visit.   Right cerumen impaction- She has been flushing her right ear with hydrogen peroxide since her last visit and would like to have them re-evaluated.    Health Maintenance Due  Topic Date Due   DEXA SCAN  Never done   Fecal DNA (Cologuard)  05/23/2018   COVID-19 Vaccine (4 - Moderna series) 08/10/2020   INFLUENZA VACCINE  01/11/2022    Past Medical History:  Diagnosis Date   Acute bronchitis 05/16/2015   Anemia    after shoulder replacement   Autonomic dysreflexia    AVM (arteriovenous malformation) spine 1981   Bowel dysfunction    constipation and fecal retention   Cerumen impaction 2/83/1517   Complication of anesthesia    low temperature after general anesthesia for colostomy   Constipation 01/25/2016   Contusion 05/18/2014   Of foot   Depression 02/13/2017   History of    Headache 04/09/2017   History of kidney stones    History of neurogenic bowel    History of rotator cuff tear    Right   History of ventral hernia    Hyperglycemia 05/18/2015   Hyperlipidemia    Hypertension    No medications at this time   Incomplete quadriplegia at C5-6 level ([place]) 07/24/2008   Qualifier: Diagnosis of  By: Wynona Luna Requires bowel regimen including enemas and indwelling foley cath Has had tendency towards pressure sores in past. Had a tear with a transfer in [facility] in past for surgery and developed a sore at that time.    Low blood pressure reading    Occassionally   Medicare annual wellness visit, subsequent 11/02/2014   Follows with [facility] Declines colonoscopy  No  h/o abnormal pap, declines further paps   MVP (mitral valve prolapse) 12/01/2012   Mild   Neurogenic bladder    OA (osteoarthritis)    Hip and shoulders, neck   Obesity 11/01/2016   OSA on CPAP 01/31/2016   Pain in joint, shoulder region 12/01/2012   Long history B/l Follows with Dr Hassell Done at [facility]   Postmenopausal bleeding 12/17/2012   Preventative health care 06/26/2013   Quadriplegia ([place]) 1981   Secondary to ruptured AVM C5-C6   Sacral pressure ulcer 12/01/2012   H/o Follows with Dr Tomi Likens of Dermatology   Skin cancer 11/01/2016   2 BCC RLE excised by Dr Wilhemina Bonito 2018 [place] LLE excised by Dr Wilhemina Bonito 2018   Vaginitis and vulvovaginitis 12/17/2012   Wears glasses     Past Surgical History:  Procedure Laterality Date   ANAL TAG REMOVAL  2014   BASAL CELL CARCINOMA EXCISION  2016   Lip   BASAL CELL CARCINOMA EXCISION  2018   right leg x2   BASAL CELL CARCINOMA EXCISION Left 2018   leg, chest   BASAL CELL CARCINOMA EXCISION Right 2019   leg   BLADDER STONE REMOVAL  1986/87   COLONOSCOPY  02/22/2016   COLOSTOMY  02/22/2016   CYSTOSCOPY WITH BIOPSY N/A 06/21/2018  Procedure: CYSTOSCOPY WITH BIOPSY FULGURATION;  Surgeon: Irine Seal, MD;  Location: [facility];  Service: Urology;  Laterality: N/A;   CYSTOSCOPY WITH LITHOLAPAXY N/A 06/21/2018   Procedure: CYSTOSCOPY WITH LITHOLAPAXY;  Surgeon: Irine Seal, MD;  Location: [facility];  Service: Urology;  Laterality: N/A;   DORSAL CHEILECTOMY-HALLUX RIGIDUS Bilateral 2011   HERNIA REPAIR  6286   VENTRAL umbilical hernia repair   LAMINECTOMY  10/23/1979   AVM   LAPAROSCOPIC SIGMOID COLECTOMY  02/22/2016   ROTATOR CUFF REPAIR  1996, 1997   left shoulder   ROTATOR CUFF REPAIR Right 2001   SHOULDER ARTHROSCOPY  2007   right   SQUAMOUS CELL CARCINOMA EXCISION Left 2018   leg   Toe Nails Removal Bilateral 1981   both great toes   TOTAL SHOULDER REPLACEMENT  2001   left   TRANSFER  TENDON HAND  1982, 1983   to right hand X3   VENTRAL HERNIA REPAIR  08/17/2010   Postop ileus (Dr. Redmond Pulling)    Family History  Problem Relation Age of Onset   Cancer Mother        breast & melanoma   Hypertension Mother    Heart disease Father        CAD, MI, AFib   Cancer Sister        melanoma   Cancer Maternal Grandfather        melanoma   Stroke Paternal Grandmother    Heart disease Paternal Grandfather    Cancer Maternal Aunt        breast   Cancer Paternal Aunt        breast   Cancer Paternal Uncle        breast   Cancer Paternal Aunt        breast    Social History   Socioeconomic History   Marital status: Single    Spouse name: Not on file   Number of children: Not on file   Years of education: Not on file   Highest education level: Not on file  Occupational History   Occupation: Disabled    Comment: Music therapist  Tobacco Use   Smoking status: Never   Smokeless tobacco: Never  Vaping Use   Vaping Use: Never used  Substance and Sexual Activity   Alcohol use: No   Drug use: No   Sexual activity: Not on file    Comment: lives with sister and mother, no major dietary restrictions  Other Topics Concern   Not on file  Social History Narrative   Sister is primary caregiver   Social Determinants of Health   Financial Resource Strain: Low Risk  (04/21/2021)   Overall Financial Resource Strain (CARDIA)    Difficulty of Paying Living Expenses: Not hard at all  Food Insecurity: No Food Insecurity (04/21/2021)   Hunger Vital Sign    Worried About Running Out of Food in the Last Year: Never true    [place] in the Last Year: Never true  Transportation Needs: No Transportation Needs (04/21/2021)   PRAPARE - Hydrologist (Medical): No    Lack of Transportation (Non-Medical): No  Physical Activity: Inactive (04/21/2021)   Exercise Vital Sign    Days of Exercise per Week: 0 days    Minutes of Exercise per Session: 0 min   Stress: No Stress Concern Present (04/21/2021)   [place]    Feeling  of Stress : Not at all  Social Connections: Moderately Isolated (04/21/2021)   Social Connection and Isolation Panel [NHANES]    Frequency of Communication with Friends and Family: More than three times a week    Frequency of Social Gatherings with Friends and Family: More than three times a week    Attends Religious Services: More than 4 times per year    Active Member of [employer] or Organizations: No    Attends Archivist Meetings: Never    Marital Status: Never married  Intimate Partner Violence: Not At Risk (04/21/2021)   Humiliation, Afraid, Rape, and Kick questionnaire    Fear of Current or Ex-Partner: No    Emotionally Abused: No    Physically Abused: No    Sexually Abused: No    Outpatient Medications Prior to Visit  Medication Sig Dispense Refill   baclofen (LIORESAL) 20 MG tablet 1 tab po bid 180 each 1   clobetasol (TEMOVATE) 0.05 % external solution Apply 1 application topically every Monday, Wednesday, and Friday. Use on scalp 3 times per week     docusate sodium (COLACE) 100 MG capsule Take 1 capsule (100 mg total) by mouth daily. 90 capsule 2   ELDERBERRY PO Take 2 each by mouth daily.     FIBER PO Take 1 capsule by mouth daily.     hydrochlorothiazide (MICROZIDE) 12.5 MG capsule TAKE 1 CAPSULE BY MOUTH DAILY 90 capsule 1   influenza vaccine adjuvanted (FLUAD) 0.5 ML injection Inject into the muscle. 0.5 mL 0   lisinopril (ZESTRIL) 5 MG tablet TAKE 1 TABLET BY MOUTH DAILY 90 tablet 1   Multiple Vitamins-Calcium (ONE-A-DAY WOMENS FORMULA PO) Take 1 tablet by mouth daily.     OXYBUTYNIN CHLORIDE ER PO Take 10 mg by mouth at bedtime.     Probiotic Product (DIGESTIVE ADVANTAGE) CAPS Take 1 tablet by mouth daily.     rosuvastatin (CRESTOR) 5 MG tablet Take 1 tablet (5 mg total) by mouth daily. 90 tablet 3   MELATONIN GUMMIES PO Take  5 mg by mouth at bedtime.     No facility-administered medications prior to visit.    Allergies  Allergen Reactions   Adhesive [Tape] Other (See Comments)    Skin irritation Does not tolerate [employer], tolerates [place]    ROS    See HPI Objective:    Physical Exam Constitutional:      General: She is not in acute distress.    Appearance: Normal appearance. She is not ill-appearing.     Comments: Seated in wheelchair  HENT:     Head: Normocephalic and atraumatic.     Right Ear: Tympanic membrane, ear canal and external ear normal.     Left Ear: Tympanic membrane, ear canal and external ear normal.  Eyes:     Extraocular Movements: Extraocular movements intact.     Pupils: Pupils are equal, round, and reactive to light.  Cardiovascular:     Rate and Rhythm: Normal rate.  Pulmonary:     Effort: Pulmonary effort is normal.  Skin:    General: Skin is warm and dry.  Neurological:     Mental Status: She is alert and oriented to person, place, and time.  Psychiatric:        Judgment: Judgment normal.     BP 132/61 (BP Location: Left Arm, Patient Position: Sitting, Cuff Size: Large)   Pulse 61   Temp 97.8 F (36.6 C) (Oral)   Resp 16   SpO2 99%  Wt Readings from Last 3 Encounters:  01/06/22 220 lb (99.8 kg)  09/28/21 220 lb (99.8 kg)  04/21/21 220 lb (99.8 kg)       Assessment & Plan:   Problem List Items Addressed This Visit       Unprioritized   Cerumen impaction - Primary    Resolved.  Monitor.         No orders of the defined types were placed in this encounter.   I, Nance Pear, NP, personally preformed the services described in this documentation.  All medical record entries made by the scribe were at my direction and in my presence.  I have reviewed the chart and discharge instructions (if applicable) and agree that the record reflects my personal performance and is accurate and complete. 01/17/2022   I,[name],acting as a  scribe for Nance Pear, NP.,have documented all relevant documentation on the behalf of Nance Pear, NP,as directed by  Nance Pear, NP while in the presence of Nance Pear, NP.   Nance Pear, NP

## 2022-01-20 ENCOUNTER — Encounter: Payer: Self-pay | Admitting: Family Medicine

## 2022-01-21 DIAGNOSIS — M25519 Pain in unspecified shoulder: Secondary | ICD-10-CM | POA: Diagnosis not present

## 2022-01-21 DIAGNOSIS — G8254 Quadriplegia, C5-C7 incomplete: Secondary | ICD-10-CM | POA: Diagnosis not present

## 2022-01-21 DIAGNOSIS — M1990 Unspecified osteoarthritis, unspecified site: Secondary | ICD-10-CM | POA: Diagnosis not present

## 2022-01-27 ENCOUNTER — Encounter: Payer: Self-pay | Admitting: Internal Medicine

## 2022-01-30 NOTE — Progress Notes (Signed)
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 Machine is now old. She has limited use of her hands, but is able to manage mask and headgear.  01/31/22- 71 yoF never smoker followed for OSA, complicated by Mitral Prolapse, HTN, AVM Spine, Quadriplegia C 5-6(partial), Neurogenic Bowel/ Colostomy,  Alopecia, Anemia, Rhinitis, CPAP  auto 5-20   / [employer]       replacement ordered 01/25/21  [employer]ense11AutoSet Download compliance-    100%, AHI 0.7/ hr Body weight today 220 lbs Covid vax- Download reviewed.  Machine was replaced last year.  Doing well with no expressed concerns. She is making life work with her despite significant neurologic challenges.  ROS-see HPI   + = positive Constitutional:    weight loss, night sweats, fevers, chills, fatigue, lassitude. HEENT:    headaches, difficulty swallowing, tooth/dental problems, sore throat,       sneezing, itching, ear ache, nasal congestion, post nasal drip, snoring CV:    chest pain, orthopnea, PND, swelling in lower extremities, anasarca,                                   dizziness, palpitations Resp:   shortness of breath with exertion or at rest.                productive cough,   non-productive cough, coughing up of blood.              change in color of mucus.  wheezing.   Skin:    rash or lesions. GI:  No-   heartburn, indigestion, abdominal pain, nausea, vomiting, diarrhea,                 change in bowel habits, loss of appetite GU: dysuria, change in color of urine, no urgency or frequency.   flank  pain. MS:   joint pain, stiffness, decreased range of motion, back pain. Neuro-     nothing unusual Psych:  change in mood or affect.  depression or anxiety.   memory loss.  OBJ- Physical Exam General- Alert, Oriented, Affect-appropriate, Distress- none acute, + power wheelchair Skin- rash-none, lesions- none, excoriation- none Lymphadenopathy- none Head- atraumatic            Eyes- Gross vision intact, PERRLA, conjunctivae and secretions clear            Ears- Hearing, canals-normal            Nose- Clear, no-Septal dev, mucus, polyps, erosion, perforation             Throat- Mallampati II I-IV, mucosa clear , drainage- none, tonsils- atrophic Neck- flexible , trachea midline, no stridor , thyroid nl, carotid no bruit Chest - symmetrical excursion , unlabored           Heart/CV- RRR , no murmur , no gallop  , no rub, nl s1 s2                           -  JVD- none , edema- none, stasis changes- none, varices- none           Lung- clear to P&A, wheeze- none, cough- none , dullness-none, rub- none           Chest wall-  Abd-  Br/ Gen/ Rectal- Not done, not indicated Extrem-+hand contractures/ limited use Neuro- grossly intact to observation

## 2022-01-31 ENCOUNTER — Encounter: Payer: Self-pay | Admitting: Internal Medicine

## 2022-01-31 ENCOUNTER — Ambulatory Visit (INDEPENDENT_AMBULATORY_CARE_PROVIDER_SITE_OTHER): Payer: Medicare PPO | Admitting: Internal Medicine

## 2022-01-31 DIAGNOSIS — G8254 Quadriplegia, C5-C7 incomplete: Secondary | ICD-10-CM | POA: Diagnosis not present

## 2022-01-31 DIAGNOSIS — G4733 Obstructive sleep apnea (adult) (pediatric): Secondary | ICD-10-CM

## 2022-01-31 NOTE — Patient Instructions (Signed)
We can continue CPAP auto 5-20  Please call if we can help 

## 2022-02-01 ENCOUNTER — Telehealth: Payer: Self-pay

## 2022-02-01 NOTE — Telephone Encounter (Signed)
[employer] order form signed and faxed back to [phone]. Form sent for scanning.

## 2022-02-15 DIAGNOSIS — Z1231 Encounter for screening mammogram for malignant neoplasm of breast: Secondary | ICD-10-CM | POA: Diagnosis not present

## 2022-02-15 LAB — HM MAMMOGRAPHY

## 2022-02-16 ENCOUNTER — Encounter: Payer: Self-pay | Admitting: Internal Medicine

## 2022-02-16 NOTE — Assessment & Plan Note (Signed)
She indicates she is coping okay with significant long-term challenges.

## 2022-02-16 NOTE — Assessment & Plan Note (Signed)
Benefits from CPAP with good compliance and control Plan- continue auto 5-20 

## 2022-02-24 DIAGNOSIS — N6489 Other specified disorders of breast: Secondary | ICD-10-CM | POA: Diagnosis not present

## 2022-03-07 ENCOUNTER — Encounter: Payer: Self-pay | Admitting: Family Medicine

## 2022-03-22 DIAGNOSIS — H1045 Other chronic allergic conjunctivitis: Secondary | ICD-10-CM | POA: Diagnosis not present

## 2022-03-22 DIAGNOSIS — H04123 Dry eye syndrome of bilateral lacrimal glands: Secondary | ICD-10-CM | POA: Diagnosis not present

## 2022-03-22 DIAGNOSIS — H02834 Dermatochalasis of left upper eyelid: Secondary | ICD-10-CM | POA: Diagnosis not present

## 2022-03-22 DIAGNOSIS — H2513 Age-related nuclear cataract, bilateral: Secondary | ICD-10-CM | POA: Diagnosis not present

## 2022-03-22 DIAGNOSIS — H16143 Punctate keratitis, bilateral: Secondary | ICD-10-CM | POA: Diagnosis not present

## 2022-03-22 DIAGNOSIS — H43393 Other vitreous opacities, bilateral: Secondary | ICD-10-CM | POA: Diagnosis not present

## 2022-03-22 DIAGNOSIS — H02831 Dermatochalasis of right upper eyelid: Secondary | ICD-10-CM | POA: Diagnosis not present

## 2022-03-22 DIAGNOSIS — H02824 Cysts of left upper eyelid: Secondary | ICD-10-CM | POA: Diagnosis not present

## 2022-03-28 DIAGNOSIS — M1990 Unspecified osteoarthritis, unspecified site: Secondary | ICD-10-CM | POA: Diagnosis not present

## 2022-03-28 DIAGNOSIS — G8254 Quadriplegia, C5-C7 incomplete: Secondary | ICD-10-CM | POA: Diagnosis not present

## 2022-03-28 DIAGNOSIS — M25519 Pain in unspecified shoulder: Secondary | ICD-10-CM | POA: Diagnosis not present

## 2022-03-29 ENCOUNTER — Other Ambulatory Visit: Payer: Self-pay

## 2022-03-29 ENCOUNTER — Other Ambulatory Visit (INDEPENDENT_AMBULATORY_CARE_PROVIDER_SITE_OTHER): Payer: Medicare PPO

## 2022-03-29 DIAGNOSIS — E119 Type 2 diabetes mellitus without complications: Secondary | ICD-10-CM | POA: Diagnosis not present

## 2022-03-29 DIAGNOSIS — I10 Essential (primary) hypertension: Secondary | ICD-10-CM

## 2022-03-29 DIAGNOSIS — E785 Hyperlipidemia, unspecified: Secondary | ICD-10-CM

## 2022-03-29 LAB — BASIC METABOLIC PANEL
BUN: 6 mg/dL (ref 6–23)
CO2: 30 mEq/L (ref 19–32)
Calcium: 9.7 mg/dL (ref 8.4–10.5)
Chloride: 100 mEq/L (ref 96–112)
Creatinine, Ser: 0.37 mg/dL — ABNORMAL LOW (ref 0.40–1.20)
GFR: 102.91 mL/min (ref >60.00)
Glucose, Bld: 109 mg/dL — ABNORMAL HIGH (ref 70–99)
Potassium: 4.1 mEq/L (ref 3.5–5.1)
Sodium: 138 mEq/L (ref 135–145)

## 2022-03-29 LAB — CBC WITH DIFFERENTIAL/PLATELET
Basophils Absolute: 0.1 10*3/uL (ref 0.0–0.1)
Basophils Relative: 0.9 % (ref 0.0–3.0)
Eosinophils Absolute: 0.1 10*3/uL (ref 0.0–0.7)
Eosinophils Relative: 1.9 % (ref 0.0–5.0)
HCT: 41.7 % (ref 36.0–46.0)
Hemoglobin: 13.8 g/dL (ref 12.0–15.0)
Lymphocytes Relative: 36 % (ref 12.0–46.0)
Lymphs Abs: 2.4 10*3/uL (ref 0.7–4.0)
MCHC: 33.1 g/dL (ref 30.0–36.0)
MCV: 80.5 fl (ref 78.0–100.0)
Monocytes Absolute: 0.4 10*3/uL (ref 0.1–1.0)
Monocytes Relative: 6.7 % (ref 3.0–12.0)
Neutro Abs: 3.7 10*3/uL (ref 1.4–7.7)
Neutrophils Relative %: 54.5 % (ref 43.0–77.0)
Platelets: 235 10*3/uL (ref 150.0–400.0)
RBC: 5.18 Mil/uL — ABNORMAL HIGH (ref 3.87–5.11)
RDW: 14.7 % (ref 11.5–15.5)
WBC: 6.7 10*3/uL (ref 4.0–10.5)

## 2022-03-29 LAB — COMPREHENSIVE METABOLIC PANEL
ALT: 16 U/L (ref 0–35)
AST: 14 U/L (ref 0–37)
Albumin: 4.2 g/dL (ref 3.5–5.2)
Alkaline Phosphatase: 93 U/L (ref 39–117)
BUN: 6 mg/dL (ref 6–23)
CO2: 30 mEq/L (ref 19–32)
Calcium: 9.7 mg/dL (ref 8.4–10.5)
Chloride: 100 mEq/L (ref 96–112)
Creatinine, Ser: 0.37 mg/dL — ABNORMAL LOW (ref 0.40–1.20)
GFR: 102.91 mL/min (ref >60.00)
Glucose, Bld: 109 mg/dL — ABNORMAL HIGH (ref 70–99)
Potassium: 4.1 mEq/L (ref 3.5–5.1)
Sodium: 138 mEq/L (ref 135–145)
Total Bilirubin: 0.5 mg/dL (ref 0.2–1.2)
Total Protein: 7.2 g/dL (ref 6.0–8.3)

## 2022-03-29 LAB — LIPID PANEL
Cholesterol: 134 mg/dL (ref 0–200)
HDL: 41.2 mg/dL (ref >39.00)
LDL Cholesterol: 64 mg/dL (ref 0–99)
NonHDL: 92.37
Total CHOL/HDL Ratio: 3
Triglycerides: 140 mg/dL (ref 0.0–149.0)
VLDL: 28 mg/dL (ref 0.0–40.0)

## 2022-03-29 LAB — TSH: TSH: 4.22 u[IU]/mL (ref 0.35–5.50)

## 2022-03-29 LAB — HEMOGLOBIN A1C: Hgb A1c MFr Bld: 6.7 % — ABNORMAL HIGH (ref 4.6–6.5)

## 2022-04-07 ENCOUNTER — Other Ambulatory Visit: Payer: Self-pay | Admitting: Family Medicine

## 2022-04-08 ENCOUNTER — Other Ambulatory Visit: Payer: Medicare PPO

## 2022-04-25 ENCOUNTER — Ambulatory Visit: Payer: Medicare PPO

## 2022-04-25 ENCOUNTER — Ambulatory Visit (INDEPENDENT_AMBULATORY_CARE_PROVIDER_SITE_OTHER): Payer: Medicare PPO | Admitting: *Deleted

## 2022-04-25 DIAGNOSIS — Z0000 Encounter for general adult medical examination without abnormal findings: Secondary | ICD-10-CM | POA: Diagnosis not present

## 2022-04-25 NOTE — Progress Notes (Signed)
Subjective:   [name] is a 71 y.o. female who presents for Medicare Annual (Subsequent) preventive examination.  I connected with  Areatha Keas on 04/25/22 by a audio enabled telemedicine application and verified that I am speaking with the correct person using two identifiers.  Patient Location: Home  Provider Location: Office/Clinic  I discussed the limitations of evaluation and management by telemedicine. The patient expressed understanding and agreed to proceed.   Review of Systems    Defer to PCP Cardiac Risk Factors include: obesity (BMI >30kg/m2);sedentary lifestyle;hypertension;dyslipidemia;advanced age (>44mn, >>24women)     Objective:    There were no vitals filed for this visit. There is no height or weight on file to calculate BMI.     04/25/2022    1:44 PM 01/01/2020    5:47 AM 06/21/2018   11:31 AM 08/07/2017    2:47 PM 06/21/2016    4:12 PM 10/21/2014    3:12 PM 08/27/2014    2:43 PM  Advanced Directives  Does Patient Have a Medical Advance Directive? Yes Yes Yes Yes Yes Yes Yes  Type of AParamedicof [place] will  [place] will [place]Living will [place] will [place] will [place] Does patient want to make changes to medical advance directive? No - Patient declined No - Patient declined No - Patient declined   Yes - information given No - Patient declined  Copy of [place] Chart? Yes - validated most recent copy scanned in chart (See row information)  No - copy requested Yes No - copy requested Yes No - copy requested    Current Medications (verified) Outpatient Encounter Medications as of 04/25/2022  Medication Sig   baclofen (LIORESAL) 20 MG tablet 1 tab po bid   clobetasol (TEMOVATE) 0.05 % external solution Apply 1 application topically every Monday, Wednesday, and Friday. Use  on scalp 3 times per week   docusate sodium (COLACE) 100 MG capsule Take 1 capsule (100 mg total) by mouth daily.   ELDERBERRY PO Take 2 each by mouth daily.   FIBER PO Take 1 capsule by mouth daily.   hydrochlorothiazide (MICROZIDE) 12.5 MG capsule Take 1 capsule (12.5 mg total) by mouth daily.   influenza vaccine adjuvanted (FLUAD) 0.5 ML injection Inject into the muscle.   lisinopril (ZESTRIL) 5 MG tablet TAKE 1 TABLET BY MOUTH DAILY   Multiple Vitamins-Calcium (ONE-A-DAY WOMENS FORMULA PO) Take 1 tablet by mouth daily.   OXYBUTYNIN CHLORIDE ER PO Take 10 mg by mouth at bedtime.   Probiotic Product (DIGESTIVE ADVANTAGE) CAPS Take 1 tablet by mouth daily.   rosuvastatin (CRESTOR) 5 MG tablet Take 1 tablet (5 mg total) by mouth daily.   No facility-administered encounter medications on file as of 04/25/2022.    Allergies (verified) Adhesive [tape]   History: Past Medical History:  Diagnosis Date   Acute bronchitis 05/16/2015   Anemia    after shoulder replacement   Autonomic dysreflexia    AVM (arteriovenous malformation) spine 1981   Bowel dysfunction    constipation and fecal retention   Cerumen impaction 49/93/7169  Complication of anesthesia    low temperature after general anesthesia for colostomy   Constipation 01/25/2016   Contusion 05/18/2014   Of foot   Depression 02/13/2017   History of    Headache 04/09/2017   History of kidney stones    History of neurogenic  bowel    History of rotator cuff tear    Right   History of ventral hernia    Hyperglycemia 05/18/2015   Hyperlipidemia    Hypertension    No medications at this time   Incomplete quadriplegia at C5-6 level ([place]) 07/24/2008   Qualifier: Diagnosis of  By: Wynona Luna Requires bowel regimen including enemas and indwelling foley cath Has had tendency towards pressure sores in past. Had a tear with a transfer in [facility] in past for surgery and developed a sore at that time.    Low blood pressure reading     Occassionally   Medicare annual wellness visit, subsequent 11/02/2014   Follows with [facility] Declines colonoscopy  No h/o abnormal pap, declines further paps   MVP (mitral valve prolapse) 12/01/2012   Mild   Neurogenic bladder    OA (osteoarthritis)    Hip and shoulders, neck   Obesity 11/01/2016   OSA on CPAP 01/31/2016   Pain in joint, shoulder region 12/01/2012   Long history B/l Follow[facility] Hassell [facility]   Postmenopausal bleeding 12/17/2012   Preventative health care 06/26/2013   Quadriplegia ([place]) 1981   Secondary to ruptured AVM C5-C6   Sacral pressure ulcer 12/01/2012   H/o Follows with Dr Tomi Likens of Dermatology   Skin cancer 11/01/2016   2 BCC RLE excised by Dr Wilhemina Bonito 2018 [place] LLE excised by Dr Wilhemina Bonito 2018   Vaginitis and vulvovaginitis 12/17/2012   Wears glasses    Past Surgical History:  Procedure Laterality Date   ANAL TAG REMOVAL  2014   BASAL CELL CARCINOMA EXCISION  2016   Lip   BASAL CELL CARCINOMA EXCISION  2018   right leg x2   BASAL CELL CARCINOMA EXCISION Left 2018   leg, chest   BASAL CELL CARCINOMA EXCISION Right 2019   leg   BLADDER STONE REMOVAL  1986/87   COLONOSCOPY  02/22/2016   COLOSTOMY  02/22/2016   CYSTOSCOPY WITH BIOPSY N/A 06/21/2018   Procedure: CYSTOSCOPY WITH BIOPSY FULGURATION;  Surgeon: Irine Seal, MD;  Location: [facility];  Service: Urology;  Laterality: N/A;   CYSTOSCOPY WITH LITHOLAPAXY N/A 06/21/2018   Procedure: CYSTOSCOPY WITH LITHOLAPAXY;  Surgeon: Irine Seal, MD;  Location: [facility];  Service: Urology;  Laterality: N/A;   DORSAL CHEILECTOMY-HALLUX RIGIDUS Bilateral 2011   HERNIA REPAIR  8921   VENTRAL umbilical hernia repair   LAMINECTOMY  10/23/1979   AVM   LAPAROSCOPIC SIGMOID COLECTOMY  02/22/2016   ROTATOR CUFF REPAIR  1996, 1997   left shoulder   ROTATOR CUFF REPAIR Right 2001   SHOULDER ARTHROSCOPY  2007   right   SQUAMOUS CELL CARCINOMA EXCISION  Left 2018   leg   Toe Nails Removal Bilateral 1981   both great toes   TOTAL SHOULDER REPLACEMENT  2001   left   TRANSFER TENDON HAND  1982, 1983   to right hand X3   VENTRAL HERNIA REPAIR  08/17/2010   Postop ileus (Dr. Redmond Pulling)   Family History  Problem Relation Age of Onset   Cancer Mother        breast & melanoma   Hypertension Mother    Heart disease Father        CAD, MI, AFib   Cancer Sister        melanoma   Cancer Maternal Grandfather        melanoma   Stroke Paternal  Grandmother    Heart disease Paternal Grandfather    Cancer Maternal Aunt        breast   Cancer Paternal Aunt        breast   Cancer Paternal Uncle        breast   Cancer Paternal Aunt        breast   Social History   Socioeconomic History   Marital status: Single    Spouse name: Not on file   Number of children: Not on file   Years of education: Not on file   Highest education level: Not on file  Occupational History   Occupation: Disabled    Comment: Music therapist  Tobacco Use   Smoking status: Never   Smokeless tobacco: Never  Vaping Use   Vaping Use: Never used  Substance and Sexual Activity   Alcohol use: No   Drug use: No   Sexual activity: Not on file    Comment: lives with sister and mother, no major dietary restrictions  Other Topics Concern   Not on file  Social History Narrative   Sister is primary caregiver   Social Determinants of Health   Financial Resource Strain: Low Risk  (04/21/2021)   Overall Financial Resource Strain (CARDIA)    Difficulty of Paying Living Expenses: Not hard at all  Food Insecurity: No Food Insecurity (04/25/2022)   Hunger Vital Sign    Worried About Running Out of Food in the Last Year: Never true    [place] in the Last Year: Never true  Transportation Needs: No Transportation Needs (04/25/2022)   PRAPARE - Hydrologist (Medical): No    Lack of Transportation (Non-Medical): No  Physical Activity:  Inactive (04/21/2021)   Exercise Vital Sign    Days of Exercise per Week: 0 days    Minutes of Exercise per Session: 0 min  Stress: No Stress Concern Present (04/21/2021)   [place]    Feeling of Stress : Not at all  Social Connections: Moderately Isolated (04/21/2021)   Social Connection and Isolation Panel [NHANES]    Frequency of Communication with Friends and Family: More than three times a week    Frequency of Social Gatherings with Friends and Family: More than three times a week    Attends Religious Services: More than 4 times per year    Active Member of [employer] or Organizations: No    Attends Music therapist: Never    Marital Status: Never married    Tobacco Counseling Counseling given: Not Answered   Clinical Intake:  Pre-visit preparation completed: Yes  Pain : No/denies pain  Diabetes: No  How often do you need to have someone help you when you read instructions, pamphlets, or other written materials from your doctor or pharmacy?: 1 - Never   Activities of Daily Living    04/25/2022    1:53 PM  In your present state of health, do you have any difficulty performing the following activities:  Hearing? 0  Vision? 0  Difficulty concentrating or making decisions? 0  Walking or climbing stairs? 1  Dressing or bathing? 1  Doing errands, shopping? 1  Preparing Food and eating ? Y  Using the Toilet? Y  In the past six months, have you accidently leaked urine? N  Do you have problems with loss of bowel control? N  Managing your Medications? N  Managing your Finances? N  Housekeeping or managing your Housekeeping? Y    Patient Care Team: Mosie Lukes, MD as PCP - General (Family Medicine) Irine Seal, MD as Attending Physician (Urology) Clent Jacks, MD as Consulting Physician (Ophthalmology) Laurance Flatten, MD as Consulting Physician (Orthopedic Surgery) Cheryll Cockayne, MD as  Consulting Physician (Surgical Oncology) Ronald Lobo, MD as Consulting Physician (Gastroenterology) Danella Sensing, MD as Consulting Physician (Dermatology)  Indicate any recent Medical Services you may have received from other than [facility] providers in the past year (date may be approximate).     Assessment:   This is a routine wellness examination for [facility].  Hearing/Vision screen No results found.  Dietary issues and exercise activities discussed: Current Exercise Habits: The patient does not participate in regular exercise at present, Exercise limited by: Other - see comments (quadriplegic)   Goals Addressed   None    Depression Screen    04/25/2022    1:53 PM 01/06/2022   11:37 AM 04/21/2021    1:08 PM 06/21/2016    4:11 PM  PHQ 2/9 Scores  PHQ - 2 Score 0 0 0 0  PHQ- 9 Score  0      Fall Risk    04/25/2022    1:52 PM 04/21/2021    1:06 PM 06/21/2016    4:11 PM  [place] in the past year? 0 0 No  Number falls in past yr: 0 0   Injury with Fall? 0 0   Risk for fall due to : No Fall Risks    Follow up Falls evaluation completed Falls prevention discussed     [place]:  Any stairs in or around the home? Yes  If so, are there any without handrails? No  Home free of loose throw rugs in walkways, pet beds, electrical cords, etc? Yes  Adequate lighting in your home to reduce risk of falls? Yes   ASSISTIVE DEVICES UTILIZED TO PREVENT FALLS:  Life alert? No  Use of a cane, walker or w/c? Yes  Grab bars in the bathroom? Yes  Shower chair or bench in shower? Yes  Elevated toilet seat or a handicapped toilet? No   TIMED UP AND GO:  Was the test performed?  No, audio visit .   Cognitive Function:    06/21/2016    4:14 PM  MMSE - Mini Mental State Exam  Orientation to time 5  Orientation to Place 5  Registration 3  Attention/ Calculation 5  Recall 3  Language- name 2 objects 2  Language- repeat 1  Language- follow 3  step command 3  Language- read & follow direction 1  Write a sentence 1  Copy design 1  Total score 30        04/25/2022    2:00 PM  6CIT Screen  What Year? 0 points  What month? 3 points  What time? 0 points  Count back from 20 0 points  Months in reverse 0 points  Repeat phrase 0 points  Total Score 3 points    Immunizations Immunization History  Administered Date(s) Administered   Fluad Quad(high Dose 65+) 03/14/2019, 03/23/2021   H1N1 07/24/2008   Influenza Split 04/05/2011, 04/18/2012   Influenza Whole 06/03/2008, 03/31/2009, 04/01/2010   Influenza, High Dose Seasonal PF 02/19/2018   Influenza,inj,Quad PF,6+ Mos 03/20/2013, 03/03/2014, 05/18/2015, 04/20/2016, 04/06/2017   Moderna Sars-Covid-2 Vaccination 11/02/2019, 11/30/2019, 06/15/2020   Pneumococcal Conjugate-13 06/24/2013   Pneumococcal Polysaccharide-23 12/07/2005, 03/21/2019   Pneumococcal-Unspecified 12/07/2005, 06/24/2013  Td 06/03/2008   Tdap 03/06/2019   Zoster Recombinat (Shingrix) 07/13/2018, 03/06/2019   Zoster, Live 11/26/2012    TDAP status: Up to date  Flu Vaccine status: Due, Education has been provided regarding the importance of this vaccine. Advised may receive this vaccine at local pharmacy or Health Dept. Aware to provide a copy of the vaccination record if obtained from local pharmacy or Health Dept. Verbalized acceptance and understanding.  Pneumococcal vaccine status: Up to date  Covid-19 vaccine status: Information provided on how to obtain vaccines.   Qualifies for Shingles Vaccine? Yes   Zostavax completed Yes   Shingrix Completed?: Yes  Screening Tests Health Maintenance  Topic Date Due   DEXA SCAN  Never done   Fecal DNA (Cologuard)  05/23/2018   COVID-19 Vaccine (4 - Moderna series) 08/10/2020   INFLUENZA VACCINE  01/11/2022   Medicare Annual Wellness (AWV)  04/21/2022   MAMMOGRAM  02/16/2024   TETANUS/TDAP  03/05/2029   Pneumonia Vaccine 68+ Years old  Completed    Hepatitis C Screening  Completed   Zoster Vaccines- Shingrix  Completed   HPV VACCINES  Aged Out    Health Maintenance  Health Maintenance Due  Topic Date Due   DEXA SCAN  Never done   Fecal DNA (Cologuard)  05/23/2018   COVID-19 Vaccine (4 - Moderna series) 08/10/2020   INFLUENZA VACCINE  01/11/2022   Medicare Annual Wellness (AWV)  04/21/2022    Colorectal cancer screening: Type of screening: Colonoscopy. Completed 02/22/16. Repeat every 10 years  Mammogram status: Completed 9/5/213. Repeat every year  Lung Cancer Screening: (Low Dose CT Chest recommended if Age 70-80 years, 30 pack-year currently smoking OR have quit w/in 15years.) does not qualify.    Additional Screening:  Hepatitis C Screening: Completed 05/18/15  Vision Screening: Recommended annual ophthalmology exams for early detection of glaucoma and other disorders of the eye. Is the patient up to date with their annual eye exam?  Yes  Who is the provider or what is the name of the office in which the patient attends annual eye exams? Dr. Clent Jacks If pt is not established with a provider, would they like to be referred to a provider to establish care? No .   Dental Screening: Recommended annual dental exams for proper oral hygiene  Community Resource Referral / Chronic Care Management: CRR required this visit?  No   CCM required this visit?  No      Plan:     I have personally reviewed and noted the following in the patient's chart:   Medical and social history Use of alcohol, tobacco or illicit drugs  Current medications and supplements including opioid prescriptions. Patient is not currently taking opioid prescriptions. Functional ability and status Nutritional status Physical activity Advanced directives List of other physicians Hospitalizations, surgeries, and ER visits in previous 12 months Vitals Screenings to include cognitive, depression, and falls Referrals and appointments  In  addition, I have reviewed and discussed with patient certain preventive protocols, quality metrics, and best practice recommendations. A written personalized care plan for preventive services as well as general preventive health recommendations were provided to patient.   Due to this being a telephonic visit, the after visit summary with patients personalized plan was offered to patient via mail or my-chart.  Patient would like to access on my-chart.  Beatris Ship, [place]   04/25/2022   Nurse Notes: None

## 2022-04-25 NOTE — Patient Instructions (Signed)
[name] , Thank you for taking time to come for your Medicare Wellness Visit. I appreciate your ongoing commitment to your health goals. Please review the following plan we discussed and let me know if I can assist you in the future.   These are the goals we discussed:  Goals      Patient Stated     Maintain current health        This is a list of the screening recommended for you and due dates:  Health Maintenance  Topic Date Due   DEXA scan (bone density measurement)  Never done   Cologuard (Stool DNA test)  05/23/2018   COVID-19 Vaccine (4 - Moderna series) 08/10/2020   Flu Shot  01/11/2022   Medicare Annual Wellness Visit  04/26/2023   Mammogram  02/16/2024   Tetanus Vaccine  03/05/2029   Pneumonia Vaccine  Completed   Hepatitis C Screening: USPSTF Recommendation to screen - Ages 18-79 yo.  Completed   Zoster (Shingles) Vaccine  Completed   HPV Vaccine  Aged Out     Next appointment: Follow up in one year for your annual wellness visit.   Preventive Care 29 Years and Older, Female Preventive care refers to lifestyle choices and visits with your health care provider that can promote health and wellness. What does preventive care include? A yearly physical exam. This is also called an annual well check. Dental exams once or twice a year. Routine eye exams. Ask your health care provider how often you should have your eyes checked. Personal lifestyle choices, including: Daily care of your teeth and gums. Regular physical activity. Eating a healthy diet. Avoiding tobacco and drug use. Limiting alcohol use. Practicing safe sex. Taking low-dose aspirin every day. Taking vitamin and mineral supplements as recommended by your health care provider. What happens during an annual well check? The services and screenings done by your health care provider during your annual well check will depend on your age, overall health, lifestyle risk factors, and family history of  disease. Counseling  Your health care provider may ask you questions about your: Alcohol use. Tobacco use. Drug use. Emotional well-being. Home and relationship well-being. Sexual activity. Eating habits. History of falls. Memory and ability to understand (cognition). Work and work Statistician. Reproductive health. Screening  You may have the following tests or measurements: Height, weight, and BMI. Blood pressure. Lipid and cholesterol levels. These may be checked every 5 years, or more frequently if you are over 69 years old. Skin check. Lung cancer screening. You may have this screening every year starting at age 52 if you have a 30-pack-year history of smoking and currently smoke or have quit within the past 15 years. Fecal occult blood test (FOBT) of the stool. You may have this test every year starting at age 30. Flexible sigmoidoscopy or colonoscopy. You may have a sigmoidoscopy every 5 years or a colonoscopy every 10 years starting at age 67. Hepatitis C blood test. Hepatitis B blood test. Sexually transmitted disease (STD) testing. Diabetes screening. This is done by checking your blood sugar (glucose) after you have not eaten for a while (fasting). You may have this done every 1-3 years. Bone density scan. This is done to screen for osteoporosis. You may have this done starting at age 21. Mammogram. This may be done every 1-2 years. Talk to your health care provider about how often you should have regular mammograms. Talk with your health care provider about your test results, treatment options, and  if necessary, the need for more tests. Vaccines  Your health care provider may recommend certain vaccines, such as: Influenza vaccine. This is recommended every year. Tetanus, diphtheria, and acellular pertussis (Tdap, Td) vaccine. You may need a Td booster every 10 years. Zoster vaccine. You may need this after age 30. Pneumococcal 13-valent conjugate (PCV13) vaccine. One  dose is recommended after age 36. Pneumococcal polysaccharide (PPSV23) vaccine. One dose is recommended after age 79. Talk to your health care provider about which screenings and vaccines you need and how often you need them. This information is not intended to replace advice given to you by your health care provider. Make sure you discuss any questions you have with your health care provider. Document Released: 06/26/2015 Document Revised: 02/17/2016 Document Reviewed: 03/31/2015 Elsevier Interactive Patient Education  2017 [place] Prevention in the Home Falls can cause injuries. They can happen to people of all ages. There are many things you can do to make your home safe and to help prevent falls. What can I do on the outside of my home? Regularly fix the edges of walkways and driveways and fix any cracks. Remove anything that might make you trip as you walk through a door, such as a raised step or threshold. Trim any bushes or trees on the path to your home. Use bright outdoor lighting. Clear any walking paths of anything that might make someone trip, such as rocks or tools. Regularly check to see if handrails are loose or broken. Make sure that both sides of any steps have handrails. Any raised decks and porches should have guardrails on the edges. Have any leaves, snow, or ice cleared regularly. Use sand or salt on walking paths during winter. Clean up any spills in your garage right away. This includes oil or grease spills. What can I do in the bathroom? Use night lights. Install grab bars by the toilet and in the tub and shower. Do not use towel bars as grab bars. Use non-skid mats or decals in the tub or shower. If you need to sit down in the shower, use a plastic, non-slip stool. Keep the floor dry. Clean up any water that spills on the floor as soon as it happens. Remove soap buildup in the tub or shower regularly. Attach bath mats securely with double-sided  non-slip rug tape. Do not have throw rugs and other things on the floor that can make you trip. What can I do in the bedroom? Use night lights. Make sure that you have a light by your bed that is easy to reach. Do not use any sheets or blankets that are too big for your bed. They should not hang down onto the floor. Have a firm chair that has side arms. You can use this for support while you get dressed. Do not have throw rugs and other things on the floor that can make you trip. What can I do in the kitchen? Clean up any spills right away. Avoid walking on wet floors. Keep items that you use a lot in easy-to-reach places. If you need to reach something above you, use a strong step stool that has a grab bar. Keep electrical cords out of the way. Do not use floor polish or wax that makes floors slippery. If you must use wax, use non-skid floor wax. Do not have throw rugs and other things on the floor that can make you trip. What can I do with my stairs? Do not leave any  items on the stairs. Make sure that there are handrails on both sides of the stairs and use them. Fix handrails that are broken or loose. Make sure that handrails are as long as the stairways. Check any carpeting to make sure that it is firmly attached to the stairs. Fix any carpet that is loose or worn. Avoid having throw rugs at the top or bottom of the stairs. If you do have throw rugs, attach them to the floor with carpet tape. Make sure that you have a light switch at the top of the stairs and the bottom of the stairs. If you do not have them, ask someone to add them for you. What else can I do to help prevent falls? Wear shoes that: Do not have high heels. Have rubber bottoms. Are comfortable and fit you well. Are closed at the toe. Do not wear sandals. If you use a stepladder: Make sure that it is fully opened. Do not climb a closed stepladder. Make sure that both sides of the stepladder are locked into place. Ask  someone to hold it for you, if possible. Clearly mark and make sure that you can see: Any grab bars or handrails. First and last steps. Where the edge of each step is. Use tools that help you move around (mobility aids) if they are needed. These include: Canes. Walkers. Scooters. Crutches. Turn on the lights when you go into a dark area. Replace any light bulbs as soon as they burn out. Set up your furniture so you have a clear path. Avoid moving your furniture around. If any of your floors are uneven, fix them. If there are any pets around you, be aware of where they are. Review your medicines with your doctor. Some medicines can make you feel dizzy. This can increase your chance of falling. Ask your doctor what other things that you can do to help prevent falls. This information is not intended to replace advice given to you by your health care provider. Make sure you discuss any questions you have with your health care provider. Document Released: 03/26/2009 Document Revised: 11/05/2015 Document Reviewed: 07/04/2014 Elsevier Interactive Patient Education  2017 [employer].

## 2022-04-29 ENCOUNTER — Other Ambulatory Visit: Payer: Self-pay | Admitting: Family Medicine

## 2022-05-06 ENCOUNTER — Encounter: Payer: Self-pay | Admitting: Family Medicine

## 2022-05-09 ENCOUNTER — Other Ambulatory Visit: Payer: Self-pay

## 2022-05-09 MED ORDER — BACLOFEN 20 MG PO TABS: 20.0000 mg | ORAL_TABLET | Freq: Two times a day (BID) | ORAL | 0 refills | Status: DC

## 2022-05-09 NOTE — Telephone Encounter (Signed)
Pt called to follow up on Rx status. Advised that it had not been sent in yet and that a note would be sent back to look into this matter. Pt acknowledged understanding.

## 2022-06-21 DIAGNOSIS — R32 Unspecified urinary incontinence: Secondary | ICD-10-CM | POA: Diagnosis not present

## 2022-06-21 DIAGNOSIS — Z933 Colostomy status: Secondary | ICD-10-CM | POA: Diagnosis not present

## 2022-06-21 DIAGNOSIS — G8250 Quadriplegia, unspecified: Secondary | ICD-10-CM | POA: Diagnosis not present

## 2022-07-01 ENCOUNTER — Other Ambulatory Visit: Payer: Self-pay | Admitting: Family Medicine

## 2022-07-02 DIAGNOSIS — G4733 Obstructive sleep apnea (adult) (pediatric): Secondary | ICD-10-CM | POA: Diagnosis not present

## 2022-08-11 DIAGNOSIS — Z933 Colostomy status: Secondary | ICD-10-CM | POA: Diagnosis not present

## 2022-08-11 DIAGNOSIS — R32 Unspecified urinary incontinence: Secondary | ICD-10-CM | POA: Diagnosis not present

## 2022-08-11 DIAGNOSIS — G8250 Quadriplegia, unspecified: Secondary | ICD-10-CM | POA: Diagnosis not present

## 2022-08-31 DIAGNOSIS — R32 Unspecified urinary incontinence: Secondary | ICD-10-CM | POA: Diagnosis not present

## 2022-08-31 DIAGNOSIS — G8250 Quadriplegia, unspecified: Secondary | ICD-10-CM | POA: Diagnosis not present

## 2022-08-31 DIAGNOSIS — Z933 Colostomy status: Secondary | ICD-10-CM | POA: Diagnosis not present

## 2022-09-05 DIAGNOSIS — N210 Calculus in bladder: Secondary | ICD-10-CM | POA: Diagnosis not present

## 2022-09-05 DIAGNOSIS — N3020 Other chronic cystitis without hematuria: Secondary | ICD-10-CM | POA: Diagnosis not present

## 2022-09-05 DIAGNOSIS — N312 Flaccid neuropathic bladder, not elsewhere classified: Secondary | ICD-10-CM | POA: Diagnosis not present

## 2022-09-05 DIAGNOSIS — N310 Uninhibited neuropathic bladder, not elsewhere classified: Secondary | ICD-10-CM | POA: Diagnosis not present

## 2022-09-20 DIAGNOSIS — R32 Unspecified urinary incontinence: Secondary | ICD-10-CM | POA: Diagnosis not present

## 2022-09-20 DIAGNOSIS — Z933 Colostomy status: Secondary | ICD-10-CM | POA: Diagnosis not present

## 2022-09-20 DIAGNOSIS — G8250 Quadriplegia, unspecified: Secondary | ICD-10-CM | POA: Diagnosis not present

## 2022-09-24 DIAGNOSIS — Z933 Colostomy status: Secondary | ICD-10-CM | POA: Diagnosis not present

## 2022-09-24 DIAGNOSIS — R32 Unspecified urinary incontinence: Secondary | ICD-10-CM | POA: Diagnosis not present

## 2022-09-24 DIAGNOSIS — G8250 Quadriplegia, unspecified: Secondary | ICD-10-CM | POA: Diagnosis not present

## 2022-09-29 ENCOUNTER — Other Ambulatory Visit: Payer: Self-pay | Admitting: Family Medicine

## 2022-09-30 DIAGNOSIS — G4733 Obstructive sleep apnea (adult) (pediatric): Secondary | ICD-10-CM | POA: Diagnosis not present

## 2022-10-17 ENCOUNTER — Other Ambulatory Visit: Payer: Self-pay | Admitting: Family Medicine

## 2022-10-21 ENCOUNTER — Other Ambulatory Visit: Payer: Self-pay | Admitting: Medical

## 2022-10-21 ENCOUNTER — Telehealth: Payer: Self-pay | Admitting: Family Medicine

## 2022-10-21 NOTE — Telephone Encounter (Signed)
Prescription Request  10/21/2022  Is this a "Controlled Substance" medicine? No  LOV: Visit date not found  What is the name of the medication or equipment?  rosuvastatin (CRESTOR) 5 MG tablet   Have you contacted your pharmacy to request a refill? Yes   Which pharmacy would you like this sent to?  [facility] - [place], [place] - [address] [place] [address] Phone: [phone] Fax: [phone]    Patient notified that their request is being sent to the clinical staff for review and that they should receive a response within 2 business days.   Please advise at [facility] [phone]

## 2022-11-01 ENCOUNTER — Other Ambulatory Visit: Payer: Self-pay | Admitting: Family Medicine

## 2022-11-02 DIAGNOSIS — G8250 Quadriplegia, unspecified: Secondary | ICD-10-CM | POA: Diagnosis not present

## 2022-11-02 DIAGNOSIS — R32 Unspecified urinary incontinence: Secondary | ICD-10-CM | POA: Diagnosis not present

## 2022-11-02 DIAGNOSIS — Z933 Colostomy status: Secondary | ICD-10-CM | POA: Diagnosis not present

## 2022-11-09 DIAGNOSIS — G8250 Quadriplegia, unspecified: Secondary | ICD-10-CM | POA: Diagnosis not present

## 2022-11-09 DIAGNOSIS — Z933 Colostomy status: Secondary | ICD-10-CM | POA: Diagnosis not present

## 2022-11-09 DIAGNOSIS — R32 Unspecified urinary incontinence: Secondary | ICD-10-CM | POA: Diagnosis not present

## 2022-11-11 ENCOUNTER — Telehealth: Payer: Self-pay

## 2022-11-11 DIAGNOSIS — M1990 Unspecified osteoarthritis, unspecified site: Secondary | ICD-10-CM | POA: Diagnosis not present

## 2022-11-11 DIAGNOSIS — M25519 Pain in unspecified shoulder: Secondary | ICD-10-CM | POA: Diagnosis not present

## 2022-11-11 DIAGNOSIS — G8254 Quadriplegia, C5-C7 incomplete: Secondary | ICD-10-CM | POA: Diagnosis not present

## 2022-11-11 NOTE — Telephone Encounter (Signed)
Called [employer] [facility] for penny it was faxed over but Refaxed. To let us know if we need to refax it

## 2022-11-15 ENCOUNTER — Other Ambulatory Visit: Payer: Self-pay | Admitting: Family Medicine

## 2022-11-21 NOTE — Telephone Encounter (Signed)
Called spoke with penny and she stated they added some more orders after we had last faxed over the paperwork. Boyd Kerbs stated she will refax ,was advised we will get it filled out placed in provider bin to be signed.

## 2022-11-21 NOTE — Telephone Encounter (Signed)
Penny with [employer] called to confirm receipt of updated orders that they faxed over on 11/14/2022. She said there were some revisions added that may have been missed. Requested for it to be refaxed because I do not see that it was received. Boyd Kerbs will refax It

## 2022-12-01 NOTE — Telephone Encounter (Signed)
[employer] called to get an update on the status of the wheelchair repair. Stated we can call [name] at [phone].

## 2022-12-02 NOTE — Telephone Encounter (Signed)
Called [employer] back lvm for penny letting her know that I had faxed the paperwork  Back that had received and if it more forms needed to call us back.

## 2022-12-05 DIAGNOSIS — R32 Unspecified urinary incontinence: Secondary | ICD-10-CM | POA: Diagnosis not present

## 2022-12-05 DIAGNOSIS — Z933 Colostomy status: Secondary | ICD-10-CM | POA: Diagnosis not present

## 2022-12-05 DIAGNOSIS — G8250 Quadriplegia, unspecified: Secondary | ICD-10-CM | POA: Diagnosis not present

## 2022-12-07 NOTE — Telephone Encounter (Signed)
They have not received the fax and are requesting a refax.

## 2022-12-07 NOTE — Telephone Encounter (Signed)
Lvm to fax any new paperwork that is needed. We have faxed the other forms  Twice .

## 2022-12-14 DIAGNOSIS — L661 Lichen planopilaris: Secondary | ICD-10-CM | POA: Diagnosis not present

## 2022-12-14 DIAGNOSIS — Z85828 Personal history of other malignant neoplasm of skin: Secondary | ICD-10-CM | POA: Diagnosis not present

## 2022-12-14 DIAGNOSIS — L905 Scar conditions and fibrosis of skin: Secondary | ICD-10-CM | POA: Diagnosis not present

## 2022-12-14 DIAGNOSIS — D225 Melanocytic nevi of trunk: Secondary | ICD-10-CM | POA: Diagnosis not present

## 2022-12-14 DIAGNOSIS — L738 Other specified follicular disorders: Secondary | ICD-10-CM | POA: Diagnosis not present

## 2022-12-14 DIAGNOSIS — L821 Other seborrheic keratosis: Secondary | ICD-10-CM | POA: Diagnosis not present

## 2022-12-14 DIAGNOSIS — L570 Actinic keratosis: Secondary | ICD-10-CM | POA: Diagnosis not present

## 2022-12-14 DIAGNOSIS — L820 Inflamed seborrheic keratosis: Secondary | ICD-10-CM | POA: Diagnosis not present

## 2022-12-14 DIAGNOSIS — L720 Epidermal cyst: Secondary | ICD-10-CM | POA: Diagnosis not present

## 2022-12-20 DIAGNOSIS — Z933 Colostomy status: Secondary | ICD-10-CM | POA: Diagnosis not present

## 2022-12-20 DIAGNOSIS — G8250 Quadriplegia, unspecified: Secondary | ICD-10-CM | POA: Diagnosis not present

## 2022-12-20 DIAGNOSIS — R32 Unspecified urinary incontinence: Secondary | ICD-10-CM | POA: Diagnosis not present

## 2022-12-26 ENCOUNTER — Other Ambulatory Visit: Payer: Self-pay | Admitting: Family Medicine

## 2022-12-28 DIAGNOSIS — G8254 Quadriplegia, C5-C7 incomplete: Secondary | ICD-10-CM | POA: Diagnosis not present

## 2022-12-29 ENCOUNTER — Other Ambulatory Visit: Payer: Self-pay | Admitting: Family Medicine

## 2022-12-29 DIAGNOSIS — G4733 Obstructive sleep apnea (adult) (pediatric): Secondary | ICD-10-CM | POA: Diagnosis not present

## 2023-01-24 ENCOUNTER — Other Ambulatory Visit: Payer: Self-pay | Admitting: Family Medicine

## 2023-01-24 DIAGNOSIS — R32 Unspecified urinary incontinence: Secondary | ICD-10-CM | POA: Diagnosis not present

## 2023-01-24 DIAGNOSIS — G8250 Quadriplegia, unspecified: Secondary | ICD-10-CM | POA: Diagnosis not present

## 2023-01-24 DIAGNOSIS — Z933 Colostomy status: Secondary | ICD-10-CM | POA: Diagnosis not present

## 2023-02-02 ENCOUNTER — Other Ambulatory Visit: Payer: Self-pay | Admitting: Family Medicine

## 2023-02-03 ENCOUNTER — Telehealth: Payer: Self-pay | Admitting: Family Medicine

## 2023-02-03 NOTE — Telephone Encounter (Signed)
Rx was received yesterday and sent to PCP to be filled.

## 2023-02-03 NOTE — Telephone Encounter (Signed)
Prescription Request  02/03/2023  Is this a "Controlled Substance" medicine? No  LOV: Visit date not found  What is the name of the medication or equipment? baclofen (LIORESAL) 20 MG tablet  Have you contacted your pharmacy to request a refill? Yes   Which pharmacy would you like this sent to?  [facility] - [place], [place] - [address] [place] [address] Phone: [phone] Fax: [phone]    Patient notified that their request is being sent to the clinical staff for review and that they should receive a response within 2 business days.   Please advise at [facility] [phone]

## 2023-02-03 NOTE — Telephone Encounter (Signed)
Pt followed up on previous call about her medication. SHe said she is down to her last dose and she has appt scheduled for November. Please send refill to [facility]

## 2023-02-05 NOTE — Progress Notes (Unsigned)
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  Machine was replaced last year.  Doing well with no expressed concerns. She is making life work with her despite significant neurologic challenges.  02/06/23- 72 yoF never smoker followed for OSA, complicated by Mitral Prolapse, HTN, AVM Spine, Quadriplegia C 5-6(partial), Neurogenic Bowel/ Colostomy,  Alopecia, Anemia, Rhinitis, CPAP  auto 5-20   / [employer]       replacement ordered 01/25/21  AirSense11AutoSet Download compliance-    100%, AHI 0.8/hr Body weight today 225 lbs Download reviewed. Denies problems. Headgear occ slips, but her hands work well enough to manage it. Denies breathing problems or other concerns for today.  ROS-see HPI   + = positive Constitutional:    weight loss, night sweats, fevers, chills, fatigue, lassitude. HEENT:    headaches, difficulty swallowing, tooth/dental problems, sore throat,       sneezing, itching, ear ache, nasal congestion, post nasal drip, snoring CV:    chest pain, orthopnea, PND, swelling in lower extremities, anasarca,                                   dizziness, palpitations Resp:   shortness of breath with exertion or at rest.                productive cough,   non-productive cough, coughing up of blood.              change in color of mucus.  wheezing.   Skin:    rash or lesions. GI:  No-    heartburn, indigestion, abdominal pain, nausea, vomiting, diarrhea,                 change in bowel habits, loss of appetite GU: dysuria, change in color of urine, no urgency or frequency.   flank pain. MS:   joint pain, stiffness, decreased range of motion, back pain. Neuro-     nothing unusual Psych:  change in mood or affect.  depression or anxiety.   memory loss.  OBJ- Physical Exam General- Alert, Oriented, Affect-appropriate, Distress- none acute, + power wheelchair Skin- rash-none, lesions- none, excoriation- none Lymphadenopathy- none Head- atraumatic            Eyes- Gross vision intact, PERRLA, conjunctivae and secretions clear            Ears- Hearing, canals-normal            Nose- Clear, no-Septal dev, mucus, polyps, erosion, perforation             Throat- Mallampati II I-IV, mucosa clear , drainage- none, tonsils- atrophic Neck- flexible , trachea midline, no stridor , thyroid nl, carotid no bruit Chest - symmetrical excursion , unlabored           Heart/CV- RRR , no murmur , no gallop  , no rub, nl s1 s2                           -  JVD- none , edema- none, stasis changes- none, varices- none           Lung- clear to P&A, wheeze- none, cough- none , dullness-none, rub- none           Chest wall-  Abd-  Br/ Gen/ Rectal- Not done, not indicated Extrem-+hand contractures/ limited use/ +surgical scars Neuro- +paraplegic

## 2023-02-06 ENCOUNTER — Encounter: Payer: Self-pay | Admitting: Internal Medicine

## 2023-02-06 ENCOUNTER — Ambulatory Visit (INDEPENDENT_AMBULATORY_CARE_PROVIDER_SITE_OTHER): Payer: Medicare PPO | Admitting: Internal Medicine

## 2023-02-06 VITALS — BP 122/70 | HR 60 | Ht 71.5 in | Wt 225.0 lb

## 2023-02-06 DIAGNOSIS — G4733 Obstructive sleep apnea (adult) (pediatric): Secondary | ICD-10-CM | POA: Diagnosis not present

## 2023-02-06 DIAGNOSIS — G8254 Quadriplegia, C5-C7 incomplete: Secondary | ICD-10-CM

## 2023-02-06 NOTE — Patient Instructions (Signed)
We can continue CPAP auto 5-20  Please let us know if we can help.

## 2023-02-06 NOTE — Assessment & Plan Note (Signed)
She seems to be coping and denies new problems.

## 2023-02-06 NOTE — Assessment & Plan Note (Signed)
Benefits from CPAP with good compliance and control Plan- continue CPAP auto 5-20 

## 2023-02-14 ENCOUNTER — Encounter: Payer: Self-pay | Admitting: Internal Medicine

## 2023-02-27 ENCOUNTER — Other Ambulatory Visit: Payer: Self-pay | Admitting: Family Medicine

## 2023-02-27 ENCOUNTER — Telehealth: Payer: Self-pay | Admitting: Family Medicine

## 2023-02-27 NOTE — Telephone Encounter (Signed)
Prescription Request  02/27/2023  Is this a "Controlled Substance" medicine? No  LOV: Visit date not found  What is the name of the medication or equipment? lisinopril (ZESTRIL) 5 MG tablet   Have you contacted your pharmacy to request a refill? Yes   Which pharmacy would you like this sent to?  [facility] - [place], [place] - [address] [place] [address] Phone: [phone] Fax: [phone]    Patient notified that their request is being sent to the clinical staff for review and that they should receive a response within 2 business days.   Please advise at [facility] [phone]

## 2023-02-27 NOTE — Telephone Encounter (Signed)
Refill sent.

## 2023-03-01 DIAGNOSIS — N6320 Unspecified lump in the left breast, unspecified quadrant: Secondary | ICD-10-CM | POA: Diagnosis not present

## 2023-03-01 DIAGNOSIS — N6310 Unspecified lump in the right breast, unspecified quadrant: Secondary | ICD-10-CM | POA: Diagnosis not present

## 2023-03-02 ENCOUNTER — Encounter: Payer: Self-pay | Admitting: Family Medicine

## 2023-03-06 ENCOUNTER — Other Ambulatory Visit: Payer: Self-pay | Admitting: Radiology

## 2023-03-06 DIAGNOSIS — R928 Other abnormal and inconclusive findings on diagnostic imaging of breast: Secondary | ICD-10-CM | POA: Diagnosis not present

## 2023-03-06 DIAGNOSIS — N6011 Diffuse cystic mastopathy of right breast: Secondary | ICD-10-CM | POA: Diagnosis not present

## 2023-03-06 DIAGNOSIS — N6310 Unspecified lump in the right breast, unspecified quadrant: Secondary | ICD-10-CM | POA: Diagnosis not present

## 2023-03-06 DIAGNOSIS — N6021 Fibroadenosis of right breast: Secondary | ICD-10-CM | POA: Diagnosis not present

## 2023-03-07 LAB — SURGICAL PATHOLOGY

## 2023-03-08 ENCOUNTER — Encounter: Payer: Self-pay | Admitting: Family Medicine

## 2023-03-21 DIAGNOSIS — R32 Unspecified urinary incontinence: Secondary | ICD-10-CM | POA: Diagnosis not present

## 2023-03-21 DIAGNOSIS — G8250 Quadriplegia, unspecified: Secondary | ICD-10-CM | POA: Diagnosis not present

## 2023-03-21 DIAGNOSIS — Z933 Colostomy status: Secondary | ICD-10-CM | POA: Diagnosis not present

## 2023-03-23 ENCOUNTER — Other Ambulatory Visit: Payer: Self-pay | Admitting: Family Medicine

## 2023-03-28 DIAGNOSIS — H43393 Other vitreous opacities, bilateral: Secondary | ICD-10-CM | POA: Diagnosis not present

## 2023-03-28 DIAGNOSIS — H16143 Punctate keratitis, bilateral: Secondary | ICD-10-CM | POA: Diagnosis not present

## 2023-03-28 DIAGNOSIS — H02831 Dermatochalasis of right upper eyelid: Secondary | ICD-10-CM | POA: Diagnosis not present

## 2023-03-28 DIAGNOSIS — H04123 Dry eye syndrome of bilateral lacrimal glands: Secondary | ICD-10-CM | POA: Diagnosis not present

## 2023-03-28 DIAGNOSIS — H02834 Dermatochalasis of left upper eyelid: Secondary | ICD-10-CM | POA: Diagnosis not present

## 2023-03-28 DIAGNOSIS — H02824 Cysts of left upper eyelid: Secondary | ICD-10-CM | POA: Diagnosis not present

## 2023-03-28 DIAGNOSIS — H2513 Age-related nuclear cataract, bilateral: Secondary | ICD-10-CM | POA: Diagnosis not present

## 2023-03-28 DIAGNOSIS — H1045 Other chronic allergic conjunctivitis: Secondary | ICD-10-CM | POA: Diagnosis not present

## 2023-03-29 DIAGNOSIS — G4733 Obstructive sleep apnea (adult) (pediatric): Secondary | ICD-10-CM | POA: Diagnosis not present

## 2023-04-09 ENCOUNTER — Encounter: Payer: Self-pay | Admitting: Family Medicine

## 2023-04-17 NOTE — Assessment & Plan Note (Signed)
Well controlled, no changes to meds. Encouraged heart healthy diet such as the DASH diet and exercise as tolerated.  °

## 2023-04-17 NOTE — Assessment & Plan Note (Signed)
hgba1c acceptable, minimize simple carbs. Increase exercise as tolerated.  

## 2023-04-17 NOTE — Assessment & Plan Note (Signed)
Patient encouraged to maintain heart healthy diet, regular exercise, adequate sleep. Consider daily probiotics. Take medications as prescribed. Labs ordered and reviewed. Has ACP documents in chart [facility] 02/2022 repeat every 1-2 years Cologuard is overdue repeat ordered today Has aged out of pap Consider Dexa scan Consider Prevnar 20, Arexvy RSV vaccine, annual covid and flu boosters

## 2023-04-17 NOTE — Assessment & Plan Note (Signed)
Encouraged heart healthy diet, increase exercise, avoid trans fats, consider a krill oil cap daily 

## 2023-04-18 ENCOUNTER — Encounter: Payer: Self-pay | Admitting: Family Medicine

## 2023-04-18 ENCOUNTER — Ambulatory Visit (INDEPENDENT_AMBULATORY_CARE_PROVIDER_SITE_OTHER): Payer: Medicare PPO | Admitting: Family Medicine

## 2023-04-18 VITALS — BP 158/66 | HR 57 | Temp 97.7°F | Resp 18 | Ht 71.0 in

## 2023-04-18 DIAGNOSIS — Z780 Asymptomatic menopausal state: Secondary | ICD-10-CM

## 2023-04-18 DIAGNOSIS — E782 Mixed hyperlipidemia: Secondary | ICD-10-CM

## 2023-04-18 DIAGNOSIS — G8254 Quadriplegia, C5-C7 incomplete: Secondary | ICD-10-CM | POA: Diagnosis not present

## 2023-04-18 DIAGNOSIS — R739 Hyperglycemia, unspecified: Secondary | ICD-10-CM | POA: Diagnosis not present

## 2023-04-18 DIAGNOSIS — E2839 Other primary ovarian failure: Secondary | ICD-10-CM | POA: Diagnosis not present

## 2023-04-18 DIAGNOSIS — Z23 Encounter for immunization: Secondary | ICD-10-CM

## 2023-04-18 DIAGNOSIS — I10 Essential (primary) hypertension: Secondary | ICD-10-CM | POA: Diagnosis not present

## 2023-04-18 DIAGNOSIS — Z0000 Encounter for general adult medical examination without abnormal findings: Secondary | ICD-10-CM | POA: Diagnosis not present

## 2023-04-18 NOTE — Assessment & Plan Note (Signed)
Patient is in need of a new power wheelchair. She is working with [employer], Julianne Rice fax  [phone]. They need an order for a power wheelchair evaluation which we will provide. Her current wheelchair has been in use since April 2017. [name] will require special seating, cushioning, and positioning to provide trunk support and to relieve pressure and preven pressure sores. She has limited use of her upper extremities which means she is unable to boost herself up in her chair or shift her position to relieve pressure and prevent sores. The power tilt, power recline and power elevating seat and power elevating leg rest are some essential adjustable features she requires on her power wheelchair to help her maintain healthy skin and circulation. Her power wheelchair is essential for her to perform all of her activities of daily living such as grooming, bathing, toileting and eating.

## 2023-04-18 NOTE — Patient Instructions (Addendum)
RSV, Respiratory Syncitial Virus Vaccine, Arexvy Vaccine at pharmacy  Covid booster annually   Preventive Care 80 Years and Older, Female Preventive care refers to lifestyle choices and visits with your health care provider that can promote health and wellness. Preventive care visits are also called wellness exams. What can I expect for my preventive care visit? Counseling Your health care provider may ask you questions about your: Medical history, including: Past medical problems. Family medical history. Pregnancy and menstrual history. History of falls. Current health, including: Memory and ability to understand (cognition). Emotional well-being. Home life and relationship well-being. Sexual activity and sexual health. Lifestyle, including: Alcohol, nicotine or tobacco, and drug use. Access to firearms. Diet, exercise, and sleep habits. Work and work Astronomer. Sunscreen use. Safety issues such as seatbelt and bike helmet use. Physical exam Your health care provider will check your: Height and weight. These may be used to calculate your BMI (body mass index). BMI is a measurement that tells if you are at a healthy weight. Waist circumference. This measures the distance around your waistline. This measurement also tells if you are at a healthy weight and may help predict your risk of certain diseases, such as type 2 diabetes and high blood pressure. Heart rate and blood pressure. Body temperature. Skin for abnormal spots. What immunizations do I need?  Vaccines are usually given at various ages, according to a schedule. Your health care provider will recommend vaccines for you based on your age, medical history, and lifestyle or other factors, such as travel or where you work. What tests do I need? Screening Your health care provider may recommend screening tests for certain conditions. This may include: Lipid and cholesterol levels. Hepatitis C test. Hepatitis B  test. HIV (human immunodeficiency virus) test. STI (sexually transmitted infection) testing, if you are at risk. Lung cancer screening. Colorectal cancer screening. Diabetes screening. This is done by checking your blood sugar (glucose) after you have not eaten for a while (fasting). Mammogram. Talk with your health care provider about how often you should have regular mammograms. BRCA-related cancer screening. This may be done if you have a family history of breast, ovarian, tubal, or peritoneal cancers. Bone density scan. This is done to screen for osteoporosis. Talk with your health care provider about your test results, treatment options, and if necessary, the need for more tests. Follow these instructions at home: Eating and drinking  Eat a diet that includes fresh fruits and vegetables, whole grains, lean protein, and low-fat dairy products. Limit your intake of foods with high amounts of sugar, saturated fats, and salt. Take vitamin and mineral supplements as recommended by your health care provider. Do not drink alcohol if your health care provider tells you not to drink. If you drink alcohol: Limit how much you have to 0-1 drink a day. Know how much alcohol is in your drink. In the [place] one drink equals one 12 oz bottle of beer (355 mL), one 5 oz glass of wine (148 mL), or one 1 oz glass of hard liquor (44 mL). Lifestyle Brush your teeth every morning and night with fluoride toothpaste. Floss one time each day. Exercise for at least 30 minutes 5 or more days each week. Do not use any products that contain nicotine or tobacco. These products include cigarettes, chewing tobacco, and vaping devices, such as e-cigarettes. If you need help quitting, ask your health care provider. Do not use drugs. If you are sexually active, practice safe sex. Use a condom or  other form of protection in order to prevent STIs. Take aspirin only as told by your health care provider. Make sure that you  understand how much to take and what form to take. Work with your health care provider to find out whether it is safe and beneficial for you to take aspirin daily. Ask your health care provider if you need to take a cholesterol-lowering medicine (statin). Find healthy ways to manage stress, such as: Meditation, yoga, or listening to music. Journaling. Talking to a trusted person. Spending time with friends and family. Minimize exposure to UV radiation to reduce your risk of skin cancer. Safety Always wear your seat belt while driving or riding in a vehicle. Do not drive: If you have been drinking alcohol. Do not ride with someone who has been drinking. When you are tired or distracted. While texting. If you have been using any mind-altering substances or drugs. Wear a helmet and other protective equipment during sports activities. If you have firearms in your house, make sure you follow all gun safety procedures. What's next? Visit your health care provider once a year for an annual wellness visit. Ask your health care provider how often you should have your eyes and teeth checked. Stay up to date on all vaccines. This information is not intended to replace advice given to you by your health care provider. Make sure you discuss any questions you have with your health care provider. Document Revised: 11/25/2020 Document Reviewed: 11/25/2020 Elsevier Patient Education  2024 [employer].

## 2023-04-18 NOTE — Progress Notes (Signed)
Subjective:    Patient ID: [name], female    DOB: [birth year], 72 y.o.   MRN: [id number]  Chief Complaint  Patient presents with  . Annual Exam    HPI Discussed the use of AI scribe software for clinical note transcription with the patient, who gave verbal consent to proceed.  History of Present Illness   The patient, who is wheelchair-bound with a history of colostomy, presents for a routine check-up. The patient reports no significant changes in her health status since the last visit. The patient mentions a persistent issue with a toe deformity, second toe on right foot, which occasionally causes discomfort, particularly at night or when the toe rubs against shoes or other surfaces. The patient has been managing the discomfort with topical treatments and has been considering surgical correction but has been delaying due to the inconvenience and potential risks associated with surgery. The patient also discusses the need for a new wheelchair, indicating that the current one is not meeting her needs, particularly in terms of performing self-care tasks such as changing her colostomy bag. The patient's sister, who is present during the visit, provides additional information about her family history and assists in managing the patient's care.        Past Medical History:  Diagnosis Date  . Acute bronchitis 05/16/2015  . Anemia    after shoulder replacement  . Autonomic dysreflexia   . AVM (arteriovenous malformation) spine 1981  . Bowel dysfunction    constipation and fecal retention  . Cerumen impaction 10/11/2015  . Complication of anesthesia    low temperature after general anesthesia for colostomy  . Constipation 01/25/2016  . Contusion 05/18/2014   Of foot  . Depression 02/13/2017   History of   . Headache 04/09/2017  . History of kidney stones   . History of neurogenic bowel   . History of rotator cuff tear    Right  . History of ventral hernia   . Hyperglycemia 05/18/2015   . Hyperlipidemia   . Hypertension    No medications at this time  . Incomplete quadriplegia at C5-6 level (HCC) 07/24/2008   Qualifier: Diagnosis of  By: Nena Jordan Requires bowel regimen including enemas and indwelling foley cath Has had tendency towards pressure sores in past. Had a tear with a transfer in Hospital in past for surgery and developed a sore at that time.   . Low blood pressure reading    Occassionally  . Medicare annual wellness visit, subsequent 11/02/2014   Follows with [facility] Declines colonoscopy  No h/o abnormal pap, declines further paps  . MVP (mitral valve prolapse) 12/01/2012   Mild  . Neurogenic bladder   . OA (osteoarthritis)    Hip and shoulders, neck  . Obesity 11/01/2016  . OSA on CPAP 01/31/2016  . Pain in joint, shoulder region 12/01/2012   Long history B/l Follows with Dr Daphine Deutscher at [facility]  . Postmenopausal bleeding 12/17/2012  . Preventative health care 06/26/2013  . Quadriplegia (HCC) 1981   Secondary to ruptured AVM C5-C6  . Sacral pressure ulcer 12/01/2012   H/o Follows with Dr Duane Lope of Dermatology  . Skin cancer 11/01/2016   2 BCC RLE excised by Dr Karlyn Agee 2018 1SCC LLE excised by Dr Karlyn Agee 2018  . Vaginitis and vulvovaginitis 12/17/2012  . Wears glasses     Past Surgical History:  Procedure Laterality Date  . ANAL TAG REMOVAL  2014  .  BASAL CELL CARCINOMA EXCISION  2016   Lip  . BASAL CELL CARCINOMA EXCISION  2018   right leg x2  . BASAL CELL CARCINOMA EXCISION Left 2018   leg, chest  . BASAL CELL CARCINOMA EXCISION Right 2019   leg  . BLADDER STONE REMOVAL  1986/87  . COLONOSCOPY  02/22/2016  . COLOSTOMY  02/22/2016  . CYSTOSCOPY WITH BIOPSY N/A 06/21/2018   Procedure: CYSTOSCOPY WITH BIOPSY FULGURATION;  Surgeon: Bjorn Pippin, MD;  Location: [facility]  Service: Urology;  Laterality: N/A;  . CYSTOSCOPY WITH LITHOLAPAXY N/A 06/21/2018   Procedure: CYSTOSCOPY WITH LITHOLAPAXY;  Surgeon:  Bjorn Pippin, MD;  Location: [facility];  Service: Urology;  Laterality: N/A;  . DORSAL CHEILECTOMY-HALLUX RIGIDUS Bilateral 2011  . HERNIA REPAIR  2012   VENTRAL umbilical hernia repair  . LAMINECTOMY  10/23/1979   AVM  . LAPAROSCOPIC SIGMOID COLECTOMY  02/22/2016  . ROTATOR CUFF REPAIR  1996, 1997   left shoulder  . ROTATOR CUFF REPAIR Right 2001  . SHOULDER ARTHROSCOPY  2007   right  . SQUAMOUS CELL CARCINOMA EXCISION Left 2018   leg  . Toe Nails Removal Bilateral 1981   both great toes  . TOTAL SHOULDER REPLACEMENT  2001   left  . TRANSFER TENDON HAND  1982, 1983   to right hand X3  . VENTRAL HERNIA REPAIR  08/17/2010   Postop ileus (Dr. Andrey Campanile)    Family History  Problem Relation Age of Onset  . Cancer Mother        breast & melanoma  . Hypertension Mother   . Heart disease Father        CAD, MI, AFib  . Cancer Sister        melanoma  . Dementia Sister        Alzheimer's  . Cancer Maternal Aunt        breast  . Cancer Paternal Aunt        breast  . Cancer Paternal Aunt        breast  . Cancer Paternal Uncle        breast  . Cancer Maternal Grandfather        melanoma  . Stroke Paternal Grandmother   . Heart disease Paternal Grandfather     Social History   Socioeconomic History  . Marital status: Single    Spouse name: Not on file  . Number of children: Not on file  . Years of education: Not on file  . Highest education level: Not on file  Occupational History  . Occupation: Disabled    Comment: Editor, commissioning  Tobacco Use  . Smoking status: Never  . Smokeless tobacco: Never  Vaping Use  . Vaping status: Never Used  Substance and Sexual Activity  . Alcohol use: No  . Drug use: No  . Sexual activity: Not on file    Comment: lives with sister and mother, no major dietary restrictions  Other Topics Concern  . Not on file  Social History Narrative   Sister is primary caregiver   Social Determinants of Health   Financial  Resource Strain: Low Risk  (04/21/2021)   Overall Financial Resource Strain (CARDIA)   . Difficulty of Paying Living Expenses: Not hard at all  Food Insecurity: No Food Insecurity (04/25/2022)   Hunger Vital Sign   . Worried About Programme researcher, broadcasting/film/video in the Last Year: Never true   . Ran Out of Food in the Last  Year: Never true  Transportation Needs: No Transportation Needs (04/25/2022)   PRAPARE - Transportation   . Lack of Transportation (Medical): No   . Lack of Transportation (Non-Medical): No  Physical Activity: Inactive (04/21/2021)   Exercise Vital Sign   . Days of Exercise per Week: 0 days   . Minutes of Exercise per Session: 0 min  Stress: No Stress Concern Present (04/21/2021)   [employer] of Occupational Health - Occupational Stress Questionnaire   . Feeling of Stress : Not at all  Social Connections: Moderately Isolated (04/21/2021)   Social Connection and Isolation Panel [NHANES]   . Frequency of Communication with Friends and Family: More than three times a week   . Frequency of Social Gatherings with Friends and Family: More than three times a week   . Attends Religious Services: More than 4 times per year   . Active Member of Clubs or Organizations: No   . Attends Banker Meetings: Never   . Marital Status: Never married  Intimate Partner Violence: Not At Risk (04/21/2021)   Humiliation, Afraid, Rape, and Kick questionnaire   . Fear of Current or Ex-Partner: No   . Emotionally Abused: No   . Physically Abused: No   . Sexually Abused: No    Outpatient Medications Prior to Visit  Medication Sig Dispense Refill  . baclofen (LIORESAL) 20 MG tablet TAKE 1 TABLET BY MOUTH TWICE DAILY 180 tablet 0  . clobetasol (TEMOVATE) 0.05 % external solution Apply 1 application topically every Monday, Wednesday, and Friday. Use on scalp 3 times per week    . docusate sodium (COLACE) 100 MG capsule Take 1 capsule (100 mg total) by mouth daily. 90 capsule 2  .  ELDERBERRY PO Take 2 each by mouth daily.    Marland Kitchen FIBER PO Take 1 capsule by mouth daily.    . hydrochlorothiazide (MICROZIDE) 12.5 MG capsule Take 1 capsule (12.5 mg total) by mouth daily. 90 capsule 1  . influenza vaccine adjuvanted (FLUAD) 0.5 ML injection Inject into the muscle. 0.5 mL 0  . lisinopril (ZESTRIL) 5 MG tablet TAKE 1 TABLET BY MOUTH DAILY 30 tablet 0  . Multiple Vitamins-Calcium (ONE-A-DAY WOMENS FORMULA PO) Take 1 tablet by mouth daily.    . OXYBUTYNIN CHLORIDE ER PO Take 10 mg by mouth at bedtime.    . Probiotic Product (DIGESTIVE ADVANTAGE) CAPS Take 1 tablet by mouth daily.    . rosuvastatin (CRESTOR) 5 MG tablet TAKE 1 TABLET BY MOUTH DAILY 90 tablet 3   No facility-administered medications prior to visit.    Allergies  Allergen Reactions  . Adhesive [Tape] Other (See Comments)    Skin irritation Does not tolerate [employer], tolerates Hollister    Review of Systems  Constitutional:  Negative for chills, fever and malaise/fatigue.  HENT:  Negative for congestion and hearing loss.   Eyes:  Negative for discharge.  Respiratory:  Negative for cough, sputum production and shortness of breath.   Cardiovascular:  Negative for chest pain, palpitations and leg swelling.  Gastrointestinal:  Negative for abdominal pain, blood in stool, constipation, diarrhea, heartburn, nausea and vomiting.  Genitourinary:  Negative for dysuria, frequency, hematuria and urgency.  Musculoskeletal:  Negative for back pain, falls and myalgias.  Skin:  Negative for rash.  Neurological:  Positive for focal weakness. Negative for dizziness, sensory change, loss of consciousness, weakness and headaches.  Endo/Heme/Allergies:  Negative for environmental allergies. Does not bruise/bleed easily.  Psychiatric/Behavioral:  Negative for depression and suicidal ideas. The  patient is not nervous/anxious and does not have insomnia.       Objective:    Physical Exam Constitutional:      General: She is  not in acute distress.    Appearance: Normal appearance. She is not diaphoretic.     Comments: In power wheelchair  HENT:     Head: Normocephalic and atraumatic.     Right Ear: Tympanic membrane, ear canal and external ear normal.     Left Ear: Tympanic membrane, ear canal and external ear normal.     Nose: Nose normal.     Mouth/Throat:     Mouth: Mucous membranes are moist.     Pharynx: Oropharynx is clear. No oropharyngeal exudate.  Eyes:     General: No scleral icterus.       Right eye: No discharge.        Left eye: No discharge.     Conjunctiva/sclera: Conjunctivae normal.     Pupils: Pupils are equal, round, and reactive to light.  Neck:     Thyroid: No thyromegaly.  Cardiovascular:     Rate and Rhythm: Normal rate and regular rhythm.     Heart sounds: Normal heart sounds. No murmur heard. Pulmonary:     Effort: Pulmonary effort is normal. No respiratory distress.     Breath sounds: Normal breath sounds. No wheezing or rales.  Abdominal:     General: Bowel sounds are normal. There is no distension.     Palpations: Abdomen is soft. There is no mass.     Tenderness: There is no abdominal tenderness.  Musculoskeletal:        General: No tenderness. Normal range of motion.     Cervical back: Normal range of motion and neck supple.  Lymphadenopathy:     Cervical: No cervical adenopathy.  Skin:    General: Skin is warm and dry.     Findings: No rash.  Neurological:     General: No focal deficit present.     Mental Status: She is alert and oriented to person, place, and time.     Cranial Nerves: No cranial nerve deficit.     Coordination: Coordination normal.     Deep Tendon Reflexes: Reflexes are normal and symmetric. Reflexes normal.  Psychiatric:        Mood and Affect: Mood normal.        Behavior: Behavior normal.        Thought Content: Thought content normal.        Judgment: Judgment normal.   BP (!) 158/66 (BP Location: Right Arm, Patient Position: Sitting,  Cuff Size: Large)   Pulse (!) 57   Temp 97.7 F (36.5 C) (Oral)   Resp 18   Ht 5\' 11"  (1.803 m)   SpO2 100%   BMI 31.38 kg/m  Wt Readings from Last 3 Encounters:  02/06/23 225 lb (102.1 kg)  01/31/22 220 lb (99.8 kg)  01/06/22 220 lb (99.8 kg)    Diabetic Foot Exam - Simple   No data filed    Lab Results  Component Value Date   WBC 6.7 03/29/2022   HGB 13.8 03/29/2022   HCT 41.7 03/29/2022   PLT 235.0 03/29/2022   GLUCOSE 109 (H) 03/29/2022   GLUCOSE 109 (H) 03/29/2022   CHOL 134 03/29/2022   TRIG 140.0 03/29/2022   HDL 41.20 03/29/2022   LDLDIRECT 125.0 06/25/2018   LDLCALC 64 03/29/2022   ALT 16 03/29/2022   AST 14 03/29/2022   NA 138  03/29/2022   NA 138 03/29/2022   K 4.1 03/29/2022   K 4.1 03/29/2022   CL 100 03/29/2022   CL 100 03/29/2022   CREATININE 0.37 (L) 03/29/2022   CREATININE 0.37 (L) 03/29/2022   BUN 6 03/29/2022   BUN 6 03/29/2022   CO2 30 03/29/2022   CO2 30 03/29/2022   TSH 4.22 03/29/2022   HGBA1C 6.7 (H) 03/29/2022    Lab Results  Component Value Date   TSH 4.22 03/29/2022   Lab Results  Component Value Date   WBC 6.7 03/29/2022   HGB 13.8 03/29/2022   HCT 41.7 03/29/2022   MCV 80.5 03/29/2022   PLT 235.0 03/29/2022   Lab Results  Component Value Date   NA 138 03/29/2022   NA 138 03/29/2022   K 4.1 03/29/2022   K 4.1 03/29/2022   CO2 30 03/29/2022   CO2 30 03/29/2022   GLUCOSE 109 (H) 03/29/2022   GLUCOSE 109 (H) 03/29/2022   BUN 6 03/29/2022   BUN 6 03/29/2022   CREATININE 0.37 (L) 03/29/2022   CREATININE 0.37 (L) 03/29/2022   BILITOT 0.5 03/29/2022   ALKPHOS 93 03/29/2022   AST 14 03/29/2022   ALT 16 03/29/2022   PROT 7.2 03/29/2022   ALBUMIN 4.2 03/29/2022   CALCIUM 9.7 03/29/2022   CALCIUM 9.7 03/29/2022   ANIONGAP 6 06/21/2018   GFR 102.91 03/29/2022   GFR 102.91 03/29/2022   Lab Results  Component Value Date   CHOL 134 03/29/2022   Lab Results  Component Value Date   HDL 41.20 03/29/2022   Lab  Results  Component Value Date   LDLCALC 64 03/29/2022   Lab Results  Component Value Date   TRIG 140.0 03/29/2022   Lab Results  Component Value Date   CHOLHDL 3 03/29/2022   Lab Results  Component Value Date   HGBA1C 6.7 (H) 03/29/2022       Assessment & Plan:  Essential hypertension Assessment & Plan: Well controlled, no changes to meds. Encouraged heart healthy diet such as the DASH diet and exercise as tolerated.   Orders: -     CBC with Differential/Platelet -     Comprehensive metabolic panel -     TSH  Hyperglycemia Assessment & Plan: hgba1c acceptable, minimize simple carbs. Increase exercise as tolerated.   Orders: -     Hemoglobin A1c -     Microalbumin / creatinine urine ratio  Mixed hyperlipidemia Assessment & Plan: Encouraged heart healthy diet, increase exercise, avoid trans fats, consider a krill oil cap daily  Orders: -     Lipid panel  Preventative health care Assessment & Plan: Patient encouraged to maintain heart healthy diet, regular exercise, adequate sleep. Consider daily probiotics. Take medications as prescribed. Labs ordered and reviewed. Has ACP documents in chart [facility] 02/2022 repeat every 1-2 years Cologuard is overdue repeat ordered today Has aged out of pap Consider Dexa scan Consider Prevnar 20, Arexvy RSV vaccine, annual covid and flu boosters   Estrogen deficiency -     DG Bone Density; Future  Post-menopausal -     DG Bone Density; Future  Need for pneumococcal 20-valent conjugate vaccination -     Pneumococcal conjugate vaccine 20-valent  Need for influenza vaccination -     Flu Vaccine Trivalent High Dose (Fluad)  Incomplete quadriplegia at C5-6 level [facility]) Assessment & Plan: Patient is in need of a new power wheelchair. She is working with [employer], Julianne Rice fax  [phone]. They need an order  for a power wheelchair evaluation which we will provide. Her current wheelchair has been in use since April 2017. Elya  will require special seating, cushioning, and positioning to provide trunk support and to relieve pressure and preven pressure sores. She has limited use of her upper extremities which means she is unable to boost herself up in her chair or shift her position to relieve pressure and prevent sores. The power tilt, power recline and power elevating seat and power elevating leg rest are some essential adjustable features she requires on her power wheelchair to help her maintain healthy skin and circulation. Her power wheelchair is essential for her to perform all of her activities of daily living such as grooming, bathing, toileting and eating.      Assessment and Plan    Colorectal Cancer Screening Last Cologuard was negative and is now overdue. -Order Cologuard today.  Osteoporosis screen -Order bone density scan.  Immunizations Discussed the need for various vaccinations including flu, Prevnar 20, RSV (Rexvy), and COVID booster. -Administer Prevnar 20 and high-dose flu vaccine today. -Consider RSV (Rexvy) and COVID booster in 2-3 weeks.  Dementia Stable with current medication regimen. -Continue current medication regimen.  Foot Pain Chronic pain in right foot due to arthritic changes. No current skin breakdown or pressure sores. -Consider lidocaine gel or Voltaren gel for pain relief once skin is healed.  General Health Maintenance / Followup Plans -Complete blood work today. -Follow-up appointment in April 2025.         Danise Edge, MD

## 2023-04-19 LAB — COMPREHENSIVE METABOLIC PANEL
ALT: 17 U/L (ref 0–35)
AST: 15 U/L (ref 0–37)
Albumin: 4.1 g/dL (ref 3.5–5.2)
Alkaline Phosphatase: 92 U/L (ref 39–117)
BUN: 8 mg/dL (ref 6–23)
CO2: 29 meq/L (ref 19–32)
Calcium: 9.3 mg/dL (ref 8.4–10.5)
Chloride: 99 meq/L (ref 96–112)
Creatinine, Ser: 0.42 mg/dL (ref 0.40–1.20)
GFR: 99.07 mL/min (ref >60.00)
Glucose, Bld: 103 mg/dL — ABNORMAL HIGH (ref 70–99)
Potassium: 3.7 meq/L (ref 3.5–5.1)
Sodium: 137 meq/L (ref 135–145)
Total Bilirubin: 0.5 mg/dL (ref 0.2–1.2)
Total Protein: 7.4 g/dL (ref 6.0–8.3)

## 2023-04-19 LAB — CBC WITH DIFFERENTIAL/PLATELET
Basophils Absolute: 0.1 10*3/uL (ref 0.0–0.1)
Basophils Relative: 1 % (ref 0.0–3.0)
Eosinophils Absolute: 0.1 10*3/uL (ref 0.0–0.7)
Eosinophils Relative: 1.8 % (ref 0.0–5.0)
HCT: 42.4 % (ref 36.0–46.0)
Hemoglobin: 14 g/dL (ref 12.0–15.0)
Lymphocytes Relative: 31.5 % (ref 12.0–46.0)
Lymphs Abs: 2.2 10*3/uL (ref 0.7–4.0)
MCHC: 32.9 g/dL (ref 30.0–36.0)
MCV: 81.3 fL (ref 78.0–100.0)
Monocytes Absolute: 0.4 10*3/uL (ref 0.1–1.0)
Monocytes Relative: 5.7 % (ref 3.0–12.0)
Neutro Abs: 4.2 10*3/uL (ref 1.4–7.7)
Neutrophils Relative %: 60 % (ref 43.0–77.0)
Platelets: 228 10*3/uL (ref 150.0–400.0)
RBC: 5.22 Mil/uL — ABNORMAL HIGH (ref 3.87–5.11)
RDW: 14.5 % (ref 11.5–15.5)
WBC: 7.1 10*3/uL (ref 4.0–10.5)

## 2023-04-19 LAB — LIPID PANEL
Cholesterol: 144 mg/dL (ref 0–200)
HDL: 40.9 mg/dL (ref >39.00)
LDL Cholesterol: 79 mg/dL (ref 0–99)
NonHDL: 103.51
Total CHOL/HDL Ratio: 4
Triglycerides: 124 mg/dL (ref 0.0–149.0)
VLDL: 24.8 mg/dL (ref 0.0–40.0)

## 2023-04-19 LAB — MICROALBUMIN / CREATININE URINE RATIO
Creatinine,U: 21.3 mg/dL
Microalb Creat Ratio: 6.3 mg/g (ref 0.0–30.0)
Microalb, Ur: 1.3 mg/dL (ref 0.0–1.9)

## 2023-04-19 LAB — HEMOGLOBIN A1C: Hgb A1c MFr Bld: 6.9 % — ABNORMAL HIGH (ref 4.6–6.5)

## 2023-04-19 LAB — TSH: TSH: 2.59 u[IU]/mL (ref 0.35–5.50)

## 2023-04-21 ENCOUNTER — Other Ambulatory Visit: Payer: Self-pay | Admitting: Family Medicine

## 2023-04-21 ENCOUNTER — Encounter: Payer: Self-pay | Admitting: *Deleted

## 2023-04-26 DIAGNOSIS — Z933 Colostomy status: Secondary | ICD-10-CM | POA: Diagnosis not present

## 2023-04-26 DIAGNOSIS — R32 Unspecified urinary incontinence: Secondary | ICD-10-CM | POA: Diagnosis not present

## 2023-04-26 DIAGNOSIS — G8250 Quadriplegia, unspecified: Secondary | ICD-10-CM | POA: Diagnosis not present

## 2023-04-27 DIAGNOSIS — R32 Unspecified urinary incontinence: Secondary | ICD-10-CM | POA: Diagnosis not present

## 2023-04-27 DIAGNOSIS — Z933 Colostomy status: Secondary | ICD-10-CM | POA: Diagnosis not present

## 2023-04-27 DIAGNOSIS — G8250 Quadriplegia, unspecified: Secondary | ICD-10-CM | POA: Diagnosis not present

## 2023-05-02 ENCOUNTER — Other Ambulatory Visit: Payer: Self-pay | Admitting: Family Medicine

## 2023-05-08 ENCOUNTER — Ambulatory Visit (HOSPITAL_BASED_OUTPATIENT_CLINIC_OR_DEPARTMENT_OTHER)
Admission: RE | Admit: 2023-05-08 | Discharge: 2023-05-08 | Disposition: A | Discharge status: Home / Self Care | Payer: Medicare PPO | Source: Ambulatory Visit | Attending: Family Medicine | Admitting: Family Medicine

## 2023-05-08 DIAGNOSIS — E2839 Other primary ovarian failure: Secondary | ICD-10-CM | POA: Insufficient documentation

## 2023-05-08 DIAGNOSIS — Z780 Asymptomatic menopausal state: Secondary | ICD-10-CM | POA: Insufficient documentation

## 2023-05-10 ENCOUNTER — Other Ambulatory Visit (HOSPITAL_COMMUNITY): Payer: Self-pay

## 2023-05-29 NOTE — Telephone Encounter (Signed)
Called [employer] and spoke with Abby in regards to the status of power wheelchair order. She said they haven't received an order or fax for a new wheelchair. She asked if it had been cleared through [employer]. I called the patient and she said Irving Burton was handling everything at [facility]. Just wanted to make you aware

## 2023-05-29 NOTE — Telephone Encounter (Signed)
Pt called to follow up on status of wheel chair order. Pt said it was discussed at length at her 11/5 appt but no documentation on whether it was sent to [facility] @ [phone]. Please call pt to advise what status is or to send info over to [employer] so she can get her wheelchair as it's been over a month.

## 2023-05-31 ENCOUNTER — Other Ambulatory Visit: Payer: Self-pay | Admitting: *Deleted

## 2023-05-31 DIAGNOSIS — G8250 Quadriplegia, unspecified: Secondary | ICD-10-CM

## 2023-05-31 NOTE — Telephone Encounter (Addendum)
Spoke with Irving Burton [phone]  and she stated they needed the ov notes and rx for PT evaluation.  PT evaluation rx and ov notes faxed over to [facility] at [employer].  Patient notified.

## 2023-06-09 ENCOUNTER — Encounter: Payer: Self-pay | Admitting: *Deleted

## 2023-06-21 ENCOUNTER — Encounter: Payer: Self-pay | Admitting: Family Medicine

## 2023-06-22 DIAGNOSIS — G8250 Quadriplegia, unspecified: Secondary | ICD-10-CM | POA: Diagnosis not present

## 2023-06-22 DIAGNOSIS — R32 Unspecified urinary incontinence: Secondary | ICD-10-CM | POA: Diagnosis not present

## 2023-06-22 DIAGNOSIS — Z933 Colostomy status: Secondary | ICD-10-CM | POA: Diagnosis not present

## 2023-06-26 DIAGNOSIS — L570 Actinic keratosis: Secondary | ICD-10-CM | POA: Diagnosis not present

## 2023-06-26 DIAGNOSIS — D485 Neoplasm of uncertain behavior of skin: Secondary | ICD-10-CM | POA: Diagnosis not present

## 2023-06-26 DIAGNOSIS — Z85828 Personal history of other malignant neoplasm of skin: Secondary | ICD-10-CM | POA: Diagnosis not present

## 2023-06-26 DIAGNOSIS — L820 Inflamed seborrheic keratosis: Secondary | ICD-10-CM | POA: Diagnosis not present

## 2023-06-26 DIAGNOSIS — L718 Other rosacea: Secondary | ICD-10-CM | POA: Diagnosis not present

## 2023-06-26 DIAGNOSIS — D2272 Melanocytic nevi of left lower limb, including hip: Secondary | ICD-10-CM | POA: Diagnosis not present

## 2023-06-26 DIAGNOSIS — L821 Other seborrheic keratosis: Secondary | ICD-10-CM | POA: Diagnosis not present

## 2023-06-26 DIAGNOSIS — D2262 Melanocytic nevi of left upper limb, including shoulder: Secondary | ICD-10-CM | POA: Diagnosis not present

## 2023-06-26 DIAGNOSIS — L720 Epidermal cyst: Secondary | ICD-10-CM | POA: Diagnosis not present

## 2023-06-29 ENCOUNTER — Other Ambulatory Visit: Payer: Self-pay | Admitting: Family Medicine

## 2023-06-29 DIAGNOSIS — G4733 Obstructive sleep apnea (adult) (pediatric): Secondary | ICD-10-CM | POA: Diagnosis not present

## 2023-07-12 ENCOUNTER — Ambulatory Visit: Payer: Medicare PPO | Attending: Family Medicine | Admitting: Physical Therapy

## 2023-07-12 DIAGNOSIS — M6281 Muscle weakness (generalized): Secondary | ICD-10-CM | POA: Diagnosis not present

## 2023-07-12 DIAGNOSIS — G8250 Quadriplegia, unspecified: Secondary | ICD-10-CM | POA: Diagnosis not present

## 2023-07-12 NOTE — Therapy (Incomplete)
OUTPATIENT PHYSICAL THERAPY WHEELCHAIR EVALUATION   Patient Name: [name] MRN: [id number] DOB:1950/11/28, 73 y.o., female Today's Date: 07/12/2023  END OF SESSION:  PT End of Session - 07/12/23 1450     Visit Number 1    Number of Visits 1    Date for PT Re-Evaluation 07/12/23    Authorization Type [employer]    PT Start Time 1450    PT Stop Time 1550    PT Time Calculation (min) 60 min    Activity Tolerance Patient tolerated treatment well    Behavior During Therapy [facility] for tasks assessed/performed             Past Medical History:  Diagnosis Date   Acute bronchitis 05/16/2015   Anemia    after shoulder replacement   Autonomic dysreflexia    AVM (arteriovenous malformation) spine 1981   Bowel dysfunction    constipation and fecal retention   Cerumen impaction 10/11/2015   Complication of anesthesia    low temperature after general anesthesia for colostomy   Constipation 01/25/2016   Contusion 05/18/2014   Of foot   Depression 02/13/2017   History of    Headache 04/09/2017   History of kidney stones    History of neurogenic bowel    History of rotator cuff tear    Right   History of ventral hernia    Hyperglycemia 05/18/2015   Hyperlipidemia    Hypertension    No medications at this time   Incomplete quadriplegia at C5-6 level (HCC) 07/24/2008   Qualifier: Diagnosis of  By: Nena Jordan Requires bowel regimen including enemas and indwelling foley cath Has had tendency towards pressure sores in past. Had a tear with a transfer in Hospital in past for surgery and developed a sore at that time.    Low blood pressure reading    Occassionally   Medicare annual wellness visit, subsequent 11/02/2014   Follows with [facility] Declines colonoscopy  No h/o abnormal pap, declines further paps   MVP (mitral valve prolapse) 12/01/2012   Mild   Neurogenic bladder    OA (osteoarthritis)    Hip and shoulders, neck   Obesity 11/01/2016   OSA on CPAP  01/31/2016   Pain in joint, shoulder region 12/01/2012   Long history B/l Follows with Dr Daphine Deutscher at [facility]   Postmenopausal bleeding 12/17/2012   Preventative health care 06/26/2013   Quadriplegia (HCC) 1981   Secondary to ruptured AVM C5-C6   Sacral pressure ulcer 12/01/2012   H/o Follows with Dr Duane Lope of Dermatology   Skin cancer 11/01/2016   2 BCC RLE excised by Dr Karlyn Agee 2018 1SCC LLE excised by Dr Karlyn Agee 2018   Vaginitis and vulvovaginitis 12/17/2012   Wears glasses    Past Surgical History:  Procedure Laterality Date   ANAL TAG REMOVAL  2014   BASAL CELL CARCINOMA EXCISION  2016   Lip   BASAL CELL CARCINOMA EXCISION  2018   right leg x2   BASAL CELL CARCINOMA EXCISION Left 2018   leg, chest   BASAL CELL CARCINOMA EXCISION Right 2019   leg   BLADDER STONE REMOVAL  1986/87   COLONOSCOPY  02/22/2016   COLOSTOMY  02/22/2016   CYSTOSCOPY WITH BIOPSY N/A 06/21/2018   Procedure: CYSTOSCOPY WITH BIOPSY FULGURATION;  Surgeon: Bjorn Pippin, MD;  Location: [facility];  Service: Urology;  Laterality: N/A;   CYSTOSCOPY WITH LITHOLAPAXY N/A 06/21/2018  Procedure: CYSTOSCOPY WITH LITHOLAPAXY;  Surgeon: Bjorn Pippin, MD;  Location: [facility];  Service: Urology;  Laterality: N/A;   DORSAL CHEILECTOMY-HALLUX RIGIDUS Bilateral 2011   HERNIA REPAIR  2012   VENTRAL umbilical hernia repair   LAMINECTOMY  10/23/1979   AVM   LAPAROSCOPIC SIGMOID COLECTOMY  02/22/2016   ROTATOR CUFF REPAIR  1996, 1997   left shoulder   ROTATOR CUFF REPAIR Right 2001   SHOULDER ARTHROSCOPY  2007   right   SQUAMOUS CELL CARCINOMA EXCISION Left 2018   leg   Toe Nails Removal Bilateral 1981   both great toes   TOTAL SHOULDER REPLACEMENT  2001   left   TRANSFER TENDON HAND  1982, 1983   to right hand X3   VENTRAL HERNIA REPAIR  08/17/2010   Postop ileus (Dr. Andrey Campanile)   Patient Active Problem List   Diagnosis Date Noted   Atopic dermatitis 11/18/2019   Toe  infection 08/12/2019   Vaginal bleeding 07/18/2019   Kidney stone 06/25/2018   Colostomy status (HCC) 02/19/2018   Right knee pain 04/09/2017   Right hip pain 04/09/2017   Headache 04/09/2017   Depression 02/13/2017   Obesity 11/01/2016   OSA (obstructive sleep apnea) 01/31/2016   Constipation 01/25/2016   Cerumen impaction 10/11/2015   Hyperglycemia 05/18/2015   Medicare annual wellness visit, subsequent 11/02/2014   Preventative health care 06/26/2013   Postmenopausal bleeding 12/17/2012   Neurogenic bladder 12/01/2012   Right shoulder pain 12/01/2012   Mitral valve disorder 12/01/2012   Rhinitis 01/03/2012   CALLUS, RIGHT FOOT 10/30/2009   ALOPECIA 10/06/2009   INSOMNIA 08/04/2009   Anemia 08/28/2008   Hyperlipidemia 07/27/2008   Essential hypertension 07/27/2008   Incomplete quadriplegia at C5-6 level (HCC) 07/24/2008    PCP: Bradd Canary, MD  REFERRING PROVIDER: Bradd Canary, MD  THERAPY DIAG:  Muscle weakness (generalized)  Rationale for Evaluation and Treatment Habilitation  SUBJECTIVE:                                                                                                                                                                                           SUBJECTIVE STATEMENT: Pt presents for wheelchair evaluation. Pt interested in obtaining a new power wheelchair as her current chair is 73 years old and is showing age-related wear and tear and has an extensive repair history. Pt also interested in getting a new cushion with her new chair for skin protection but also stability for transfers and sitting balance. Pt has also noticed herself starting to lean to her L side and is interested in getting lateral supports to help hold her  in more of a midline position.  *** Drives her Zenaida Niece, barely fits in her wheelchair Zenaida Niece in her current chair   Anterior tilt for active reach Age related wear and tear on her current chair Extensive repair history so  new parts on an old chair Wanted an updated cushion with air/roho under buttocks but foam for stability Back replaced several times  Lateral support because she finds herself leaning to the L Has a R hip obliquity  225 lbs, 5'11"    PRECAUTIONS: Fall   RED FLAGS: None    WEIGHT BEARING RESTRICTIONS No    OCCUPATION:  retired  PLOF:  Independent with household mobility with device, Requires assistive device for independence, Needs assistance with ADLs, Needs assistance with homemaking, and Needs assistance with transfers  PATIENT GOALS: to obtain power wheeled mobility to allow for safe and independent pressure relief, safe and independent mobility in the home, and ability to complete MRADLs in a safe and timely manner           MEDICAL HISTORY:  Primary diagnosis onset: 6     Medical Diagnosis with ICD-10 code: incomplete quadriplegia of C5-6 G82.54   [] Progressive disease  Relevant future surgeries: N/A    Height: 225 lbs Weight: 5'11" Explain recent changes or trends in weight:  N/A    History:  Past Medical History:  Diagnosis Date   Acute bronchitis 05/16/2015   Anemia    after shoulder replacement   Autonomic dysreflexia    AVM (arteriovenous malformation) spine 1981   Bowel dysfunction    constipation and fecal retention   Cerumen impaction 10/11/2015   Complication of anesthesia    low temperature after general anesthesia for colostomy   Constipation 01/25/2016   Contusion 05/18/2014   Of foot   Depression 02/13/2017   History of    Headache 04/09/2017   History of kidney stones    History of neurogenic bowel    History of rotator cuff tear    Right   History of ventral hernia    Hyperglycemia 05/18/2015   Hyperlipidemia    Hypertension    No medications at this time   Incomplete quadriplegia at C5-6 level (HCC) 07/24/2008   Qualifier: Diagnosis of  By: Nena Jordan Requires bowel regimen including enemas and indwelling foley cath Has had  tendency towards pressure sores in past. Had a tear with a transfer in Hospital in past for surgery and developed a sore at that time.    Low blood pressure reading    Occassionally   Medicare annual wellness visit, subsequent 11/02/2014   Follows with [facility] Declines colonoscopy  No h/o abnormal pap, declines further paps   MVP (mitral valve prolapse) 12/01/2012   Mild   Neurogenic bladder    OA (osteoarthritis)    Hip and shoulders, neck   Obesity 11/01/2016   OSA on CPAP 01/31/2016   Pain in joint, shoulder region 12/01/2012   Long history B/l Follows with [facility]   Postmenopausal bleeding 12/17/2012   Preventative health care 06/26/2013   Quadriplegia (HCC) 1981   Secondary to ruptured AVM C5-C6   Sacral pressure ulcer 12/01/2012   H/o Follows with Dr Duane Lope of Dermatology   Skin cancer 11/01/2016   2 BCC RLE excised by Dr Karlyn Agee 2018 1SCC LLE excised by Dr Karlyn Agee 2018   Vaginitis and vulvovaginitis 12/17/2012   Wears glasses        Cardio  Status:  Functional Limitations: long term power wheelchair user  [] Intact  [x]  Impaired      Respiratory Status:  Functional Limitations: long term power wheelchair user  [] Intact  [x] Impaired   [] SOB [] COPD [] O2 Dependent ______LPM  [] Ventilator Dependent  Resp equip:                                                     Objective Measure(s):   Orthotics:   [] Amputee:                                                             [] Prosthesis:        HOME ENVIRONMENT:  [x] House [] Condo/town home [] Apartment [] Asst living [] LTCF         [x] Own  [] Rent   [] Lives alone [x] Lives with others -     sister                        Hours without assistance: 0  [x] Home is accessible to patient                                 Storage of wheelchair:  [x] In home   [] Other Comments:        COMMUNITY :  TRANSPORTATION:  [] Car [x] Van [] Public Transportation [] Adapted w/c Lift []  Ambulance [] Other:                      [x] Sits in wheelchair during transport   Where is w/c stored during transport?  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] yes [] no   Employment and/or school: N/A Specific requirements pertaining to mobility        Other:  COMMUNICATION:  Verbal Communication  [x] WFL [x] receptive [x] WFL [x] expressive [] Understandable  [] Difficult to understand  [] non-communicative  Primary Language:____English__________ 2nd:_____________  Communication provided by:[x] Patient [] Family [] Caregiver [] Translator   [] Uses an augmentative communication device     Manufacturer/Model :                                                                MOBILITY/BALANCE:  Sitting Balance  Standing Balance  Transfers  Ambulation   [] WFL      [] WFL  [] Independent  []  Independent   [x] Uses UE for balance in sitting Comments: unable to maintain sitting balance without UE support and a stable surface [] Uses UE/device for stability Comments:  []  Min assist  []  Ambulates independently with       device:___________________      []  Mod assist  []  Able to ambulate ______ feet        safely/functionally/independently   []  Min assist  []  Min assist  []  Max assist  []  Non-functional ambulator  History/High risk of falls   []  Mod assist  []  Mod assist  [x]  Dependent  [x]  Unable to ambulate   []  Max  assist  []  Max assist  Transfer method:[] 1 person [] 2 person [] sliding board [] squat pivot [] stand pivot [x] mechanical patient lift  [] other:   []  Unable  [x]  Unable    Fall History: # of falls in the past 6 months? 0 # of "near" falls in the past 6 months? 0    CURRENT SEATING / MOBILITY:  Current Mobility Device: [] None [] Cane/Walker [] Manual [] Dependent [] Dependent w/ Tilt rScooter  [x] Power (type of control):   Manufacturer: Permobil Model: F3 Serial #:   Size:  Color: black Age: 12 years (received in April 2017)  Purchased by whom: insurance/patient  Current condition of  mobility base:  shows age-related wear and tear; has an extensive history of repairs over the past 8 years  Current seating system:               Permobil F3 power wheelchair                                                        Age of seating system:  10 years  Describe posture in present seating system: While seated in her current power wheelchair Ms. Geving appears kyphotic with her hips windswept to the left and elevated on her R side, therefore leading to L lateral trunk lean***   Is the current mobility meeting medical necessity?:  [] Yes [x] No Describe: Ms. Moeckel current chair is 73 years old and shows age-related wear and tear as well as has an extensive repair history over the past few years. She utilizes this chair daily as her only means of mobility and being able to access all areas of her home. Her current chair is not meeting her medical needs as she has had some postural changes within the last 8 years due to aging with a spinal cord injury and now presents with R hip elevation, hips being windswept to the L, some curvature of her spine to the R, and L lateral trunk lean. She requires a new power wheelchair in order to accommodate these postural changes and allow her to continue to access her home environment and perform her MRADL's as safely and independently as possible.***                                    Ability to complete Mobility-Related Activities of Daily Living (MRADL's) with Current Mobility Device:   Move room to room  [x] Independent  [] Min [] Mod [] Max assist  [] Unable  Comments: Patient has caregivers during the week as well as assistance from her sister daily to perform MRADL's.  Meal prep  [] Independent  [] Min [] Mod [] Max assist  [x] Unable    Feeding  [x] Independent  [] Min [] Mod [] Max assist  [] Unable    Bathing  [] Independent  [] Min [x] Mod [] Max assist  [] Unable    Grooming  [] Independent  [] Min [x] Mod [] Max assist  [] Unable    UE dressing  [] Independent  [] Min [] Mod  [x] Max assist  [] Unable    LE dressing  [x] Independent   [] Min [] Mod [] Max assist  [x] Unable    Toileting  [x] Independent  [] Min [] Mod [] Max assist  [] Unable  Bowel Mgt: []  Continent []  Incontinent []  Accidents []  Diapers [x]  Colostomy []  Bowel Program:  Bladder Mgt: []  Continent []  Incontinent []  Accidents []  Diapers []  Urinal []  Intermittent Cath [x]  Indwelling Cath []  Supra-pubic Cath     Current Mobility Equipment Trialed/ Ruled Out:    Does not meet mobility needs due to:    Mark all boxes that indicate inability to use the specific equipment listed     Meets needs for safe  independent functional  ambulation  / mobility    Risk of  Falling or History of Falls    Enviromental limitations      Cognition    Safety concerns with  physical ability    Decreased / limitations endurance  & strength     Decreased / limitations  motor skills  & coordination    Pain    Pace /  Speed    Cardiac and/or  respiratory condition    Contra - indicated by diagnosis   Cane/Crutches  []   [x]   []   []   [x]   [x]   [x]   []   []   []   [x]    Walker / Rollator  []  NA   []   [x]   []   []   [x]   [x]   [x]   []   []   []   [x]     Manual Wheelchair [id number]:  []  NA  []   [x]   []   []   [x]   [x]   [x]   [x]   [x]   []   [x]    Manual W/C (K0005) with power assist  []  NA  []   [x]   []   []   [x]   [x]   [x]   [x]   [x]   []   [x]    Scooter  []  NA  []   [x]   []   []   [x]   [x]   [x]   []   []   []   [x]    Power Wheelchair: standard joystick  []  NA  [x]   []   []   []   []   []   []   []   []   []   []    Power Wheelchair: alternative controls  [x]  NA  []   []   []   []   []   []   []   []   []   []   []    Summary:  The least costly alternative for independent functional mobility was found to be:    []  Crutch/Cane  []  Walker []  Manual w/c  []  Manual w/c with power assist   []  Scooter   [x]  Power w/c std joystick   []  Power w/c alternative control        []  Requires dependent care mobility device   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 are adequate for safe mobility equipment operation  [x]   Yes []   No  Patient is willing and motivated to use recommended mobility equipment  [x]   Yes []   No       []  Patient is unable to safely operate mobility equipment independently and requires dependent care equipment Comments:           SENSATION and SKIN ISSUES:  Sensation []  Intact  [x]  Impaired [x]  Absent []  Hyposensate []  Hypersensate  []  Defensiveness  Location(s) of impairment: C5 and below, absent sensation in LLE   Pressure Relief Method(s):  []  Lean side to side to offload (without risk of falling)  []   W/C push up (4+ times/hour for 15+ seconds) []  Stand up (without risk of falling)    [x]  Other: (Describe): use of tilt/recline features on power wheelchair Effective  pressure relief method(s) above can be performed consistently throughout the day: [x] Yes  []  No If not, Why?:  Skin Integrity Risk:       []  Low risk           []  Moderate risk            [x]  High risk  If high risk, explain: Ms. Rundquist it at a high risk for skin breakdown due to the nature of her injury (C5-6 complete quadriplegia) meaning she has impaired sensation below the level of her injury and is not able to perform effective pressure relief via boosting/position changes with use of her BUE due to impaired strength and ROM from her injury as well as due to having an extensive history of shoulder surgeries, repairs, and replacements. She requires use of tilt/recline features on her power wheelchair in order to perform safe and effective pressure relief.  Skin Issues/Skin Integrity  Current skin Issues  []  Yes [x]  No []  Intact  []   Red area   []   Open area  []  Scar tissue  [x]  At risk from prolonged sitting  Where: sacrum History of Skin Issues  []  Yes [x]  No Where : When: Stage: Hx of skin flap surgeries  []  Yes [x]  No Where:  When:  Pain: [x]  Yes []  No   Pain Location(s): SCI pain from mid-thighs to mid-abdominal area, gets this  3-4 times per week and is "unbearable" Intensity scale: (0-10) : 10/10 How does pain interfere with mobility and/or MRADLs? - when Ms. Dusing is having this pain she is unable to function, uses tilt/recline features on her power wheelchair to reposition herself for comfort and pain management without needing to burden a caregiver        MAT EVALUATION:  Neuro-Muscular Status: (Tone, Reflexive, Responses, etc.)     []   Intact   [x]  Spasticity []  Hypotonicity  []  Fluctuating  []  Muscle Spasms  []  Poor Righting Reactions/Poor Equilibrium Reactions  []  Primal Reflex(s):    Comments: Spasticity in BLE that is elicited with any passive movement of her lower limbs including with transfers           COMMENTS:    POSTURE:     Comments:  Pelvis Anterior/Posterior:  []  Neutral   [x]  Posterior  []  Anterior  []  Fixed - No movement [x]  Tendency away from neutral []  Flexible []  Self-correction []  External correction Obliquity (viewed from front)  []  WFL [x]  R Obliquity []  L Obliquity  []  Fixed - No movement [x]  Tendency away from neutral []  Flexible []  Self-correction []  External correction Rotation  [x]  WFL []  R anterior []  L anterior  []  Fixed - No movement []  Tendency away from neutral []  Flexible []  Self-correction []  External correction Tonal Influence Pelvis:  []  Normal []  Flaccid []  Low tone [x]  Spasticity []  Dystonia []  Pelvis thrust []  Other:    Trunk Anterior/Posterior:  []  WFL [x]  Thoracic kyphosis []  Lumbar lordosis  []  Fixed - No movement []  Tendency away from neutral [x]  Flexible [x]  Self-correction []  External correction  []  WFL [x]  Convex to left  []  Convex to right []  S-curve   [x]  C-curve []  Multiple curves []  Tendency away from neutral [x]  Flexible []  Self-correction [x]  External correction Rotation of shoulders and upper trunk:  []  Neutral []  Left-anterior [x]  Right- anterior []  Fixed- no movement [x]  Tendency away from  neutral []  Flexible []  Self correction []  External correction Tonal influence Trunk:  []  Normal []  Flaccid []  Low tone [  x] Spasticity []  Dystonia []  Other:   Head & Neck  [x]  Functional []  Flexed    []  Extended []  Rotated right  []  Rotated left []  Laterally flexed right []  Laterally flexed left []  Cervical hyperextension   [x]  Good head control []  Adequate head control []  Limited head control []  Absent head control Describe tone/movement of head and neck: [facility]     Lower Extremity Measurements: LE ROM:  Passive ROM Right 07/12/2023 Left 07/12/2023  Hip flexion decreased decreased  Hip extension    Hip abduction    Hip adduction    Knee flexion    Knee extension Tight HS Tight HS  Ankle dorsiflexion Tight gastroc Tight gastoc  Ankle plantarflexion     (Blank rows = not tested)  LE MMT:  MMT Right 07/12/2023 Left 07/12/2023  Hip flexion 0 0  Hip extension    Hip abduction    Hip adduction    Knee flexion 0 0  Knee extension 0 0  Ankle dorsiflexion 0 0  Ankle plantarflexion     (Blank rows = not tested)  Hip positions:  []  Neutral   [x]  Abducted   []  Adducted  []  Subluxed   []  Dislocated   []  Fixed   []  Tendency away from neutral [x]  Flexible []  Self-correction [x]  External correction   Hip Windswept:[]  Neutral  []  Right    [x]  Left  []  Subluxed   []  Dislocated   []  Fixed   []  Tendency away from neutral [x]  Flexible []  Self-correction [x]  External correction  LE Tone: []  Normal []  Low tone [x]  Spasticity []  Flaccid []  Dystonia []  Rocks/Extends at hip []  Thrust into knee extension []  Pushes legs downward into footrest  Foot positioning: ROM Concerns: Dorsiflexed: []  Right   []  Left Plantar flexed: [x]  Right    [x]  Left Inversion: []  Right    []  Left Eversion: []  Right    []  Left  LE Edema: []  1+ (Barely detectable impression when finger is pressed into skin) [x]  2+ (slight indentation. 15 seconds to rebound) []  3+ (deeper indentation.  30 seconds to rebound) []  4+ (>30 seconds to rebound)  UE Measurements:  UPPER EXTREMITY ROM:   Active ROM Right 07/12/2023 Left 07/12/2023  Shoulder flexion Limited to less than 90 degrees due to shoulder surgery and injury level Limited to less than 90 degrees due to shoulder surgery and injury level  Shoulder abduction Limited to less than 90 degrees due to shoulder surgery and injury level Limited to less than 90 degrees due to shoulder surgery and injury level  Shoulder adduction    Elbow flexion    Elbow extension    Wrist flexion    Wrist extension    (Blank rows = not tested)  UPPER EXTREMITY MMT:  MMT Right 07/12/2023 Left 07/12/2023  Shoulder flexion 2- 2-  Shoulder abduction 2- 2-  Shoulder adduction    Elbow flexion    Elbow extension    Wrist flexion    Wrist extension    Pinch strength    Grip strength    (Blank rows = not tested)  Shoulder Posture:  Right Tendency towards Left  []   Functional []    []   Elevation []    [x]   Depression [x]    [x]   Protraction [x]    []   Retraction []    []   Internal rotation []    []   External rotation []    []   Subluxed []     UE Tone: []  Normal []  Flaccid [x]  Low  tone []  Spasticity  []  Dystonia []  Other:   UE Edema: []  1+ (Barely detectable impression when finger is pressed into skin) []  2+ (slight indentation. 15 seconds to rebound) []  3+ (deeper indentation. 30 seconds to rebound) []  4+ (>30 seconds to rebound)  Wrist/Hand: Handedness: [x]  Right   []  Left   []  NA: Comments:  Right  Left  []   WNL []    []   Limitations [x]    []   Contractures []    [x]   Fisting []    []   Tremors []    [x]   Weak grasp []    [x]   Poor dexterity []    []   Hand movement non functional []    []   Paralysis []         MOBILITY BASE RECOMMENDATIONS and JUSTIFICATION:  MOBILITY BASE  JUSTIFICATION   Manufacturer:   Permobil Model:    F3                          Color: black Seat Width:  19" Seat Depth: 22"   []  Manual mobility base  (continue below)   []  Scooter/POV  [x]  Power mobility base   Number of hours per day spent in above selected mobility base: ***  Typical daily mobility base use Schedule: ***   [x]  is not a safe, functional ambulator  []  limitation prevents from completing a MRADL(s) within a reasonable time frame    []  limitation places at high risk of morbidity or mortality secondary to  the attempts to perform a    MRADL(s)  [x]  limitation prevents accomplishing a MRADL(s) entirely  [x]  provide independent mobility  [x]  equipment is a lifetime medical need  [x]  walker or cane inadequate  [x]  any type manual wheelchair      inadequate  [x]  scooter/POV inadequate      []  requires dependent mobility         POWER MOBILITY      []  Scooter/POV    []  can safely operate   []  can safely transfer   []  has adequate trunk stability   []  cannot functionally propel  manual wheelchair    [x]  Power mobility base    [x]  non-ambulatory   [x]  cannot functionally propel manual wheelchair   [x]  cannot functionally and safely      operate scooter/POV  [x]  can safely operate power       wheelchair  [x]  home is accessible  [x]  willing to use power wheelchair     Tilt  [x]  Powered tilt on powered chair  []  Powered tilt on manual chair  []  Manual tilt on manual chair Comments:  []  change position for pressure      []  elief/cannot weight shift   []  change position against      gravitational force on head and      shoulders   []  decrease pain  []  blood pressure management   []  control autonomic dysreflexia  []  decrease respiratory distress  []  management of spasticity  []  management of low tone  []  facilitate postural control   []  rest periods   []  control edema  []  increase sitting tolerance   []  aid with transfers     Recline   [x]  Power recline on power chair  []  Manual recline on manual chair  Comments:    []  intermittent catheterization  []  manage spasticity  []  accommodate femur to back  angle  []  change position for pressure relief/cannot weight shift rhigh risk  of pressure sore development  []  tilt alone does not accomplish     effective pressure relief, maximum pressure relief achieved at -      _______ degrees tilt   _______ degrees recline   []  difficult to transfer to and from bed []  rest periods and sleeping in chair  []  repositioning for transfers  []  bring to full recline for ADL care  []  clothing/diaper changes in chair  []  gravity PEG tube feeding  []  head positioning  []  decrease pain  []  blood pressure management   []  control autonomic dysreflexia  []  decrease respiratory distress  []  user on ventilator     Elevator on mobility base  [x]  Power wheelchair  []  Scooter  []  increase Indep in transfers   []  increase Indep in ADLs    []  bathroom function and safety  []  kitchen/cooking function and safety  []  shopping  []  raise height for communication at standing level  []  raise height for eye contact which reduces cervical neck strain and pain  []  drive at raised height for safety and navigating crowds  []  Other:   []  Vertical position system  (anterior tilt)     (Drive locks-out)    []  Stand       (Drive enabled)  []  independent weight bearing  []  decrease joint contractures  []  decrease/manage spasticity  []  decrease/manage spasms  []  pressure distribution away from   scapula, sacrum, coccyx, and ischial tuberosity  []  increase digestion and elimination   []  access to counters and cabinets  []  increase reach  []  increase interaction with others at eye level, reduces neck strain  []  increase performance of       MRADL(s)      Power elevating legrest    [x]  Center mount (Single) 85-170 degrees       []  Standard (Pair) 100-170 degrees  []  position legs at 90 degrees, not available with std power ELR  []  center mount tucks into chair to decrease turning radius in home, not available with std power ELR  []  provide change in position for LE  []   elevate legs during recline    []  maintain placement of feet on      footplate  []  decrease edema  []  improve circulation  []  actuator needed to elevate legrest  []  actuator needed to articulate legrest preventing knees from flexing  []  Increase ground clearance over      curbs  []   STD (pair) independently                     elevate legrest   POWER WHEELCHAIR CONTROLS      Controls/input device  [x]  Expandable  []  Non-expandable  [x]  Proportional  [x]  Right Hand []  Left Hand  []  Non-proportional/switches/head-array  []  Electrical/proximity         []   Mechanical      Manufacturer:___________________   Type:________________________ []  provides access for controlling wheelchair  []  programming for accurate control  []  progressive disease/changing condition  []  required for alternative drive      controls       []  lacks motor control to operate  proportional drive control  []  unable to understand proportional controls  []  limited movement/strength  []  extraneous movement / tremors / ataxic / spastic       [x]  Upgraded electronics controller/harness    []  Single power (tilt or recline)   []  Expandable    []  Non-expandable plus   [  x] Multi-power (tilt, recline, power legrest, power seat lift, vertical positioning system, stand)  []  allows input device to communicate with drive motors  []  harness provides necessary connections between the controller, input device, and seat functions     []  needed in order to operate power seat functions through joystick/ input device  []  required for alternative drive controls     []  Enhanced display  []  required to connect all alternative drive controls   []  required for upgraded joystick      (lite-throw, heavy duty, micro)  []  Allows user to see in which mode and drive the wheelchair is set; necessary for alternate controls       []  Upgraded tracking electronics  []  correct tracking when on uneven surfaces makes switch driving more efficient  and less fatiguing  []  increase safety when driving  []  increase ability to traverse thresholds    []  Safety / reset / mode switches     Type:    []  Used to change modes and stop the wheelchair when driving     [x]  Mount for joystick / input device/switches  []  swing away for access or transfers   []  attaches joystick / input device / switches to wheelchair   []  provides for consistent access  []  midline for optimal placement    []  Attendant controlled joystick plus     mount  []  safety  []  long distance driving  []  operation of seat functions  []  compliance with transportation regulations    [x]  Battery  []  required to power (power assist / scooter/ power wc / other):   []  Power inverter (24V to 12V)  []  required for ventilator / respiratory equipment / other:     CHAIR OPTIONS MANUAL & POWER      Armrests   [x]  adjustable height []  removable  []  swing away []  fixed  [x]  flip back  []  reclining  [x]  full length pads []  desk []  tube arms [x]  gel pads  []  provide support with elbow at 90    []  remove/flip back/swing away for  transfers  []  provide support and positioning of upper body    []  allow to come closer to table top  []  remove for access to tables  []  provide support for w/c tray  []  change of height/angles for variable activities   []  Elbow support / Elbow stop  []  keep elbow positioned on arm pad  []  keep arms from falling off arm pad  during tilt and/or recline   Upper Extremity Support  []  Arm trough  []   R  []   L  Style:  []  swivel mount []  fixed mount   []  posterior hand support  []   tray  []  full tray  []  joystick cut out  []   R  []   L  Style:  []  decrease gravitational pull on      shoulders  []  provide support to increase UE  function  []  provide hand support in natural    position  []  position flaccid UE  []  decrease subluxation    []  decrease edema       []  manage spasticity   []  provide midline positioning  []  provide work surface  []  placement for  AAC/ Computer/ EADL       Hangers/ Legrests   []  ______ degree  []  Elevating []  articulating  []  swing away []  fixed []  lift off  []  heavy duty  []  adjustable knee angle  []   adjustable calf panel   []  longer extension tube              []  provide LE support  []  maintain placement of feet on      footplate   []  accommodate lower leg length  []  accommodate to hamstring       tightness  []  enable transfers  []  provide change in position for LE's  []  elevate legs during recline    []  decrease edema  []  durability      Foot support   [x]  footplate [x]  R [x]  L [x]  flip up           []  Depth adjustable   [x]  angle adjustable  []  foot board/one piece    []  provide foot support  []  accommodate to ankle ROM  []  allow foot to go under wheelchair base  []  enable transfers     []  Shoe holders  []  position foot    []  decrease / manage spasticity  []  control position of LE  []  stability    []  safety     []  Ankle strap/heel      loops  []  support foot on foot support  []  decrease extraneous movement  []  provide input to heel   []  protect foot     []  Amputee adapter []  R  []  L     Style:                  Size:  []  Provide support for stump/residual extremity    []  Transportation tie-down  []  to provide crash tested tie-down brackets    []  Crutch/cane holder    []  O2 holder    []  IV hanger   []  Ventilator tray/mount    []  stabilize accessory on wheelchair       Component  Justification     [x]  Seat cushion     Merchandiser, retail []  accommodate impaired sensation  []  decubitus ulcers present or history  []  unable to shift weight  []  increase pressure distribution  []  prevent pelvic extension  []  custom required "off-the-shelf"    seat cushion will not accommodate deformity  []  stabilize/promote pelvis alignment  []  stabilize/promote femur alignment  []  accommodate obliquity  []  accommodate multiple deformity  []  incontinent/accidents  []  low maintenance     []  seat mounts                  []  fixed []  removable  []  attach seat platform/cushion to wheelchair frame    []  Seat wedge    []  provide increased aggressiveness of seat shape to decrease sliding  down in the seat  []  accommodate ROM        []  Cover replacement   []  protect back or seat cushion  []  incontinent/accidents    []  Solid seat / insert    []  support cushion to prevent      hammocking  []  allows attachment of cushion to mobility base    [x]  Lateral pelvic/thigh/hip     support (Guides) knee and hip    []  decrease abduction  []  accommodate pelvis  []  position upper legs  []  accommodate spasticity  []  removable for transfers     [x]  Lateral pelvic/thigh      supports mounts  []  fixed   []  swing-away   [x]  removable  []  mounts lateral pelvic/thigh supports     []  mounts lateral pelvic/thigh supports swing-away or removable for transfers    []   Medial thigh support (Pommel)  [] decrease adduction  [] accommodate ROM  []  remove for transfers   []  alignment      []  Medial thigh   []  fixed      support mounts      []  swing-away   []  removable  []  mounts medial thigh supports   []  Mounts medial supports swing- away or removable for transfers       Component  Justification   [x]  Back    3G   []  provide posterior trunk support []  facilitate tone  []  provide lumbar/sacral support []  accommodate deformity  []  support trunk in midline   []  custom required "off-the-shelf" back support will not accommodate deformity   []  provide lateral trunk support []  accommodate or decrease tone            []  Back mounts  []  fixed  []  removable  []  attach back rest/cushion to wheelchair frame   [x]  Lateral trunk      supports  [x]  R [x]  L  []  decrease lateral trunk leaning  []  accommodate asymmetry    []  contour for increased contact  []  safety    []  control of tone    [x]  Lateral trunk      supports mounts  []  fixed  []  swing-away   [x]  removable  []  mounts lateral trunk supports     []  Mounts lateral trunk supports  swing-away or removable for transfers   []  Anterior chest      strap, vest     []  decrease forward movement of shoulder  []  decrease forward movement of trunk  []  safety/stability  []  added abdominal support  []  trunk alignment  []  assistance with shoulder control   []  decrease shoulder elevation    [x]  Headrest      []  provide posterior head support  []  provide posterior neck support  []  provide lateral head support  []  provide anterior head support  []  support during tilt and recline  []  improve feeding     []  improve respiration  []  placement of switches  []  safety    []  accommodate ROM   []  accommodate tone  []  improve visual orientation   [x]  Headrest           []  fixed [x]  removable []  flip down      Mounting hardware   []  swing-away laterals/switches  []  mount headrest   []  mounts headrest flip down or  removable for transfers  []  mount headrest swing-away laterals   []  mount switches     []  Neck Support    []  decrease neck rotation  []  decrease forward neck flexion   Pelvic Positioner    [x]  std hip belt          []  padded hip belt  []  dual pull hip belt  []  four point hip belt  []  stabilize tone  []  decrease falling out of chair  []  prevent excessive extension  []  special pull angle to control      rotation  []  pad for protection over boney   prominence  []  promote comfort    [x]  Essential needs        bag/pouch   [x]  medicines []  special food rorthotics [x]  clothing changes  []  diapers  [x]  catheter/hygiene [x]  ostomy supplies   The above equipment has a life- long use expectancy.  Growth and changes in medical and/or functional conditions would be the exceptions.   SUMMARY:  Why mobility device  was selected; include why a lower level device is not appropriate: ***  ASSESSMENT:  CLINICAL IMPRESSION: Patient is a *** y.o. *** who was seen today for physical therapy evaluation and treatment for ***.    OBJECTIVE IMPAIRMENTS {opptimpairments:25111}.    ACTIVITY LIMITATIONS {activitylimitations:27494}  PARTICIPATION LIMITATIONS: {participationrestrictions:25113}  PERSONAL FACTORS {Personal factors:25162} are also affecting patient's functional outcome.   REHAB POTENTIAL: {rehabpotential:25112}  CLINICAL DECISION MAKING: {clinical decision making:25114}  EVALUATION COMPLEXITY: {Evaluation complexity:25115}                                   GOALS: One time visit. No goals established.    PLAN: PT FREQUENCY: one time visit    Peter Congo, PT Peter Congo, PT, DPT, CSRS  07/12/2023, 5:09 PM    I concur with the above findings and recommendations of the therapist:  Physician name printed:         Physician's signature:      Date:

## 2023-07-13 ENCOUNTER — Ambulatory Visit (INDEPENDENT_AMBULATORY_CARE_PROVIDER_SITE_OTHER): Payer: Medicare PPO

## 2023-07-13 VITALS — Ht 71.0 in | Wt 225.0 lb

## 2023-07-13 DIAGNOSIS — Z0000 Encounter for general adult medical examination without abnormal findings: Secondary | ICD-10-CM | POA: Diagnosis not present

## 2023-07-13 DIAGNOSIS — Z1211 Encounter for screening for malignant neoplasm of colon: Secondary | ICD-10-CM

## 2023-07-13 NOTE — Patient Instructions (Addendum)
[name] , Thank you for taking time to come for your Medicare Wellness Visit. I appreciate your ongoing commitment to your health goals. Please review the following plan we discussed and let me know if I can assist you in the future.   Referrals/Orders/Follow-Ups/Clinician Recommendations:   This is a list of the screening recommended for you and due dates:  Health Maintenance  Topic Date Due   Complete foot exam   Never done   Cologuard (Stool DNA test)  05/23/2018   COVID-19 Vaccine (4 - 2024-25 season) 02/12/2023   Hemoglobin A1C  10/16/2023   Mammogram  02/16/2024   Eye exam for diabetics  03/27/2024   Yearly kidney function blood test for diabetes  04/17/2024   Yearly kidney health urinalysis for diabetes  04/17/2024   Medicare Annual Wellness Visit  07/12/2024   DTaP/Tdap/Td vaccine (3 - Td or Tdap) 03/05/2029   Pneumonia Vaccine  Completed   Flu Shot  Completed   DEXA scan (bone density measurement)  Completed   Hepatitis C Screening  Completed   Zoster (Shingles) Vaccine  Completed   HPV Vaccine  Aged Out    Advanced directives: (In Chart) A copy of your advanced directives are scanned into your chart should your provider ever need it.  Next Medicare Annual Wellness Visit scheduled for next year: Yes

## 2023-07-13 NOTE — Progress Notes (Signed)
Subjective:   [name] is a 73 y.o. female who presents for Medicare Annual (Subsequent) preventive examination.  Visit Complete: Virtual I connected with  [name] on 07/13/23 by a audio enabled telemedicine application and verified that I am speaking with the correct person using two identifiers.  Patient Location: Home  Provider Location: Home Office  I discussed the limitations of evaluation and management by telemedicine. The patient expressed understanding and agreed to proceed.  Vital Signs: Because this visit was a virtual/telehealth visit, some criteria may be missing or patient reported. Any vitals not documented were not able to be obtained and vitals that have been documented are patient reported.  Patient Medicare AWV questionnaire was completed by the patient on 07/12/23; I have confirmed that all information answered by patient is correct and no changes since this date.  Cardiac Risk Factors include: advanced age (>69men, >39 women);hypertension     Objective:    Today's Vitals   07/13/23 1354  Weight: 225 lb (102.1 kg)  Height: 5\' 11"  (1.803 m)   Body mass index is 31.38 kg/m.     07/13/2023    2:07 PM 07/12/2023    2:51 PM 04/25/2022    1:44 PM 01/01/2020    5:47 AM 06/21/2018   11:31 AM 08/07/2017    2:47 PM 06/21/2016    4:12 PM  Advanced Directives  Does Patient Have a Medical Advance Directive? Yes No Yes Yes Yes Yes Yes  Type of Estate agent of [place];Living will  Healthcare Power of [place];Living will  Healthcare Power of [place];Living will Healthcare Power of [place];Living will Healthcare Power of [place];Living will  Does patient want to make changes to medical advance directive? No - Patient declined  No - Patient declined No - Patient declined No - Patient declined    Copy of Healthcare Power of Attorney in Chart? Yes - validated most recent copy scanned in chart (See row information)  Yes - validated most recent copy  scanned in chart (See row information)  No - copy requested Yes No - copy requested  Would patient like information on creating a medical advance directive? No - Patient declined No - Patient declined         Current Medications (verified) Outpatient Encounter Medications as of 07/13/2023  Medication Sig   baclofen (LIORESAL) 20 MG tablet TAKE 1 TABLET BY MOUTH TWICE DAILY   clobetasol (TEMOVATE) 0.05 % external solution Apply 1 application topically every Monday, Wednesday, and Friday. Use on scalp 3 times per week   docusate sodium (COLACE) 100 MG capsule Take 1 capsule (100 mg total) by mouth daily.   ELDERBERRY PO Take 2 each by mouth daily.   FIBER PO Take 1 capsule by mouth daily.   hydrochlorothiazide (MICROZIDE) 12.5 MG capsule Take 1 capsule (12.5 mg total) by mouth daily.   influenza vaccine adjuvanted (FLUAD) 0.5 ML injection Inject into the muscle.   lisinopril (ZESTRIL) 5 MG tablet TAKE 1 TABLET BY MOUTH DAILY   Multiple Vitamins-Calcium (ONE-A-DAY WOMENS FORMULA PO) Take 1 tablet by mouth daily.   OXYBUTYNIN CHLORIDE ER PO Take 10 mg by mouth at bedtime.   Probiotic Product (DIGESTIVE ADVANTAGE) CAPS Take 1 tablet by mouth daily.   rosuvastatin (CRESTOR) 5 MG tablet TAKE 1 TABLET BY MOUTH DAILY   No facility-administered encounter medications on file as of 07/13/2023.    Allergies (verified) Adhesive [tape]   History: Past Medical History:  Diagnosis Date   Acute bronchitis  05/16/2015   Anemia    after shoulder replacement   Autonomic dysreflexia    AVM (arteriovenous malformation) spine 1981   Bowel dysfunction    constipation and fecal retention   Cerumen impaction 10/11/2015   Complication of anesthesia    low temperature after general anesthesia for colostomy   Constipation 01/25/2016   Contusion 05/18/2014   Of foot   Depression 02/13/2017   History of    Headache 04/09/2017   History of kidney stones    History of neurogenic bowel    History of rotator cuff  tear    Right   History of ventral hernia    Hyperglycemia 05/18/2015   Hyperlipidemia    Hypertension    No medications at this time   Incomplete quadriplegia at C5-6 level (HCC) 07/24/2008   Qualifier: Diagnosis of  By: Nena Jordan Requires bowel regimen including enemas and indwelling foley cath Has had tendency towards pressure sores in past. Had a tear with a transfer in Hospital in past for surgery and developed a sore at that time.    Low blood pressure reading    Occassionally   Medicare annual wellness visit, subsequent 11/02/2014   Follows with [facility] Declines colonoscopy  No h/o abnormal pap, declines further paps   MVP (mitral valve prolapse) 12/01/2012   Mild   Neurogenic bladder    OA (osteoarthritis)    Hip and shoulders, neck   Obesity 11/01/2016   OSA on CPAP 01/31/2016   Pain in joint, shoulder region 12/01/2012   Long history B/l Follows with Dr Daphine Deutscher [facility]   Postmenopausal bleeding 12/17/2012   Preventative health care 06/26/2013   Quadriplegia (HCC) 1981   Secondary to ruptured AVM C5-C6   Sacral pressure ulcer 12/01/2012   H/o Follows with Dr Duane Lope of Dermatology   Skin cancer 11/01/2016   2 BCC RLE excised by Dr Karlyn Agee 2018 1SCC LLE excised by Dr Karlyn Agee 2018   Vaginitis and vulvovaginitis 12/17/2012   Wears glasses    Past Surgical History:  Procedure Laterality Date   ANAL TAG REMOVAL  2014   BASAL CELL CARCINOMA EXCISION  2016   Lip   BASAL CELL CARCINOMA EXCISION  2018   right leg x2   BASAL CELL CARCINOMA EXCISION Left 2018   leg, chest   BASAL CELL CARCINOMA EXCISION Right 2019   leg   BLADDER STONE REMOVAL  1986/87   COLONOSCOPY  02/22/2016   COLOSTOMY  02/22/2016   CYSTOSCOPY WITH BIOPSY N/A 06/21/2018   Procedure: CYSTOSCOPY WITH BIOPSY FULGURATION;  Surgeon: Bjorn Pippin, MD;  Location: [facility];  Service: Urology;  Laterality: N/A;   CYSTOSCOPY WITH LITHOLAPAXY N/A 06/21/2018   Procedure:  CYSTOSCOPY WITH LITHOLAPAXY;  Surgeon: Bjorn Pippin, MD;  Location: [facility];  Service: Urology;  Laterality: N/A;   DORSAL CHEILECTOMY-HALLUX RIGIDUS Bilateral 2011   HERNIA REPAIR  2012   VENTRAL umbilical hernia repair   LAMINECTOMY  10/23/1979   AVM   LAPAROSCOPIC SIGMOID COLECTOMY  02/22/2016   ROTATOR CUFF REPAIR  1996, 1997   left shoulder   ROTATOR CUFF REPAIR Right 2001   SHOULDER ARTHROSCOPY  2007   right   SQUAMOUS CELL CARCINOMA EXCISION Left 2018   leg   Toe Nails Removal Bilateral 1981   both great toes   TOTAL SHOULDER REPLACEMENT  2001   left   TRANSFER TENDON HAND  1982, 1983  to right hand X3   VENTRAL HERNIA REPAIR  08/17/2010   Postop ileus (Dr. Andrey Campanile)   Family History  Problem Relation Age of Onset   Cancer Mother        breast & melanoma   Hypertension Mother    Heart disease Father        CAD, MI, AFib   Cancer Sister        melanoma   Dementia Sister        Alzheimer's   Cancer Maternal Aunt        breast   Cancer Paternal Aunt        breast   Cancer Paternal Aunt        breast   Cancer Paternal Uncle        breast   Cancer Maternal Grandfather        melanoma   Stroke Paternal Grandmother    Heart disease Paternal Grandfather    Social History   Socioeconomic History   Marital status: Single    Spouse name: Not on file   Number of children: Not on file   Years of education: Not on file   Highest education level: Bachelor's degree (e.g., BA, AB, BS)  Occupational History   Occupation: Disabled    Comment: Editor, commissioning  Tobacco Use   Smoking status: Never   Smokeless tobacco: Never  Vaping Use   Vaping status: Never Used  Substance and Sexual Activity   Alcohol use: No   Drug use: No   Sexual activity: Not on file    Comment: lives with sister and mother, no major dietary restrictions  Other Topics Concern   Not on file  Social History Narrative   Sister is primary caregiver   Social Drivers of Health    Financial Resource Strain: Medium Risk (07/13/2023)   Overall Financial Resource Strain (CARDIA)    Difficulty of Paying Living Expenses: Somewhat hard  Food Insecurity: No Food Insecurity (07/13/2023)   Hunger Vital Sign    Worried About Running Out of Food in the Last Year: Never true    Ran Out of Food in the Last Year: Never true  Transportation Needs: No Transportation Needs (07/13/2023)   PRAPARE - Administrator, Civil Service (Medical): No    Lack of Transportation (Non-Medical): No  Physical Activity: Unknown (07/13/2023)   Exercise Vital Sign    Days of Exercise per Week: 0 days    Minutes of Exercise per Session: Not on file  Stress: No Stress Concern Present (07/13/2023)   [employer] of Occupational Health - Occupational Stress Questionnaire    Feeling of Stress : Only a little  Social Connections: Unknown (07/13/2023)   Social Connection and Isolation Panel [NHANES]    Frequency of Communication with Friends and Family: More than three times a week    Frequency of Social Gatherings with Friends and Family: More than three times a week    Attends Religious Services: Patient declined    Database administrator or Organizations: Yes    Attends Banker Meetings: Never    Marital Status: Never married    Tobacco Counseling Counseling given: Not Answered   Clinical Intake:  Pre-visit preparation completed: Yes  Pain : No/denies pain     BMI - recorded: 31.38 Nutritional Status: BMI > 30  Obese Nutritional Risks: None Diabetes: No  How often do you need to have someone help you when you read instructions, pamphlets, or other  written materials from your doctor or pharmacy?: 1 - Never  Interpreter Needed?: No  Information entered by :: Theresa Mulligan LPN   Activities of Daily Living    07/13/2023    2:04 PM 07/08/2023    9:04 PM  In your present state of health, do you have any difficulty performing the following activities:   Hearing? 0 0  Vision? 0 0  Difficulty concentrating or making decisions? 0 0  Walking or climbing stairs? 1 1  Dressing or bathing? 1 1  Doing errands, shopping? 1 1  Preparing Food and eating ? Malvin Johns  Comment Aide assist   Using the Toilet? Y Y  Comment Indwelling Cath. Followed by medical attention   In the past six months, have you accidently leaked urine? Y Y  Comment Indwelling Cath. Followed by medical attention   Do you have problems with loss of bowel control? N N  Managing your Medications? N N  Managing your Finances? N N  Housekeeping or managing your Housekeeping? Malvin Johns  Comment Aide assist     Patient Care Team: Bradd Canary, MD as PCP - General (Family Medicine) Bjorn Pippin, MD as Attending Physician (Urology) Ernesto Rutherford, MD as Consulting Physician (Ophthalmology) Elliot Dally, MD as Consulting Physician (Orthopedic Surgery) Durenda Hurt, MD as Consulting Physician (Surgical Oncology) Bernette Redbird, MD as Consulting Physician (Gastroenterology) Arminda Resides, MD as Consulting Physician (Dermatology)  Indicate any recent Medical Services you may have received from other than [facility] providers in the past year (date may be approximate).     Assessment:   This is a routine wellness examination for [facility].  Hearing/Vision screen Hearing Screening - Comments:: Denies hearing difficulties   Vision Screening - Comments:: Wears rx glasses - up to date with routine eye exams with  Dr Dione Booze   Goals Addressed               This Visit's Progress     Maintain current health (pt-stated)         Depression Screen    07/13/2023    2:02 PM 04/25/2022    1:53 PM 01/06/2022   11:37 AM 04/21/2021    1:08 PM 06/21/2016    4:11 PM  PHQ 2/9 Scores  PHQ - 2 Score 0 0 0 0 0  PHQ- 9 Score   0      Fall Risk    07/13/2023    2:06 PM 07/08/2023    9:04 PM 04/25/2022    1:52 PM 04/21/2021    1:06 PM 06/21/2016    4:11 PM  Fall Risk   Falls in the past year? 0 0 0 0  No  Number falls in past yr: 0 0 0 0   Injury with Fall? 0 0 0 0   Risk for fall due to : No Fall Risks  No Fall Risks    Follow up Falls prevention discussed  Falls evaluation completed Falls prevention discussed     MEDICARE RISK AT HOME: Medicare Risk at Home Any stairs in or around the home?: Yes If so, are there any without handrails?: No Home free of loose throw rugs in walkways, pet beds, electrical cords, etc?: No Adequate lighting in your home to reduce risk of falls?: Yes Life alert?: No Use of a cane, walker or w/c?: Yes Grab bars in the bathroom?: No Shower chair or bench in shower?: Yes Elevated toilet seat or a handicapped toilet?: No  TIMED UP AND GO:  Was  the test performed?  No    Cognitive Function:    06/21/2016    4:14 PM  MMSE - Mini Mental State Exam  Orientation to time 5  Orientation to Place 5  Registration 3  Attention/ Calculation 5  Recall 3  Language- name 2 objects 2  Language- repeat 1  Language- follow 3 step command 3  Language- read & follow direction 1  Write a sentence 1  Copy design 1  Total score 30        07/13/2023    2:14 PM 04/25/2022    2:00 PM  6CIT Screen  What Year? 0 points 0 points  What month? 0 points 3 points  What time? 0 points 0 points  Count back from 20 0 points 0 points  Months in reverse 0 points 0 points  Repeat phrase 0 points 0 points  Total Score 0 points 3 points    Immunizations Immunization History  Administered Date(s) Administered   Fluad Quad(high Dose 65+) 03/14/2019, 03/23/2021   Fluad Trivalent(High Dose 65+) 04/18/2023   H1N1 07/24/2008   Influenza Split 04/05/2011, 04/18/2012   Influenza Whole 06/03/2008, 03/31/2009, 04/01/2010   Influenza, High Dose Seasonal PF 02/19/2018   Influenza,inj,Quad PF,6+ Mos 03/20/2013, 03/03/2014, 05/18/2015, 04/20/2016, 04/06/2017   Moderna Sars-Covid-2 Vaccination 11/02/2019, 11/30/2019, 06/15/2020   PNEUMOCOCCAL CONJUGATE-20 04/18/2023    Pneumococcal Conjugate-13 06/24/2013   Pneumococcal Polysaccharide-23 12/07/2005, 03/21/2019   Pneumococcal-Unspecified 12/07/2005, 06/24/2013   Td 06/03/2008   Tdap 03/06/2019   Zoster Recombinant(Shingrix) 07/13/2018, 03/06/2019   Zoster, Live 11/26/2012    TDAP status: Up to date  Flu Vaccine status: Up to date  Pneumococcal vaccine status: Up to date  Covid-19 vaccine status: Declined, Education has been provided regarding the importance of this vaccine but patient still declined. Advised may receive this vaccine at local pharmacy or Health Dept.or vaccine clinic. Aware to provide a copy of the vaccination record if obtained from local pharmacy or Health Dept. Verbalized acceptance and understanding.  Qualifies for Shingles Vaccine? Yes   Zostavax completed Yes   Shingrix Completed?: Yes  Screening Tests Health Maintenance  Topic Date Due   FOOT EXAM  Never done   Fecal DNA (Cologuard)  05/23/2018   COVID-19 Vaccine (4 - 2024-25 season) 02/12/2023   HEMOGLOBIN A1C  10/16/2023   MAMMOGRAM  02/16/2024   OPHTHALMOLOGY EXAM  03/27/2024   Diabetic kidney evaluation - eGFR measurement  04/17/2024   Diabetic kidney evaluation - Urine ACR  04/17/2024   Medicare Annual Wellness (AWV)  07/12/2024   DTaP/Tdap/Td (3 - Td or Tdap) 03/05/2029   Pneumonia Vaccine 30+ Years old  Completed   INFLUENZA VACCINE  Completed   DEXA SCAN  Completed   Hepatitis C Screening  Completed   Zoster Vaccines- Shingrix  Completed   HPV VACCINES  Aged Out    Health Maintenance  Health Maintenance Due  Topic Date Due   FOOT EXAM  Never done   Fecal DNA (Cologuard)  05/23/2018   COVID-19 Vaccine (4 - 2024-25 season) 02/12/2023      Mammogram status: Completed 02/15/22. Repeat every year  Bone Density status: Completed 05/08/23. Results reflect: Bone density results: NORMAL. Repeat every   years.     Additional Screening:  Hepatitis C Screening: does qualify; Completed 05/18/15  Vision  Screening: Recommended annual ophthalmology exams for early detection of glaucoma and other disorders of the eye. Is the patient up to date with their annual eye exam?  Yes  Who is the  provider or what is the name of the office in which the patient attends annual eye exams? Dr Dione Booze If pt is not established with a provider, would they like to be referred to a provider to establish care? No .   Dental Screening: Recommended annual dental exams for proper oral hygiene   Community Resource Referral / Chronic Care Management:  CRR required this visit?  No   CCM required this visit?  No     Plan:     I have personally reviewed and noted the following in the patient's chart:   Medical and social history Use of alcohol, tobacco or illicit drugs  Current medications and supplements including opioid prescriptions. Patient is not currently taking opioid prescriptions. Functional ability and status Nutritional status Physical activity Advanced directives List of other physicians Hospitalizations, surgeries, and ER visits in previous 12 months Vitals Screenings to include cognitive, depression, and falls Referrals and appointments  In addition, I have reviewed and discussed with patient certain preventive protocols, quality metrics, and best practice recommendations. A written personalized care plan for preventive services as well as general preventive health recommendations were provided to patient.     Tillie Rung, LPN   [id number]   After Visit Summary: (MyChart) Due to this being a telephonic visit, the after visit summary with patients personalized plan was offered to patient via MyChart   Nurse Notes: None

## 2023-07-16 DIAGNOSIS — Z1211 Encounter for screening for malignant neoplasm of colon: Secondary | ICD-10-CM | POA: Diagnosis not present

## 2023-07-18 ENCOUNTER — Encounter: Payer: Self-pay | Admitting: Family Medicine

## 2023-07-22 LAB — COLOGUARD: COLOGUARD: NEGATIVE

## 2023-07-24 ENCOUNTER — Other Ambulatory Visit: Payer: Self-pay | Admitting: Family Medicine

## 2023-07-26 DIAGNOSIS — R32 Unspecified urinary incontinence: Secondary | ICD-10-CM | POA: Diagnosis not present

## 2023-07-26 DIAGNOSIS — G8250 Quadriplegia, unspecified: Secondary | ICD-10-CM | POA: Diagnosis not present

## 2023-07-26 DIAGNOSIS — Z933 Colostomy status: Secondary | ICD-10-CM | POA: Diagnosis not present

## 2023-07-31 ENCOUNTER — Other Ambulatory Visit: Payer: Self-pay | Admitting: Family

## 2023-08-01 ENCOUNTER — Other Ambulatory Visit: Payer: Self-pay | Admitting: Emergency Medicine

## 2023-08-01 ENCOUNTER — Telehealth: Payer: Self-pay | Admitting: Neurology

## 2023-08-01 MED ORDER — BACLOFEN 20 MG PO TABS: 20.0000 mg | ORAL_TABLET | Freq: Two times a day (BID) | ORAL | 0 refills | Status: DC

## 2023-08-01 NOTE — Telephone Encounter (Signed)
Called and spoke with patient and let her know the Baclofen has been refilled.

## 2023-08-01 NOTE — Telephone Encounter (Signed)
Copied from CRM [id number]. Topic: Clinical - Prescription Issue >> Jul 31, 2023  3:48 PM [name] wrote: Reason for CRM: Pt calling about her request sent today for BACLOFEN 20MG . Advised of processing time for refills. She says she only has a day and a half left of the medication and asking if this can be expedited and filled today or tomorrow. Please follow up.

## 2023-09-04 DIAGNOSIS — M25519 Pain in unspecified shoulder: Secondary | ICD-10-CM | POA: Diagnosis not present

## 2023-09-04 DIAGNOSIS — I10 Essential (primary) hypertension: Secondary | ICD-10-CM | POA: Diagnosis not present

## 2023-09-04 DIAGNOSIS — G8254 Quadriplegia, C5-C7 incomplete: Secondary | ICD-10-CM | POA: Diagnosis not present

## 2023-09-04 DIAGNOSIS — D649 Anemia, unspecified: Secondary | ICD-10-CM | POA: Diagnosis not present

## 2023-09-04 DIAGNOSIS — E669 Obesity, unspecified: Secondary | ICD-10-CM | POA: Diagnosis not present

## 2023-09-04 DIAGNOSIS — J209 Acute bronchitis, unspecified: Secondary | ICD-10-CM | POA: Diagnosis not present

## 2023-09-04 DIAGNOSIS — E785 Hyperlipidemia, unspecified: Secondary | ICD-10-CM | POA: Diagnosis not present

## 2023-09-04 DIAGNOSIS — M1990 Unspecified osteoarthritis, unspecified site: Secondary | ICD-10-CM | POA: Diagnosis not present

## 2023-09-06 DIAGNOSIS — R32 Unspecified urinary incontinence: Secondary | ICD-10-CM | POA: Diagnosis not present

## 2023-09-06 DIAGNOSIS — N310 Uninhibited neuropathic bladder, not elsewhere classified: Secondary | ICD-10-CM | POA: Diagnosis not present

## 2023-09-06 DIAGNOSIS — Z933 Colostomy status: Secondary | ICD-10-CM | POA: Diagnosis not present

## 2023-09-06 DIAGNOSIS — N3020 Other chronic cystitis without hematuria: Secondary | ICD-10-CM | POA: Diagnosis not present

## 2023-09-06 DIAGNOSIS — G8250 Quadriplegia, unspecified: Secondary | ICD-10-CM | POA: Diagnosis not present

## 2023-09-08 LAB — HM PAP SMEAR: HM Pap smear: NEGATIVE

## 2023-09-21 LAB — HM MAMMOGRAPHY

## 2023-09-22 DIAGNOSIS — R922 Inconclusive mammogram: Secondary | ICD-10-CM | POA: Diagnosis not present

## 2023-09-22 DIAGNOSIS — N6322 Unspecified lump in the left breast, upper inner quadrant: Secondary | ICD-10-CM | POA: Diagnosis not present

## 2023-09-22 LAB — HM MAMMOGRAPHY

## 2023-09-24 NOTE — Assessment & Plan Note (Signed)
 Encouraged heart healthy diet, increase exercise, avoid trans fats, consider a krill oil cap daily

## 2023-09-24 NOTE — Assessment & Plan Note (Signed)
 [id number] acceptable, minimize simple carbs. Increase exercise as tolerated.

## 2023-09-24 NOTE — Assessment & Plan Note (Signed)
 Well controlled, no changes to meds. Encouraged heart healthy diet such as the DASH diet and exercise as tolerated.

## 2023-09-25 ENCOUNTER — Encounter: Payer: Self-pay | Admitting: Family Medicine

## 2023-09-25 ENCOUNTER — Ambulatory Visit (INDEPENDENT_AMBULATORY_CARE_PROVIDER_SITE_OTHER): Admitting: Family Medicine

## 2023-09-25 VITALS — BP 159/73 | HR 54 | Temp 97.5°F | Resp 16 | Ht 71.0 in | Wt 221.0 lb

## 2023-09-25 DIAGNOSIS — I10 Essential (primary) hypertension: Secondary | ICD-10-CM

## 2023-09-25 DIAGNOSIS — E782 Mixed hyperlipidemia: Secondary | ICD-10-CM

## 2023-09-25 DIAGNOSIS — R739 Hyperglycemia, unspecified: Secondary | ICD-10-CM | POA: Diagnosis not present

## 2023-09-25 LAB — COMPREHENSIVE METABOLIC PANEL WITH GFR
ALT: 17 U/L (ref 0–35)
AST: 15 U/L (ref 0–37)
Albumin: 4.3 g/dL (ref 3.5–5.2)
Alkaline Phosphatase: 97 U/L (ref 39–117)
BUN: 6 mg/dL (ref 6–23)
CO2: 26 meq/L (ref 19–32)
Calcium: 9.6 mg/dL (ref 8.4–10.5)
Chloride: 100 meq/L (ref 96–112)
Creatinine, Ser: 0.35 mg/dL — ABNORMAL LOW (ref 0.40–1.20)
GFR: 103.21 mL/min (ref >60.00)
Glucose, Bld: 126 mg/dL — ABNORMAL HIGH (ref 70–99)
Potassium: 3.9 meq/L (ref 3.5–5.1)
Sodium: 136 meq/L (ref 135–145)
Total Bilirubin: 0.6 mg/dL (ref 0.2–1.2)
Total Protein: 7 g/dL (ref 6.0–8.3)

## 2023-09-25 LAB — CBC WITH DIFFERENTIAL/PLATELET
Basophils Absolute: 0.1 10*3/uL (ref 0.0–0.1)
Basophils Relative: 1.2 % (ref 0.0–3.0)
Eosinophils Absolute: 0.2 10*3/uL (ref 0.0–0.7)
Eosinophils Relative: 2.4 % (ref 0.0–5.0)
HCT: 41.9 % (ref 36.0–46.0)
Hemoglobin: 14.1 g/dL (ref 12.0–15.0)
Lymphocytes Relative: 30.9 % (ref 12.0–46.0)
Lymphs Abs: 2.1 10*3/uL (ref 0.7–4.0)
MCHC: 33.6 g/dL (ref 30.0–36.0)
MCV: 80.7 fl (ref 78.0–100.0)
Monocytes Absolute: 0.4 10*3/uL (ref 0.1–1.0)
Monocytes Relative: 5.8 % (ref 3.0–12.0)
Neutro Abs: 4.1 10*3/uL (ref 1.4–7.7)
Neutrophils Relative %: 59.7 % (ref 43.0–77.0)
Platelets: 232 10*3/uL (ref 150.0–400.0)
RBC: 5.2 Mil/uL — ABNORMAL HIGH (ref 3.87–5.11)
RDW: 14.3 % (ref 11.5–15.5)
WBC: 6.9 10*3/uL (ref 4.0–10.5)

## 2023-09-25 LAB — LIPID PANEL
Cholesterol: 149 mg/dL (ref 0–200)
HDL: 41.2 mg/dL (ref >39.00)
LDL Cholesterol: 78 mg/dL (ref 0–99)
NonHDL: 107.99
Total CHOL/HDL Ratio: 4
Triglycerides: 152 mg/dL — ABNORMAL HIGH (ref 0.0–149.0)
VLDL: 30.4 mg/dL (ref 0.0–40.0)

## 2023-09-25 LAB — TSH: TSH: 3.07 u[IU]/mL (ref 0.35–5.50)

## 2023-09-25 LAB — HEMOGLOBIN A1C: Hgb A1c MFr Bld: 7.3 % — ABNORMAL HIGH (ref 4.6–6.5)

## 2023-09-25 MED ORDER — HYDROCHLOROTHIAZIDE 12.5 MG PO CAPS: 12.5000 mg | ORAL_CAPSULE | Freq: Every day | ORAL | 1 refills | Status: DC

## 2023-09-25 MED ORDER — ROSUVASTATIN CALCIUM 5 MG PO TABS: 5.0000 mg | ORAL_TABLET | Freq: Every day | ORAL | 3 refills | Status: AC

## 2023-09-25 MED ORDER — LISINOPRIL 5 MG PO TABS: 5.0000 mg | ORAL_TABLET | Freq: Every day | ORAL | 0 refills | Status: DC

## 2023-09-25 MED ORDER — BACLOFEN 20 MG PO TABS: 20.0000 mg | ORAL_TABLET | Freq: Two times a day (BID) | ORAL | 0 refills | Status: DC

## 2023-09-25 NOTE — Patient Instructions (Signed)
 Hypertension, Adult High blood pressure (hypertension) is when the force of blood pumping through the arteries is too strong. The arteries are the blood vessels that carry blood from the heart throughout the body. Hypertension forces the heart to work harder to pump blood and may cause arteries to become narrow or stiff. Untreated or uncontrolled hypertension can lead to a heart attack, heart failure, a stroke, kidney disease, and other problems. A blood pressure reading consists of a higher number over a lower number. Ideally, your blood pressure should be below 120/80. The first ("top") number is called the systolic pressure. It is a measure of the pressure in your arteries as your heart beats. The second ("bottom") number is called the diastolic pressure. It is a measure of the pressure in your arteries as the heart relaxes. What are the causes? The exact cause of this condition is not known. There are some conditions that result in high blood pressure. What increases the risk? Certain factors may make you more likely to develop high blood pressure. Some of these risk factors are under your control, including: Smoking. Not getting enough exercise or physical activity. Being overweight. Having too much fat, sugar, calories, or salt (sodium) in your diet. Drinking too much alcohol. Other risk factors include: Having a personal history of heart disease, diabetes, high cholesterol, or kidney disease. Stress. Having a family history of high blood pressure and high cholesterol. Having obstructive sleep apnea. Age. The risk increases with age. What are the signs or symptoms? High blood pressure may not cause symptoms. Very high blood pressure (hypertensive crisis) may cause: Headache. Fast or irregular heartbeats (palpitations). Shortness of breath. Nosebleed. Nausea and vomiting. Vision changes. Severe chest pain, dizziness, and seizures. How is this diagnosed? This condition is diagnosed by  measuring your blood pressure while you are seated, with your arm resting on a flat surface, your legs uncrossed, and your feet flat on the floor. The cuff of the blood pressure monitor will be placed directly against the skin of your upper arm at the level of your heart. Blood pressure should be measured at least twice using the same arm. Certain conditions can cause a difference in blood pressure between your right and left arms. If you have a high blood pressure reading during one visit or you have normal blood pressure with other risk factors, you may be asked to: Return on a different day to have your blood pressure checked again. Monitor your blood pressure at home for 1 week or longer. If you are diagnosed with hypertension, you may have other blood or imaging tests to help your health care provider understand your overall risk for other conditions. How is this treated? This condition is treated by making healthy lifestyle changes, such as eating healthy foods, exercising more, and reducing your alcohol intake. You may be referred for counseling on a healthy diet and physical activity. Your health care provider may prescribe medicine if lifestyle changes are not enough to get your blood pressure under control and if: Your systolic blood pressure is above 130. Your diastolic blood pressure is above 80. Your personal target blood pressure may vary depending on your medical conditions, your age, and other factors. Follow these instructions at home: Eating and drinking  Eat a diet that is high in fiber and potassium, and low in sodium, added sugar, and fat. An example of this eating plan is called the DASH diet. DASH stands for Dietary Approaches to Stop Hypertension. To eat this way: Eat  plenty of fresh fruits and vegetables. Try to fill one half of your plate at each meal with fruits and vegetables. Eat whole grains, such as whole-wheat pasta, brown rice, or whole-grain bread. Fill about one  fourth of your plate with whole grains. Eat or drink low-fat dairy products, such as skim milk or low-fat yogurt. Avoid fatty cuts of meat, processed or cured meats, and poultry with skin. Fill about one fourth of your plate with lean proteins, such as fish, chicken without skin, beans, eggs, or tofu. Avoid pre-made and processed foods. These tend to be higher in sodium, added sugar, and fat. Reduce your daily sodium intake. Many people with hypertension should eat less than 1,500 mg of sodium a day. Do not drink alcohol if: Your health care provider tells you not to drink. You are pregnant, may be pregnant, or are planning to become pregnant. If you drink alcohol: Limit how much you have to: 0-1 drink a day for women. 0-2 drinks a day for men. Know how much alcohol is in your drink. In the [place] one drink equals one 12 oz bottle of beer (355 mL), one 5 oz glass of wine (148 mL), or one 1 oz glass of hard liquor (44 mL). Lifestyle  Work with your health care provider to maintain a healthy body weight or to lose weight. Ask what an ideal weight is for you. Get at least 30 minutes of exercise that causes your heart to beat faster (aerobic exercise) most days of the week. Activities may include walking, swimming, or biking. Include exercise to strengthen your muscles (resistance exercise), such as Pilates or lifting weights, as part of your weekly exercise routine. Try to do these types of exercises for 30 minutes at least 3 days a week. Do not use any products that contain nicotine or tobacco. These products include cigarettes, chewing tobacco, and vaping devices, such as e-cigarettes. If you need help quitting, ask your health care provider. Monitor your blood pressure at home as told by your health care provider. Keep all follow-up visits. This is important. Medicines Take over-the-counter and prescription medicines only as told by your health care provider. Follow directions carefully. Blood  pressure medicines must be taken as prescribed. Do not skip doses of blood pressure medicine. Doing this puts you at risk for problems and can make the medicine less effective. Ask your health care provider about side effects or reactions to medicines that you should watch for. Contact a health care provider if you: Think you are having a reaction to a medicine you are taking. Have headaches that keep coming back (recurring). Feel dizzy. Have swelling in your ankles. Have trouble with your vision. Get help right away if you: Develop a severe headache or confusion. Have unusual weakness or numbness. Feel faint. Have severe pain in your chest or abdomen. Vomit repeatedly. Have trouble breathing. These symptoms may be an emergency. Get help right away. Call 911. Do not wait to see if the symptoms will go away. Do not drive yourself to the hospital. Summary Hypertension is when the force of blood pumping through your arteries is too strong. If this condition is not controlled, it may put you at risk for serious complications. Your personal target blood pressure may vary depending on your medical conditions, your age, and other factors. For most people, a normal blood pressure is less than 120/80. Hypertension is treated with lifestyle changes, medicines, or a combination of both. Lifestyle changes include losing weight, eating a healthy,  low-sodium diet, exercising more, and limiting alcohol. This information is not intended to replace advice given to you by your health care provider. Make sure you discuss any questions you have with your health care provider. Document Revised: 04/06/2021 Document Reviewed: 04/06/2021 Elsevier Patient Education  2024 [employer].

## 2023-09-25 NOTE — Progress Notes (Signed)
 Subjective:    Patient ID: [name], female    DOB: 10-11-1950, 73 y.o.   MRN: [id number]  Chief Complaint  Patient presents with   Follow-up    HPI Discussed the use of AI scribe software for clinical note transcription with the patient, who gave verbal consent to proceed.  History of Present Illness [name] is a 73 year old female with hypertension who presents with concerns about catheter blockage and blood pressure management.  She has been experiencing issues with her urinary catheter for the past three to four months, describing thick mucus-like strands resembling 'worms' that cause frequent blockages and require weekly catheter changes. This has led to urine leakage around the catheter, resulting in wet clothes. She has a history of proteus colonization but no recent infections were detected by her urologist. She questions whether the change from calcium carbonate to calcium citrate, which she started in February, could be contributing to this issue. She takes 1465 mg of calcium daily, including calcium citrate, and 1500 IU of vitamin D3. She has a history of bladder incrustation but notes that her bladder appears better than normal according to her urologist. No fevers or chills, but she experiences burning when the catheter is blocked.  She has a history of hypertension and is currently on hydrochlorothiazide 12.5 mg and lisinopril 5 mg once daily. She reports occasional lightheadedness after eating but no headaches or chest pain. Her blood pressure readings have been high during recent visits. She mentions a possible cough at night, which she attributes to a habit rather than medication.  She maintains a hydration level of approximately 4.875 quarts of liquid daily, mostly non-caffeinated and non-alcoholic beverages. She is mindful of her diet and engages in activities to mentally challenge herself. Her family history includes dementia, which she is concerned about  developing.    Past Medical History:  Diagnosis Date   Acute bronchitis 05/16/2015   Anemia    after shoulder replacement   Autonomic dysreflexia    AVM (arteriovenous malformation) spine 1981   Bowel dysfunction    constipation and fecal retention   Cerumen impaction 10/11/2015   Complication of anesthesia    low temperature after general anesthesia for colostomy   Constipation 01/25/2016   Contusion 05/18/2014   Of foot   Depression 02/13/2017   History of    Headache 04/09/2017   History of kidney stones    History of neurogenic bowel    History of rotator cuff tear    Right   History of ventral hernia    Hyperglycemia 05/18/2015   Hyperlipidemia    Hypertension    No medications at this time   Incomplete quadriplegia at C5-6 level (HCC) 07/24/2008   Qualifier: Diagnosis of  By: Nena Jordan Requires bowel regimen including enemas and indwelling foley cath Has had tendency towards pressure sores in past. Had a tear with a transfer in Hospital in past for surgery and developed a sore at that time.    Low blood pressure reading    Occassionally   Medicare annual wellness visit, subsequent 11/02/2014   Follows with [facility] Declines colonoscopy  No h/o abnormal pap, declines further paps   MVP (mitral valve prolapse) 12/01/2012   Mild   Neurogenic bladder    OA (osteoarthritis)    Hip and shoulders, neck   Obesity 11/01/2016   OSA on CPAP 01/31/2016   Pain in joint, shoulder region 12/01/2012   Long history B/l Follows  with Dr Gaylyn Keas at [facility]   Postmenopausal bleeding 12/17/2012   Preventative health care 06/26/2013   Quadriplegia (HCC) 1981   Secondary to ruptured AVM C5-C6   Sacral pressure ulcer 12/01/2012   H/o Follows with Dr Darold Elk of Dermatology   Skin cancer 11/01/2016   2 BCC RLE excised by Dr Marcia Setters 2018 1SCC LLE excised by Dr Marcia Setters 2018   Vaginitis and vulvovaginitis 12/17/2012   Wears glasses     Past Surgical History:   Procedure Laterality Date   ANAL TAG REMOVAL  2014   BASAL CELL CARCINOMA EXCISION  2016   Lip   BASAL CELL CARCINOMA EXCISION  2018   right leg x2   BASAL CELL CARCINOMA EXCISION Left 2018   leg, chest   BASAL CELL CARCINOMA EXCISION Right 2019   leg   BLADDER STONE REMOVAL  1986/87   COLONOSCOPY  02/22/2016   COLOSTOMY  02/22/2016   CYSTOSCOPY WITH BIOPSY N/A 06/21/2018   Procedure: CYSTOSCOPY WITH BIOPSY FULGURATION;  Surgeon: Homero Luster, MD;  Location: [facility];  Service: Urology;  Laterality: N/A;   CYSTOSCOPY WITH LITHOLAPAXY N/A 06/21/2018   Procedure: CYSTOSCOPY WITH LITHOLAPAXY;  Surgeon: Homero Luster, MD;  [facility];  Service: Urology;  Laterality: N/A;   DORSAL CHEILECTOMY-HALLUX RIGIDUS Bilateral 2011   HERNIA REPAIR  2012   VENTRAL umbilical hernia repair   LAMINECTOMY  10/23/1979   AVM   LAPAROSCOPIC SIGMOID COLECTOMY  02/22/2016   ROTATOR CUFF REPAIR  1996, 1997   left shoulder   ROTATOR CUFF REPAIR Right 2001   SHOULDER ARTHROSCOPY  2007   right   SQUAMOUS CELL CARCINOMA EXCISION Left 2018   leg   Toe Nails Removal Bilateral 1981   both great toes   TOTAL SHOULDER REPLACEMENT  2001   left   TRANSFER TENDON HAND  1982, 1983   to right hand X3   VENTRAL HERNIA REPAIR  08/17/2010   Postop ileus (Dr. Elvan Hamel)    Family History  Problem Relation Age of Onset   Cancer Mother        breast & melanoma   Hypertension Mother    Heart disease Father        CAD, MI, AFib   Cancer Sister        melanoma   Dementia Sister        Alzheimer's   Cancer Maternal Aunt        breast   Cancer Paternal Aunt        breast   Cancer Paternal Aunt        breast   Cancer Paternal Uncle        breast   Cancer Maternal Grandfather        melanoma   Stroke Paternal Grandmother    Heart disease Paternal Grandfather     Social History   Socioeconomic History   Marital status: Single    Spouse name: Not on file   Number of  children: Not on file   Years of education: Not on file   Highest education level: Bachelor's degree (e.g., BA, AB, BS)  Occupational History   Occupation: Disabled    Comment: Editor, commissioning  Tobacco Use   Smoking status: Never   Smokeless tobacco: Never  Vaping Use   Vaping status: Never Used  Substance and Sexual Activity   Alcohol use: No   Drug use: No   Sexual activity: Not on  file    Comment: lives with sister and mother, no major dietary restrictions  Other Topics Concern   Not on file  Social History Narrative   Sister is primary caregiver   Social Drivers of Health   Financial Resource Strain: Medium Risk (07/13/2023)   Overall Financial Resource Strain (CARDIA)    Difficulty of Paying Living Expenses: Somewhat hard  Food Insecurity: No Food Insecurity (07/13/2023)   Hunger Vital Sign    Worried About Running Out of Food in the Last Year: Never true    Ran Out of Food in the Last Year: Never true  Transportation Needs: No Transportation Needs (07/13/2023)   PRAPARE - Administrator, Civil Service (Medical): No    Lack of Transportation (Non-Medical): No  Physical Activity: Unknown (07/13/2023)   Exercise Vital Sign    Days of Exercise per Week: 0 days    Minutes of Exercise per Session: Not on file  Stress: No Stress Concern Present (07/13/2023)   [employer] of Occupational Health - Occupational Stress Questionnaire    Feeling of Stress : Only a little  Social Connections: Unknown (07/13/2023)   Social Connection and Isolation Panel [NHANES]    Frequency of Communication with Friends and Family: More than three times a week    Frequency of Social Gatherings with Friends and Family: More than three times a week    Attends Religious Services: Patient declined    Database administrator or Organizations: Yes    Attends Banker Meetings: Never    Marital Status: Never married  Intimate Partner Violence: Not At Risk (07/13/2023)    Humiliation, Afraid, Rape, and Kick questionnaire    Fear of Current or Ex-Partner: No    Emotionally Abused: No    Physically Abused: No    Sexually Abused: No    Outpatient Medications Prior to Visit  Medication Sig Dispense Refill   clobetasol (TEMOVATE) 0.05 % external solution Apply 1 application topically every Monday, Wednesday, and Friday. Use on scalp 3 times per week     docusate sodium (COLACE) 100 MG capsule Take 1 capsule (100 mg total) by mouth daily. 90 capsule 2   ELDERBERRY PO Take 2 each by mouth daily.     FIBER PO Take 1 capsule by mouth daily.     influenza vaccine adjuvanted (FLUAD) 0.5 ML injection Inject into the muscle. 0.5 mL 0   Multiple Vitamins-Calcium (ONE-A-DAY WOMENS FORMULA PO) Take 1 tablet by mouth daily.     OXYBUTYNIN CHLORIDE ER PO Take 10 mg by mouth at bedtime.     Probiotic Product (DIGESTIVE ADVANTAGE) CAPS Take 1 tablet by mouth daily.     baclofen (LIORESAL) 20 MG tablet Take 1 tablet (20 mg total) by mouth 2 (two) times daily. 180 tablet 0   hydrochlorothiazide (MICROZIDE) 12.5 MG capsule Take 1 capsule (12.5 mg total) by mouth daily. 90 capsule 1   lisinopril (ZESTRIL) 5 MG tablet TAKE 1 TABLET BY MOUTH DAILY 90 tablet 0   rosuvastatin (CRESTOR) 5 MG tablet TAKE 1 TABLET BY MOUTH DAILY 90 tablet 3   No facility-administered medications prior to visit.    Allergies  Allergen Reactions   Adhesive [Tape] Other (See Comments)    Skin irritation Does not tolerate [employer], tolerates [employer]    Review of Systems  Constitutional:  Negative for fever and malaise/fatigue.  HENT:  Negative for congestion.   Eyes:  Negative for blurred vision.  Respiratory:  Negative for shortness  of breath.   Cardiovascular:  Negative for chest pain, palpitations and leg swelling.  Gastrointestinal:  Negative for abdominal pain, blood in stool and nausea.  Genitourinary:  Negative for dysuria and frequency.  Musculoskeletal:  Negative for falls.  Skin:   Negative for rash.  Neurological:  Positive for focal weakness. Negative for dizziness, loss of consciousness and headaches.  Endo/Heme/Allergies:  Negative for environmental allergies.  Psychiatric/Behavioral:  Negative for depression. The patient is not nervous/anxious.        Objective:    Physical Exam Constitutional:      General: She is not in acute distress.    Appearance: Normal appearance. She is well-developed. She is not toxic-appearing.     Comments: In wheelchair  HENT:     Head: Normocephalic and atraumatic.     Right Ear: External ear normal.     Left Ear: External ear normal.     Nose: Nose normal.  Eyes:     General:        Right eye: No discharge.        Left eye: No discharge.     Conjunctiva/sclera: Conjunctivae normal.  Neck:     Thyroid: No thyromegaly.  Cardiovascular:     Rate and Rhythm: Normal rate and regular rhythm.     Heart sounds: Normal heart sounds. No murmur heard. Pulmonary:     Effort: Pulmonary effort is normal. No respiratory distress.     Breath sounds: Normal breath sounds.  Abdominal:     General: Bowel sounds are normal.     Palpations: Abdomen is soft.     Tenderness: There is no abdominal tenderness. There is no guarding.  Musculoskeletal:        General: Normal range of motion.     Cervical back: Neck supple.  Lymphadenopathy:     Cervical: No cervical adenopathy.  Skin:    General: Skin is warm and dry.  Neurological:     Mental Status: She is alert and oriented to person, place, and time.     Cranial Nerves: No cranial nerve deficit.     Sensory: Sensory deficit present.     Motor: Weakness present.     Coordination: Coordination abnormal.     Gait: Gait abnormal.     Deep Tendon Reflexes: Reflexes abnormal.  Psychiatric:        Mood and Affect: Mood normal.        Behavior: Behavior normal.        Thought Content: Thought content normal.        Judgment: Judgment normal.     BP (!) 159/73 (BP Location: Right  Arm, Patient Position: Sitting, Cuff Size: Normal)   Pulse (!) 54   Temp (!) 97.5 F (36.4 C) (Oral)   Resp 16   Ht 5\' 11"  (1.803 m)   Wt 221 lb (100.2 kg) Comment: Pt stated  SpO2 100%   BMI 30.82 kg/m  Wt Readings from Last 3 Encounters:  09/25/23 221 lb (100.2 kg)  07/13/23 225 lb (102.1 kg)  02/06/23 225 lb (102.1 kg)    Diabetic Foot Exam - Simple   No data filed    Lab Results  Component Value Date   WBC 6.9 09/25/2023   HGB 14.1 09/25/2023   HCT 41.9 09/25/2023   PLT 232.0 09/25/2023   GLUCOSE 126 (H) 09/25/2023   CHOL 149 09/25/2023   TRIG 152.0 (H) 09/25/2023   HDL 41.20 09/25/2023   LDLDIRECT 125.0 06/25/2018   LDLCALC  78 09/25/2023   ALT 17 09/25/2023   AST 15 09/25/2023   NA 136 09/25/2023   K 3.9 09/25/2023   CL 100 09/25/2023   CREATININE 0.35 (L) 09/25/2023   BUN 6 09/25/2023   CO2 26 09/25/2023   TSH 3.07 09/25/2023   HGBA1C 7.3 (H) 09/25/2023   MICROALBUR 1.3 04/18/2023    Lab Results  Component Value Date   TSH 3.07 09/25/2023   Lab Results  Component Value Date   WBC 6.9 09/25/2023   HGB 14.1 09/25/2023   HCT 41.9 09/25/2023   MCV 80.7 09/25/2023   PLT 232.0 09/25/2023   Lab Results  Component Value Date   NA 136 09/25/2023   K 3.9 09/25/2023   CO2 26 09/25/2023   GLUCOSE 126 (H) 09/25/2023   BUN 6 09/25/2023   CREATININE 0.35 (L) 09/25/2023   BILITOT 0.6 09/25/2023   ALKPHOS 97 09/25/2023   AST 15 09/25/2023   ALT 17 09/25/2023   PROT 7.0 09/25/2023   ALBUMIN 4.3 09/25/2023   CALCIUM 9.6 09/25/2023   ANIONGAP 6 06/21/2018   GFR 103.21 09/25/2023   Lab Results  Component Value Date   CHOL 149 09/25/2023   Lab Results  Component Value Date   HDL 41.20 09/25/2023   Lab Results  Component Value Date   LDLCALC 78 09/25/2023   Lab Results  Component Value Date   TRIG 152.0 (H) 09/25/2023   Lab Results  Component Value Date   CHOLHDL 4 09/25/2023   Lab Results  Component Value Date   HGBA1C 7.3 (H)  09/25/2023       Assessment & Plan:  Essential hypertension Assessment & Plan: Well controlled, no changes to meds. Encouraged heart healthy diet such as the DASH diet and exercise as tolerated.   Orders: -     Comprehensive metabolic panel with GFR -     CBC with Differential/Platelet -     TSH  Hyperglycemia Assessment & Plan: hgba1c acceptable, minimize simple carbs. Increase exercise as tolerated.   Orders: -     Hemoglobin A1c  Mixed hyperlipidemia Assessment & Plan: Encouraged heart healthy diet, increase exercise, avoid trans fats, consider a krill oil cap daily  Orders: -     Lipid panel  Other orders -     Rosuvastatin Calcium; Take 1 tablet (5 mg total) by mouth daily.  Dispense: 90 tablet; Refill: 3 -     Lisinopril; Take 1 tablet (5 mg total) by mouth daily.  Dispense: 90 tablet; Refill: 0 -     hydroCHLOROthiazide; Take 1 capsule (12.5 mg total) by mouth daily.  Dispense: 90 capsule; Refill: 1 -     Baclofen; Take 1 tablet (20 mg total) by mouth 2 (two) times daily.  Dispense: 180 tablet; Refill: 0    Assessment and Plan Assessment & Plan Catheter-related complications Biofilm obstructs catheter, requiring weekly changes. Urologist noted bacterial involvement without infection. Calcium citrate may contribute to biofilm formation. - Hold calcium citrate for a few weeks to observe changes. - Consider switching to calcium carbonate if symptoms improve. - Obtain recent urology notes and urine analysis results.  Hypertension Blood pressure elevated at 132/78 mmHg. Experiences postprandial lightheadedness. Currently on low-dose hydrochlorothiazide and lisinopril. Discussed lisinopril side effects and alternatives. - Recheck blood pressure. - Review lab work: cholesterol, metabolic panel, A1c, thyroid, and CBC. - Consider increasing lisinopril to 5 mg twice daily if blood pressure remains elevated.  Dementia risk Concerned about dementia due to family history.  Discussed proactive measures: mental stimulation, social engagement, and dietary considerations. - Encourage mental challenges such as learning new skills or engaging in puzzles. - Advise on a Mediterranean diet, fish oil, and multivitamin supplementation. - Ensure adequate hydration.  General Health Maintenance Maintaining general health with regular screenings and lifestyle modifications. - Continue regular screenings as per schedule. - Maintain current hydration levels. - Follow a balanced diet.     Randie Bustle, MD

## 2023-09-27 ENCOUNTER — Other Ambulatory Visit: Payer: Self-pay | Admitting: Family Medicine

## 2023-09-27 DIAGNOSIS — G4733 Obstructive sleep apnea (adult) (pediatric): Secondary | ICD-10-CM | POA: Diagnosis not present

## 2023-09-27 MED ORDER — OZEMPIC (0.25 OR 0.5 MG/DOSE) 2 MG/3ML ~~LOC~~ SOPN: 0.2500 mg | PEN_INJECTOR | SUBCUTANEOUS | 1 refills | Status: DC

## 2023-09-27 NOTE — Telephone Encounter (Signed)
 Copied from CRM [id number]. Topic: Clinical - Lab/Test Results >> Sep 27, 2023  9:16 AM Adaysia C wrote: Reason for CRM: Patient has a missed call regarding her lab results; patient has requested a call back to discuss her lab results; please follow up with patient [phone]

## 2023-10-04 ENCOUNTER — Telehealth: Payer: Self-pay | Admitting: *Deleted

## 2023-10-04 NOTE — Telephone Encounter (Signed)
 Copied from CRM [id number]. Topic: General - Other >> Oct 04, 2023 10:20 AM Oddis Bench wrote: Reason for CRM: Patient is calling to get the collection samples tools for urine and stool samples, explained to her that if the provider requires those samples they will provide the kits for the samples, reached out the office for further assistance. Will need to have them ordered. Informed will give a call when it is ready.

## 2023-10-04 NOTE — Telephone Encounter (Signed)
 Spoke with patient.  I saw about the urine in your last visit that we can order.  She also stated that perhaps there could maybe be something with her digestive system that could be cause the film in her urine catheter and maybe her stool needed to be tested.  If you recommend stool to be tested please let me know what orders to place.   Also message sent about ozempic  in mychart.

## 2023-10-05 ENCOUNTER — Emergency Department (HOSPITAL_BASED_OUTPATIENT_CLINIC_OR_DEPARTMENT_OTHER)

## 2023-10-05 ENCOUNTER — Emergency Department (HOSPITAL_BASED_OUTPATIENT_CLINIC_OR_DEPARTMENT_OTHER)
Admission: EM | Admit: 2023-10-05 | Discharge: 2023-10-05 | Disposition: A | Discharge status: Home / Self Care | Attending: Emergency Medicine | Admitting: Emergency Medicine

## 2023-10-05 ENCOUNTER — Other Ambulatory Visit (HOSPITAL_BASED_OUTPATIENT_CLINIC_OR_DEPARTMENT_OTHER): Payer: Self-pay

## 2023-10-05 ENCOUNTER — Encounter (HOSPITAL_BASED_OUTPATIENT_CLINIC_OR_DEPARTMENT_OTHER): Payer: Self-pay | Admitting: Emergency Medicine

## 2023-10-05 ENCOUNTER — Ambulatory Visit: Payer: Medicare PPO | Admitting: Family Medicine

## 2023-10-05 ENCOUNTER — Other Ambulatory Visit: Payer: Self-pay

## 2023-10-05 DIAGNOSIS — R109 Unspecified abdominal pain: Secondary | ICD-10-CM | POA: Diagnosis not present

## 2023-10-05 DIAGNOSIS — G8250 Quadriplegia, unspecified: Secondary | ICD-10-CM | POA: Insufficient documentation

## 2023-10-05 DIAGNOSIS — N390 Urinary tract infection, site not specified: Secondary | ICD-10-CM

## 2023-10-05 DIAGNOSIS — Z79899 Other long term (current) drug therapy: Secondary | ICD-10-CM | POA: Diagnosis not present

## 2023-10-05 DIAGNOSIS — Z933 Colostomy status: Secondary | ICD-10-CM | POA: Diagnosis not present

## 2023-10-05 DIAGNOSIS — I10 Essential (primary) hypertension: Secondary | ICD-10-CM | POA: Diagnosis not present

## 2023-10-05 DIAGNOSIS — R1031 Right lower quadrant pain: Secondary | ICD-10-CM | POA: Insufficient documentation

## 2023-10-05 LAB — URINALYSIS, ROUTINE W REFLEX MICROSCOPIC
Bilirubin Urine: NEGATIVE
Glucose, UA: NEGATIVE mg/dL
Ketones, ur: NEGATIVE mg/dL
Nitrite: NEGATIVE
Protein, ur: NEGATIVE mg/dL
Specific Gravity, Urine: 1.01 (ref 1.005–1.030)
pH: 7 (ref 5.0–8.0)

## 2023-10-05 LAB — CBC WITH DIFFERENTIAL/PLATELET
Abs Immature Granulocytes: 0.03 10*3/uL (ref 0.00–0.07)
Basophils Absolute: 0.1 10*3/uL (ref 0.0–0.1)
Basophils Relative: 1 %
Eosinophils Absolute: 0.1 10*3/uL (ref 0.0–0.5)
Eosinophils Relative: 1 %
HCT: 41.6 % (ref 36.0–46.0)
Hemoglobin: 14.1 g/dL (ref 12.0–15.0)
Immature Granulocytes: 0 %
Lymphocytes Relative: 21 %
Lymphs Abs: 1.8 10*3/uL (ref 0.7–4.0)
MCH: 27 pg (ref 26.0–34.0)
MCHC: 33.9 g/dL (ref 30.0–36.0)
MCV: 79.7 fL — ABNORMAL LOW (ref 80.0–100.0)
Monocytes Absolute: 0.8 10*3/uL (ref 0.1–1.0)
Monocytes Relative: 9 %
Neutro Abs: 6.1 10*3/uL (ref 1.7–7.7)
Neutrophils Relative %: 68 %
Platelets: 195 10*3/uL (ref 150–400)
RBC: 5.22 MIL/uL — ABNORMAL HIGH (ref 3.87–5.11)
RDW: 13.7 % (ref 11.5–15.5)
WBC: 8.9 10*3/uL (ref 4.0–10.5)
nRBC: 0 % (ref 0.0–0.2)

## 2023-10-05 LAB — COMPREHENSIVE METABOLIC PANEL WITH GFR
ALT: 17 U/L (ref 0–44)
AST: 20 U/L (ref 15–41)
Albumin: 4.1 g/dL (ref 3.5–5.0)
Alkaline Phosphatase: 106 U/L (ref 38–126)
Anion gap: 14 (ref 5–15)
BUN: 7 mg/dL — ABNORMAL LOW (ref 8–23)
CO2: 22 mmol/L (ref 22–32)
Calcium: 10.3 mg/dL (ref 8.9–10.3)
Chloride: 102 mmol/L (ref 98–111)
Creatinine, Ser: 0.45 mg/dL (ref 0.44–1.00)
GFR, Estimated: >60 mL/min (ref >60)
Glucose, Bld: 138 mg/dL — ABNORMAL HIGH (ref 70–99)
Potassium: 3.9 mmol/L (ref 3.5–5.1)
Sodium: 138 mmol/L (ref 135–145)
Total Bilirubin: 0.5 mg/dL (ref 0.0–1.2)
Total Protein: 7.5 g/dL (ref 6.5–8.1)

## 2023-10-05 LAB — URINALYSIS, MICROSCOPIC (REFLEX)

## 2023-10-05 LAB — LIPASE, BLOOD: Lipase: 17 U/L (ref 11–51)

## 2023-10-05 MED ORDER — CEPHALEXIN 500 MG PO CAPS
500.0000 mg | ORAL_CAPSULE | Freq: Three times a day (TID) | ORAL | 0 refills | Status: AC
Start: 2023-10-05 — End: 2023-10-10
  Filled 2023-10-05: qty 15, 5d supply, fill #0

## 2023-10-05 MED ORDER — IOHEXOL 300 MG/ML  SOLN
100.0000 mL | Freq: Once | INTRAMUSCULAR | Status: AC | PRN
  Administered 2023-10-05: 100 mL via INTRAVENOUS

## 2023-10-05 MED ORDER — SODIUM CHLORIDE 0.9 % IV BOLUS
500.0000 mL | Freq: Once | INTRAVENOUS | Status: AC
  Administered 2023-10-05: 500 mL via INTRAVENOUS

## 2023-10-05 MED ORDER — SODIUM CHLORIDE 0.9 % IV SOLN
2.0000 g | Freq: Once | INTRAVENOUS | Status: AC
  Administered 2023-10-05: 2 g via INTRAVENOUS
  Filled 2023-10-05: qty 20

## 2023-10-05 NOTE — Discharge Instructions (Addendum)
 Return if you develop fever uncontrollable nausea vomiting.  Take next dose of antibiotic tomorrow morning.  Follow-up with your primary care doctor.

## 2023-10-05 NOTE — ED Triage Notes (Addendum)
 Lower abdominal pain since last night.  Pt has a indwelling catheter and colostomy.  Pt states her catheter has been leaking since March.  Pt seen several times by providers.  Pt states she is having more sediment with her catheter drainage, pt states its a white slimy drainage.   No known fever.  Occasional nausea, no vomiting or diarrhea.  Pt also states her A1C is elevated.

## 2023-10-05 NOTE — ED Notes (Signed)
 Leg bag replaced for a clean catch to be obtain.

## 2023-10-05 NOTE — ED Provider Notes (Signed)
 [name] Provider Note   CSN: [id number] Arrival date & time: 10/05/23  4098     History  Chief Complaint  Patient presents with   Abdominal Pain    [name] is a 73 y.o. female. The patient, with a history of incomplete quadriplegia C5-6,  proteus urinary tract infections, colostomy, HTN, hyperglycemia, neurogenic bladder, OSA presents with abdominal pain and an unusual biofilm in her foley catheter. The abdominal pain, described as 'really bad gas pains,' started yesterday. The biofilm, described as 'white, thin lines,' has been present since the week before March 26th. The patient has been changing her catheter every two days instead of the usual two weeks due to the biofilm. She also reports a new issue with her left hip or knee, describing it as 'going toward me' when she turns over. The patient denies fever or chills.  See Biofilm photo:      Home Medications Prior to Admission medications   Medication Sig Start Date End Date Taking? Authorizing Provider  baclofen  (LIORESAL ) 20 MG tablet Take 1 tablet (20 mg total) by mouth 2 (two) times daily. 09/25/23   Neda Balk, MD  clobetasol (TEMOVATE) 0.05 % external solution Apply 1 application topically every Monday, Wednesday, and Friday. Use on scalp 3 times per week    [provider]  docusate sodium  (COLACE) 100 MG capsule Take 1 capsule (100 mg total) by mouth daily. 09/28/21   Saguier, Gaylin Ke, PA-C  ELDERBERRY PO Take 2 each by mouth daily.    [provider]  FIBER PO Take 1 capsule by mouth daily.    [provider]  hydrochlorothiazide  (MICROZIDE ) 12.5 MG capsule Take 1 capsule (12.5 mg total) by mouth daily. 09/25/23   Neda Balk, MD  influenza vaccine adjuvanted (FLUAD) 0.5 ML injection Inject into the muscle. 03/23/21   Liane Redman, MD  lisinopril  (ZESTRIL ) 5 MG tablet Take 1 tablet (5 mg total) by mouth daily. 09/25/23   Neda Balk, MD  Multiple Vitamins-Calcium  (ONE-A-DAY WOMENS FORMULA PO) Take 1 tablet by mouth daily.    [provider]  OXYBUTYNIN  CHLORIDE ER PO Take 10 mg by mouth at bedtime.    [provider]  Probiotic Product (DIGESTIVE ADVANTAGE) CAPS Take 1 tablet by mouth daily. 06/13/14   [provider]  rosuvastatin  (CRESTOR ) 5 MG tablet Take 1 tablet (5 mg total) by mouth daily. 09/25/23   Neda Balk, MD  Semaglutide ,0.25 or 0.5MG /DOS, (OZEMPIC , 0.25 OR 0.5 MG/DOSE,) 2 MG/3ML SOPN Inject 0.25 mg into the skin once a week. 09/27/23   Neda Balk, MD      Allergies    Adhesive [tape]    Review of Systems   Review of Systems  Constitutional:  Negative for activity change, appetite change and fever.  HENT:  Negative for congestion.   Respiratory:  Negative for shortness of breath.   Cardiovascular:  Negative for chest pain and leg swelling.  Gastrointestinal:  Positive for abdominal pain. Negative for abdominal distention, constipation, nausea and vomiting.  Genitourinary:  Negative for decreased urine volume and difficulty urinating.   Physical Exam Updated Vital Signs BP (!) 152/94 (BP Location: Right Arm)   Pulse 62   Temp 98.2 F (36.8 C)   Resp 20   Ht 5\' 11"  (1.803 m)   Wt 100.2 kg   SpO2 99%   BMI 30.82 kg/m  Physical Exam Constitutional:      General:  She is not in acute distress.    Appearance: She is not ill-appearing or toxic-appearing.  HENT:     Head: Normocephalic and atraumatic.  Cardiovascular:     Rate and Rhythm: Normal rate and regular rhythm.  Pulmonary:     Effort: Pulmonary effort is normal.     Breath sounds: Normal breath sounds.  Abdominal:     General: Bowel sounds are normal. There is no distension.     Palpations: Abdomen is soft.     Tenderness: There is no abdominal tenderness.     Comments: Colostomy clean and dry, bag in place  Skin:    General: Skin is warm.     Capillary Refill: Capillary refill takes less than 2  seconds.  Neurological:     Mental Status: She is alert.     Comments: Quadreplegia   Psychiatric:        Mood and Affect: Mood normal.   Foley in place, bag with urine no sediment  ED Results / Procedures / Treatments   Labs (all labs ordered are listed, but only abnormal results are displayed) Labs Reviewed  CBC WITH DIFFERENTIAL/PLATELET - Abnormal; Notable for the following components:      Result Value   RBC 5.22 (*)    MCV 79.7 (*)    All other components within normal limits  COMPREHENSIVE METABOLIC PANEL WITH GFR  LIPASE, BLOOD  URINALYSIS, ROUTINE W REFLEX MICROSCOPIC    EKG None  Radiology No results found.  Procedures Procedures    Medications Ordered in ED Medications - No data to display  ED Course/ Medical Decision Making/ A&P                                 Medical Decision Making   Medical Decision Making:   [name] is a 73 y.o. female who presented to the ED today with concern of abdominal pain and sediment in the urine detailed above.    Additional history discussed with patient's family/caregivers.  Patient's presentation is complicated by their history of quadreplegia.  Complete initial physical exam performed, notably the patient was nontoxic and well appearing.    Reviewed and confirmed nursing documentation for past medical history, family history, social history.    Initial Assessment:   With the patient's presentation of transient abdominal pain, considered gastroenteritis, SBO, diverticulitis. However, reassured as the patient has had normal output from her colostomy without hematochezia and has been experiencing no emesis or hematemesis.  Patient does report of concern about a possible blockage and would like to be scan for further assessment.  Nonacute abdomen, soft, nonperitoneal.  Identified urine sediment on picture but not present on examination today.  Will obtain a UA although likely to grow bacteria given that she has a  chronic indwelling Foley catheter.  She follows closely with urology and recently had her catheter changed 2 days ago.  She saw urology quite recently and received a cystogram that was normal per the patient.  She has been experiencing this sediment for a month now, no infectious symptoms currently.  Additionally, the patient reports of a change in location of her left knee and hip.  However, there is no abnormality on physical examination and there has been no trauma to the left knee or hip.  Unsure of the utility of x-ray of the left knee and hip given history and presentation.  Initial Plan:   Screening labs  including CBC and Metabolic panel to evaluate for infectious or metabolic etiology of disease.  Urinalysis with reflex culture ordered to evaluate for UTI or relevant urologic/nephrologic pathology.  CT Abd and pelvis  EKG to evaluate for cardiac pathology Objective evaluation as below reviewed   Initial Study Results:   Laboratory  All laboratory results reviewed without evidence of clinically relevant pathology to this [name] Pending UA and CMP   Radiology:  Pending CT  No results found.    Final Assessment and Plan:    Transient Abdominal Pain: Unknown etiology of the transient abdominal pain, CT abdomen pelvis pending.  To this [name], laboratory results are overall benign.  Reassured by abdominal exam as she is nonperitoneal.  Anticipate possible discharge after CT performed  Abnormal Urine Sediment: Reported by the patient ongoing for approximately 1 month.  UA pending.  Anticipate bacteria in the urine given chronic indwelling Foley catheter.  She needs to follow-up with urology for further assessment.  MSK: Complaint in change of positioning of the left knee and left hip with certain movements.  She does not feel any pain in this area but is a quadriplegic.  Has had no trauma to the region.  Is concerned about the possibility of structural changes in the hip and knee causing  these changes.  Unsure of the utility of an x-ray of the hip and knee.  Physical examination is unremarkable without any evidence of erythema, no trauma, no abrasions.    Workup pending, will sign out to night team for continued assessment.  Anticipate disposition if all workup is unremarkable.  Clinical Impression:  1. Right lower quadrant abdominal pain      Data Unavailable          Final Clinical Impression(s) / ED Diagnoses Final diagnoses:  Right lower quadrant abdominal pain    Rx / DC Orders ED Discharge Orders     None         Ernestina Headland, MD 10/05/23 8295    Scarlette Currier, MD 10/05/23 2225

## 2023-10-05 NOTE — ED Notes (Signed)
 Coworkers helped get the pt undressed, she had to be lifted from her powered wheelchair, and then disrobed in bed. Once the pt is in a gown I will return.

## 2023-10-05 NOTE — Telephone Encounter (Signed)
 Pt will be going to ER today.  She will hold off on this for now.

## 2023-10-05 NOTE — ED Provider Notes (Signed)
 CT scan abdomen pelvis unremarkable.  Urinalysis possibly with infection.  Given a dose of Rocephin  here.  Will treat with Keflex .  Will have her follow-up with primary care.  Discharged in good condition.  This chart was dictated using voice recognition software.  Despite best efforts to proofread,  errors can occur which can change the documentation meaning.    Lowery Rue, DO 10/05/23 [phone]

## 2023-10-05 NOTE — Telephone Encounter (Signed)
 Pt would like for you to look at message from 4 days ago.

## 2023-10-05 NOTE — ED Notes (Signed)
 Discharge paperwork reviewed entirely with patient, including follow up care. Pain was under control. The patient received instruction and coaching on their prescriptions, and all follow-up questions were answered.  Pt verbalized understanding as well as all parties involved. No questions or concerns voiced at the time of discharge. No acute distress noted. Pt was encouraged to stay adequately hydrated and eat a healthy diet.   Pt was wheeled out to the PVA in a wheelchair without incident.  Pt advised they will notify their PCP immediately.  The pt was instructed to set up and/or review MyChart for their results; and was informed their Providers all have access to the information as well.

## 2023-10-06 LAB — URINE CULTURE

## 2023-10-13 NOTE — Progress Notes (Unsigned)
      Established patient visit   Patient: [name]   DOB: 1951/04/27   72 y.o. Female  MRN: [id number] Visit Date: 10/16/2023  Today's healthcare provider: Trenton Frock, PA-C   No chief complaint on file.  Subjective     [name] presents for an ED f/u she was seen 4/24 for RLQ abdominal pain. CT scan was negative, UA with bacteruria, given IM rocephin  and discharged w. Keflex .  Culture returned --MULTIPLE SPECIES PRESENT, SUGGEST RECOLLECTION   Medications: Outpatient Medications Prior to Visit  Medication Sig   baclofen  (LIORESAL ) 20 MG tablet Take 1 tablet (20 mg total) by mouth 2 (two) times daily.   clobetasol (TEMOVATE) 0.05 % external solution Apply 1 application topically every Monday, Wednesday, and Friday. Use on scalp 3 times per week   docusate sodium  (COLACE) 100 MG capsule Take 1 capsule (100 mg total) by mouth daily.   ELDERBERRY PO Take 2 each by mouth daily.   FIBER PO Take 1 capsule by mouth daily.   hydrochlorothiazide  (MICROZIDE ) 12.5 MG capsule Take 1 capsule (12.5 mg total) by mouth daily.   influenza vaccine adjuvanted (FLUAD) 0.5 ML injection Inject into the muscle.   lisinopril  (ZESTRIL ) 5 MG tablet Take 1 tablet (5 mg total) by mouth daily.   Multiple Vitamins-Calcium  (ONE-A-DAY WOMENS FORMULA PO) Take 1 tablet by mouth daily.   OXYBUTYNIN  CHLORIDE ER PO Take 10 mg by mouth at bedtime.   Probiotic Product (DIGESTIVE ADVANTAGE) CAPS Take 1 tablet by mouth daily.   rosuvastatin  (CRESTOR ) 5 MG tablet Take 1 tablet (5 mg total) by mouth daily.   Semaglutide ,0.25 or 0.5MG /DOS, (OZEMPIC , 0.25 OR 0.5 MG/DOSE,) 2 MG/3ML SOPN Inject 0.25 mg into the skin once a week.   No facility-administered medications prior to visit.    Review of Systems {Insert previous labs (optional):23779} {See past labs  Heme  Chem  Endocrine  Serology  Results Review (optional):1}   Objective    There were no vitals taken for this visit. {Insert last BP/Wt  (optional):23777}{See vitals history (optional):1}  Physical Exam  ***  No results found for any visits on 10/16/23.  Assessment & Plan    There are no diagnoses linked to this encounter.  ***  No follow-ups on file.       Trenton Frock, PA-C  [facility] [phone] (phone) [phone] (fax)  [facility]

## 2023-10-16 ENCOUNTER — Ambulatory Visit (INDEPENDENT_AMBULATORY_CARE_PROVIDER_SITE_OTHER): Admitting: Physician Assistant

## 2023-10-16 ENCOUNTER — Encounter: Payer: Self-pay | Admitting: Physician Assistant

## 2023-10-16 VITALS — BP 131/81 | HR 60

## 2023-10-16 DIAGNOSIS — Z09 Encounter for follow-up examination after completed treatment for conditions other than malignant neoplasm: Secondary | ICD-10-CM | POA: Diagnosis not present

## 2023-10-16 DIAGNOSIS — Z87440 Personal history of urinary (tract) infections: Secondary | ICD-10-CM | POA: Diagnosis not present

## 2023-10-16 DIAGNOSIS — Z978 Presence of other specified devices: Secondary | ICD-10-CM

## 2023-10-16 NOTE — Addendum Note (Signed)
 Addended byTrenton Frock on: 10/16/2023 02:46 PM   Modules accepted: Level of Service

## 2023-10-24 DIAGNOSIS — R32 Unspecified urinary incontinence: Secondary | ICD-10-CM | POA: Diagnosis not present

## 2023-10-24 DIAGNOSIS — G8250 Quadriplegia, unspecified: Secondary | ICD-10-CM | POA: Diagnosis not present

## 2023-10-24 DIAGNOSIS — Z933 Colostomy status: Secondary | ICD-10-CM | POA: Diagnosis not present

## 2023-10-24 DIAGNOSIS — N390 Urinary tract infection, site not specified: Secondary | ICD-10-CM | POA: Diagnosis not present

## 2023-11-09 DIAGNOSIS — R32 Unspecified urinary incontinence: Secondary | ICD-10-CM | POA: Diagnosis not present

## 2023-11-09 DIAGNOSIS — N390 Urinary tract infection, site not specified: Secondary | ICD-10-CM | POA: Diagnosis not present

## 2023-11-09 DIAGNOSIS — G8250 Quadriplegia, unspecified: Secondary | ICD-10-CM | POA: Diagnosis not present

## 2023-11-09 DIAGNOSIS — Z933 Colostomy status: Secondary | ICD-10-CM | POA: Diagnosis not present

## 2023-11-16 ENCOUNTER — Encounter: Payer: Self-pay | Admitting: Family Medicine

## 2023-11-24 ENCOUNTER — Telehealth: Payer: Self-pay

## 2023-11-24 NOTE — Telephone Encounter (Signed)
 Copied from CRM [id number]. Topic: Clinical - Medication Question >> Nov 24, 2023  1:30 PM Martinique E wrote: Reason for CRM: Patient called in regarding her Semaglutide ,0.25 or 0.5MG /DOS, (OZEMPIC , 0.25 OR 0.5 MG/DOSE,) 2 MG/3ML SOPN medication. Patient was supposed to receive the 0.5 mg dose, but the 0.25 mg got sent in. Patient questioning if she should be on the 0.25 mg for another 8 weeks or get the 0.5 mg called in.

## 2023-11-27 ENCOUNTER — Other Ambulatory Visit: Payer: Self-pay | Admitting: Family

## 2023-11-27 MED ORDER — SEMAGLUTIDE(0.25 OR 0.5MG/DOS) 2 MG/1.5ML ~~LOC~~ SOPN: 0.5000 mg | PEN_INJECTOR | SUBCUTANEOUS | 1 refills | Status: DC

## 2023-11-27 NOTE — Telephone Encounter (Signed)
 Patient returned call and would like to increase to the 0.5 mg and stated that it could be send to [facility] on [address].

## 2023-11-27 NOTE — Telephone Encounter (Signed)
 Called patient and no answer left vm to return call

## 2023-11-28 DIAGNOSIS — G8250 Quadriplegia, unspecified: Secondary | ICD-10-CM | POA: Diagnosis not present

## 2023-11-28 DIAGNOSIS — Z933 Colostomy status: Secondary | ICD-10-CM | POA: Diagnosis not present

## 2023-11-28 DIAGNOSIS — R32 Unspecified urinary incontinence: Secondary | ICD-10-CM | POA: Diagnosis not present

## 2023-11-28 DIAGNOSIS — N390 Urinary tract infection, site not specified: Secondary | ICD-10-CM | POA: Diagnosis not present

## 2023-12-25 ENCOUNTER — Other Ambulatory Visit (INDEPENDENT_AMBULATORY_CARE_PROVIDER_SITE_OTHER)

## 2023-12-25 DIAGNOSIS — R739 Hyperglycemia, unspecified: Secondary | ICD-10-CM | POA: Diagnosis not present

## 2023-12-25 DIAGNOSIS — I10 Essential (primary) hypertension: Secondary | ICD-10-CM | POA: Diagnosis not present

## 2023-12-25 DIAGNOSIS — E782 Mixed hyperlipidemia: Secondary | ICD-10-CM

## 2023-12-25 LAB — LIPID PANEL
Cholesterol: 134 mg/dL (ref 0–200)
HDL: 44.3 mg/dL (ref >39.00)
LDL Cholesterol: 64 mg/dL (ref 0–99)
NonHDL: 89.46
Total CHOL/HDL Ratio: 3
Triglycerides: 125 mg/dL (ref 0.0–149.0)
VLDL: 25 mg/dL (ref 0.0–40.0)

## 2023-12-25 LAB — CBC WITH DIFFERENTIAL/PLATELET
Basophils Absolute: 0.1 K/uL (ref 0.0–0.1)
Basophils Relative: 1.1 % (ref 0.0–3.0)
Eosinophils Absolute: 0.2 K/uL (ref 0.0–0.7)
Eosinophils Relative: 2.2 % (ref 0.0–5.0)
HCT: 39.5 % (ref 36.0–46.0)
Hemoglobin: 13.2 g/dL (ref 12.0–15.0)
Lymphocytes Relative: 26.7 % (ref 12.0–46.0)
Lymphs Abs: 1.9 K/uL (ref 0.7–4.0)
MCHC: 33.3 g/dL (ref 30.0–36.0)
MCV: 79.8 fl (ref 78.0–100.0)
Monocytes Absolute: 0.5 K/uL (ref 0.1–1.0)
Monocytes Relative: 7.1 % (ref 3.0–12.0)
Neutro Abs: 4.4 K/uL (ref 1.4–7.7)
Neutrophils Relative %: 62.9 % (ref 43.0–77.0)
Platelets: 241 K/uL (ref 150.0–400.0)
RBC: 4.94 Mil/uL (ref 3.87–5.11)
RDW: 14.8 % (ref 11.5–15.5)
WBC: 7.1 K/uL (ref 4.0–10.5)

## 2023-12-25 LAB — COMPREHENSIVE METABOLIC PANEL WITH GFR
ALT: 11 U/L (ref 0–35)
AST: 12 U/L (ref 0–37)
Albumin: 4.2 g/dL (ref 3.5–5.2)
Alkaline Phosphatase: 91 U/L (ref 39–117)
BUN: 6 mg/dL (ref 6–23)
CO2: 30 meq/L (ref 19–32)
Calcium: 9.5 mg/dL (ref 8.4–10.5)
Chloride: 97 meq/L (ref 96–112)
Creatinine, Ser: 0.42 mg/dL (ref 0.40–1.20)
GFR: 98.6 mL/min (ref >60.00)
Glucose, Bld: 89 mg/dL (ref 70–99)
Potassium: 4.4 meq/L (ref 3.5–5.1)
Sodium: 134 meq/L — ABNORMAL LOW (ref 135–145)
Total Bilirubin: 0.5 mg/dL (ref 0.2–1.2)
Total Protein: 7 g/dL (ref 6.0–8.3)

## 2023-12-25 LAB — HEMOGLOBIN A1C: Hgb A1c MFr Bld: 6.1 % (ref 4.6–6.5)

## 2023-12-26 ENCOUNTER — Ambulatory Visit: Payer: Self-pay | Admitting: Family Medicine

## 2023-12-27 ENCOUNTER — Other Ambulatory Visit: Payer: Self-pay | Admitting: Family Medicine

## 2023-12-27 NOTE — Progress Notes (Signed)
Patient reviewed via MyChart.

## 2023-12-27 NOTE — Telephone Encounter (Signed)
 Copied from CRM [id number]. Topic: Clinical - Medication Refill >> Dec 27, 2023  8:20 AM Aleatha C wrote: Medication:  influenza vaccine adjuvanted (FLUAD) 0.5 ML injection    Has the patient contacted their pharmacy? Yes (Agent: If no, request that the patient contact the pharmacy for the refill. If patient does not wish to contact the pharmacy document the reason why and proceed with request.) (Agent: If yes, when and what did the pharmacy advise?)  This is the patient's preferred pharmacy:    [id number] [place], [place] - [address] [place] [address] Phone: [phone] Fax: [phone]  Is this the correct pharmacy for this prescription? Yes If no, delete pharmacy and type the correct one.   Has the prescription been filled recently? No  Is the patient out of the medication? Yes  Has the patient been seen for an appointment in the last year OR does the patient have an upcoming appointment? Yes  Can we respond through MyChart? No  Agent: Please be advised that Rx refills may take up to 3 business days. We ask that you follow-up with your pharmacy.

## 2023-12-28 DIAGNOSIS — G4733 Obstructive sleep apnea (adult) (pediatric): Secondary | ICD-10-CM | POA: Diagnosis not present

## 2024-01-01 ENCOUNTER — Ambulatory Visit
Admission: EM | Admit: 2024-01-01 | Discharge: 2024-01-01 | Disposition: A | Discharge status: Home / Self Care | Attending: Physician Assistant | Admitting: Physician Assistant

## 2024-01-01 ENCOUNTER — Encounter: Payer: Self-pay | Admitting: Emergency Medicine

## 2024-01-01 DIAGNOSIS — M7989 Other specified soft tissue disorders: Secondary | ICD-10-CM

## 2024-01-01 DIAGNOSIS — G8254 Quadriplegia, C5-C7 incomplete: Secondary | ICD-10-CM | POA: Diagnosis not present

## 2024-01-01 DIAGNOSIS — L539 Erythematous condition, unspecified: Secondary | ICD-10-CM

## 2024-01-01 MED ORDER — DOXYCYCLINE HYCLATE 100 MG PO CAPS: 100.0000 mg | ORAL_CAPSULE | Freq: Two times a day (BID) | ORAL | 0 refills | Status: DC

## 2024-01-01 NOTE — ED Notes (Signed)
 Pt scheduled for DVT US  tomorrow at 3pm at [facility]. Spoke with Rosina to schedule.

## 2024-01-01 NOTE — ED Triage Notes (Signed)
 Pt presents c/o left foot swelling on inside of left heel. Pt says she doesn't have any feeling in the left foot and has not noticed an injury, just redness in the area.

## 2024-01-01 NOTE — Discharge Instructions (Signed)
 Please check in at 2:45PM tomorrow 01/02/2024 at the radiology department at [facility] for your Ultrasound and x-rays.   We are going to start an antibiotic to cover for infection.  Start doxycycline  100 mg twice daily for 7 days.  Stay out of the sun while on this medication.  Continue using your compression stocking keep your leg elevated is much as possible.  Please go get the x-rays and ultrasound tomorrow as we discussed.  If anything worsens overnight you have increasing pain, swelling, chest pain, shortness of breath, lightheadedness, weakness, fever you need to be seen immediately.  Follow-up with your primary care next week.

## 2024-01-01 NOTE — ED Provider Notes (Signed)
 [facility]    CSN: [id number] Arrival date & time: 01/01/24  1111      History   Chief Complaint Chief Complaint  Patient presents with   Foot Swelling    HPI [name] is a 73 y.o. female.   Patient presents today with a several day history of erythema of her medial left foot.  She has a history of incomplete quadriplegia and has been in a wheelchair for 44 years.  She denies any known injury but reports that she has very limited sensation in her foot/leg and so is unsure if something could have been injured during transfer.  She has not had any fever, nausea, vomiting.  She does report chronic lower extremity edema but this has been worse on the left despite consistent use of compression stockings.  She does not have any significant pain as she has decree sensation on this side but does report that she has had an increase in spasms/involuntary movements of the left leg which is unusual for her and is only recently started in the past few months.  She denies history of recurrent skin infections.  She was treated with cephalexin  for UTI in April 2025 but denies any additional antibiotics in the past 90 days.  She denies any chest pain, shortness of breath, lightheadedness, palpitations.    Past Medical History:  Diagnosis Date   Acute bronchitis 05/16/2015   Anemia    after shoulder replacement   Autonomic dysreflexia    AVM (arteriovenous malformation) spine 1981   Bowel dysfunction    constipation and fecal retention   Cerumen impaction 10/11/2015   Complication of anesthesia    low temperature after general anesthesia for colostomy   Constipation 01/25/2016   Contusion 05/18/2014   Of foot   Depression 02/13/2017   History of    Headache 04/09/2017   History of kidney stones    History of neurogenic bowel    History of rotator cuff tear    Right   History of ventral hernia    Hyperglycemia 05/18/2015   Hyperlipidemia    Hypertension    No medications at  this time   Incomplete quadriplegia at C5-6 level (HCC) 07/24/2008   Qualifier: Diagnosis of  By: Georgian ROSALEA CHARM Lamar Requires bowel regimen including enemas and indwelling foley cath Has had tendency towards pressure sores in past. Had a tear with a transfer in Hospital in past for surgery and developed a sore at that time.    Low blood pressure reading    Occassionally   Medicare annual wellness visit, subsequent 11/02/2014   Follows with [facility] Declines colonoscopy  No h/o abnormal pap, declines further paps   MVP (mitral valve prolapse) 12/01/2012   Mild   Neurogenic bladder    OA (osteoarthritis)    Hip and shoulders, neck   Obesity 11/01/2016   OSA on CPAP 01/31/2016   Pain in joint, shoulder region 12/01/2012   Long history B/l Follows with Dr Gladis [facility]   Postmenopausal bleeding 12/17/2012   Preventative health care 06/26/2013   Quadriplegia (HCC) 1981   Secondary to ruptured AVM C5-C6   Sacral pressure ulcer 12/01/2012   H/o Follows with Dr Todd Molt of Dermatology   Skin cancer 11/01/2016   2 BCC RLE excised by Dr Rolan Molt 2018 1SCC LLE excised by Dr Rolan Molt 2018   Vaginitis and vulvovaginitis 12/17/2012   Wears glasses     Patient Active Problem  List   Diagnosis Date Noted   Atopic dermatitis 11/18/2019   Kidney stone 06/25/2018   Colostomy status (HCC) 02/19/2018   Headache 04/09/2017   Obesity 11/01/2016   OSA (obstructive sleep apnea) 01/31/2016   Cerumen impaction 10/11/2015   Hyperglycemia 05/18/2015   Medicare annual wellness visit, subsequent 11/02/2014   Preventative health care 06/26/2013   Neurogenic bladder 12/01/2012   Right shoulder pain 12/01/2012   Mitral valve disorder 12/01/2012   Hyperlipidemia 07/27/2008   Essential hypertension 07/27/2008   Incomplete quadriplegia at C5-6 level (HCC) 07/24/2008    Past Surgical History:  Procedure Laterality Date   ANAL TAG REMOVAL  2014   BASAL CELL CARCINOMA EXCISION  2016   Lip    BASAL CELL CARCINOMA EXCISION  2018   right leg x2   BASAL CELL CARCINOMA EXCISION Left 2018   leg, chest   BASAL CELL CARCINOMA EXCISION Right 2019   leg   BLADDER STONE REMOVAL  1986/87   COLONOSCOPY  02/22/2016   COLOSTOMY  02/22/2016   CYSTOSCOPY WITH BIOPSY N/A 06/21/2018   Procedure: CYSTOSCOPY WITH BIOPSY FULGURATION;  Surgeon: Watt Rush, MD;  Location: [facility];  Service: Urology;  Laterality: N/A;   CYSTOSCOPY WITH LITHOLAPAXY N/A 06/21/2018   Procedure: CYSTOSCOPY WITH LITHOLAPAXY;  Surgeon: Watt Rush, MD;  Location: [facility];  Service: Urology;  Laterality: N/A;   DORSAL CHEILECTOMY-HALLUX RIGIDUS Bilateral 2011   HERNIA REPAIR  2012   VENTRAL umbilical hernia repair   LAMINECTOMY  10/23/1979   AVM   LAPAROSCOPIC SIGMOID COLECTOMY  02/22/2016   ROTATOR CUFF REPAIR  1996, 1997   left shoulder   ROTATOR CUFF REPAIR Right 2001   SHOULDER ARTHROSCOPY  2007   right   SQUAMOUS CELL CARCINOMA EXCISION Left 2018   leg   Toe Nails Removal Bilateral 1981   both great toes   TOTAL SHOULDER REPLACEMENT  2001   left   TRANSFER TENDON HAND  1982, 1983   to right hand X3   VENTRAL HERNIA REPAIR  08/17/2010   Postop ileus (Dr. Tanda)    OB History     Gravida  0   Para  0   Term  0   Preterm  0   AB  0   Living  0      SAB  0   IAB  0   Ectopic  0   Multiple  0   Live Births               Home Medications    Prior to Admission medications   Medication Sig Start Date End Date Taking? Authorizing Provider  doxycycline  (VIBRAMYCIN ) 100 MG capsule Take 1 capsule (100 mg total) by mouth 2 (two) times daily. 01/01/24  Yes Laconda Basich K, PA-C  baclofen  (LIORESAL ) 20 MG tablet Take 1 tablet (20 mg total) by mouth 2 (two) times daily. 09/25/23   Domenica Harlene LABOR, MD  clobetasol (TEMOVATE) 0.05 % external solution Apply 1 application topically every Monday, Wednesday, and Friday. Use on scalp 3 times per week     [provider]  docusate sodium  (COLACE) 100 MG capsule Take 1 capsule (100 mg total) by mouth daily. 09/28/21   Saguier, [place], PA-C  ELDERBERRY PO Take 2 each by mouth daily.    [provider]  FIBER PO Take 2 capsules by mouth daily.    [provider]  hydrochlorothiazide  (MICROZIDE ) 12.5 MG capsule Take 1 capsule (12.5  mg total) by mouth daily. 09/25/23   Domenica Harlene LABOR, MD  lisinopril  (ZESTRIL ) 5 MG tablet Take 1 tablet (5 mg total) by mouth daily. 09/25/23   Domenica Harlene LABOR, MD  Multiple Vitamins-Calcium  (ONE-A-DAY WOMENS FORMULA PO) Take 1 tablet by mouth daily.    [provider]  OXYBUTYNIN  CHLORIDE ER PO Take 20 mg by mouth at bedtime.    [provider]  Probiotic Product (DIGESTIVE ADVANTAGE) CAPS Take 1 tablet by mouth daily. 06/13/14   [provider]  rosuvastatin  (CRESTOR ) 5 MG tablet Take 1 tablet (5 mg total) by mouth daily. 09/25/23   Domenica Harlene LABOR, MD  Semaglutide ,0.25 or 0.5MG /DOS, (OZEMPIC , 0.25 OR 0.5 MG/DOSE,) 2 MG/3ML SOPN Inject 0.5 mg into the skin once a week. 12/27/23   Domenica Harlene LABOR, MD    Family History Family History  Problem Relation Age of Onset   Cancer Mother        breast & melanoma   Hypertension Mother    Heart disease Father        CAD, MI, AFib   Cancer Sister        melanoma   Dementia Sister        Alzheimer's   Cancer Maternal Aunt        breast   Cancer Paternal Aunt        breast   Cancer Paternal Aunt        breast   Cancer Paternal Uncle        breast   Cancer Maternal Grandfather        melanoma   Stroke Paternal Grandmother    Heart disease Paternal Grandfather     Social History Social History   Tobacco Use   Smoking status: Never    Passive exposure: Never   Smokeless tobacco: Never  Vaping Use   Vaping status: Never Used  Substance Use Topics   Alcohol use: No   Drug use: No     Allergies   Adhesive [tape]   Review of Systems Review of Systems   Constitutional:  Negative for activity change, appetite change, fatigue and fever.  Respiratory:  Negative for shortness of breath.   Cardiovascular:  Positive for leg swelling. Negative for chest pain and palpitations.  Gastrointestinal:  Negative for abdominal pain, diarrhea, nausea and vomiting.  Musculoskeletal:  Negative for arthralgias and myalgias.  Skin:  Positive for color change. Negative for wound.  Neurological:  Positive for numbness (Chronic).     Physical Exam Triage Vital Signs ED Triage Vitals  Encounter Vitals Group     BP 01/01/24 1131 134/80     Girls Systolic BP Percentile --      Girls Diastolic BP Percentile --      Boys Systolic BP Percentile --      Boys Diastolic BP Percentile --      Pulse Rate 01/01/24 1131 71     Resp 01/01/24 1131 18     Temp 01/01/24 1131 97.9 F (36.6 C)     Temp Source 01/01/24 1131 Oral     SpO2 01/01/24 1131 98 %     Weight 01/01/24 1131 220 lb 14.4 oz (100.2 kg)     Height --      Head Circumference --      Peak Flow --      Pain Score 01/01/24 1129 0     Pain Loc --      Pain Education --  Exclude from Growth Chart --    No data found.  Updated Vital Signs BP 134/80 (BP Location: Right Arm)   Pulse 71   Temp 97.9 F (36.6 C) (Oral)   Resp 18   Wt 220 lb 14.4 oz (100.2 kg)   SpO2 98%   BMI 30.81 kg/m   Visual Acuity Right Eye Distance:   Left Eye Distance:   Bilateral Distance:    Right Eye Near:   Left Eye Near:    Bilateral Near:     Physical Exam Vitals reviewed.  Constitutional:      General: She is awake. She is not in acute distress.    Appearance: Normal appearance. She is well-developed. She is not ill-appearing.     Comments: Very pleasant female appears stated age sitting in motorized wheelchair in no acute distress  HENT:     Head: Normocephalic and atraumatic.  Cardiovascular:     Rate and Rhythm: Normal rate and regular rhythm.     Heart sounds: Normal heart sounds, S1 normal and  S2 normal. No murmur heard.    Comments: Bilateral pitting edema worse on the left. Pulmonary:     Effort: Pulmonary effort is normal.     Breath sounds: Normal breath sounds. No wheezing, rhonchi or rales.     Comments: Clear to auscultation bilaterally Musculoskeletal:     Right lower leg: 2+ Edema present.     Left lower leg: 3+ Edema present.       Feet:  Feet:     Left foot:     Skin integrity: Erythema present. No ulcer, blister or skin breakdown.     Toenail Condition: Left toenails are normal.     Comments: Left foot: Erythema noted medial left heel without associated wound.  No bleeding or drainage.  No streaking noted. Psychiatric:        Behavior: Behavior is cooperative.      UC Treatments / Results  Labs (all labs ordered are listed, but only abnormal results are displayed) Labs Reviewed - No data to display  EKG   Radiology No results found.  Procedures Procedures (including critical care time)  Medications Ordered in UC Medications - No data to display  Initial Impression / Assessment and Plan / UC Course  I have reviewed the triage vital signs and the nursing notes.  Pertinent labs & imaging results that were available during my care of the patient were reviewed by me and considered in my medical decision making (see chart for details).     Patient is well-appearing, afebrile, nontoxic, nontachycardic.  Unclear etiology of symptoms.  We discussed that it is possible that the erythema is related to positioning but she reports that the way that her legs lie there is never any pressure on the medial portion of her ankle.  She does have increased swelling and we discussed that this is likely dependent and chronic but given it is more prominent on the left side and she is at risk for VTE event due to chronic immobility we will obtain ultrasound to rule out DVT.  An ultrasound order was placed and she will have this done tomorrow on 01/02/2024.  We also ordered  x-ray of foot and ankle for additional evaluation in case it injury occurred during transport that we are unaware of.  Given the erythema we will cover for cellulitis with doxycycline  twice daily for 7 days.  We discussed that she is to avoid prolonged sun exposure.  Recommended close  follow-up with her primary care if all of her workup is negative and her symptoms persist.  We discussed that if anything worsens or changes she needs to be seen immediately.  Strict return precautions given.    Final Clinical Impressions(s) / UC Diagnoses   Final diagnoses:  Left leg swelling  Erythema of foot  Incomplete quadriplegia at C5-6 level [facility])     Discharge Instructions      Please check in at 2:45PM tomorrow 01/02/2024 at the radiology department at [facility] for your Ultrasound and x-rays.   We are going to start an antibiotic to cover for infection.  Start doxycycline  100 mg twice daily for 7 days.  Stay out of the sun while on this medication.  Continue using your compression stocking keep your leg elevated is much as possible.  Please go get the x-rays and ultrasound tomorrow as we discussed.  If anything worsens overnight you have increasing pain, swelling, chest pain, shortness of breath, lightheadedness, weakness, fever you need to be seen immediately.  Follow-up with your primary care next week.     ED Prescriptions     Medication Sig Dispense Auth. Provider   doxycycline  (VIBRAMYCIN ) 100 MG capsule Take 1 capsule (100 mg total) by mouth 2 (two) times daily. 14 capsule Navia Lindahl K, PA-C      PDMP not reviewed this encounter.   Sherrell Rocky POUR, PA-C 01/01/24 1245

## 2024-01-02 ENCOUNTER — Ambulatory Visit (HOSPITAL_BASED_OUTPATIENT_CLINIC_OR_DEPARTMENT_OTHER)
Admission: RE | Admit: 2024-01-02 | Discharge: 2024-01-02 | Disposition: A | Discharge status: Home / Self Care | Source: Ambulatory Visit | Attending: Physician Assistant | Admitting: Physician Assistant

## 2024-01-02 DIAGNOSIS — R600 Localized edema: Secondary | ICD-10-CM | POA: Diagnosis not present

## 2024-01-02 DIAGNOSIS — M19072 Primary osteoarthritis, left ankle and foot: Secondary | ICD-10-CM | POA: Diagnosis not present

## 2024-01-02 DIAGNOSIS — M2012 Hallux valgus (acquired), left foot: Secondary | ICD-10-CM | POA: Diagnosis not present

## 2024-01-02 DIAGNOSIS — R253 Fasciculation: Secondary | ICD-10-CM | POA: Insufficient documentation

## 2024-01-02 DIAGNOSIS — M7732 Calcaneal spur, left foot: Secondary | ICD-10-CM | POA: Diagnosis not present

## 2024-01-02 DIAGNOSIS — G8250 Quadriplegia, unspecified: Secondary | ICD-10-CM | POA: Diagnosis not present

## 2024-01-02 DIAGNOSIS — M7989 Other specified soft tissue disorders: Secondary | ICD-10-CM | POA: Diagnosis not present

## 2024-01-03 ENCOUNTER — Ambulatory Visit (HOSPITAL_COMMUNITY): Payer: Self-pay

## 2024-01-03 NOTE — Telephone Encounter (Signed)
 Venous ultrasound was negative.  Left foot imaging reveals degenerative arthritis and soft tissue swelling.  No change to current plan of care.

## 2024-01-05 ENCOUNTER — Telehealth: Payer: Self-pay

## 2024-01-05 NOTE — Telephone Encounter (Signed)
 Copied from CRM #[id number]. Topic: General - Other >> Jan 05, 2024  8:34 AM Thersia BROCKS wrote: Reason for CRM: Patient called in regarding being in the urgent care this week,patient stated they advised her to be seen by her primary care next week, patient has a appointment with NP Harlene Jolly on 08/20, wanted to know if she could wait until then or if she needs to be seen sooner, would like a callback if needs to be seen sooner

## 2024-01-05 NOTE — Telephone Encounter (Signed)
 Patient was advised she could come in next week to follow-up or she can wait for her next appointment with Harlene Wheeler CHOL. Patient states she will wait for her follow-up appointment.

## 2024-01-18 ENCOUNTER — Other Ambulatory Visit: Payer: Self-pay | Admitting: Family Medicine

## 2024-01-18 DIAGNOSIS — I10 Essential (primary) hypertension: Secondary | ICD-10-CM

## 2024-01-23 DIAGNOSIS — G8250 Quadriplegia, unspecified: Secondary | ICD-10-CM | POA: Diagnosis not present

## 2024-01-23 DIAGNOSIS — R32 Unspecified urinary incontinence: Secondary | ICD-10-CM | POA: Diagnosis not present

## 2024-01-23 DIAGNOSIS — N390 Urinary tract infection, site not specified: Secondary | ICD-10-CM | POA: Diagnosis not present

## 2024-01-23 DIAGNOSIS — Z933 Colostomy status: Secondary | ICD-10-CM | POA: Diagnosis not present

## 2024-01-26 DIAGNOSIS — E119 Type 2 diabetes mellitus without complications: Secondary | ICD-10-CM | POA: Insufficient documentation

## 2024-01-26 DIAGNOSIS — G8250 Quadriplegia, unspecified: Secondary | ICD-10-CM | POA: Insufficient documentation

## 2024-01-26 NOTE — Progress Notes (Addendum)
 Subjective:     Patient ID: [name], female    DOB: 1951-03-26, 73 y.o.   MRN: [id number]  Chief Complaint  Patient presents with   Follow-up    HPI  [name] is a 73 year old female presents for follow-up chronic conditions. PMHx-  incomplete quadriplegia- has been in a wheelchair for 44 years quadriplegia, neurogenic bladder-urinary catheter, OSA on CPAP, obesity, HTN, HLD. She lives at home alone, and still driving. Follows with pulmonology, dermatology, ophthalmology. 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  Patient was seen in July by urgent care for bilateral lower extremity edema L>R.  Urgent care notes erythema on heels.  Patient was given course of doxycycline .  Venous ultrasound was negative for DVT Left foot imaging revealed degenerative arthritis and soft tissue swelling.  Patient reports increased muscle spasms in the left leg since April. She notes that when in bed, her left foot rolls outward more than usual. She denies associated pain but describes abnormal sensations "on the inside" of the leg.  Symptoms have worsened, with restlessness and uncontrolled spasms in the left leg occurring primarily at night when lying down, but also happens someone's during the day.    Urinary Catheter  changed on Monday- Leaking   Neurogenic Bladder with Urinary Catheter-- Still following with [facility] Dr. Brunetta  HTN Lisinopril  5 mg daily, HCTZ 12.5 mg daily  HLD-rosuvastatin  5 mg daily  Obesity- semaglutide  0.5 mg injection weekly   Patient denies fever, chills, SOB, CP, palpitations, dyspnea, edema, HA, vision changes, N/V/D, abdominal pain, rash, weight changes, and recent illness or hospitalizations.   History of Present Illness              Health Maintenance Due  Topic Date Due   FOOT EXAM  Never done   Diabetic kidney evaluation - Urine ACR  Never done    Past Medical History:  Diagnosis  Date   Acute bronchitis 05/16/2015   Anemia    after shoulder replacement   Autonomic dysreflexia    AVM (arteriovenous malformation) spine 1981   Bowel dysfunction    constipation and fecal retention   Cerumen impaction 10/11/2015   Complication of anesthesia    low temperature after general anesthesia for colostomy   Constipation 01/25/2016   Contusion 05/18/2014   Of foot   Depression 02/13/2017   History of    Headache 04/09/2017   History of kidney stones    History of neurogenic bowel    History of rotator cuff tear    Right   History of ventral hernia    Hyperglycemia 05/18/2015   Hyperlipidemia    Hypertension    No medications at this time   Incomplete quadriplegia at C5-6 level (HCC) 07/24/2008   Qualifier: Diagnosis of  By: Georgian ROSALEA CHARM Lamar Requires bowel regimen including enemas and indwelling foley cath Has had tendency towards pressure sores in past. Had a tear with a transfer in Hospital in past for surgery and developed a sore at that time.    Low blood pressure reading    Occassionally   Medicare annual wellness visit, subsequent 11/02/2014   Follows with [facility] Declines colonoscopy  No h/o abnormal pap, declines further paps   MVP (mitral valve prolapse) 12/01/2012   Mild   Neurogenic bladder    OA (osteoarthritis)    Hip and shoulders, neck   Obesity 11/01/2016   OSA on CPAP 01/31/2016  Pain in joint, shoulder region 12/01/2012   Long history B/l Follow[facility] Dr Gladis [facility]   Postmenopausal bleeding 12/17/2012   Preventative health care 06/26/2013   Quadriplegia (HCC) 1981   Secondary to ruptured AVM C5-C6   Sacral pressure ulcer 12/01/2012   H/o Follows with Dr Todd Molt of Dermatology   Skin cancer 11/01/2016   2 BCC RLE excised by Dr Rolan Molt 2018 1SCC LLE excised by Dr Rolan Molt 2018   Vaginitis and vulvovaginitis 12/17/2012   Wears glasses     Past Surgical History:  Procedure Laterality Date   ANAL TAG REMOVAL  2014    BASAL CELL CARCINOMA EXCISION  2016   Lip   BASAL CELL CARCINOMA EXCISION  2018   right leg x2   BASAL CELL CARCINOMA EXCISION Left 2018   leg, chest   BASAL CELL CARCINOMA EXCISION Right 2019   leg   BLADDER STONE REMOVAL  1986/87   COLONOSCOPY  02/22/2016   COLOSTOMY  02/22/2016   CYSTOSCOPY WITH BIOPSY N/A 06/21/2018   Procedure: CYSTOSCOPY WITH BIOPSY FULGURATION;  Surgeon: Watt Rush, MD;  Location[facility];  Service: Urology;  Laterality: N/A;   CYSTOSCOPY WITH LITHOLAPAXY N/A 06/21/2018   Procedure: CYSTOSCOPY WITH LITHOLAPAXY;  Surgeon: Watt Rush, MD;  Location: [facility];  Service: Urology;  Laterality: N/A;   DORSAL CHEILECTOMY-HALLUX RIGIDUS Bilateral 2011   HERNIA REPAIR  2012   VENTRAL umbilical hernia repair   LAMINECTOMY  10/23/1979   AVM   LAPAROSCOPIC SIGMOID COLECTOMY  02/22/2016   ROTATOR CUFF REPAIR  1996, 1997   left shoulder   ROTATOR CUFF REPAIR Right 2001   SHOULDER ARTHROSCOPY  2007   right   SQUAMOUS CELL CARCINOMA EXCISION Left 2018   leg   Toe Nails Removal Bilateral 1981   both great toes   TOTAL SHOULDER REPLACEMENT  2001   left   TRANSFER TENDON HAND  1982, 1983   to right hand X3   VENTRAL HERNIA REPAIR  08/17/2010   Postop ileus (Dr. Tanda)    Family History  Problem Relation Age of Onset   Cancer Mother        breast & melanoma   Hypertension Mother    Heart disease Father        CAD, MI, AFib   Cancer Sister        melanoma   Dementia Sister        Alzheimer's   Cancer Maternal Aunt        breast   Cancer Paternal Aunt        breast   Cancer Paternal Aunt        breast   Cancer Paternal Uncle        breast   Cancer Maternal Grandfather        melanoma   Stroke Paternal Grandmother    Heart disease Paternal Grandfather     Social History   Socioeconomic History   Marital status: Single    Spouse name: Not on file   Number of children: Not on file   Years of education: Not on file    Highest education level: Bachelor's degree (e.g., BA, AB, BS)  Occupational History   Occupation: Disabled    Comment: Editor, commissioning  Tobacco Use   Smoking status: Never    Passive exposure: Never   Smokeless tobacco: Never  Vaping Use   Vaping status: Never Used  Substance and  Sexual Activity   Alcohol use: No   Drug use: No   Sexual activity: Not on file    Comment: lives with sister and mother, no major dietary restrictions  Other Topics Concern   Not on file  Social History Narrative   Sister is primary caregiver   Social Drivers of Health   Financial Resource Strain: Low Risk  (01/30/2024)   Overall Financial Resource Strain (CARDIA)    Difficulty of Paying Living Expenses: Not very hard  Food Insecurity: No Food Insecurity (01/30/2024)   Hunger Vital Sign    Worried About Running Out of Food in the Last Year: Never true    Ran Out of Food in the Last Year: Never true  Transportation Needs: No Transportation Needs (01/30/2024)   PRAPARE - Administrator, Civil Service (Medical): No    Lack of Transportation (Non-Medical): No  Physical Activity: Insufficiently Active (01/30/2024)   Exercise Vital Sign    Days of Exercise per Week: 6 days    Minutes of Exercise per Session: 20 min  Stress: Stress Concern Present (01/30/2024)   [employer] of Occupational Health - Occupational Stress Questionnaire    Feeling of Stress: To some extent  Social Connections: Moderately Isolated (01/30/2024)   Social Connection and Isolation Panel    Frequency of Communication with Friends and Family: Three times a week    Frequency of Social Gatherings with Friends and Family: More than three times a week    Attends Religious Services: More than 4 times per year    Active Member of [employer] or Organizations: No    Attends Engineer, structural: Not on file    Marital Status: Never married  Intimate Partner Violence: Not At Risk (07/13/2023)   Humiliation, Afraid, Rape,  and Kick questionnaire    Fear of Current or Ex-Partner: No    Emotionally Abused: No    Physically Abused: No    Sexually Abused: No    Outpatient Medications Prior to Visit  Medication Sig Dispense Refill   baclofen  (LIORESAL ) 20 MG tablet TAKE 1 TABLET BY MOUTH TWICE DAILY 180 tablet 0   clobetasol (TEMOVATE) 0.05 % external solution Apply 1 application topically every Monday, Wednesday, and Friday. Use on scalp 3 times per week     docusate sodium  (COLACE) 100 MG capsule Take 1 capsule (100 mg total) by mouth daily. 90 capsule 2   ELDERBERRY PO Take 2 each by mouth daily.     FIBER PO Take 2 capsules by mouth daily. (Patient taking differently: Take 1 capsule by mouth 2 (two) times daily.)     hydrochlorothiazide  (MICROZIDE ) 12.5 MG capsule Take 1 capsule (12.5 mg total) by mouth daily. 90 capsule 1   lisinopril  (ZESTRIL ) 5 MG tablet TAKE 1 TABLET BY MOUTH DAILY 90 tablet 1   Multiple Vitamins-Calcium  (ONE-A-DAY WOMENS FORMULA PO) Take 1 tablet by mouth daily.     oxybutynin  (DITROPAN -XL) 10 MG 24 hr tablet Take 10 mg by mouth 2 (two) times daily.     Probiotic Product (DIGESTIVE ADVANTAGE) CAPS Take 1 tablet by mouth daily.     rosuvastatin  (CRESTOR ) 5 MG tablet Take 1 tablet (5 mg total) by mouth daily. 90 tablet 3   Semaglutide ,0.25 or 0.5MG /DOS, (OZEMPIC , 0.25 OR 0.5 MG/DOSE,) 2 MG/3ML SOPN Inject 0.5 mg into the skin once a week. 3 mL 1   Cholecalciferol (VITAMIN D3) 1000 units CAPS Take 1 capsule by mouth daily.     doxycycline  (VIBRAMYCIN ) 100  MG capsule Take 1 capsule (100 mg total) by mouth 2 (two) times daily. 14 capsule 0   OXYBUTYNIN  CHLORIDE ER PO Take 20 mg by mouth at bedtime. (Patient taking differently: Take 20 mg by mouth 2 (two) times daily.)     No facility-administered medications prior to visit.    Allergies  Allergen Reactions   Adhesive [Tape] Other (See Comments)    Skin irritation Does not tolerate [employer], tolerates Hollister    ROS   See  HPI Objective:    Physical Exam General: No acute distress. Awake and conversant.  Eyes: Normal conjunctiva, anicteric. Round symmetric pupils.  ENT: Hearing grossly intact. No nasal discharge.  Neck: Neck is supple. No masses or thyromegaly.  Respiratory: CTAB. Respirations are non-labored. No wheezing.  Skin: Warm. No rashes or ulcers.  Psych: Alert and oriented. Cooperative, Appropriate mood and affect, Normal judgment.  CV: RRR. No murmur. No lower extremity edema.  MSK: No clubbing or cyanosis. In wheelchair. Limited ROM left shoulder. Neuro:      Cranial Nerves: No cranial nerve deficit.     Sensory: Sensory deficit present.     Motor: Weakness present.     Coordination: Coordination abnormal.     Gait: Gait abnormal.     Deep Tendon Reflexes: Reflexes abnormal  BP 126/76 (BP Location: Left Arm, Patient Position: Sitting, Cuff Size: Large)   Pulse 70   Temp 97.6 F (36.4 C) (Oral)   Resp 12   Ht 5' 11.5 (1.816 m)   Wt 194 lb 8 oz (88.2 kg) Comment: weighed at home  SpO2 98%   BMI 26.75 kg/m  Wt Readings from Last 3 Encounters:  01/31/24 194 lb 8 oz (88.2 kg)  01/01/24 220 lb 14.4 oz (100.2 kg)  10/05/23 221 lb (100.2 kg)       Assessment & Plan:   Problem List Items Addressed This Visit     Diabetes mellitus without complication (HCC) - Primary   hgba1c acceptable, minimize simple carbs. Increase exercise as tolerated. Continue current meds       Relevant Orders   Urine Microalbumin w/creat. ratio   Essential hypertension   Well controlled, no changes to meds. Encouraged heart healthy diet such as the DASH diet and exercise as tolerated.         External rotation of left foot   Relevant Orders   DG HIP UNILAT W OR W/O PELVIS 2-3 VIEWS LEFT   Hip discomfort, left   X-ray pending. Rx-gabapentin  100 mg at bedtime.  Consider additional dose during the day if patient finds this helpful for her leg discomfort.       Relevant Medications   gabapentin   (NEURONTIN ) 100 MG capsule   Hyperlipidemia   Encouraged heart healthy diet, increase exercise, avoid trans fats, consider a krill oil cap daily        Incomplete quadriplegia at C5-6 level [facility])   Patient continues to drive and uses a power chair for ADLs. She receives home health care twice daily and reports performing upper extremity exercises at home using light weights. Encouraged her to continue these activities.      Neurogenic bladder   Indwelling catheter, managed by------------------ denies recent complications.       Relevant Orders   POCT urinalysis dipstick   OSA (obstructive sleep apnea)   On CPAP. Follows with Pulmonary.       Preventative health care   Patient encouraged to maintain heart healthy diet, regular exercise, adequate sleep. Consider daily  probiotics. Take medications as prescribed. Labs ordered and reviewed. Has ACP documents in chart [facility] 02/2022 repeat every 1-2 years       Other Visit Diagnoses       Other urinary incontinence       Relevant Medications   oxybutynin  (DITROPAN -XL) 10 MG 24 hr tablet   Other Relevant Orders   Urine Culture   POCT urinalysis dipstick      FU 4 months    Portions of this note were dictated using DRAGON voice recognition software. Please disregard any errors in transcription.    Catheter-related complications - Obtain recent urology notes and urine analysis results. Patient notes leaking around catheter;  Urine culture showed Serratia Marcescens Rx- Bactrim  DS x 3 days, Repeat UA and Urine Culture next week to ensure infection has cleared. Ensure adequate hydration. Advise Pt to FU with Urology, consider prophylactic daily abx for UTI prevention and/ or more frequent urine testing.   Dementia risk Concerned about dementia due to family history. Discussed proactive measures: mental stimulation, social engagement, and dietary considerations. - Encourage mental challenges such as learning new skills or  engaging in puzzles. - Ensure adequate hydration.   General Health Maintenance Maintaining general health with regular screenings and lifestyle modifications. - Continue regular screenings as per schedule. - Maintain current hydration levels. - Follow a balanced diet  I have discontinued Jessicalynn A. Worley's OXYBUTYNIN  CHLORIDE ER PO and doxycycline . I am also having her start on gabapentin . Additionally, I am having her maintain her Multiple Vitamins-Calcium  (ONE-A-DAY WOMENS FORMULA PO), clobetasol, Digestive Advantage, FIBER PO, ELDERBERRY PO, docusate sodium , rosuvastatin , hydrochlorothiazide , Ozempic  (0.25 or 0.5 MG/DOSE), lisinopril , baclofen , Vitamin D3, and oxybutynin .  Meds ordered this encounter  Medications   gabapentin  (NEURONTIN ) 100 MG capsule    Sig: Take 1 capsule (100 mg total) by mouth at bedtime.    Dispense:  30 capsule    Refill:  3    Supervising Provider:   DOMENICA BLACKBIRD A [[id number]]

## 2024-01-26 NOTE — Assessment & Plan Note (Signed)
 Patient encouraged to maintain heart healthy diet, regular exercise, adequate sleep. Consider daily probiotics. Take medications as prescribed. Labs ordered and reviewed. Has ACP documents in chart [facility] 02/2022 repeat every 1-2 years

## 2024-01-27 ENCOUNTER — Other Ambulatory Visit: Payer: Self-pay | Admitting: Family Medicine

## 2024-01-28 NOTE — Assessment & Plan Note (Signed)
 Encouraged heart healthy diet, increase exercise, avoid trans fats, consider a krill oil cap daily

## 2024-01-28 NOTE — Assessment & Plan Note (Signed)
 Well controlled, no changes to meds. Encouraged heart healthy diet such as the DASH diet and exercise as tolerated.

## 2024-01-28 NOTE — Assessment & Plan Note (Signed)
 On CPAP. Follows with Pulmonary.

## 2024-01-28 NOTE — Assessment & Plan Note (Addendum)
 Indwelling catheter, managed by------------------ denies recent complications.

## 2024-01-28 NOTE — Assessment & Plan Note (Signed)
 hgba1c acceptable, minimize simple carbs. Increase exercise as tolerated. Continue current meds

## 2024-01-30 ENCOUNTER — Telehealth: Payer: Self-pay | Admitting: Family Medicine

## 2024-01-30 NOTE — Telephone Encounter (Signed)
 noted

## 2024-01-30 NOTE — Telephone Encounter (Signed)
 Pt called to say she wants to bring her urine sample with her due to her having catheter. I told her yes. I thought with this it would be okay. If this is not okay I will call her to let her know.

## 2024-01-31 ENCOUNTER — Encounter: Payer: Self-pay | Admitting: Student

## 2024-01-31 ENCOUNTER — Ambulatory Visit (HOSPITAL_BASED_OUTPATIENT_CLINIC_OR_DEPARTMENT_OTHER)
Admission: RE | Admit: 2024-01-31 | Discharge: 2024-01-31 | Disposition: A | Discharge status: Home / Self Care | Source: Ambulatory Visit | Attending: Student | Admitting: Student

## 2024-01-31 ENCOUNTER — Ambulatory Visit: Admitting: Student

## 2024-01-31 VITALS — BP 126/76 | HR 70 | Temp 97.6°F | Resp 12 | Ht 71.5 in | Wt 194.5 lb

## 2024-01-31 DIAGNOSIS — N39498 Other specified urinary incontinence: Secondary | ICD-10-CM | POA: Diagnosis not present

## 2024-01-31 DIAGNOSIS — Z0000 Encounter for general adult medical examination without abnormal findings: Secondary | ICD-10-CM

## 2024-01-31 DIAGNOSIS — M216X2 Other acquired deformities of left foot: Secondary | ICD-10-CM | POA: Insufficient documentation

## 2024-01-31 DIAGNOSIS — M160 Bilateral primary osteoarthritis of hip: Secondary | ICD-10-CM | POA: Diagnosis not present

## 2024-01-31 DIAGNOSIS — M25552 Pain in left hip: Secondary | ICD-10-CM | POA: Diagnosis not present

## 2024-01-31 DIAGNOSIS — G4733 Obstructive sleep apnea (adult) (pediatric): Secondary | ICD-10-CM | POA: Diagnosis not present

## 2024-01-31 DIAGNOSIS — N319 Neuromuscular dysfunction of bladder, unspecified: Secondary | ICD-10-CM | POA: Diagnosis not present

## 2024-01-31 DIAGNOSIS — E119 Type 2 diabetes mellitus without complications: Secondary | ICD-10-CM | POA: Diagnosis not present

## 2024-01-31 DIAGNOSIS — G8254 Quadriplegia, C5-C7 incomplete: Secondary | ICD-10-CM

## 2024-01-31 DIAGNOSIS — I10 Essential (primary) hypertension: Secondary | ICD-10-CM

## 2024-01-31 DIAGNOSIS — E782 Mixed hyperlipidemia: Secondary | ICD-10-CM

## 2024-01-31 DIAGNOSIS — Z7985 Long-term (current) use of injectable non-insulin antidiabetic drugs: Secondary | ICD-10-CM

## 2024-01-31 LAB — POCT URINALYSIS DIP (MANUAL ENTRY)
Bilirubin, UA: NEGATIVE
Glucose, UA: NEGATIVE mg/dL
Ketones, POC UA: NEGATIVE mg/dL
Leukocytes, UA: NEGATIVE
Nitrite, UA: NEGATIVE
Protein Ur, POC: NEGATIVE mg/dL
Spec Grav, UA: <=1.005 — AB (ref 1.010–1.025)
Urobilinogen, UA: 0.2 U/dL
pH, UA: 6.5 (ref 5.0–8.0)

## 2024-01-31 LAB — MICROALBUMIN / CREATININE URINE RATIO
Creatinine,U: 17.7 mg/dL
Microalb Creat Ratio: 50 mg/g — ABNORMAL HIGH (ref 0.0–30.0)
Microalb, Ur: 0.9 mg/dL (ref 0.0–1.9)

## 2024-01-31 MED ORDER — GABAPENTIN 100 MG PO CAPS: 100.0000 mg | ORAL_CAPSULE | Freq: Every day | ORAL | 3 refills | Status: DC

## 2024-01-31 NOTE — Assessment & Plan Note (Addendum)
 Patient continues to drive and uses a power chair for ADLs. She receives home health care twice daily and reports performing upper extremity exercises at home using light weights. Encouraged her to continue these activities.

## 2024-01-31 NOTE — Assessment & Plan Note (Addendum)
 X-ray pending. Rx-gabapentin  100 mg at bedtime.  Consider additional dose during the day if patient finds this helpful for her leg discomfort.

## 2024-02-04 LAB — URINE CULTURE
MICRO NUMBER:: 16858450
SPECIMEN QUALITY:: ADEQUATE

## 2024-02-05 ENCOUNTER — Telehealth: Payer: Self-pay

## 2024-02-05 ENCOUNTER — Ambulatory Visit: Payer: Medicare PPO | Admitting: Internal Medicine

## 2024-02-05 NOTE — Telephone Encounter (Signed)
 Copied from CRM #[id number]. Topic: Clinical - Lab/Test Results >> Feb 05, 2024 12:57 PM Franky GRADE wrote: Reason for CRM: Patient is calling for lab and imaging results she had done on 01/31/2024. She was able to review results on mychart but wanted to go over them with either Dr.Blyth or her MA.

## 2024-02-06 ENCOUNTER — Ambulatory Visit: Payer: Self-pay | Admitting: Student

## 2024-02-06 ENCOUNTER — Other Ambulatory Visit: Payer: Self-pay | Admitting: *Deleted

## 2024-02-06 DIAGNOSIS — M62838 Other muscle spasm: Secondary | ICD-10-CM

## 2024-02-06 DIAGNOSIS — G8254 Quadriplegia, C5-C7 incomplete: Secondary | ICD-10-CM

## 2024-02-06 DIAGNOSIS — N390 Urinary tract infection, site not specified: Secondary | ICD-10-CM

## 2024-02-06 MED ORDER — SULFAMETHOXAZOLE-TRIMETHOPRIM 800-160 MG PO TABS: 1.0000 | ORAL_TABLET | Freq: Two times a day (BID) | ORAL | 0 refills | Status: AC

## 2024-02-06 NOTE — Telephone Encounter (Signed)
 See mychart message.  [name] sent in another medication.

## 2024-02-07 ENCOUNTER — Encounter: Payer: Self-pay | Admitting: *Deleted

## 2024-02-07 DIAGNOSIS — G8250 Quadriplegia, unspecified: Secondary | ICD-10-CM | POA: Diagnosis not present

## 2024-02-07 DIAGNOSIS — N390 Urinary tract infection, site not specified: Secondary | ICD-10-CM | POA: Diagnosis not present

## 2024-02-07 DIAGNOSIS — R32 Unspecified urinary incontinence: Secondary | ICD-10-CM | POA: Diagnosis not present

## 2024-02-07 DIAGNOSIS — Z933 Colostomy status: Secondary | ICD-10-CM | POA: Diagnosis not present

## 2024-02-07 NOTE — Progress Notes (Signed)
 HPI F never smoker followed for OSA, complicated by Mitral Prolapse, HTN, AVM Spine, Quadriplegia C 5-6(partial), Neurogenic Bowel/ Colostomy,  Alopecia, Anemia, Rhinitis, HST 10/06/15- AHI 20.4, desaturation to 79%, body weight 220 lbs  =============================================================   02/06/23- 72 yoF never smoker followed for OSA, complicated by Mitral Prolapse, HTN, AVM Spine, Quadriplegia C 5-6(partial), Neurogenic Bowel/ Colostomy,  Alopecia, Anemia, Rhinitis, CPAP  auto 5-20   / [employer]       replacement ordered 01/25/21  AirSense11AutoSet Download compliance-    100%, AHI 0.8/hr Body weight today 225 lbs Download reviewed. Denies problems. Headgear occ slips, but her hands work well enough to manage it. Denies breathing problems or other concerns for today.  02/08/24- 73 yoF never smoker followed for OSA, complicated by Mitral Prolapse, HTN, AVM Spine, Quadriplegia C 5-6(partial), Neurogenic Bowel/ Colostomy,  Alopecia, Anemia, Rhinitis, CPAP  auto 5-20   / [employer]       replacement ordered 01/25/21  AirSense11AutoSet Download compliance-    100%, AHI 0.6/hr Body weight today 194 lbs -----Sleeping well.  Using CPAP nightly. Discussed the use of AI scribe software for clinical note transcription with the patient, who gave verbal consent to proceed.  History of Present Illness   [name] is a 73 year old female with sleep apnea who presents for a follow-up regarding CPAP use.  She is sleeping well and is comfortable with her CPAP machine. The headgear occasionally slips, but she adjusts it herself. She recently replaced the power cord, which may have temporarily affected usage data. She uses the CPAP every night, achieving seven to eight hours of sleep with less than one breakthrough apnea per hour. No new concerns or changes in her condition. She continues wheel-chair dependent. She still has enough hand function to manage her CPAP.     Assessment and Plan:    Obstructive  sleep apnea Obstructive sleep apnea well-managed with CPAP. Excellent control with less than one apnea per hour. Comfortable with CPAP, sleeping 7-8 hours per night. Treatment goals met. - Continue CPAP therapy. - Schedule follow-up in one year.   Quadriplegia-partial 5-6 -Continues wheelchair     ROS-see HPI   + = positive Constitutional:    weight loss, night sweats, fevers, chills, fatigue, lassitude. HEENT:    headaches, difficulty swallowing, tooth/dental problems, sore throat,       sneezing, itching, ear ache, nasal congestion, post nasal drip, snoring CV:    chest pain, orthopnea, PND, swelling in lower extremities, anasarca,                                   dizziness, palpitations Resp:   shortness of breath with exertion or at rest.                productive cough,   non-productive cough, coughing up of blood.              change in color of mucus.  wheezing.   Skin:    rash or lesions. GI:  No-   heartburn, indigestion, abdominal pain, nausea, vomiting, diarrhea,                 change in bowel habits, loss of appetite GU: dysuria, change in color of urine, no urgency or frequency.   flank pain. MS:   joint pain, stiffness, decreased range of motion, back pain. Neuro-     nothing unusual Psych:  change  in mood or affect.  depression or anxiety.   memory loss.  OBJ- Physical Exam General- Alert, Oriented, Affect-appropriate, Distress- none acute, + power wheelchair Skin- rash-none, lesions- none, excoriation- none Lymphadenopathy- none Head- atraumatic            Eyes- Gross vision intact, PERRLA, conjunctivae and secretions clear            Ears- Hearing, canals-normal            Nose- Clear, no-Septal dev, mucus, polyps, erosion, perforation             Throat- Mallampati II I-IV, mucosa clear , drainage- none, tonsils- atrophic Neck- flexible , trachea midline, no stridor , thyroid  nl, carotid no bruit Chest - symmetrical excursion , unlabored           Heart/CV-  RRR , no murmur , no gallop  , no rub, nl s1 s2                           - JVD- none , edema- none, stasis changes- none, varices- none           Lung- clear to P&A, wheeze- none, cough- none , dullness-none, rub- none           Chest wall-  Abd-  Br/ Gen/ Rectal- Not done, not indicated Extrem-+hand contractures/ limited use/ +surgical scars Neuro- +paraplegic

## 2024-02-08 ENCOUNTER — Encounter: Payer: Self-pay | Admitting: Internal Medicine

## 2024-02-08 ENCOUNTER — Ambulatory Visit (INDEPENDENT_AMBULATORY_CARE_PROVIDER_SITE_OTHER): Admitting: Internal Medicine

## 2024-02-08 VITALS — BP 114/60 | HR 65 | Temp 98.1°F | Ht 71.5 in | Wt 194.5 lb

## 2024-02-08 DIAGNOSIS — G4733 Obstructive sleep apnea (adult) (pediatric): Secondary | ICD-10-CM | POA: Diagnosis not present

## 2024-02-08 DIAGNOSIS — G8254 Quadriplegia, C5-C7 incomplete: Secondary | ICD-10-CM

## 2024-02-08 NOTE — Patient Instructions (Signed)
 Glad you are doing well. We can continue auto 5-20. Please let us  know if we can help.

## 2024-02-09 ENCOUNTER — Telehealth: Payer: Self-pay

## 2024-02-09 NOTE — Telephone Encounter (Signed)
 Copied from CRM [id number]. Topic: Clinical - Lab/Test Results >> Feb 09, 2024  9:13 AM Tonda B wrote: Reason for CRM: pt is calling in to go over her imaging results  Please call pt back  [phone] (H

## 2024-02-13 ENCOUNTER — Other Ambulatory Visit (INDEPENDENT_AMBULATORY_CARE_PROVIDER_SITE_OTHER)

## 2024-02-13 DIAGNOSIS — N390 Urinary tract infection, site not specified: Secondary | ICD-10-CM | POA: Diagnosis not present

## 2024-02-13 DIAGNOSIS — T83511A Infection and inflammatory reaction due to indwelling urethral catheter, initial encounter: Secondary | ICD-10-CM | POA: Diagnosis not present

## 2024-02-13 LAB — URINALYSIS, ROUTINE W REFLEX MICROSCOPIC
Bilirubin Urine: NEGATIVE
Hgb urine dipstick: NEGATIVE
Ketones, ur: NEGATIVE
Leukocytes,Ua: NEGATIVE
Nitrite: POSITIVE — AB
RBC / HPF: NONE SEEN (ref 0–?)
Specific Gravity, Urine: <=1.005 — AB (ref 1.000–1.030)
Total Protein, Urine: NEGATIVE
Urine Glucose: NEGATIVE
Urobilinogen, UA: 0.2 (ref 0.0–1.0)
pH: 7 (ref 5.0–8.0)

## 2024-02-13 NOTE — Addendum Note (Signed)
 Addended by: Willette Mudry Y on: 02/13/2024 09:58 AM   Modules accepted: Orders

## 2024-02-17 ENCOUNTER — Other Ambulatory Visit: Payer: Self-pay | Admitting: Family Medicine

## 2024-02-17 LAB — URINE CULTURE
MICRO NUMBER:: 16909902
SPECIMEN QUALITY:: ADEQUATE

## 2024-02-19 ENCOUNTER — Ambulatory Visit: Payer: Self-pay | Admitting: Family Medicine

## 2024-02-19 MED ORDER — CEFDINIR 300 MG PO CAPS: 300.0000 mg | ORAL_CAPSULE | Freq: Two times a day (BID) | ORAL | 0 refills | Status: DC

## 2024-02-19 MED ORDER — NITROFURANTOIN MONOHYD MACRO 100 MG PO CAPS: 100.0000 mg | ORAL_CAPSULE | Freq: Two times a day (BID) | ORAL | 0 refills | Status: DC

## 2024-02-19 NOTE — Telephone Encounter (Signed)
 Copied from CRM [id number]. Topic: Clinical - Medication Question >> Feb 19, 2024 11:37 AM Revonda D wrote: Reason for CRM: Pt stated that she was messaged by [facility] regarding two medications she going to be prescribed. Pt would like for the Cefdinir  300 mg and the Nitrofurantoin  100 mg to be sent to the [facility] today and would like a callback with an update.

## 2024-02-19 NOTE — Telephone Encounter (Signed)
 Chart reviewed, Rx's sent.

## 2024-03-01 ENCOUNTER — Encounter: Payer: Self-pay | Admitting: Physical Medicine and Rehabilitation

## 2024-03-06 ENCOUNTER — Other Ambulatory Visit (INDEPENDENT_AMBULATORY_CARE_PROVIDER_SITE_OTHER)

## 2024-03-06 DIAGNOSIS — N390 Urinary tract infection, site not specified: Secondary | ICD-10-CM | POA: Diagnosis not present

## 2024-03-06 DIAGNOSIS — T83511A Infection and inflammatory reaction due to indwelling urethral catheter, initial encounter: Secondary | ICD-10-CM | POA: Diagnosis not present

## 2024-03-06 LAB — POCT URINALYSIS DIP (MANUAL ENTRY)
Bilirubin, UA: NEGATIVE
Blood, UA: NEGATIVE
Glucose, UA: NEGATIVE mg/dL
Ketones, POC UA: NEGATIVE mg/dL
Nitrite, UA: POSITIVE — AB
Protein Ur, POC: NEGATIVE mg/dL
Spec Grav, UA: <=1.005 — AB (ref 1.010–1.025)
Urobilinogen, UA: 0.2 U/dL
pH, UA: 7.5 (ref 5.0–8.0)

## 2024-03-06 NOTE — Progress Notes (Signed)
 Per pt [name] wanted her to recheck by 03/08/24 to make sure urine is clear.

## 2024-03-07 DIAGNOSIS — Z933 Colostomy status: Secondary | ICD-10-CM | POA: Diagnosis not present

## 2024-03-07 DIAGNOSIS — G8250 Quadriplegia, unspecified: Secondary | ICD-10-CM | POA: Diagnosis not present

## 2024-03-07 DIAGNOSIS — N390 Urinary tract infection, site not specified: Secondary | ICD-10-CM | POA: Diagnosis not present

## 2024-03-07 DIAGNOSIS — R32 Unspecified urinary incontinence: Secondary | ICD-10-CM | POA: Diagnosis not present

## 2024-03-07 LAB — URINE CULTURE
MICRO NUMBER:: 17010944
SPECIMEN QUALITY:: ADEQUATE

## 2024-03-08 ENCOUNTER — Ambulatory Visit: Payer: Self-pay | Admitting: Student

## 2024-03-14 ENCOUNTER — Other Ambulatory Visit: Payer: Self-pay | Admitting: Family Medicine

## 2024-03-14 ENCOUNTER — Encounter: Payer: Self-pay | Admitting: Family Medicine

## 2024-03-17 ENCOUNTER — Other Ambulatory Visit: Payer: Self-pay | Admitting: Family Medicine

## 2024-03-17 DIAGNOSIS — K649 Unspecified hemorrhoids: Secondary | ICD-10-CM

## 2024-04-01 ENCOUNTER — Other Ambulatory Visit: Payer: Self-pay | Admitting: Family Medicine

## 2024-04-01 MED ORDER — HYDROCHLOROTHIAZIDE 12.5 MG PO CAPS: 12.5000 mg | ORAL_CAPSULE | Freq: Every day | ORAL | 1 refills | Status: AC

## 2024-04-01 NOTE — Telephone Encounter (Signed)
 Copied from CRM [id number]. Topic: Clinical - Medication Refill >> Apr 01, 2024  8:34 AM Rea C wrote: Medication: hydrochlorothiazide  (MICROZIDE ) 12.5 MG capsule  Has the patient contacted their pharmacy? Yes  (Agent: If no, request that the patient contact the pharmacy for the refill. If patient does not wish to contact the pharmacy document the reason why and proceed with request.) (Agent: If yes, when and what did the pharmacy advise?)  This is the patient's preferred pharmacy:  [facility] - [place], [place] - [address] [place] [address] Phone: [phone] Fax: [phone]   Is this the correct pharmacy for this prescription? Yes If no, delete pharmacy and type the correct one.   Has the prescription been filled recently? Yes  Is the patient out of the medication? 1 pill left   Has the patient been seen for an appointment in the last year OR does the patient have an upcoming appointment?   Can we respond through MyChart? Yes   Agent: Please be advised that Rx refills may take up to 3 business days. We ask that you follow-up with your pharmacy.

## 2024-04-10 ENCOUNTER — Encounter: Payer: Self-pay | Admitting: Family Medicine

## 2024-04-10 DIAGNOSIS — H1045 Other chronic allergic conjunctivitis: Secondary | ICD-10-CM | POA: Diagnosis not present

## 2024-04-10 DIAGNOSIS — H43393 Other vitreous opacities, bilateral: Secondary | ICD-10-CM | POA: Diagnosis not present

## 2024-04-10 DIAGNOSIS — H02824 Cysts of left upper eyelid: Secondary | ICD-10-CM | POA: Diagnosis not present

## 2024-04-10 DIAGNOSIS — H25813 Combined forms of age-related cataract, bilateral: Secondary | ICD-10-CM | POA: Diagnosis not present

## 2024-04-10 DIAGNOSIS — H02834 Dermatochalasis of left upper eyelid: Secondary | ICD-10-CM | POA: Diagnosis not present

## 2024-04-10 DIAGNOSIS — H04123 Dry eye syndrome of bilateral lacrimal glands: Secondary | ICD-10-CM | POA: Diagnosis not present

## 2024-04-10 DIAGNOSIS — H02831 Dermatochalasis of right upper eyelid: Secondary | ICD-10-CM | POA: Diagnosis not present

## 2024-04-10 LAB — HM DIABETES EYE EXAM

## 2024-04-11 ENCOUNTER — Encounter: Payer: Self-pay | Admitting: Internal Medicine

## 2024-04-12 DIAGNOSIS — R32 Unspecified urinary incontinence: Secondary | ICD-10-CM | POA: Diagnosis not present

## 2024-04-12 DIAGNOSIS — G8250 Quadriplegia, unspecified: Secondary | ICD-10-CM | POA: Diagnosis not present

## 2024-04-12 DIAGNOSIS — Z933 Colostomy status: Secondary | ICD-10-CM | POA: Diagnosis not present

## 2024-04-12 DIAGNOSIS — N390 Urinary tract infection, site not specified: Secondary | ICD-10-CM | POA: Diagnosis not present

## 2024-04-15 ENCOUNTER — Encounter: Payer: Self-pay | Admitting: Family Medicine

## 2024-04-15 DIAGNOSIS — Z933 Colostomy status: Secondary | ICD-10-CM | POA: Diagnosis not present

## 2024-04-15 DIAGNOSIS — G8250 Quadriplegia, unspecified: Secondary | ICD-10-CM | POA: Diagnosis not present

## 2024-04-15 DIAGNOSIS — N390 Urinary tract infection, site not specified: Secondary | ICD-10-CM | POA: Diagnosis not present

## 2024-04-15 DIAGNOSIS — R32 Unspecified urinary incontinence: Secondary | ICD-10-CM | POA: Diagnosis not present

## 2024-04-15 NOTE — Telephone Encounter (Signed)
 Patient was scheduled w/ Jessica Yacopino,DNP on 04/18/24 @ 3:20 PM.

## 2024-04-18 ENCOUNTER — Telehealth: Admitting: Student

## 2024-04-18 ENCOUNTER — Encounter: Payer: Self-pay | Admitting: Student

## 2024-04-18 DIAGNOSIS — Z978 Presence of other specified devices: Secondary | ICD-10-CM | POA: Diagnosis not present

## 2024-04-18 DIAGNOSIS — G8254 Quadriplegia, C5-C7 incomplete: Secondary | ICD-10-CM | POA: Diagnosis not present

## 2024-04-18 DIAGNOSIS — N319 Neuromuscular dysfunction of bladder, unspecified: Secondary | ICD-10-CM

## 2024-04-18 NOTE — Progress Notes (Unsigned)
 MyChart Video Visit    Virtual Visit via Video Note    Patient location: Home. Patient and provider in visit Provider location: Office  I discussed the limitations of evaluation and management by telemedicine and the availability of in person appointments. The patient expressed understanding and agreed to proceed.  Visit Date: 04/18/2024  Today's healthcare provider: Harlene LITTIE Jolly, NP     Subjective:    Patient ID: [name], female    DOB: Jan 21, 1951, 73 y.o.   MRN: [id number]  No chief complaint on file.   HPI  History of Present Illness [name] is a 73 year old female with neurogenic bladder and indwelling urine foley catheter.  She has been unable to schedule appointments or contact her urologist due to her healthcare provider's new system. She is seen by Dr. Brunetta with [facility]. She wishes to switch to [facility] for catheter changes, as her current agency does not provide medical services. Receives four catheters monthly from [facility].  She uses a size 20 catheter.   Previously caregivers helped  with catheter changes; however, this arrangement is no longer ideal, medical services required for catheter changes.   Patient denies fever, chills, SOB, CP, palpitations, dyspnea, edema, HA, vision changes, N/V/D, abdominal pain, urinary symptoms, rash, weight changes, and recent illness or hospitalizations.    Past Medical History:  Diagnosis Date   Acute bronchitis 05/16/2015   Anemia    after shoulder replacement   Autonomic dysreflexia    AVM (arteriovenous malformation) spine 1981   Bowel dysfunction    constipation and fecal retention   Cerumen impaction 10/11/2015   Complication of anesthesia    low temperature after general anesthesia for colostomy   Constipation 01/25/2016   Contusion 05/18/2014   Of foot   Depression 02/13/2017   History of    Headache 04/09/2017   History of kidney stones    History of neurogenic bowel     History of rotator cuff tear    Right   History of ventral hernia    Hyperglycemia 05/18/2015   Hyperlipidemia    Hypertension    No medications at this time   Incomplete quadriplegia at C5-6 level (HCC) 07/24/2008   Qualifier: Diagnosis of  By: Georgian ROSALEA CHARM Lamar Requires bowel regimen including enemas and indwelling foley cath Has had tendency towards pressure sores in past. Had a tear with a transfer in Hospital in past for surgery and developed a sore at that time.    Low blood pressure reading    Occassionally   Medicare annual wellness visit, subsequent 11/02/2014   Follows with [facility] Declines colonoscopy  No h/o abnormal pap, declines further paps   MVP (mitral valve prolapse) 12/01/2012   Mild   Neurogenic bladder    OA (osteoarthritis)    Hip and shoulders, neck   Obesity 11/01/2016   OSA on CPAP 01/31/2016   Pain in joint, shoulder region 12/01/2012   Long history B/l Follows with Dr Gladis at [facility]   Postmenopausal bleeding 12/17/2012   Preventative health care 06/26/2013   Quadriplegia (HCC) 1981   Secondary to ruptured AVM C5-C6   Sacral pressure ulcer 12/01/2012   H/o Follows with Dr Todd Molt of Dermatology   Skin cancer 11/01/2016   2 BCC RLE excised by Dr Rolan Molt 2018 1SCC LLE excised by Dr Rolan Molt 2018   Vaginitis and vulvovaginitis 12/17/2012   Wears glasses  Past Surgical History:  Procedure Laterality Date   ANAL TAG REMOVAL  2014   BASAL CELL CARCINOMA EXCISION  2016   Lip   BASAL CELL CARCINOMA EXCISION  2018   right leg x2   BASAL CELL CARCINOMA EXCISION Left 2018   leg, chest   BASAL CELL CARCINOMA EXCISION Right 2019   leg   BLADDER STONE REMOVAL  1986/87   COLONOSCOPY  02/22/2016   COLOSTOMY  02/22/2016   CYSTOSCOPY WITH BIOPSY N/A 06/21/2018   Procedure: CYSTOSCOPY WITH BIOPSY FULGURATION;  Surgeon: Watt Rush, MD;  Location: [facility];  Service: Urology;  Laterality: N/A;   CYSTOSCOPY WITH LITHOLAPAXY  N/A 06/21/2018   Procedure: CYSTOSCOPY WITH LITHOLAPAXY;  Surgeon: Watt Rush, MD;  Location: [facility];  Service: Urology;  Laterality: N/A;   DORSAL CHEILECTOMY-HALLUX RIGIDUS Bilateral 2011   HERNIA REPAIR  2012   VENTRAL umbilical hernia repair   LAMINECTOMY  10/23/1979   AVM   LAPAROSCOPIC SIGMOID COLECTOMY  02/22/2016   ROTATOR CUFF REPAIR  1996, 1997   left shoulder   ROTATOR CUFF REPAIR Right 2001   SHOULDER ARTHROSCOPY  2007   right   SQUAMOUS CELL CARCINOMA EXCISION Left 2018   leg   Toe Nails Removal Bilateral 1981   both great toes   TOTAL SHOULDER REPLACEMENT  2001   left   TRANSFER TENDON HAND  1982, 1983   to right hand X3   VENTRAL HERNIA REPAIR  08/17/2010   Postop ileus (Dr. Tanda)    Family History  Problem Relation Age of Onset   Cancer Mother        breast & melanoma   Hypertension Mother    Heart disease Father        CAD, MI, AFib   Cancer Sister        melanoma   Dementia Sister        Alzheimer's   Cancer Maternal Aunt        breast   Cancer Paternal Aunt        breast   Cancer Paternal Aunt        breast   Cancer Paternal Uncle        breast   Cancer Maternal Grandfather        melanoma   Stroke Paternal Grandmother    Heart disease Paternal Grandfather     Social History   Socioeconomic History   Marital status: Single    Spouse name: Not on file   Number of children: Not on file   Years of education: Not on file   Highest education level: Bachelor's degree (e.g., BA, AB, BS)  Occupational History   Occupation: Disabled    Comment: editor, commissioning  Tobacco Use   Smoking status: Never    Passive exposure: Never   Smokeless tobacco: Never  Vaping Use   Vaping status: Never Used  Substance and Sexual Activity   Alcohol use: No   Drug use: No   Sexual activity: Not on file    Comment: lives with sister and mother, no major dietary restrictions  Other Topics Concern   Not on file  Social History Narrative    Sister is primary caregiver   Social Drivers of Health   Financial Resource Strain: Low Risk  (04/15/2024)   Overall Financial Resource Strain (CARDIA)    Difficulty of Paying Living Expenses: Not very hard  Food Insecurity: No Food Insecurity (04/15/2024)   Hunger Vital Sign  Worried About Programme Researcher, Broadcasting/film/video in the Last Year: Never true    Ran Out of Food in the Last Year: Never true  Transportation Needs: Unmet Transportation Needs (04/15/2024)   PRAPARE - Administrator, Civil Service (Medical): No    Lack of Transportation (Non-Medical): Yes  Physical Activity: Unknown (04/15/2024)   Exercise Vital Sign    Days of Exercise per Week: Patient declined    Minutes of Exercise per Session: Not on file  Recent Concern: Physical Activity - Insufficiently Active (01/30/2024)   Exercise Vital Sign    Days of Exercise per Week: 6 days    Minutes of Exercise per Session: 20 min  Stress: No Stress Concern Present (04/15/2024)   [employer] of Occupational Health - Occupational Stress Questionnaire    Feeling of Stress: Only a little  Recent Concern: Stress - Stress Concern Present (01/30/2024)   [employer] of Occupational Health - Occupational Stress Questionnaire    Feeling of Stress: To some extent  Social Connections: Moderately Integrated (04/15/2024)   Social Connection and Isolation Panel    Frequency of Communication with Friends and Family: More than three times a week    Frequency of Social Gatherings with Friends and Family: More than three times a week    Attends Religious Services: More than 4 times per year    Active Member of [employer] or Organizations: Yes    Attends Banker Meetings: More than 4 times per year    Marital Status: Never married  Recent Concern: Social Connections - Moderately Isolated (01/30/2024)   Social Connection and Isolation Panel    Frequency of Communication with Friends and Family: Three times a week    Frequency of  Social Gatherings with Friends and Family: More than three times a week    Attends Religious Services: More than 4 times per year    Active Member of [employer] or Organizations: No    Attends Engineer, Structural: Not on file    Marital Status: Never married  Intimate Partner Violence: Not At Risk (07/13/2023)   Humiliation, Afraid, Rape, and Kick questionnaire    Fear of Current or Ex-Partner: No    Emotionally Abused: No    Physically Abused: No    Sexually Abused: No    Outpatient Medications Prior to Visit  Medication Sig Dispense Refill   baclofen  (LIORESAL ) 20 MG tablet TAKE 1 TABLET BY MOUTH TWICE DAILY 180 tablet 0   cefdinir  (OMNICEF ) 300 MG capsule Take 1 capsule (300 mg total) by mouth 2 (two) times daily. 10 capsule 0   Cholecalciferol (VITAMIN D3) 1000 units CAPS Take 1 capsule by mouth daily.     clobetasol (TEMOVATE) 0.05 % external solution Apply 1 application topically every Monday, Wednesday, and Friday. Use on scalp 3 times per week     docusate sodium  (COLACE) 100 MG capsule Take 1 capsule (100 mg total) by mouth daily. 90 capsule 2   ELDERBERRY PO Take 2 each by mouth daily.     FIBER PO Take 2 capsules by mouth daily.     gabapentin  (NEURONTIN ) 100 MG capsule Take 1 capsule (100 mg total) by mouth at bedtime. 30 capsule 3   hydrochlorothiazide  (MICROZIDE ) 12.5 MG capsule Take 1 capsule (12.5 mg total) by mouth daily. 90 capsule 1   lisinopril  (ZESTRIL ) 5 MG tablet TAKE 1 TABLET BY MOUTH DAILY 90 tablet 1   Multiple Vitamins-Calcium  (ONE-A-DAY WOMENS FORMULA PO) Take 1 tablet by mouth  daily.     nitrofurantoin , macrocrystal-monohydrate, (MACROBID ) 100 MG capsule Take 1 capsule (100 mg total) by mouth 2 (two) times daily. 10 capsule 0   oxybutynin  (DITROPAN -XL) 10 MG 24 hr tablet Take 10 mg by mouth 2 (two) times daily.     Probiotic Product (DIGESTIVE ADVANTAGE) CAPS Take 1 tablet by mouth daily.     rosuvastatin  (CRESTOR ) 5 MG tablet Take 1 tablet (5 mg total)  by mouth daily. 90 tablet 3   Semaglutide ,0.25 or 0.5MG /DOS, (OZEMPIC , 0.25 OR 0.5 MG/DOSE,) 2 MG/3ML SOPN Inject 0.5 mg into the skin once a week. 3 mL 3   No facility-administered medications prior to visit.    Allergies  Allergen Reactions   Adhesive [Tape] Other (See Comments)    Skin irritation Does not tolerate [employer], tolerates Hollister    ROS See HPI    Objective:    Physical Exam  General: Patient appears well-nourished and in no acute distress. Alert and oriented to person and place. Engaged with provider throughout visit  HEENT: Head atraumatic and normocephalic. No facial asymmetry.. Pupils appear equal and reactive.  Respiratory: Breathing unlabored. No cough, wheezing, or respiratory distress observed. Respiratory rate appears normal. Neurological: Patient alert and oriented. Speech coherent. No facial droop, slurred speech, or focal deficits observed. Psychiatric: Mood and affect assessed via video. Patient cooperative. Thought process logical/coherent  There were no vitals taken for this visit. Wt Readings from Last 3 Encounters:  02/08/24 194 lb 8 oz (88.2 kg)  01/31/24 194 lb 8 oz (88.2 kg)  01/01/24 220 lb 14.4 oz (100.2 kg)       Assessment & Plan:   Problem List Items Addressed This Visit     Incomplete quadriplegia at C5-6 level (HCC)   Indwelling Foley catheter present   Neurogenic bladder - Primary   Chronic neurogenic bladder managed with an indwelling Foley catheter. The patient follows with [facility] (Dr. Brunetta) but has had difficulty reaching their office recently due to new electronic health record transitions. Our team has contacted [facility] to discuss home health orders for ongoing Foley catheter management. At this time, we will defer to urology for ordering and overseeing these services. [facility] has confirmed they will reach out to Ms. Pontarelli to initiate the necessary orders. If there are any delays or communication  issues, please contact our clinic so we can assist as needed.    I am having Jacklynn A. Sosinski maintain her Multiple Vitamins-Calcium  (ONE-A-DAY WOMENS FORMULA PO), clobetasol, Digestive Advantage, FIBER PO, ELDERBERRY PO, docusate sodium , rosuvastatin , lisinopril , baclofen , Vitamin D3, oxybutynin , gabapentin , Ozempic  (0.25 or 0.5 MG/DOSE), cefdinir , nitrofurantoin  (macrocrystal-monohydrate), and hydrochlorothiazide .  No orders of the defined types were placed in this encounter.   I discussed the assessment and treatment plan with the patient. The patient was provided an opportunity to ask questions and all were answered. The patient agreed with the plan and demonstrated an understanding of the instructions.   The patient was advised to [name] back or seek an in-person evaluation if the symptoms worsen or if the condition fails to improve as anticipated.  I provided 17 minutes of face-to-face time during this encounter.   Harlene LITTIE Jolly, NP [facility] at [facility] [phone] (phone) [phone] (fax)  [facility]

## 2024-04-22 ENCOUNTER — Other Ambulatory Visit: Payer: Self-pay | Admitting: Family Medicine

## 2024-04-26 DIAGNOSIS — E785 Hyperlipidemia, unspecified: Secondary | ICD-10-CM | POA: Diagnosis not present

## 2024-04-26 DIAGNOSIS — M1990 Unspecified osteoarthritis, unspecified site: Secondary | ICD-10-CM | POA: Diagnosis not present

## 2024-04-26 DIAGNOSIS — E669 Obesity, unspecified: Secondary | ICD-10-CM | POA: Diagnosis not present

## 2024-04-26 DIAGNOSIS — D649 Anemia, unspecified: Secondary | ICD-10-CM | POA: Diagnosis not present

## 2024-04-26 DIAGNOSIS — G8254 Quadriplegia, C5-C7 incomplete: Secondary | ICD-10-CM | POA: Diagnosis not present

## 2024-04-26 DIAGNOSIS — J209 Acute bronchitis, unspecified: Secondary | ICD-10-CM | POA: Diagnosis not present

## 2024-04-26 DIAGNOSIS — I10 Essential (primary) hypertension: Secondary | ICD-10-CM | POA: Diagnosis not present

## 2024-04-26 DIAGNOSIS — M25519 Pain in unspecified shoulder: Secondary | ICD-10-CM | POA: Diagnosis not present

## 2024-04-30 DIAGNOSIS — K644 Residual hemorrhoidal skin tags: Secondary | ICD-10-CM | POA: Diagnosis not present

## 2024-05-01 DIAGNOSIS — N310 Uninhibited neuropathic bladder, not elsewhere classified: Secondary | ICD-10-CM | POA: Diagnosis not present

## 2024-05-01 DIAGNOSIS — N3020 Other chronic cystitis without hematuria: Secondary | ICD-10-CM | POA: Diagnosis not present

## 2024-05-01 DIAGNOSIS — N3941 Urge incontinence: Secondary | ICD-10-CM | POA: Diagnosis not present

## 2024-05-07 DIAGNOSIS — R32 Unspecified urinary incontinence: Secondary | ICD-10-CM | POA: Diagnosis not present

## 2024-05-07 DIAGNOSIS — N390 Urinary tract infection, site not specified: Secondary | ICD-10-CM | POA: Diagnosis not present

## 2024-05-07 DIAGNOSIS — G8250 Quadriplegia, unspecified: Secondary | ICD-10-CM | POA: Diagnosis not present

## 2024-05-22 ENCOUNTER — Telehealth: Payer: Self-pay | Admitting: Family Medicine

## 2024-05-22 ENCOUNTER — Other Ambulatory Visit: Payer: Self-pay | Admitting: Family

## 2024-05-22 MED ORDER — FLUCONAZOLE 150 MG PO TABS: 150.0000 mg | ORAL_TABLET | ORAL | 0 refills | Status: DC

## 2024-05-22 NOTE — Telephone Encounter (Signed)
 Copied from CRM #[id number]. Topic: Clinical - Refused Triage >> May 22, 2024  8:45 AM [name] wrote: Patient/caller voiced complaints of patient was started on an antibiotic for a UTI by her urologist and it was a 30 day supply. Patient stated she is now having vaginal redness with burning and discomfort, also has developed yeast infection and still has 11 days left of antibiotic. Declined transfer to triage. Please call patient with next steps or if another medication can get called in to treat. Patient has previously taken Diflucan  and questioning is she can get that one re-prescribed.

## 2024-05-22 NOTE — Telephone Encounter (Signed)
 Patient was advised and verbalized understanding.

## 2024-05-22 NOTE — Telephone Encounter (Signed)
 Spoke with pt and she wanted me to call contact Dr. Watt office to see if they could give her a call. Called Dr. Watt office and the nurse stated that she will reach out to the patient to help. Pt was advised that nurse going to contact her and while on the phone the nurse had called pt. Pt wanted to let Dr. Domenica not to worry about anything since the office returned her call.

## 2024-05-25 ENCOUNTER — Other Ambulatory Visit: Payer: Self-pay | Admitting: Student

## 2024-05-25 DIAGNOSIS — M25552 Pain in left hip: Secondary | ICD-10-CM

## 2024-06-06 ENCOUNTER — Other Ambulatory Visit: Payer: Self-pay | Admitting: Family Medicine

## 2024-06-17 ENCOUNTER — Encounter: Payer: Self-pay | Admitting: Physical Medicine and Rehabilitation

## 2024-06-17 ENCOUNTER — Encounter: Attending: Physical Medicine and Rehabilitation | Admitting: Physical Medicine and Rehabilitation

## 2024-06-17 VITALS — BP 152/87 | HR 60 | Ht 71.5 in

## 2024-06-17 DIAGNOSIS — Z933 Colostomy status: Secondary | ICD-10-CM | POA: Insufficient documentation

## 2024-06-17 DIAGNOSIS — M25552 Pain in left hip: Secondary | ICD-10-CM | POA: Insufficient documentation

## 2024-06-17 DIAGNOSIS — G8250 Quadriplegia, unspecified: Secondary | ICD-10-CM | POA: Insufficient documentation

## 2024-06-17 DIAGNOSIS — N319 Neuromuscular dysfunction of bladder, unspecified: Secondary | ICD-10-CM | POA: Insufficient documentation

## 2024-06-17 DIAGNOSIS — M62838 Other muscle spasm: Secondary | ICD-10-CM | POA: Insufficient documentation

## 2024-06-17 DIAGNOSIS — Z978 Presence of other specified devices: Secondary | ICD-10-CM | POA: Insufficient documentation

## 2024-06-17 MED ORDER — GABAPENTIN 100 MG PO CAPS: 100.0000 mg | ORAL_CAPSULE | Freq: Two times a day (BID) | ORAL | 1 refills | Status: AC

## 2024-06-17 MED ORDER — BACLOFEN 20 MG PO TABS: 20.0000 mg | ORAL_TABLET | Freq: Three times a day (TID) | ORAL | 1 refills | Status: AC

## 2024-06-17 NOTE — Patient Instructions (Signed)
 Pt is a 74 yr old female with hx of DM, Morbid obesity, HTN, HLD, MVP; who has been in w/c since 74 years old- 14-  due to incomplete quadriplegia-- C5/6 incomplete- due to  AVM-  with hx of tendon transfer (for pinch)  R side- , with neurogenic bowel and bladder, and severe spasticity. Also has hx of TSA- (L 2000 regular total shoulder and R Total reserve 2020)  B/L and multiple shoulder surgeries.   Here for SCI evaluation.     Differential diagnosis as to why increased spasticity:  Possible compression in spine that's causing increased spasticity. So maybe arthritis vs disc herniation.  Instrumentation of bladder-   Or reduction in estrogen long term can cause increased in UTI's- which can then cause increase in spasticity.   2. Move pt back to [facility]-  I think she needs to go back due to this cushion due to sitting on bubble. Per [place], not mapping well- had hot spots on [employer]. [employer]- might be appropriate.  - spoke directly to [place]-    3.   Spasticity currently taking baclofen  20 mg 2x/day-    We discussed  3x/day dosing to help tone/tightness of legs-  - a. Increase baclofen  to 20 mg 3x/day- for 1-2 weeks- see how that goes.  B. If that goes well, but still needs something else, increase gabapentin  to 100 mg 2x/day. - If things haven't calmed down; call me if want to increase more.    4.   Doesn't want to get Cervical/thoracic MRI at this time.   5. Spasticity  gets worse if has infection, or inflammation.    6. F/U in 3 months double- SCI  7. If they cannot get Communication fixed, need to ask Dr Watt another Urologist. And see if needs Antibiotics for Cystoscopy?

## 2024-06-17 NOTE — Progress Notes (Signed)
 "  Subjective:    Patient ID: [name], female    DOB: 12-Aug-1950, 74 y.o.   MRN: [id number]  HPI Pt is a 74 yr old female with hx of DM, Morbid obesity, HTN, HLD, MVP; who has been in w/c since 74 years old-  due to incomplete quadriplegia-  AVM- , with neurogenic bowel and bladder, and severe spasticity,  Here for SCI evaluation.   Had surgery for AVM,- by decompressing Cord.    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  Waited 13 hours for surgery since thought was psychological.  Did CT myelogram- and found compression.    Received new w/c at end of March 2025.   Started having more spasticity LLE drawing up- more-  July- had  red place on L heel- red and swollen- not pressure-   Given Oral ABX-  CT spine and xrays fine  When lays down at night-   anything that touches legs, makes legs move-/spasm.   Been on Baclofen  for ~44 years. Never had bad spasms.  Keeping her awake-   Have now settled down.- less now, but haven't gone away   Whenever gets catheter changed, sets spasms off.  Doesn't know what's changed, but feels like something has.    More leakage after UTI- never really cleared up.    [facility]- Dr. Watt- but cannot never get ahold of them. Cannot get message through to them either. Kept on PO ABX for 30 days-  couple of weeks ago.   Got BAD yeast infection.  PCP gave her Diflucan  for yeast infection.  Been going to him Dr Watt since early 2000's.  Last cystoscopy was 09/06/23.  Did urodynamics 09/06/23.  Tends to grow bladder stones. Because colonized with Proteus.    Normally takes baclofen  20 mg 2x/day-  Oxybutynin  10 mg Xl daily.    Tried gabapentin  100 mg at bedtime.    Was  having a lot of biofilm in catheter- had a UTI- never had UTI's or pressure ulcers before-  stringy stuff in foley.   First one was 10/05/23-  and 2nd one  December 2025.    Wears compression socks- legs go down at night- and skinny  in AM- swelling is mild during day.     W/C- [employer]-  [employer]-  3/25- Permobil F3-  Last w/c 74 years old ROHO- combined cushion-  (used to use [employer]) Trouble with losing air from [facility] and has to put air in it a lot!   When drives, has to cinch the seatbelt since doesn't have as much balance on it.   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  yes use a wheelchair needs help with transfers  Function not employed: date last employed 10/22/1989 disabled: date disabled 10/23/1979 I need assistance with the following:  dressing, bathing, meal prep, household duties, and shopping  Neuro/Psych bladder control problems bowel control problems weakness numbness tingling spasms  Prior Studies Any changes since last visit?  no  Physicians involved in your care Any changes since last visit?  no   Family History  Problem Relation Age of Onset   Cancer Mother        breast & melanoma   Hypertension Mother    Heart disease Father        CAD, MI, AFib   Cancer Sister        melanoma   Dementia Sister        Alzheimer's   Cancer Maternal Aunt        breast   Cancer Paternal Aunt        breast   Cancer Paternal Aunt        breast   Cancer Paternal Uncle        breast   Cancer Maternal Grandfather        melanoma   Stroke Paternal Grandmother    Heart disease Paternal Grandfather    Social History   Socioeconomic History   Marital status: Single    Spouse name: Not on file    Number of children: Not on file   Years of education: Not on file   Highest education level: Bachelor's degree (e.g., BA, AB, BS)  Occupational History   Occupation: Disabled    Comment: editor, commissioning  Tobacco Use   Smoking status: Never    Passive exposure: Never   Smokeless tobacco: Never  Vaping Use   Vaping status: Never Used  Substance and Sexual Activity   Alcohol use: No   Drug use: No   Sexual activity: Not on file    Comment: lives with sister and mother, no major dietary restrictions  Other Topics Concern   Not on file  Social History Narrative   Sister is primary caregiver   Social Drivers of Health   Tobacco Use: Low Risk (06/17/2024)   Patient History    Smoking Tobacco Use: Never    Smokeless Tobacco Use: Never    Passive Exposure: Never  Financial Resource Strain: Low Risk (04/15/2024)   Overall Financial Resource Strain (CARDIA)    Difficulty of Paying Living Expenses: Not very hard  Food Insecurity: Low Risk (04/30/2024)   Received from [facility]   Epic    Within the past 12 months, you worried that your food would run out before you got money to buy more: Never true    Within the past 12 months, the food you bought just didn't last and you didn't have money to get more. : Never true  Transportation Needs: No Transportation Needs (04/30/2024)   Received from [facility]    In the past 12 months, has lack of reliable transportation kept you from medical appointments, meetings, work or from getting things needed for daily living? : No  Recent Concern: Transportation Needs - Unmet Transportation Needs (04/15/2024)   Epic    Lack of Transportation (Medical): No    Lack of Transportation (Non-Medical): Yes  Physical Activity: Unknown (04/15/2024)   Exercise Vital Sign    Days of Exercise per Week: Patient declined    Minutes of Exercise per Session: Not on file  Recent Concern: Physical Activity - Insufficiently Active (01/30/2024)    Exercise Vital Sign    Days of Exercise per Week: 6 days    Minutes of Exercise per Session: 20 min  Stress: No Stress Concern Present (04/15/2024)   [employer] of Occupational [employer] - Occupational Stress Questionnaire  Feeling of Stress: Only a little  Recent Concern: Stress - Stress Concern Present (01/30/2024)   [employer] of Occupational [employer] - Occupational Stress Questionnaire    Feeling of Stress: To some extent  Social Connections: Moderately Integrated (04/15/2024)   Social Connection and Isolation Panel    Frequency of Communication with Friends and Family: More than three times a week    Frequency of Social Gatherings with Friends and Family: More than three times a week    Attends Religious Services: More than 4 times per year    Active Member of [employer] or Organizations: Yes    Attends Banker Meetings: More than 4 times per year    Marital Status: Never married  Recent Concern: Social Connections - Moderately Isolated (01/30/2024)   Social Connection and Isolation Panel    Frequency of Communication with Friends and Family: Three times a week    Frequency of Social Gatherings with Friends and Family: More than three times a week    Attends Religious Services: More than 4 times per year    Active Member of Clubs or Organizations: No    Attends Banker Meetings: Not on file    Marital Status: Never married  Depression (PHQ2-9): Low Risk (06/17/2024)   Depression (PHQ2-9)    PHQ-2 Score: 0  Alcohol Screen: Low Risk (07/08/2023)   Alcohol Screen    Last Alcohol Screening Score (AUDIT): 0  Housing: Low Risk (04/30/2024)   Received from [facility]   Epic    What is your living situation today?: I have a steady place to live    Think about the place you live. Do you have problems with any of the following? Choose all that apply:: Not on file  Utilities: Low Risk (04/30/2024)   Received from [facility]   Utilities    In the past 12  months has the electric, gas, oil, or water company threatened to shut off services in your home? : No  Health Literacy: Adequate Health Literacy (07/13/2023)   B1300 Health Literacy    Frequency of need for help with medical instructions: Never   Past Surgical History:  Procedure Laterality Date   ANAL TAG REMOVAL  2014   BASAL CELL CARCINOMA EXCISION  2016   Lip   BASAL CELL CARCINOMA EXCISION  2018   right leg x2   BASAL CELL CARCINOMA EXCISION Left 2018   leg, chest   BASAL CELL CARCINOMA EXCISION Right 2019   leg   BLADDER STONE REMOVAL  1986/87   COLONOSCOPY  02/22/2016   COLOSTOMY  02/22/2016   CYSTOSCOPY WITH BIOPSY N/A 06/21/2018   Procedure: CYSTOSCOPY WITH BIOPSY FULGURATION;  Surgeon: Watt Rush, MD;  [facility];  Service: Urology;  Laterality: N/A;   CYSTOSCOPY WITH LITHOLAPAXY N/A 06/21/2018   Procedure: CYSTOSCOPY WITH LITHOLAPAXY;  Surgeon: Watt Rush, MD;  Location: [facility];  Service: Urology;  Laterality: N/A;   DORSAL CHEILECTOMY-HALLUX RIGIDUS Bilateral 2011   HERNIA REPAIR  2012   VENTRAL umbilical hernia repair   LAMINECTOMY  10/23/1979   AVM   LAPAROSCOPIC SIGMOID COLECTOMY  02/22/2016   ROTATOR CUFF REPAIR  1996, 1997   left shoulder   ROTATOR CUFF REPAIR Right 2001   SHOULDER ARTHROSCOPY  2007   right   SQUAMOUS CELL CARCINOMA EXCISION Left 2018   leg   Toe Nails Removal Bilateral 1981   both great toes   TOTAL SHOULDER REPLACEMENT  2001  left   TRANSFER TENDON HAND  1982, 1983   to right hand X3   VENTRAL HERNIA REPAIR  08/17/2010   Postop ileus (Dr. Tanda)   Past Medical History:  Diagnosis Date   Acute bronchitis 05/16/2015   Anemia    after shoulder replacement   Autonomic dysreflexia    AVM (arteriovenous malformation) spine 1981   Bowel dysfunction    constipation and fecal retention   Cerumen impaction 10/11/2015   Complication of anesthesia    low temperature after general anesthesia for  colostomy   Constipation 01/25/2016   Contusion 05/18/2014   Of foot   Depression 02/13/2017   History of    Headache 04/09/2017   History of kidney stones    History of neurogenic bowel    History of rotator cuff tear    Right   History of ventral hernia    Hyperglycemia 05/18/2015   Hyperlipidemia    Hypertension    No medications at this time   Incomplete quadriplegia at C5-6 level (HCC) 07/24/2008   Qualifier: Diagnosis of  By: Georgian ROSALEA CHARM Lamar Requires bowel regimen including enemas and indwelling foley cath Has had tendency towards pressure sores in past. Had a tear with a transfer in Hospital in past for surgery and developed a sore at that time.    Low blood pressure reading    Occassionally   Medicare annual wellness visit, subsequent 11/02/2014   Follows with [facility] Declines colonoscopy  No h/o abnormal pap, declines further paps   MVP (mitral valve prolapse) 12/01/2012   Mild   Neurogenic bladder    OA (osteoarthritis)    Hip and shoulders, neck   Obesity 11/01/2016   OSA on CPAP 01/31/2016   Pain in joint, shoulder region 12/01/2012   Long history B/l [facility]   Postmenopausal bleeding 12/17/2012   Preventative health care 06/26/2013   Quadriplegia (HCC) 1981   Secondary to ruptured AVM C5-C6   Sacral pressure ulcer 12/01/2012   H/o Follows with Dr Todd Molt of Dermatology   Skin cancer 11/01/2016   2 BCC RLE excised by Dr Rolan Molt 2018 1SCC LLE excised by Dr Rolan Molt 2018   Vaginitis and vulvovaginitis 12/17/2012   Wears glasses    BP (!) 152/87   Pulse 60   Ht 5' 11.5 (1.816 m)   SpO2 95%   BMI 26.75 kg/m   Opioid Risk Score:   Fall Risk Score:  `1  Depression screen PHQ 2/9     06/17/2024    1:12 PM 01/31/2024    1:11 PM 07/13/2023    2:02 PM 04/25/2022    1:53 PM 01/06/2022   11:37 AM 04/21/2021    1:08 PM 06/21/2016    4:11 PM  Depression screen PHQ 2/9  Decreased Interest 0 0 0 0 0 0 0  Down, Depressed,  Hopeless 0 0 0 0  0 0  PHQ - 2 Score 0 0 0 0 0 0 0  Altered sleeping 0 0   0    Tired, decreased energy 0 0   0    Change in appetite 0 0   0    Feeling bad or failure about yourself  0 0   0    Trouble concentrating 0 0   0    Moving slowly or fidgety/restless 0 0   0    Suicidal thoughts 0 0   0    PHQ-9  Score 0 0    0     Difficult doing work/chores     Not difficult at all       Data saved with a previous flowsheet row definition     Review of Systems  Musculoskeletal:  Positive for gait problem.       Spasms  Neurological:  Positive for numbness.       Tingling  All other systems reviewed and are negative.      Objective:   Physical Exam  Awake, alert, appropriate, sitting up in power w/c; R joystick, NAD  MSK:  RUE- Biceps, triceps and WE 5-/5; grip 2/5; and FA 0-1/5 LUE- Biceps, triceps, WE 5-/5; grip 2+/5 and FA 2-/5 LEs- 0/5 except maybe 1/5 in PF  Neuro: MAS of 2 in RLE- and 2-3 in LLE Few beats clonus in LEs- and  Hoffman's mild in Ue's- B/L Tone noted in fingers esp- in Ue's- not as much otherwise- MAS of 1 in Ue's otherwise- fingers MAS of 1+ to 2     Assessment & Plan:   Pt is a 74 yr old female with hx of DM, Morbid obesity, HTN, HLD, MVP; who has been in w/c since 74 years old- 46-  due to incomplete quadriplegia-- C5/6 incomplete- due to  AVM-  with hx of tendon transfer (for pinch)  R side- , with neurogenic bowel and bladder, and severe spasticity. Also has hx of TSA- (L 2000 regular total shoulder and R Total reserve 2020)  B/L and multiple shoulder surgeries.   Here for SCI evaluation.     Differential diagnosis as to why increased spasticity:  Possible compression in spine that's causing increased spasticity. So maybe arthritis vs disc herniation.  Instrumentation of bladder-   Or reduction in estrogen long term can cause increased in UTI's- which can then cause increase in spasticity.   2. Move pt back to [facility]-  I think she  needs to go back due to this cushion due to sitting on bubble. Per [place], not mapping well- had hot spots on [employer]. [employer]- might be appropriate.  - spoke directly to [place]rum-    3.   Spasticity currently taking baclofen  20 mg 2x/day-    We discussed  3x/day dosing to help tone/tightness of legs-  - a. Increase baclofen  to 20 mg 3x/day- for 1-2 weeks- see how that goes.  B. If that goes well, but still needs something else, increase gabapentin  to 100 mg 2x/day. - If things haven't calmed down; call me if want to increase more.    4.   Doesn't want to get Cervical/thoracic MRI at this time.   5. Spasticity  gets worse if has infection, or inflammation.    6. F/U in 3 months double- SCI  7. If they cannot get Communication fixed, need to ask Dr Watt another Urologist. And see if needs Antibiotics for Cystoscopy?  I spent a total of  65  minutes on total care today- >50% coordination of care- due to  prolonged discussion about spasticity- and how it relates- and call [facility]- and prolonged discussion about work up.        "

## 2024-07-01 ENCOUNTER — Ambulatory Visit: Admitting: Family Medicine

## 2024-07-05 ENCOUNTER — Ambulatory Visit: Admitting: Student

## 2024-07-10 ENCOUNTER — Other Ambulatory Visit: Payer: Self-pay | Admitting: Family Medicine

## 2024-07-10 DIAGNOSIS — I10 Essential (primary) hypertension: Secondary | ICD-10-CM

## 2024-07-17 ENCOUNTER — Ambulatory Visit: Admitting: Student

## 2024-07-17 ENCOUNTER — Encounter: Payer: Self-pay | Admitting: Student

## 2024-07-17 VITALS — BP 135/69 | HR 61 | Temp 97.9°F | Resp 14 | Ht 71.5 in | Wt 192.0 lb

## 2024-07-17 DIAGNOSIS — N319 Neuromuscular dysfunction of bladder, unspecified: Secondary | ICD-10-CM

## 2024-07-17 DIAGNOSIS — G4733 Obstructive sleep apnea (adult) (pediatric): Secondary | ICD-10-CM

## 2024-07-17 DIAGNOSIS — G8254 Quadriplegia, C5-C7 incomplete: Secondary | ICD-10-CM

## 2024-07-17 DIAGNOSIS — M62838 Other muscle spasm: Secondary | ICD-10-CM

## 2024-07-17 DIAGNOSIS — E782 Mixed hyperlipidemia: Secondary | ICD-10-CM

## 2024-07-17 DIAGNOSIS — Z1321 Encounter for screening for nutritional disorder: Secondary | ICD-10-CM

## 2024-07-17 DIAGNOSIS — I10 Essential (primary) hypertension: Secondary | ICD-10-CM

## 2024-07-17 DIAGNOSIS — E119 Type 2 diabetes mellitus without complications: Secondary | ICD-10-CM

## 2024-07-17 NOTE — Assessment & Plan Note (Signed)
 Well controlled, no changes to meds. Encouraged heart healthy diet such as the DASH diet and exercise as tolerated.

## 2024-07-17 NOTE — Assessment & Plan Note (Addendum)
 Patient continues to drive and uses a power chair for ADLs. She continues to live at home with her sister and manages her ADLs with the help of aides and her sister.

## 2024-07-17 NOTE — Assessment & Plan Note (Signed)
 Tolerating statin. Encourage heart healthy diet such as MIND or DASH diet, increase exercise, avoid trans fats, simple carbohydrates and processed foods, consider a krill or fish or flaxseed oil cap daily.

## 2024-07-17 NOTE — Assessment & Plan Note (Signed)
 Managed with Mounjaro On statin and Ace-i hgba1c acceptable, minimize simple carbs. Increase exercise as tolerated. Continue current meds

## 2024-07-17 NOTE — Progress Notes (Signed)
 "  Subjective:     Patient ID: [name], female    DOB: 1950-12-05, 74 y.o.   MRN: [id number]  Chief Complaint  Patient presents with   Follow-up    Patient is here for a 4 month follow up    HPI    [name] is a 74 year old female presents for follow-up chronic conditions. 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 Brunetta  HTN Lisinopril  5 mg daily, HCTZ 12.5 mg daily  HLD-rosuvastatin  5 mg daily  Diabetes: - Medications: semaglutide  0.5 mg injection weekly - Compliant with medications - Denies symptoms of hypoglycemia, medication side effects    She has persistent muscle spasms despite gabapentin  twice daily and baclofen  three times daily. The spasms are less frequent when she is sitting but continue, especially at night and when dressing.  She has a prior basal cell carcinoma on her legs with a recent removal that left a scab. Since that time she has had new redness and pain in her right heel and daytime swelling of the right leg and foot that improves overnight. She had similar heel symptoms on the left in the past that improved  She has Hx of dependent edema and wears compression hose daily. She sleeps with her legs elevated, which reduces swelling by morning. She now has new pain in the back of her right leg, described as feeling like a turnip on it, beginning after the basal cell removal.  Patient denies fever, chills, SOB, CP, palpitations, dyspnea, edema, HA, vision changes, N/V/D, abdominal pain, rash, w  History of Present Illness              Health Maintenance Due  Topic Date Due    Influenza Vaccine  01/12/2024   COVID-19 Vaccine (4 - 2025-26 season) 02/12/2024   Medicare Annual Wellness (AWV)  07/12/2024   HEMOGLOBIN A1C  06/26/2024    Past Medical History:  Diagnosis Date   Acute bronchitis 05/16/2015   Anemia    after shoulder replacement   Autonomic dysreflexia    AVM (arteriovenous malformation) spine 1981   Bowel dysfunction    constipation and fecal retention   Cerumen impaction 10/11/2015   Complication of anesthesia    low temperature after general anesthesia for colostomy   Constipation 01/25/2016   Contusion 05/18/2014   Of foot   Depression 02/13/2017   History of    Headache 04/09/2017   History of kidney stones    History of neurogenic bowel    History of rotator cuff tear    Right   History of ventral hernia    Hyperglycemia 05/18/2015   Hyperlipidemia    Hypertension    No medications at this time   Incomplete quadriplegia at C5-6 level (HCC) 07/24/2008   Qualifier: Diagnosis of  By: Georgian ROSALEA CHARM Lamar Requires bowel regimen including enemas and indwelling foley cath Has had tendency towards pressure sores in past. Had a tear with a transfer in Hospital in past for surgery and developed a sore at that time.    Low blood pressure reading    Occassionally   Medicare annual wellness visit, subsequent 11/02/2014  Follows with [facility] Declines colonoscopy  No h/o abnormal pap, declines further paps   MVP (mitral valve prolapse) 12/01/2012   Mild   Neurogenic bladder    OA (osteoarthritis)    Hip and shoulders, neck   Obesity 11/01/2016   OSA on CPAP 01/31/2016   Pain in joint, shoulder region 12/01/2012   Long history B/l Follows with Dr Gladis [facility]   Postmenopausal bleeding 12/17/2012   Preventative health care 06/26/2013   Quadriplegia (HCC) 1981   Secondary to ruptured AVM C5-C6   Sacral pressure ulcer 12/01/2012   H/o Follows with Dr Todd Molt of Dermatology   Skin cancer 11/01/2016   2 BCC RLE excised by Dr Rolan Molt 2018 1SCC LLE excised by Dr Rolan Molt 2018   Vaginitis and vulvovaginitis 12/17/2012   Wears glasses     Past Surgical History:  Procedure Laterality Date   ANAL TAG REMOVAL  2014   BASAL CELL CARCINOMA EXCISION  2016   Lip   BASAL CELL CARCINOMA EXCISION  2018   right leg x2   BASAL CELL CARCINOMA EXCISION Left 2018   leg, chest   BASAL CELL CARCINOMA EXCISION Right 2019   leg   BLADDER STONE REMOVAL  1986/87   COLONOSCOPY  02/22/2016   COLOSTOMY  02/22/2016   CYSTOSCOPY WITH BIOPSY N/A 06/21/2018   Procedure: CYSTOSCOPY WITH BIOPSY FULGURATION;  Surgeon: Watt Rush, MD;  Location[facility];  Service: Urology;  Laterality: N/A;   CYSTOSCOPY WITH LITHOLAPAXY N/A 06/21/2018   Procedure: CYSTOSCOPY WITH LITHOLAPAXY;  Surgeon: Watt Rush, MD;  Location: [facility];  Service: Urology;  Laterality: N/A;   DORSAL CHEILECTOMY-HALLUX RIGIDUS Bilateral 2011   HERNIA REPAIR  2012   VENTRAL umbilical hernia repair   LAMINECTOMY  10/23/1979   AVM   LAPAROSCOPIC SIGMOID COLECTOMY  02/22/2016   ROTATOR CUFF REPAIR  1996, 1997   left shoulder   ROTATOR CUFF REPAIR Right 2001   SHOULDER ARTHROSCOPY  2007   right   SQUAMOUS CELL CARCINOMA EXCISION Left 2018   leg   Toe Nails Removal Bilateral 1981   both great toes   TOTAL SHOULDER REPLACEMENT  2001   left   TRANSFER TENDON HAND  1982, 1983   to right hand X3   VENTRAL HERNIA REPAIR  08/17/2010   Postop ileus (Dr. Tanda)    Family History  Problem Relation Age of Onset   Cancer Mother        breast & melanoma   Hypertension Mother    Heart disease Father        CAD, MI, AFib   Cancer Sister        melanoma   Dementia Sister        Alzheimer's   Cancer Maternal Aunt        breast   Cancer Paternal Aunt        breast   Cancer Paternal Aunt        breast   Cancer Paternal Uncle        breast   Cancer Maternal Grandfather        melanoma   Stroke Paternal Grandmother    Heart disease  Paternal Grandfather     Social History   Socioeconomic History   Marital status: Single    Spouse name: Not on file   Number of children: Not on file   Years of education: Not on file   Highest  education level: Bachelor's degree (e.g., BA, AB, BS)  Occupational History   Occupation: Disabled    Comment: editor, commissioning  Tobacco Use   Smoking status: Never    Passive exposure: Never   Smokeless tobacco: Never  Vaping Use   Vaping status: Never Used  Substance and Sexual Activity   Alcohol use: No   Drug use: No   Sexual activity: Not on file    Comment: lives with sister and mother, no major dietary restrictions  Other Topics Concern   Not on file  Social History Narrative   Sister is primary caregiver   Social Drivers of Health   Tobacco Use: Low Risk (07/17/2024)   Patient History    Smoking Tobacco Use: Never    Smokeless Tobacco Use: Never    Passive Exposure: Never  Financial Resource Strain: Low Risk (04/15/2024)   Overall Financial Resource Strain (CARDIA)    Difficulty of Paying Living Expenses: Not very hard  Food Insecurity: Low Risk (04/30/2024)   Received from [facility]   Epic    Within the past 12 months, you worried that your food would run out before you got money to buy more: Never true    Within the past 12 months, the food you bought just didn't last and you didn't have money to get more. : Never true  Transportation Needs: No Transportation Needs (04/30/2024)   Received from [facility]    In the past 12 months, has lack of reliable transportation kept you from medical appointments, meetings, work or from getting things needed for daily living? : No  Recent Concern: Transportation Needs - Unmet Transportation Needs (04/15/2024)   Epic    Lack of Transportation (Medical): No    Lack of Transportation (Non-Medical): Yes  Physical Activity: Unknown (04/15/2024)   Exercise Vital Sign    Days of Exercise per Week: Patient declined     Minutes of Exercise per Session: Not on file  Recent Concern: Physical Activity - Insufficiently Active (01/30/2024)   Exercise Vital Sign    Days of Exercise per Week: 6 days    Minutes of Exercise per Session: 20 min  Stress: No Stress Concern Present (04/15/2024)   [employer] of Occupational Health - Occupational Stress Questionnaire    Feeling of Stress: Only a little  Recent Concern: Stress - Stress Concern Present (01/30/2024)   [employer] of Occupational Health - Occupational Stress Questionnaire    Feeling of Stress: To some extent  Social Connections: Moderately Integrated (04/15/2024)   Social Connection and Isolation Panel    Frequency of Communication with Friends and Family: More than three times a week    Frequency of Social Gatherings with Friends and Family: More than three times a week    Attends Religious Services: More than 4 times per year    Active Member of [employer] or Organizations: Yes    Attends Banker Meetings: More than 4 times per year    Marital Status: Never married  Recent Concern: Social Connections - Moderately Isolated (01/30/2024)   Social Connection and Isolation Panel    Frequency of Communication with Friends and Family: Three times a week    Frequency of Social Gatherings with Friends and Family: More than three times a week    Attends Religious Services: More than 4 times per year    Active Member of [employer] or Organizations: No    Attends Banker Meetings: Not on file  Marital Status: Never married  Intimate Partner Violence: Not At Risk (07/13/2023)   Humiliation, Afraid, Rape, and Kick questionnaire    Fear of Current or Ex-Partner: No    Emotionally Abused: No    Physically Abused: No    Sexually Abused: No  Depression (PHQ2-9): Low Risk (07/17/2024)   Depression (PHQ2-9)    PHQ-2 Score: 0  Alcohol Screen: Low Risk (07/08/2023)   Alcohol Screen    Last Alcohol Screening Score (AUDIT): 0  Housing: Low Risk  (04/30/2024)   Received from [facility]   Epic    What is your living situation today?: I have a steady place to live    Think about the place you live. Do you have problems with any of the following? Choose all that apply:: Not on file  Utilities: Low Risk (04/30/2024)   Received from [facility]   Utilities    In the past 12 months has the electric, gas, oil, or water company threatened to shut off services in your home? : No  Health Literacy: Adequate Health Literacy (07/13/2023)   B1300 Health Literacy    Frequency of need for help with medical instructions: Never    Outpatient Medications Prior to Visit  Medication Sig Dispense Refill   baclofen  (LIORESAL ) 20 MG tablet Take 1 tablet (20 mg total) by mouth 3 (three) times daily. 270 tablet 1   Cholecalciferol (VITAMIN D3) 1000 units CAPS Take 1 capsule by mouth daily.     clobetasol (TEMOVATE) 0.05 % external solution Apply 1 application topically every Monday, Wednesday, and Friday. Use on scalp 3 times per week     docusate sodium  (COLACE) 100 MG capsule Take 1 capsule (100 mg total) by mouth daily. 90 capsule 2   ELDERBERRY PO Take 2 each by mouth daily.     FIBER PO Take 2 capsules by mouth daily.     gabapentin  (NEURONTIN ) 100 MG capsule Take 1 capsule (100 mg total) by mouth 2 (two) times daily. 180 capsule 1   GEMTESA 75 MG TABS Take 1 tablet by mouth daily.     hydrochlorothiazide  (MICROZIDE ) 12.5 MG capsule Take 1 capsule (12.5 mg total) by mouth daily. 90 capsule 1   lisinopril  (ZESTRIL ) 5 MG tablet TAKE 1 TABLET BY MOUTH DAILY 90 tablet 1   Multiple Vitamins-Calcium  (ONE-A-DAY WOMENS FORMULA PO) Take 1 tablet by mouth daily.     oxybutynin  (DITROPAN -XL) 10 MG 24 hr tablet Take 10 mg by mouth 2 (two) times daily.     Probiotic Product (DIGESTIVE ADVANTAGE) CAPS Take 1 tablet by mouth daily.     rosuvastatin  (CRESTOR ) 5 MG tablet Take 1 tablet (5 mg total) by mouth daily. 90 tablet 3   Semaglutide ,0.25 or 0.5MG /DOS,  (OZEMPIC , 0.25 OR 0.5 MG/DOSE,) 2 MG/3ML SOPN INJECT 0.5 MG INTO THE SKIN ONE TIME PER WEEK 3 mL 3   No facility-administered medications prior to visit.    Allergies  Allergen Reactions   Adhesive [Tape] Other (See Comments)    Skin irritation Does not tolerate [employer], tolerates Hollister    ROS   See HPI Objective:    Physical Exam  General: No acute distress. Awake and conversant.  Eyes: Normal conjunctiva, anicteric. Round symmetric pupils.  ENT: Hearing grossly intact. No nasal discharge.  Neck: Neck is supple. No masses or thyromegaly.  Respiratory: CTAB. Respirations are non-labored. No wheezing.  Skin: Warm. No rashes or ulcers.  CV: RRR. No murmur.  Abdominal:     General: Bowel sounds  are normal.     Palpations: Abdomen is soft.     Tenderness: There is no abdominal tenderness. There is no guarding.  MSK: No clubbing or cyanosis. In wheelchair.  +right heel- erythema, blanchable. No drainage or signs of infection Neuro:      Cranial Nerves: No cranial nerve deficit.     Sensory: Sensory deficit present.     Motor: Weakness present.     Coordination: Coordination abnormal.     Gait: Gait abnormal. +wheelchair    Deep Tendon Reflexes: Reflexes abnormal Psych: Alert and oriented. Cooperative, Appropriate mood and affect, Normal judgment.   BP 135/69 (BP Location: Right Arm, Cuff Size: Large)   Pulse 61   Temp 97.9 F (36.6 C) (Oral)   Resp 14   Ht 5' 11.5 (1.816 m)   Wt 192 lb (87.1 kg)   SpO2 100%   BMI 26.41 kg/m  Wt Readings from Last 3 Encounters:  07/17/24 192 lb (87.1 kg)  02/08/24 194 lb 8 oz (88.2 kg)  01/31/24 194 lb 8 oz (88.2 kg)       Assessment & Plan:   Problem List Items Addressed This Visit       Cardiovascular and Mediastinum   Essential hypertension - Primary   Well controlled, no changes to meds. Encouraged heart healthy diet such as the DASH diet and exercise as tolerated.         Relevant Orders   CBC with  Differential/Platelet   TSH     Respiratory   OSA (obstructive sleep apnea)   On CPAP. Follows with Pulmonary.         Endocrine   Diabetes mellitus without complication (HCC)   Managed with Mounjaro On statin and Ace-i hgba1c acceptable, minimize simple carbs. Increase exercise as tolerated. Continue current meds         Relevant Orders   HgB A1c   Comprehensive metabolic panel with GFR     Nervous and Auditory   Incomplete quadriplegia at C5-6 level [facility])   Patient continues to drive and uses a power chair for ADLs. She continues to live at home with her sister and manages her ADLs with the help of aides and her sister.         Other   Hyperlipidemia   Tolerating statin. Encourage heart healthy diet such as MIND or DASH diet, increase exercise, avoid trans fats, simple carbohydrates and processed foods, consider a krill or fish or flaxseed oil cap daily.        Relevant Orders   Lipid panel   Muscle spasticity   Follows with [facility]. Managed with baclofen  and gabapentin .        Neurogenic bladder   Indwelling catheter, managed by [facility]. Denies recent complications.       Other Visit Diagnoses       Encounter for vitamin deficiency screening       Relevant Orders   Vitamin D (25 hydroxy)      Pressure injury risk, right heel Redness of right heel due to pressure. No open wounds, swelling, or signs of infection. - Start floating heels at night -FU if persist or worsens  FU 6 months     I am having Tecia A. Bernick maintain her Multiple Vitamins-Calcium  (ONE-A-DAY WOMENS FORMULA PO), clobetasol, Digestive Advantage, FIBER PO, ELDERBERRY PO, docusate sodium , rosuvastatin , Vitamin D3, oxybutynin , hydrochlorothiazide , Ozempic  (0.25 or 0.5 MG/DOSE), Gemtesa, baclofen , gabapentin , and lisinopril .  No orders of the defined  types were placed in this encounter.  "

## 2024-07-17 NOTE — Assessment & Plan Note (Signed)
 Indwelling catheter, managed by [facility]. Denies recent complications.

## 2024-07-17 NOTE — Assessment & Plan Note (Signed)
 On CPAP. Follows with Pulmonary.

## 2024-07-17 NOTE — Assessment & Plan Note (Signed)
 Follows with [facility]. Managed with baclofen  and gabapentin .

## 2024-07-18 ENCOUNTER — Ambulatory Visit: Payer: Self-pay | Admitting: Student

## 2024-07-18 LAB — CBC WITH DIFFERENTIAL/PLATELET
Basophils Absolute: 0.1 10*3/uL (ref 0.0–0.1)
Basophils Relative: 0.7 % (ref 0.0–3.0)
Eosinophils Absolute: 0.2 10*3/uL (ref 0.0–0.7)
Eosinophils Relative: 1.8 % (ref 0.0–5.0)
HCT: 37.6 % (ref 36.0–46.0)
Hemoglobin: 12.5 g/dL (ref 12.0–15.0)
Lymphocytes Relative: 19.8 % (ref 12.0–46.0)
Lymphs Abs: 1.6 10*3/uL (ref 0.7–4.0)
MCHC: 33.1 g/dL (ref 30.0–36.0)
MCV: 77.4 fl — ABNORMAL LOW (ref 78.0–100.0)
Monocytes Absolute: 0.5 10*3/uL (ref 0.1–1.0)
Monocytes Relative: 5.7 % (ref 3.0–12.0)
Neutro Abs: 6 10*3/uL (ref 1.4–7.7)
Neutrophils Relative %: 72 % (ref 43.0–77.0)
Platelets: 273 10*3/uL (ref 150.0–400.0)
RBC: 4.87 Mil/uL (ref 3.87–5.11)
RDW: 15 % (ref 11.5–15.5)
WBC: 8.3 10*3/uL (ref 4.0–10.5)

## 2024-07-18 LAB — LIPID PANEL
Cholesterol: 118 mg/dL (ref 28–200)
HDL: 38.3 mg/dL — ABNORMAL LOW
LDL Cholesterol: 59 mg/dL (ref 10–99)
NonHDL: 79.87
Total CHOL/HDL Ratio: 3
Triglycerides: 103 mg/dL (ref 10.0–149.0)
VLDL: 20.6 mg/dL (ref 0.0–40.0)

## 2024-07-18 LAB — COMPREHENSIVE METABOLIC PANEL WITH GFR
ALT: 8 U/L (ref 3–35)
AST: 12 U/L (ref 5–37)
Albumin: 3.8 g/dL (ref 3.5–5.2)
Alkaline Phosphatase: 87 U/L (ref 39–117)
BUN: 8 mg/dL (ref 6–23)
CO2: 29 meq/L (ref 19–32)
Calcium: 9.1 mg/dL (ref 8.4–10.5)
Chloride: 98 meq/L (ref 96–112)
Creatinine, Ser: 0.31 mg/dL — ABNORMAL LOW (ref 0.40–1.20)
GFR: 105.66 mL/min
Glucose, Bld: 85 mg/dL (ref 70–99)
Potassium: 3.9 meq/L (ref 3.5–5.1)
Sodium: 136 meq/L (ref 135–145)
Total Bilirubin: 0.5 mg/dL (ref 0.2–1.2)
Total Protein: 7.1 g/dL (ref 6.0–8.3)

## 2024-07-18 LAB — VITAMIN D 25 HYDROXY (VIT D DEFICIENCY, FRACTURES): VITD: 45.01 ng/mL (ref 30.00–100.00)

## 2024-07-18 LAB — HEMOGLOBIN A1C: Hgb A1c MFr Bld: 5.8 % (ref 4.6–6.5)

## 2024-07-18 LAB — TSH: TSH: 2.74 u[IU]/mL (ref 0.35–5.50)

## 2024-07-22 ENCOUNTER — Ambulatory Visit: Payer: Medicare PPO

## 2024-07-22 ENCOUNTER — Ambulatory Visit

## 2024-07-23 ENCOUNTER — Ambulatory Visit

## 2024-09-20 ENCOUNTER — Encounter: Admitting: Physical Medicine and Rehabilitation

## 2024-10-07 ENCOUNTER — Encounter: Admitting: Physical Medicine and Rehabilitation

## 2025-01-14 ENCOUNTER — Ambulatory Visit: Admitting: Student
